# Patient Record
Sex: Female | Born: 1945 | Race: White | Hispanic: No | Marital: Married | Smoking: Never smoker
Health system: Southern US, Community
[De-identification: ages and names within clinical notes are randomized; demographics above are authoritative.]

## PROBLEM LIST (undated history)

## (undated) DIAGNOSIS — I10 Essential (primary) hypertension: Secondary | ICD-10-CM

## (undated) DIAGNOSIS — I480 Paroxysmal atrial fibrillation: Secondary | ICD-10-CM

## (undated) DIAGNOSIS — N183 Chronic kidney disease, stage 3 unspecified: Secondary | ICD-10-CM

## (undated) DIAGNOSIS — C50919 Malignant neoplasm of unspecified site of unspecified female breast: Secondary | ICD-10-CM

## (undated) DIAGNOSIS — F419 Anxiety disorder, unspecified: Secondary | ICD-10-CM

## (undated) DIAGNOSIS — E7800 Pure hypercholesterolemia, unspecified: Secondary | ICD-10-CM

## (undated) DIAGNOSIS — R4189 Other symptoms and signs involving cognitive functions and awareness: Secondary | ICD-10-CM

## (undated) DIAGNOSIS — F32A Depression, unspecified: Secondary | ICD-10-CM

## (undated) HISTORY — DX: Essential (primary) hypertension: I10

## (undated) HISTORY — DX: Malignant neoplasm of unspecified site of unspecified female breast: C50.919

## (undated) HISTORY — DX: Chronic kidney disease, stage 3 (moderate): N18.3

## (undated) HISTORY — DX: Anxiety disorder, unspecified: F41.9

## (undated) HISTORY — DX: Chronic kidney disease, stage 3 unspecified: N18.30

## (undated) HISTORY — DX: Pure hypercholesterolemia, unspecified: E78.00

## (undated) HISTORY — PX: ANKLE SURGERY: SHX546

## (undated) HISTORY — PX: COLONOSCOPY: SHX174

## (undated) HISTORY — DX: Other symptoms and signs involving cognitive functions and awareness: R41.89

## (undated) HISTORY — DX: Paroxysmal atrial fibrillation: I48.0

---

## 1978-06-28 HISTORY — PX: CHOLECYSTECTOMY: SHX55

## 1983-06-29 HISTORY — PX: ABDOMINAL HYSTERECTOMY: SHX81

## 1995-06-29 DIAGNOSIS — C50919 Malignant neoplasm of unspecified site of unspecified female breast: Secondary | ICD-10-CM

## 1995-06-29 HISTORY — DX: Malignant neoplasm of unspecified site of unspecified female breast: C50.919

## 1995-06-29 HISTORY — PX: BREAST SURGERY: SHX581

## 1998-11-12 ENCOUNTER — Other Ambulatory Visit: Admission: RE | Admit: 1998-11-12 | Discharge: 1998-11-12 | Payer: Self-pay | Admitting: Gynecology

## 2000-01-13 ENCOUNTER — Other Ambulatory Visit: Admission: RE | Admit: 2000-01-13 | Discharge: 2000-01-13 | Payer: Self-pay | Admitting: Gynecology

## 2001-01-18 ENCOUNTER — Other Ambulatory Visit: Admission: RE | Admit: 2001-01-18 | Discharge: 2001-01-18 | Payer: Self-pay | Admitting: Gynecology

## 2001-04-05 ENCOUNTER — Ambulatory Visit (HOSPITAL_COMMUNITY): Admission: RE | Admit: 2001-04-05 | Discharge: 2001-04-05 | Payer: Self-pay | Admitting: Gynecology

## 2001-04-05 ENCOUNTER — Encounter: Payer: Self-pay | Admitting: Gynecology

## 2001-04-13 ENCOUNTER — Ambulatory Visit (HOSPITAL_COMMUNITY): Admission: RE | Admit: 2001-04-13 | Discharge: 2001-04-13 | Payer: Self-pay | Admitting: Gynecology

## 2001-04-13 ENCOUNTER — Encounter: Payer: Self-pay | Admitting: Gynecology

## 2001-09-13 ENCOUNTER — Encounter: Payer: Self-pay | Admitting: Surgery

## 2001-09-13 ENCOUNTER — Ambulatory Visit (HOSPITAL_COMMUNITY): Admission: RE | Admit: 2001-09-13 | Discharge: 2001-09-13 | Payer: Self-pay | Admitting: Surgery

## 2002-01-30 ENCOUNTER — Other Ambulatory Visit: Admission: RE | Admit: 2002-01-30 | Discharge: 2002-01-30 | Payer: Self-pay | Admitting: Gynecology

## 2002-03-16 ENCOUNTER — Ambulatory Visit (HOSPITAL_COMMUNITY): Admission: RE | Admit: 2002-03-16 | Discharge: 2002-03-16 | Payer: Self-pay | Admitting: Surgery

## 2002-03-16 ENCOUNTER — Encounter: Payer: Self-pay | Admitting: Surgery

## 2003-02-15 ENCOUNTER — Other Ambulatory Visit: Admission: RE | Admit: 2003-02-15 | Discharge: 2003-02-15 | Payer: Self-pay | Admitting: Gynecology

## 2004-03-17 ENCOUNTER — Other Ambulatory Visit: Admission: RE | Admit: 2004-03-17 | Discharge: 2004-03-17 | Payer: Self-pay | Admitting: Gynecology

## 2005-03-18 ENCOUNTER — Other Ambulatory Visit: Admission: RE | Admit: 2005-03-18 | Discharge: 2005-03-18 | Payer: Self-pay | Admitting: Gynecology

## 2006-03-22 ENCOUNTER — Other Ambulatory Visit: Admission: RE | Admit: 2006-03-22 | Discharge: 2006-03-22 | Payer: Self-pay | Admitting: Gynecology

## 2007-04-25 ENCOUNTER — Other Ambulatory Visit: Admission: RE | Admit: 2007-04-25 | Discharge: 2007-04-25 | Payer: Self-pay | Admitting: Gynecology

## 2008-06-06 ENCOUNTER — Encounter: Admission: RE | Admit: 2008-06-06 | Discharge: 2008-06-06 | Payer: Self-pay | Admitting: Internal Medicine

## 2008-06-06 ENCOUNTER — Encounter: Payer: Self-pay | Admitting: Women's Health

## 2008-06-06 ENCOUNTER — Other Ambulatory Visit: Admission: RE | Admit: 2008-06-06 | Discharge: 2008-06-06 | Payer: Self-pay | Admitting: Gynecology

## 2008-06-06 ENCOUNTER — Ambulatory Visit: Payer: Self-pay | Admitting: Women's Health

## 2008-10-14 ENCOUNTER — Ambulatory Visit: Payer: Self-pay | Admitting: Women's Health

## 2009-01-16 ENCOUNTER — Encounter: Payer: Self-pay | Admitting: Cardiology

## 2009-03-21 ENCOUNTER — Encounter: Admission: RE | Admit: 2009-03-21 | Discharge: 2009-03-21 | Payer: Self-pay | Admitting: Internal Medicine

## 2009-06-11 ENCOUNTER — Other Ambulatory Visit: Admission: RE | Admit: 2009-06-11 | Discharge: 2009-06-11 | Payer: Self-pay | Admitting: Gynecology

## 2009-06-11 ENCOUNTER — Ambulatory Visit: Payer: Self-pay | Admitting: Women's Health

## 2010-03-05 ENCOUNTER — Ambulatory Visit: Payer: Self-pay | Admitting: Women's Health

## 2010-04-08 ENCOUNTER — Ambulatory Visit: Payer: Self-pay | Admitting: Women's Health

## 2010-06-12 ENCOUNTER — Ambulatory Visit: Payer: Self-pay | Admitting: Women's Health

## 2011-05-27 ENCOUNTER — Emergency Department (HOSPITAL_COMMUNITY): Payer: Medicare Other

## 2011-05-27 ENCOUNTER — Inpatient Hospital Stay (HOSPITAL_COMMUNITY): Payer: Medicare Other

## 2011-05-27 ENCOUNTER — Inpatient Hospital Stay (HOSPITAL_COMMUNITY)
Admission: EM | Admit: 2011-05-27 | Discharge: 2011-05-31 | DRG: 690 | Disposition: A | Discharge status: Home / Self Care | Payer: Medicare Other | Attending: Family Medicine | Admitting: Family Medicine

## 2011-05-27 ENCOUNTER — Encounter (HOSPITAL_COMMUNITY): Payer: Self-pay | Admitting: *Deleted

## 2011-05-27 DIAGNOSIS — F329 Major depressive disorder, single episode, unspecified: Secondary | ICD-10-CM | POA: Diagnosis present

## 2011-05-27 DIAGNOSIS — E860 Dehydration: Secondary | ICD-10-CM

## 2011-05-27 DIAGNOSIS — Z853 Personal history of malignant neoplasm of breast: Secondary | ICD-10-CM

## 2011-05-27 DIAGNOSIS — N12 Tubulo-interstitial nephritis, not specified as acute or chronic: Principal | ICD-10-CM | POA: Diagnosis present

## 2011-05-27 DIAGNOSIS — N179 Acute kidney failure, unspecified: Secondary | ICD-10-CM | POA: Diagnosis present

## 2011-05-27 DIAGNOSIS — R51 Headache: Secondary | ICD-10-CM | POA: Diagnosis present

## 2011-05-27 DIAGNOSIS — K5900 Constipation, unspecified: Secondary | ICD-10-CM | POA: Diagnosis present

## 2011-05-27 DIAGNOSIS — E119 Type 2 diabetes mellitus without complications: Secondary | ICD-10-CM | POA: Diagnosis present

## 2011-05-27 DIAGNOSIS — E876 Hypokalemia: Secondary | ICD-10-CM

## 2011-05-27 DIAGNOSIS — F3289 Other specified depressive episodes: Secondary | ICD-10-CM | POA: Diagnosis present

## 2011-05-27 DIAGNOSIS — R112 Nausea with vomiting, unspecified: Secondary | ICD-10-CM

## 2011-05-27 DIAGNOSIS — I10 Essential (primary) hypertension: Secondary | ICD-10-CM | POA: Diagnosis present

## 2011-05-27 DIAGNOSIS — E785 Hyperlipidemia, unspecified: Secondary | ICD-10-CM | POA: Diagnosis present

## 2011-05-27 DIAGNOSIS — A498 Other bacterial infections of unspecified site: Secondary | ICD-10-CM | POA: Diagnosis present

## 2011-05-27 DIAGNOSIS — R42 Dizziness and giddiness: Secondary | ICD-10-CM | POA: Diagnosis present

## 2011-05-27 DIAGNOSIS — N390 Urinary tract infection, site not specified: Secondary | ICD-10-CM

## 2011-05-27 DIAGNOSIS — Z23 Encounter for immunization: Secondary | ICD-10-CM

## 2011-05-27 DIAGNOSIS — F411 Generalized anxiety disorder: Secondary | ICD-10-CM | POA: Diagnosis present

## 2011-05-27 LAB — URINALYSIS, ROUTINE W REFLEX MICROSCOPIC
Nitrite: NEGATIVE
Specific Gravity, Urine: 1.008 (ref 1.005–1.030)
Urobilinogen, UA: 0.2 mg/dL (ref 0.0–1.0)
pH: 5.5 (ref 5.0–8.0)

## 2011-05-27 LAB — CBC
MCH: 30 pg (ref 26.0–34.0)
Platelets: 513 10*3/uL — ABNORMAL HIGH (ref 150–400)
RBC: 3 MIL/uL — ABNORMAL LOW (ref 3.87–5.11)
RDW: 13.7 % (ref 11.5–15.5)
WBC: 22.8 10*3/uL — ABNORMAL HIGH (ref 4.0–10.5)

## 2011-05-27 LAB — DIFFERENTIAL
Basophils Absolute: 0.2 10*3/uL — ABNORMAL HIGH (ref 0.0–0.1)
Eosinophils Relative: 1 % (ref 0–5)
Lymphs Abs: 1.1 10*3/uL (ref 0.7–4.0)
Monocytes Absolute: 1.4 10*3/uL — ABNORMAL HIGH (ref 0.1–1.0)
Monocytes Relative: 6 % (ref 3–12)
Neutrophils Relative %: 87 % — ABNORMAL HIGH (ref 43–77)

## 2011-05-27 LAB — COMPREHENSIVE METABOLIC PANEL
Albumin: 2.3 g/dL — ABNORMAL LOW (ref 3.5–5.2)
Alkaline Phosphatase: 90 U/L (ref 39–117)
BUN: 38 mg/dL — ABNORMAL HIGH (ref 6–23)
Calcium: 8.9 mg/dL (ref 8.4–10.5)
GFR calc Af Amer: 23 mL/min — ABNORMAL LOW (ref >90)
Glucose, Bld: 130 mg/dL — ABNORMAL HIGH (ref 70–99)
Potassium: 3.3 mEq/L — ABNORMAL LOW (ref 3.5–5.1)
Total Protein: 6.7 g/dL (ref 6.0–8.3)

## 2011-05-27 LAB — URINE MICROSCOPIC-ADD ON

## 2011-05-27 LAB — LIPASE, BLOOD: Lipase: 63 U/L — ABNORMAL HIGH (ref 11–59)

## 2011-05-27 LAB — CARDIAC PANEL(CRET KIN+CKTOT+MB+TROPI)
CK, MB: 3.2 ng/mL (ref 0.3–4.0)
Total CK: 91 U/L (ref 7–177)
Troponin I: <0.3 ng/mL (ref <0.30)

## 2011-05-27 MED ORDER — SODIUM CHLORIDE 0.9 % IV BOLUS (SEPSIS): 1000.0000 mL | Freq: Once | INTRAVENOUS | Status: DC

## 2011-05-27 MED ORDER — VITAMIN D3 25 MCG (1000 UNIT) PO TABS
1000.0000 [IU] | ORAL_TABLET | Freq: Every day | ORAL | Status: DC
  Administered 2011-05-28 – 2011-05-31 (×4): 1000 [IU] via ORAL
  Filled 2011-05-27 (×4): qty 1

## 2011-05-27 MED ORDER — CEFTRIAXONE SODIUM 1 G IJ SOLR
1.0000 g | INTRAMUSCULAR | Status: DC
  Administered 2011-05-28 – 2011-05-31 (×4): 1 g via INTRAVENOUS
  Filled 2011-05-27 (×4): qty 10

## 2011-05-27 MED ORDER — ONDANSETRON HCL 4 MG/2ML IJ SOLN
4.0000 mg | Freq: Once | INTRAMUSCULAR | Status: AC
  Administered 2011-05-27: 4 mg via INTRAVENOUS
  Filled 2011-05-27: qty 2

## 2011-05-27 MED ORDER — ZOLPIDEM TARTRATE 5 MG PO TABS
5.0000 mg | ORAL_TABLET | Freq: Every day | ORAL | Status: DC
  Administered 2011-05-27 – 2011-05-30 (×4): 5 mg via ORAL
  Filled 2011-05-27: qty 2
  Filled 2011-05-27 (×3): qty 1

## 2011-05-27 MED ORDER — ONDANSETRON HCL 4 MG/2ML IJ SOLN
4.0000 mg | Freq: Three times a day (TID) | INTRAMUSCULAR | Status: DC | PRN
  Administered 2011-05-27: 4 mg via INTRAVENOUS
  Filled 2011-05-27: qty 2

## 2011-05-27 MED ORDER — ACETAMINOPHEN 325 MG PO TABS
650.0000 mg | ORAL_TABLET | Freq: Four times a day (QID) | ORAL | Status: DC | PRN
  Administered 2011-05-27 – 2011-05-29 (×4): 650 mg via ORAL
  Filled 2011-05-27 (×2): qty 2
  Filled 2011-05-27 (×2): qty 1
  Filled 2011-05-27: qty 2

## 2011-05-27 MED ORDER — VITAMIN D3 25 MCG (1000 UT) PO CAPS: 1.0000 | ORAL_CAPSULE | Freq: Every day | ORAL | Status: DC

## 2011-05-27 MED ORDER — HEPARIN SODIUM (PORCINE) 5000 UNIT/ML IJ SOLN
5000.0000 [IU] | Freq: Three times a day (TID) | INTRAMUSCULAR | Status: DC
  Administered 2011-05-27 – 2011-05-31 (×11): 5000 [IU] via SUBCUTANEOUS
  Filled 2011-05-27 (×14): qty 1

## 2011-05-27 MED ORDER — PANTOPRAZOLE SODIUM 40 MG PO TBEC
40.0000 mg | DELAYED_RELEASE_TABLET | Freq: Every day | ORAL | Status: DC
  Administered 2011-05-28: 40 mg via ORAL
  Filled 2011-05-27: qty 1

## 2011-05-27 MED ORDER — ONDANSETRON HCL 4 MG/2ML IJ SOLN
4.0000 mg | Freq: Four times a day (QID) | INTRAMUSCULAR | Status: DC | PRN
  Administered 2011-05-27 – 2011-05-28 (×3): 4 mg via INTRAVENOUS
  Filled 2011-05-27 (×3): qty 2

## 2011-05-27 MED ORDER — SODIUM CHLORIDE 0.9 % IV SOLN
INTRAVENOUS | Status: DC
  Administered 2011-05-27 – 2011-05-28 (×3): via INTRAVENOUS

## 2011-05-27 MED ORDER — ZOLPIDEM TARTRATE 5 MG PO TABS: 10.0000 mg | ORAL_TABLET | Freq: Every day | ORAL | Status: DC

## 2011-05-27 MED ORDER — HYDROMORPHONE HCL PF 1 MG/ML IJ SOLN
1.0000 mg | INTRAMUSCULAR | Status: DC | PRN
  Administered 2011-05-27: 1 mg via INTRAVENOUS
  Filled 2011-05-27: qty 1

## 2011-05-27 MED ORDER — SODIUM CHLORIDE 0.9 % IV BOLUS (SEPSIS)
1000.0000 mL | Freq: Once | INTRAVENOUS | Status: AC
  Administered 2011-05-27: 1000 mL via INTRAVENOUS

## 2011-05-27 MED ORDER — ONDANSETRON HCL 4 MG PO TABS: 4.0000 mg | ORAL_TABLET | Freq: Four times a day (QID) | ORAL | Status: DC | PRN

## 2011-05-27 MED ORDER — ALPRAZOLAM 0.5 MG PO TABS
0.5000 mg | ORAL_TABLET | Freq: Every evening | ORAL | Status: DC | PRN
  Filled 2011-05-27: qty 1

## 2011-05-27 MED ORDER — MORPHINE SULFATE 2 MG/ML IJ SOLN
2.0000 mg | Freq: Once | INTRAMUSCULAR | Status: AC
  Administered 2011-05-27: 2 mg via INTRAVENOUS
  Filled 2011-05-27: qty 1

## 2011-05-27 MED ORDER — DEXTROSE 5 % IV SOLN
1.0000 g | Freq: Once | INTRAVENOUS | Status: AC
  Administered 2011-05-27: 1 g via INTRAVENOUS
  Filled 2011-05-27: qty 10

## 2011-05-27 MED ORDER — SODIUM CHLORIDE 0.9 % IV BOLUS (SEPSIS)
500.0000 mL | Freq: Once | INTRAVENOUS | Status: AC
  Administered 2011-05-27: 11:00:00 via INTRAVENOUS

## 2011-05-27 MED ORDER — BUPROPION HCL ER (XL) 150 MG PO TB24
150.0000 mg | ORAL_TABLET | Freq: Every day | ORAL | Status: DC
  Administered 2011-05-28 – 2011-05-31 (×4): 150 mg via ORAL
  Filled 2011-05-27 (×4): qty 1

## 2011-05-27 MED ORDER — INSULIN ASPART 100 UNIT/ML ~~LOC~~ SOLN
0.0000 [IU] | Freq: Three times a day (TID) | SUBCUTANEOUS | Status: DC
  Administered 2011-05-28 – 2011-05-31 (×4): 1 [IU] via SUBCUTANEOUS
  Filled 2011-05-27: qty 3

## 2011-05-27 MED ORDER — SODIUM CHLORIDE 0.9 % IV SOLN
INTRAVENOUS | Status: AC
  Administered 2011-05-27: 17:00:00 via INTRAVENOUS

## 2011-05-27 MED ORDER — LORATADINE 10 MG PO TABS
10.0000 mg | ORAL_TABLET | Freq: Every day | ORAL | Status: DC
  Administered 2011-05-28 – 2011-05-31 (×4): 10 mg via ORAL
  Filled 2011-05-27 (×4): qty 1

## 2011-05-27 NOTE — H&P (Signed)
Kathryn Wall is an 65 y.o. female.    Chief Complaint: Abdominal pain, N/V x 10 days  HPI: Kathryn Wall is a 65 yo F with PMH of HTN, DM, HLD, anxiety and breast cancer who presented to the ED for a 10 day history of nausea, vomiting and abdominal pain. Kathryn Wall states this all started on 05/18/11 after eating BBQ. (Kathryn Wall shared the meal with Kathryn and has had no GI symptoms.) Kathryn Wall states when it started, she was vomiting multiple times per day, but that has improved and now she only vomits when she eats or drinks something. She has not been able to tolerate anything PO. She denies dark, bilious or bloody emesis. She also reports a cough that may or may not be associated with the vomiting. It has also been going on for a few weeks and she does have post-tussive emesis from time to time. Kathryn Wall also complains of dizziness for the last few months that is worse with position. She has not had any true syncopal episodes, but does report pre-syncope especially when she "stands up too fast."  For Kathryn current symptoms, Kathryn Wall was seen by Kathryn PCP Dr. [name] at [facility] earlier this week. At that time, she had an elevated WBC, elevated Creat and UA that indicated UTI. She was given fluids in the office and started on Cipro for Kathryn UTI. She still had no improvement and came to the ED for further evaluation.  In the ED after arrival, she was found to be orthostatic. Kathryn HR was in the low 100's. WBC of 23, UA showed + bacteria and therefore she was given one dose of Rocephin. Due to dehydration and 10 days of pain, Family medicine was called for admission. On ROS, she endorsed right sided HA, congestion, sore throat, cough, N/V, abd pain, dysuria and chronic leg pain.   Past Medical History  Diagnosis Date  . Hypertension   . High cholesterol   . Diabetes mellitus     TYPE II  . Breast cancer 1997    AGE 15, BRCA 1 NEGATIVE 2. 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      Past Surgical History  Procedure Date  . Cholecystectomy 1980  . Breast surgery 1997    RIGHT BREAST LUMPECTOMY  . Abdominal hysterectomy 1985    TAH    Family History  Problem Relation Age of Onset  . Cancer Mother     COLON  . Hypertension Father   . Heart disease Father    Social History:  reports that she has never smoked. She does not have any smokeless tobacco history on file. She reports that she drinks alcohol. She reports that she does not use illicit drugs.  Allergies:  Allergies  Allergen Reactions  . Allergen (A-B Ear Drops)   . Cymbalta (Duloxetine Hcl)   . Other     SENSITIVE TO ANTIBIOTICS    Medications Prior to Admission  Medication Dose Route Frequency Provider Last Rate Last Dose  . 0.9 %  sodium chloride infusion   Intravenous STAT Glynn Octave, MD      . cefTRIAXone (ROCEPHIN) 1 g in dextrose 5 % 50 mL IVPB  1 g Intravenous Once Glynn Octave, MD   1 g at 05/27/11 1310  . HYDROmorphone (DILAUDID) injection 1 mg  1 mg Intravenous Q4H PRN Glynn Octave, MD   1 mg at 05/27/11 1630  . morphine 2 MG/ML injection 2 mg  2 mg Intravenous Once Na  Dierdre Searles, MD   2 mg at 05/27/11 1115  . ondansetron (ZOFRAN) injection 4 mg  4 mg Intravenous Once Na Li, MD   4 mg at 05/27/11 1115  . ondansetron (ZOFRAN) injection 4 mg  4 mg Intravenous Q8H PRN Glynn Octave, MD   4 mg at 05/27/11 1630  . sodium chloride 0.9 % bolus 1,000 mL  1,000 mL Intravenous Once Glynn Octave, MD   1,000 mL at 05/27/11 1309  . sodium chloride 0.9 % bolus 500 mL  500 mL Intravenous Once Na Li, MD       Medications Prior to Admission  Medication Sig Dispense Refill  . buPROPion (WELLBUTRIN XL) 150 MG 24 hr tablet Take 150 mg by mouth daily.        . metFORMIN (GLUCOPHAGE) 500 MG tablet Take 500 mg by mouth 2 (two) times daily with a meal.          Results for orders placed during the hospital encounter of 05/27/11 (from the past 48 hour(s))  URINALYSIS, ROUTINE W REFLEX MICROSCOPIC      Status: Abnormal   Collection Time   05/27/11 11:13 AM      Component Value Range Comment   Color, Urine YELLOW  YELLOW     APPearance HAZY (*) CLEAR     Specific Gravity, Urine 1.008  1.005 - 1.030     pH 5.5  5.0 - 8.0     Glucose, UA NEGATIVE  NEGATIVE (mg/dL)    Hgb urine dipstick MODERATE (*) NEGATIVE     Bilirubin Urine NEGATIVE  NEGATIVE     Ketones, ur NEGATIVE  NEGATIVE (mg/dL)    Protein, ur NEGATIVE  NEGATIVE (mg/dL)    Urobilinogen, UA 0.2  0.0 - 1.0 (mg/dL)    Nitrite NEGATIVE  NEGATIVE     Leukocytes, UA SMALL (*) NEGATIVE    URINE MICROSCOPIC-ADD ON     Status: Abnormal   Collection Time   05/27/11 11:13 AM      Component Value Range Comment   Squamous Epithelial / LPF RARE  RARE     WBC, UA 7-10  <3 (WBC/hpf)    RBC / HPF 3-6  <3 (RBC/hpf)    Bacteria, UA FEW (*) RARE     Casts WBC CAST (*) NEGATIVE    CBC     Status: Abnormal   Collection Time   05/27/11 11:46 AM      Component Value Range Comment   WBC 22.8 (*) 4.0 - 10.5 (K/uL)    RBC 3.00 (*) 3.87 - 5.11 (MIL/uL)    Hemoglobin 9.0 (*) 12.0 - 15.0 (g/dL)    HCT 16.1 (*) 09.6 - 46.0 (%)    MCV 86.0  78.0 - 100.0 (fL)    MCH 30.0  26.0 - 34.0 (pg)    MCHC 34.9  30.0 - 36.0 (g/dL)    RDW 04.5  40.9 - 81.1 (%)    Platelets 513 (*) 150 - 400 (K/uL)   DIFFERENTIAL     Status: Abnormal   Collection Time   05/27/11 11:46 AM      Component Value Range Comment   Neutrophils Relative 87 (*) 43 - 77 (%)    Lymphocytes Relative 5 (*) 12 - 46 (%)    Monocytes Relative 6  3 - 12 (%)    Eosinophils Relative 1  0 - 5 (%)    Basophils Relative 1  0 - 1 (%)    Neutro Abs 19.9 (*) 1.7 -  7.7 (K/uL)    Lymphs Abs 1.1  0.7 - 4.0 (K/uL)    Monocytes Absolute 1.4 (*) 0.1 - 1.0 (K/uL)    Eosinophils Absolute 0.2  0.0 - 0.7 (K/uL)    Basophils Absolute 0.2 (*) 0.0 - 0.1 (K/uL)    RBC Morphology TARGET CELLS      WBC Morphology TOXIC GRANULATION   MILD LEFT SHIFT (1-5% METAS, OCC MYELO, OCC BANDS)   Smear Review LARGE  PLATELETS PRESENT     COMPREHENSIVE METABOLIC PANEL     Status: Abnormal   Collection Time   05/27/11 11:46 AM      Component Value Range Comment   Sodium 131 (*) 135 - 145 (mEq/L)    Potassium 3.3 (*) 3.5 - 5.1 (mEq/L)    Chloride 95 (*) 96 - 112 (mEq/L)    CO2 20  19 - 32 (mEq/L)    Glucose, Bld 130 (*) 70 - 99 (mg/dL)    BUN 38 (*) 6 - 23 (mg/dL)    Creatinine, Ser 9.14 (*) 0.50 - 1.10 (mg/dL)    Calcium 8.9  8.4 - 10.5 (mg/dL)    Total Protein 6.7  6.0 - 8.3 (g/dL)    Albumin 2.3 (*) 3.5 - 5.2 (g/dL)    AST 26  0 - 37 (U/L)    ALT 32  0 - 35 (U/L)    Alkaline Phosphatase 90  39 - 117 (U/L)    Total Bilirubin 0.6  0.3 - 1.2 (mg/dL)    GFR calc non Af Amer 20 (*) >90 (mL/min)    GFR calc Af Amer 23 (*) >90 (mL/min)   LIPASE, BLOOD     Status: Abnormal   Collection Time   05/27/11 11:46 AM      Component Value Range Comment   Lipase 63 (*) 11 - 59 (U/L)   CARDIAC PANEL(CRET KIN+CKTOT+MB+TROPI)     Status: Normal   Collection Time   05/27/11 11:49 AM      Component Value Range Comment   Total CK 91  7 - 177 (U/L)    CK, MB 3.2  0.3 - 4.0 (ng/mL)    Troponin I <0.30  <0.30 (ng/mL)    Relative Index RELATIVE INDEX IS INVALID  0.0 - 2.5    LACTIC ACID, PLASMA     Status: Normal   Collection Time   05/27/11 11:51 AM      Component Value Range Comment   Lactic Acid, Venous 0.9  0.5 - 2.2 (mmol/L)    Ct Abdomen Pelvis Wo Contrast  05/27/2011  *RADIOLOGY REPORT*  Clinical Data: Abnormal labs, cramping, nausea, vomiting, shortness of breath, productive cough, dizziness, recent diagnosis of UTI; past history of hypertension, diabetes, hypercholesterolemia, breast cancer  CT ABDOMEN AND PELVIS WITHOUT CONTRAST  Technique:  Multidetector CT imaging of the abdomen and pelvis was performed following the standard protocol without intravenous contrast. Sagittal and coronal MPR images reconstructed from axial data set.  Comparison: 01/07/2006  Findings: Minimal dependent atelectasis at lung  bases. Low attenuation of circulating blood question anemia. Kidneys appear enlarged with thickened cortex versus prior study. Minimal perinephric edema particulate on the right. Within limits of a nonenhanced exam, no additional abnormalities of the liver, spleen, pancreas, kidneys, or adrenal glands. Two splenules adjacent to splenic hilum, stable. Normal caliber ureters without calcification.  Unremarkable bladder. Uterus surgically absent with normal sized ovaries. Scattered uncomplicated colonic diverticula. Appendix not visualized. Stomach and bowel loops otherwise grossly normal appearance. No mass, adenopathy, free fluid  or hernia. Degenerative disc disease changes L3-L4 without acute osseous findings.  IMPRESSION: Bilateral renal cortical thickening and minimal perihilar edema raising question of urinary tract infection/pyelonephritis; recommend correlation with urinalysis. Question anemia. No additional significant intra abdominal or intrapelvic abnormalities.  Original Report Authenticated By: Lollie Marrow, M.D.   Dg Chest 2 View  05/27/2011  *RADIOLOGY REPORT*  Clinical Data: Nausea, vomiting, abdominal pain.  CHEST - 2 VIEW  Comparison: 06/06/2008  Findings: Heart is normal size.  Lungs are clear.  No effusions or acute bony abnormality.  Slight peribronchial thickening.  IMPRESSION: Slight bronchitic changes.  Original Report Authenticated By: Cyndie Chime, M.D.    Review of Systems  Constitutional: Positive for fever, chills and weight loss.  HENT: Positive for congestion and sore throat.   Respiratory: Positive for cough. Negative for hemoptysis and sputum production.   Cardiovascular: Negative for chest pain.  Gastrointestinal: Positive for nausea, vomiting and abdominal pain.  Genitourinary: Positive for dysuria and frequency.  Musculoskeletal: Negative for myalgias.  Skin: Negative for rash.  Neurological: Positive for dizziness, weakness and headaches.  Psychiatric/Behavioral:  The Kathryn Wall is nervous/anxious.   All other systems reviewed and are negative.    Blood pressure 130/86, pulse 94, temperature 99.8 F (37.7 C), temperature source Oral, resp. rate 18, SpO2 97.00%. Physical Exam  Gen: NAD Psych: fully alert and oriented, engaged, appropriate HEENT   Eyes: normal   Nose: no rhinorrhea or congestion   Oropharynx: no lesions, dry MM   Neck: no LAD CV: RRR, no m/r/g Pulm: CTAB, no w/r/r, occasional coarse breath sound right lower lobe that resolves with cough Abd: NABS, soft, ND, mild tenderness without guarding or rebound periumbilical region Back: no spinal tenderness or CVA tenderness Ext: no swelling, tenderness, erythema Skin: 3-4 sec capillary refill  Assessment/Plan Kathryn Wall is a 65 yo F with PMH of HTN, DM, HLD, anxiety and breast cancer who presented to the ED for a 10 day history of nausea, vomiting and abdominal pain.   Will admit to floor bed for observation.   Nausea/vomiting/abdominal pain CT abdomen unremarkable but Kathryn Wall does have elevated WBC along with Kathryn symptoms. -May be due to questionable UTI. No UCx done. Will get now although has been on ciprofloxacin x 3 days and received dose of CTZ in ED. -Symptoms likely started from gastroenteritis. Lipase mildly elevated. Will repeat in the AM.  -Will check CT head to evaluate for bleed to nausea/vomiting/lightheadedness/headache  Headache -Will f/u CT head -Tylenol prn  Lightheadedness Positive orthostatics. Likely due to dehydration currently although has been complaining of this for the past 2 years. -Received 1.5 L bolus in the ED. Still appears dry. Will give 1 L NS now and continue fluids at maintenance -Will repeat orthostatics once improved  UTI Diagnosed at PCP, started on Cipro. -Rocephin x1 in ED. Will continue -Awaiting gram stain and urine culture  Cough CXR shows no pneumonia, no productive cough. But WBC concerning. -Will repeat CXR in the AM  HTN -Will hold  home ARB and ACEi for now due to positive orthostatics and elevated Cr  AKI -Likely due to dehydration. Will give fluids.  DM -Will hold metformin for now.  -SSI  Psych -We will continue home Xanax qhs, Wellbutrin, Ambien qhs   HLD -Will hold fenofibrate and Zocor for now  FEN/GI -Will give bolus and maintenance fluids -Will give clears. Zofran prn.   Dispo Pending clinical improvement.  Code FULL   Sherriann Szuch 05/27/2011, 4:34 PM  Etta Quill.  Madolyn Frieze, PGY2

## 2011-05-27 NOTE — ED Notes (Signed)
Patient c/o nausea vomiting onset last Weds.  Was seen by her pvt. MD on Wed had labs drawn and started on Cipro for UTI, patient continues to complaint of nausea and vomiting was seen in the office today for follow up and was told to come to the ed for further eval for abnormal labs Elevated Bun. Cr. And wbc.

## 2011-05-27 NOTE — ED Provider Notes (Signed)
I saw and evaluated the patient, reviewed the resident's note and I agree with the findings and plan.  10 days of nausea, vomiting, vague abdominal discomfort.  No diarrhea.  Abdomen soft. Mild L sided tenderness.  Recently treated for UTI  Glynn Octave, MD 05/27/11 1320

## 2011-05-27 NOTE — ED Notes (Signed)
Date: 05/27/2011  Rate: 94  Rhythm: normal sinus rhythm  QRS Axis: normal  Intervals: normal  ST/T Wave abnormalities: normal  Conduction Disutrbances:none  Narrative Interpretation:   Old EKG Reviewed: none available    Glynn Octave, MD 05/27/11 1351

## 2011-05-27 NOTE — ED Notes (Signed)
md at bedside.  No distress noted at present.

## 2011-05-27 NOTE — ED Notes (Signed)
Pt states has had n/v cough and abd soreness for several days, states has been treated for uti by her Md.

## 2011-05-27 NOTE — Progress Notes (Signed)
FPTS Attending Note  I interviewed and examined [name] and reviewed their tests and x-rays.  I discussed with Drs.  Oh Park and Erwin and reviewed their note for today.  I agree with their assessment and plan.     Most consistent with incompletely treated UTI with ow normal abdomen CT and chest xray findings.  Treat with broader spectrum antibiotics reculture urine and gram stain.   Will follow for resolution of other symptoms.   Elevated crt likely due to dehydration.  Hydrate and monitor  Kathryn Wall L

## 2011-05-27 NOTE — ED Provider Notes (Signed)
History     CSN: [id number] Arrival date & time: 05/27/2011 10:12 AM   None     Chief Complaint  Patient presents with  . Abnormal Lab    (Consider location/radiation/quality/duration/timing/severity/associated sxs/prior treatment) HPI  This is a 65 year old female with PMH of HTN, DM type II, breast cancer with lumpectomy and radiation, TAH, and Cholecystectomy who presents to the ED with vomiting. The history is provided by patient. Patient reports that she started to have intermittent nausea and vomiting 10 days ago after she ate some barbecue with family, stomach content without hemoemesis, accompanied with generalized abdominal aching and cramping. Denies diarrhea.  She is the only one who is sick and her other family members are ok. She also report some nonproductive cough and mild shortness of breath associated with her vomiting.  Pt states that she felt dizziness and fell 4-5 times last week without serious injury, denies seizure activities or LOC. She went to see her PCP 3 days ago for evaluation. She was given 2 liters of IV fluid and treated with Cipro after Positive UTI noted on her UA.  She states that her symptoms are not getting better, and She continues to have intermittent abdominal cramping, nausea and vomiting. Patient reports one episode of fever 101.0 last week. Denies sick contact or recent travel. Denies headache or sore throat. No chest pain, chest pressure or palpitation. No melena, diarrhea or incontinence. No muscle weakness.                    Denies depression. No appetite or weight changes.   Of note, patient states that she has had intermittent nausea and vomiting in the past whenever she is stressful. And she has some family and financial stress for one year and is really stressful lately.   Past Medical History  Diagnosis Date  . Hypertension   . High cholesterol   . Diabetes mellitus     TYPE II  . Breast cancer 1997    AGE 26, BRCA 1 NEGATIVE 2.  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     Past Surgical History  Procedure Date  . Cholecystectomy 1980  . Breast surgery 1997    RIGHT BREAST LUMPECTOMY  . Abdominal hysterectomy 1985    TAH    Family History  Problem Relation Age of Onset  . Cancer Mother     COLON  . Hypertension Father   . Heart disease Father     History  Substance Use Topics  . Smoking status: Never Smoker   . Smokeless tobacco: Not on file  . Alcohol Use: Yes    OB History    Grav Para Term Preterm Abortions TAB SAB Ect Mult Living                  Review of Systems  see HPI  Allergies  Allergen; Cymbalta; and Other  Home Medications   Current Outpatient Rx  Name Route Sig Dispense Refill  . ALPRAZOLAM 0.5 MG PO TABS Oral Take 0.5 mg by mouth at bedtime as needed.      Marland Kitchen CIPROFLOXACIN HCL 500 MG PO TABS Oral Take 500 mg by mouth 2 (two) times daily.      Marland Kitchen ESCITALOPRAM OXALATE 20 MG PO TABS Oral Take 20 mg by mouth daily.      Marland Kitchen PANTOPRAZOLE SODIUM 40 MG PO TBEC Oral Take 40 mg by mouth daily.      Marland Kitchen SIMVASTATIN 40  MG PO TABS Oral Take 40 mg by mouth at bedtime.      . ASPIRIN 81 MG PO TABS Oral Take 81 mg by mouth daily.      . BUPROPION HCL ER (XL) 150 MG PO TB24 Oral Take 150 mg by mouth daily.      . STOOL SOFTENER PO Oral Take by mouth as needed.      . TRICOR PO Oral Take 145 mg by mouth.     Marland Kitchen HYDROCHLOROTHIAZIDE 25 MG PO TABS Oral Take 25 mg by mouth daily.      Marland Kitchen METFORMIN HCL 500 MG PO TABS Oral Take 500 mg by mouth 2 (two) times daily with a meal.      . ZOLPIDEM TARTRATE ER 12.5 MG PO TBCR Oral Take 12.5 mg by mouth at bedtime as needed.        BP 122/74  Pulse 104  Temp(Src) 99.8 F (37.7 C) (Oral)  Resp 20  SpO2 94%  Physical Exam General: NAD. alert, well-developed, and cooperative to examination.  Head: normocephalic and atraumatic.  Eyes: vision grossly intact, pupils equal, pupils round, pupils reactive to light, no injection and anicteric.    Mouth: pharynx pink and dry, no erythema, and no exudates. Mucous membrane dry Neck: supple, full ROM, no thyromegaly, no JVD, and no carotid bruits.  Lungs: normal respiratory effort, no accessory muscle use, normal breath sounds, no crackles, and no wheezes. Heart: normal rate, regular rhythm, no murmur, no gallop, and no rub.  Abdomen: soft, normal bowel sounds, no distention. Epigastric area tenderness noted, no guarding, no rebound tenderness, no hepatomegaly, and no splenomegaly.  Msk: no joint swelling, no joint warmth, and no redness over joints.  Pulses: 2+ DP/PT pulses bilaterally Extremities: No cyanosis, clubbing, edema Neurologic: alert & oriented X3, cranial nerves II-XII intact, strength normal in all extremities, sensation intact to light touch, and gait normal.  Skin: turgor normal and no rashes.  Psych: Oriented X3, memory intact for recent and remote, normally interactive, good eye contact, not anxious appearing, and not depressed appearing.   ED Course  Procedures (including critical care time)  Date: 05/27/2011  Rate: 94  Rhythm: normal sinus rhythm  QRS Axis: normal  Intervals: normal  ST/T Wave abnormalities: normal  Conduction Disutrbances:none  Narrative Interpretation:   Old EKG Reviewed: none available     MDM  1. Vomiting and abdominal pain. Pt presents to the ED with 10 days of intermittent nausea, vomiting and abdominal pain. Denies diarrhea. Recent UTI diagnosed and treated with Cipro 3 days ago.  - will check her cardiac markers given her multiple risk factors for CAD - will check her lipase function - will treat her symptoms.        Dede Query, MD 05/27/11 1139  Dede Query, MD 05/27/11 1243

## 2011-05-28 ENCOUNTER — Inpatient Hospital Stay (HOSPITAL_COMMUNITY): Payer: Medicare Other

## 2011-05-28 ENCOUNTER — Other Ambulatory Visit: Payer: Self-pay

## 2011-05-28 LAB — BASIC METABOLIC PANEL
CO2: 22 mEq/L (ref 19–32)
Calcium: 8.1 mg/dL — ABNORMAL LOW (ref 8.4–10.5)
Creatinine, Ser: 2.07 mg/dL — ABNORMAL HIGH (ref 0.50–1.10)
GFR calc Af Amer: 27 mL/min — ABNORMAL LOW (ref >90)
GFR calc non Af Amer: 24 mL/min — ABNORMAL LOW (ref >90)
Sodium: 136 mEq/L (ref 135–145)

## 2011-05-28 LAB — CBC
Platelets: 478 10*3/uL — ABNORMAL HIGH (ref 150–400)
RBC: 2.5 MIL/uL — ABNORMAL LOW (ref 3.87–5.11)
RDW: 14.1 % (ref 11.5–15.5)
WBC: 15.6 10*3/uL — ABNORMAL HIGH (ref 4.0–10.5)

## 2011-05-28 LAB — SODIUM, URINE, RANDOM: Sodium, Ur: 96 mEq/L

## 2011-05-28 LAB — GLUCOSE, CAPILLARY
Glucose-Capillary: 125 mg/dL — ABNORMAL HIGH (ref 70–99)
Glucose-Capillary: 111 mg/dL — ABNORMAL HIGH (ref 70–99)
Glucose-Capillary: 103 mg/dL — ABNORMAL HIGH (ref 70–99)

## 2011-05-28 LAB — TSH: TSH: 3.178 u[IU]/mL (ref 0.350–4.500)

## 2011-05-28 LAB — LIPASE, BLOOD: Lipase: 56 U/L (ref 11–59)

## 2011-05-28 LAB — HEMOGLOBIN A1C
Hgb A1c MFr Bld: 6.5 % — ABNORMAL HIGH (ref <5.7)
Mean Plasma Glucose: 140 mg/dL — ABNORMAL HIGH (ref <117)

## 2011-05-28 LAB — CREATININE, URINE, RANDOM: Creatinine, Urine: 23.13 mg/dL

## 2011-05-28 MED ORDER — PROMETHAZINE HCL 25 MG/ML IJ SOLN
25.0000 mg | Freq: Four times a day (QID) | INTRAMUSCULAR | Status: DC | PRN
  Administered 2011-05-28 – 2011-05-29 (×6): 25 mg via INTRAVENOUS
  Filled 2011-05-28 (×6): qty 1

## 2011-05-28 MED ORDER — ALPRAZOLAM 0.5 MG PO TABS
0.5000 mg | ORAL_TABLET | Freq: Two times a day (BID) | ORAL | Status: DC | PRN
  Administered 2011-05-28 – 2011-05-31 (×2): 0.5 mg via ORAL
  Filled 2011-05-28: qty 1

## 2011-05-28 MED ORDER — GLYCERIN (LAXATIVE) 2.1 G RE SUPP
1.0000 | Freq: Every day | RECTAL | Status: DC | PRN
  Filled 2011-05-28: qty 1

## 2011-05-28 MED ORDER — ALPRAZOLAM 0.25 MG PO TABS: 0.2500 mg | ORAL_TABLET | Freq: Every day | ORAL | Status: DC | PRN

## 2011-05-28 MED ORDER — MORPHINE SULFATE 2 MG/ML IJ SOLN
2.0000 mg | Freq: Once | INTRAMUSCULAR | Status: AC
  Administered 2011-05-28: 2 mg via INTRAVENOUS
  Filled 2011-05-28: qty 1

## 2011-05-28 NOTE — Progress Notes (Signed)
FMTS Attending Daily Note: Jex Strausbaugh MD [phone] pager office [phone] I have discussed this patient with the resident and reviewed the assessment and plan as documented above. I agree wit the resident's findings and plan.  

## 2011-05-28 NOTE — H&P (Signed)
Family Medicine Teaching Service Attending Note  I interviewed and examined patient  Kathryn Wall and reviewed their tests and x-rays.  I discussed with Dr.  Madolyn Frieze and [facility] and reviewed their notes.  I agree with their assessment and plan.

## 2011-05-28 NOTE — Progress Notes (Signed)
PGY-1 Daily Progress Note Family Medicine Teaching Service Johnae Friley M. Uno Esau, MD Service Pager: [phone]  Subjective: Patient continues to have N/V and abdominal pain. States that it has not improved with Zofran. Little subjective improvement in clinical status, per patient.  Objective: Vital signs in last 24 hours: Temp:  [97.8 F (36.6 C)-99.8 F (37.7 C)] 98.2 F (36.8 C) (11/30 0439) Pulse Rate:  [79-104] 96  (11/30 0439) Resp:  [13-20] 17  (11/30 0439) BP: (122-136)/(70-93) 136/85 mmHg (11/30 0439) SpO2:  [92 %-97 %] 96 % (11/30 0439) Weight:  [146 lb 1.6 oz (66.271 kg)] 146 lb 1.6 oz (66.271 kg) (11/29 1751) Weight change:  Last BM Date: 05/27/11  Intake/Output from previous day: 11/29 0701 - 11/30 0700 In: 1817.5 [P.O.:480; I.V.:1337.5] Out: 100 [Urine:100] Intake/Output this shift:    General appearance: Lying in bed, talkative, appears uncomfortable Head: AT, [place] Resp: Good effort, CTAB Cardio: RRR, No MRG GI: Protuberant abdomen, mild tenderness (periumbilical to LUQ), soft Extremities: Moves all extremities Neurologic: Grossly intact  Lab Results:  Basename 05/28/11 0550 05/27/11 1146  WBC 15.6* 22.8*  HGB 7.5* 9.0*  HCT 21.8* 25.8*  PLT 478* 513*   BMET  Basename 05/28/11 0550 05/27/11 1146  NA 136 131*  K 3.1* 3.3*  CL 103 95*  CO2 22 20  GLUCOSE 120* 130*  BUN 30* 38*  CREATININE 2.07* 2.38*  CALCIUM 8.1* 8.9    Studies/Results: Ct Abdomen Pelvis Wo Contrast  05/27/2011  *RADIOLOGY REPORT*  Clinical Data: Abnormal labs, cramping, nausea, vomiting, shortness of breath, productive cough, dizziness, recent diagnosis of UTI; past history of hypertension, diabetes, hypercholesterolemia, breast cancer  CT ABDOMEN AND PELVIS WITHOUT CONTRAST  Technique:  Multidetector CT imaging of the abdomen and pelvis was performed following the standard protocol without intravenous contrast. Sagittal and coronal MPR images reconstructed from axial data set.   Comparison: 01/07/2006  Findings: Minimal dependent atelectasis at lung bases. Low attenuation of circulating blood question anemia. Kidneys appear enlarged with thickened cortex versus prior study. Minimal perinephric edema particulate on the right. Within limits of a nonenhanced exam, no additional abnormalities of the liver, spleen, pancreas, kidneys, or adrenal glands. Two splenules adjacent to splenic hilum, stable. Normal caliber ureters without calcification.  Unremarkable bladder. Uterus surgically absent with normal sized ovaries. Scattered uncomplicated colonic diverticula. Appendix not visualized. Stomach and bowel loops otherwise grossly normal appearance. No mass, adenopathy, free fluid or hernia. Degenerative disc disease changes L3-L4 without acute osseous findings.  IMPRESSION: Bilateral renal cortical thickening and minimal perihilar edema raising question of urinary tract infection/pyelonephritis; recommend correlation with urinalysis. Question anemia. No additional significant intra abdominal or intrapelvic abnormalities.  Original Report Authenticated By: Lollie Marrow, M.D.   Dg Chest 2 View  05/27/2011  *RADIOLOGY REPORT*  Clinical Data: Nausea, vomiting, abdominal pain.  CHEST - 2 VIEW  Comparison: 06/06/2008  Findings: Heart is normal size.  Lungs are clear.  No effusions or acute bony abnormality.  Slight peribronchial thickening.  IMPRESSION: Slight bronchitic changes.  Original Report Authenticated By: Cyndie Chime, M.D.   Ct Head Wo Contrast  05/27/2011  *RADIOLOGY REPORT*  Clinical Data: Headache, dizziness and vomiting; history of breast cancer.  CT HEAD WITHOUT CONTRAST  Technique:  Contiguous axial images were obtained from the base of the skull through the vertex without contrast.  Comparison: None.  Findings: There is no evidence of acute infarction, mass lesion, or intra- or extra-axial hemorrhage on CT.  Minimal periventricular white matter change likely reflects small  vessel ischemic microangiopathy.  Mild cerebellar atrophy is noted.  The brainstem and fourth ventricle are within normal limits.  The third and lateral ventricles, and basal ganglia are unremarkable in appearance.  The cerebral hemispheres are symmetric in appearance, with normal gray-white differentiation.  No mass effect or midline shift is seen.  There is no evidence of fracture; visualized osseous structures are unremarkable in appearance.  The visualized portions of the orbits are within normal limits.  The paranasal sinuses and mastoid air cells are well-aerated.  No significant soft tissue abnormalities are seen.  IMPRESSION:  1.  No acute intracranial pathology seen on CT. 2.  Minimal small vessel ischemic microangiopathy.  Original Report Authenticated By: Tonia Ghent, M.D.   Dg Chest Port 1 View  05/28/2011  *RADIOLOGY REPORT*  Clinical Data: Headache.  Cough.  Shortness of breath  PORTABLE CHEST - 1 VIEW  Comparison: 1 day prior  Findings: Midline trachea.  Normal heart size.  Borderline right paratracheal soft tissue fullness is similar back to 2009 and therefore likely secondary to great vessels. No pleural effusion or pneumothorax.  Low lung volumes with resultant pulmonary interstitial prominence.  Diffuse peribronchial thickening.  IMPRESSION:  1. No acute cardiopulmonary disease. 2. Mild interstitial thickening.  Question smoking or chronic bronchitis.  Original Report Authenticated By: Consuello Bossier, M.D.    Medications:  I have reviewed the patient's current medications. Scheduled:   . sodium chloride   Intravenous STAT  . buPROPion  150 mg Oral Daily  . cefTRIAXone (ROCEPHIN)  IV  1 g Intravenous Q24H  . cholecalciferol  1,000 Units Oral Daily  . heparin  5,000 Units Subcutaneous Q8H  . insulin aspart  0-9 Units Subcutaneous TID WC  . loratadine  10 mg Oral Daily  .  morphine injection  2 mg Intravenous Once  . pantoprazole  40 mg Oral Daily  . sodium chloride  1,000 mL  Intravenous Once  . sodium chloride  1,000 mL Intravenous Once  . zolpidem  5 mg Oral QHS  . DISCONTD: Vitamin D3  1 capsule Oral Daily  . DISCONTD: zolpidem  10 mg Oral QHS   Continuous:   . sodium chloride 125 mL/hr at 05/28/11 1140   ZOX:[id number], ALPRAZolam, ALPRAZolam, ondansetron (ZOFRAN) IV, ondansetron, promethazine, DISCONTD:  HYDROmorphone (DILAUDID) injection, DISCONTD: ondansetron (ZOFRAN) IV  Assessment/Plan: Patient is a 65 yo F with PMH of HTN, DM, HLD, anxiety and breast cancer who presented to the ED for a 10 day history of nausea, vomiting and abdominal pain.   1. Nausea/vomiting/abdominal pain:  Little improvement subjectively. Patient continues to have nausea and vomiting - Abdominal CT unremarkable, head CT unremarkable - May be due to questionable UTI. Ucx pending. Continue Ceftraixone (see #4 below) - Symptoms likely started from gastroenteritis.Lipase and WBC trending down. - Will check CT head to evaluate for bleed to nausea/vomiting/lightheadedness/headache  - Patient does endorse an anxiety component to her pain. Will give Xanax 0.5 BID as needed, as well as her nighttime dose. - Some stool noted on CT. Will discuss with patient if she would like an enema vs suppository to help with constipation - Zofran not helping nausea. Will give Phenergan IV. - Currently having pain, will give Morphine x1 now and continue to monitor.  2. Headache Right sided headache behind eye. Unchanged. - Head CT negative -Tylenol prn   3. Lightheadedness Positive orthostatics in ED, but resolved after fluids - Continue fluids - Will get PT consult prior to discharge if still symptomatic  4.UTI Diagnosed at PCP, started on Cipro and received Rocephin x1 in ED. Will continue  -Awaiting gram stain and urine culture   5. Cough CXR on admissionshows no pneumonia, no productive cough.  -WBC trending down on Rocephin -Repeat CXR unremarkable  6. HTN -Stable for now  -Will  hold home ARB and ACEi for now due to positive orthostatics and elevated Cr   7.AKI  -Likely due to dehydration. Creat improved to 2.06 today - Continue fluids -Repeat Bmet in the AM  8. DM  - HgB A1C 6.5 -Will hold metformin for now.  -SSI   9. Psych  -We will continue home Xanax qhs, Wellbutrin, Ambien qhs   10. HLD  -Will hold fenofibrate and Zocor for now given poor nutritional status, abdominal pain and elevated creatinine   11. FEN/GI  - NS @ 100cc/hr -Advance diet as tolerated. Zofran and Phenergan prn.   12. Dispo Pending clinical improvement.   Code FULL   LOS: 1 day   Laquinda Moller 05/28/2011, 9:12 AM

## 2011-05-29 DIAGNOSIS — N179 Acute kidney failure, unspecified: Secondary | ICD-10-CM | POA: Diagnosis present

## 2011-05-29 DIAGNOSIS — N12 Tubulo-interstitial nephritis, not specified as acute or chronic: Secondary | ICD-10-CM | POA: Diagnosis present

## 2011-05-29 DIAGNOSIS — K5900 Constipation, unspecified: Secondary | ICD-10-CM | POA: Diagnosis present

## 2011-05-29 LAB — BASIC METABOLIC PANEL
CO2: 21 mEq/L (ref 19–32)
Calcium: 8.4 mg/dL (ref 8.4–10.5)
Creatinine, Ser: 1.84 mg/dL — ABNORMAL HIGH (ref 0.50–1.10)
GFR calc non Af Amer: 27 mL/min — ABNORMAL LOW (ref >90)
Sodium: 138 mEq/L (ref 135–145)

## 2011-05-29 LAB — CBC
MCH: 29.2 pg (ref 26.0–34.0)
MCHC: 33.6 g/dL (ref 30.0–36.0)
MCV: 86.9 fL (ref 78.0–100.0)
Platelets: 504 10*3/uL — ABNORMAL HIGH (ref 150–400)

## 2011-05-29 LAB — GLUCOSE, CAPILLARY: Glucose-Capillary: 106 mg/dL — ABNORMAL HIGH (ref 70–99)

## 2011-05-29 MED ORDER — POLYETHYLENE GLYCOL 3350 17 G PO PACK
17.0000 g | PACK | Freq: Three times a day (TID) | ORAL | Status: DC
  Administered 2011-05-29: 17 g via ORAL
  Filled 2011-05-29 (×3): qty 1

## 2011-05-29 MED ORDER — PANTOPRAZOLE SODIUM 40 MG PO TBEC
40.0000 mg | DELAYED_RELEASE_TABLET | Freq: Two times a day (BID) | ORAL | Status: DC
  Administered 2011-05-29 – 2011-05-31 (×4): 40 mg via ORAL
  Filled 2011-05-29 (×5): qty 1

## 2011-05-29 MED ORDER — GLYCERIN (LAXATIVE) 2.1 G RE SUPP
1.0000 | Freq: Every day | RECTAL | Status: DC
  Administered 2011-05-29 – 2011-05-30 (×2): 1 via RECTAL
  Filled 2011-05-29 (×3): qty 1

## 2011-05-29 MED ORDER — TRAMADOL HCL 50 MG PO TABS
50.0000 mg | ORAL_TABLET | Freq: Four times a day (QID) | ORAL | Status: DC | PRN
  Administered 2011-05-29 – 2011-05-31 (×2): 50 mg via ORAL
  Filled 2011-05-29 (×2): qty 1

## 2011-05-29 MED ORDER — AMLODIPINE BESYLATE 10 MG PO TABS
10.0000 mg | ORAL_TABLET | Freq: Every day | ORAL | Status: DC
  Administered 2011-05-29: 10 mg via ORAL
  Filled 2011-05-29: qty 1

## 2011-05-29 MED ORDER — AMLODIPINE BESYLATE 5 MG PO TABS
5.0000 mg | ORAL_TABLET | Freq: Every day | ORAL | Status: DC
  Administered 2011-05-30 – 2011-05-31 (×2): 5 mg via ORAL
  Filled 2011-05-29 (×2): qty 1

## 2011-05-29 MED ORDER — POTASSIUM CHLORIDE IN NACL 20-0.9 MEQ/L-% IV SOLN
INTRAVENOUS | Status: DC
  Administered 2011-05-29 – 2011-05-31 (×4): via INTRAVENOUS
  Filled 2011-05-29 (×8): qty 1000

## 2011-05-29 NOTE — Progress Notes (Signed)
Patient ID: Kathryn Wall, female   DOB: [birth year], 65 y.o.   MRN: [id number]  Subjective: Patient states that nausea and abd pain has improved. Pt states that she feels that she may be to tolerate clears today. 1 small BM yesterday. Pt states that she has also had recurrent reflux sxs and epigastric pain despite negative upper and lower endoscopies. Pt states that she has been under a lot of stress and this seems to exacerbate epigastric pain and reflux.  Objective: Vital signs in last 24 hours: Temp:  [98.5 F (36.9 C)-99.1 F (37.3 C)] 99.1 F (37.3 C) (12/01 0500) Pulse Rate:  [67-118] 93  (12/01 0500) Resp:  [18-22] 22  (12/01 0500) BP: (134-164)/(78-102) 164/102 mmHg (12/01 0500) SpO2:  [92 %-99 %] 99 % (12/01 0500) Weight change:  Last BM Date: 05/29/11  Intake/Output from previous day: 11/30 0701 - 12/01 0700 In: 3939.8 [P.O.:840; I.V.:3043.8; IV Piggyback:56] Out: 2650 [Urine:2650] Intake/Output this shift:    General appearance: in bed, NAD Head: NCAT, EMOI Resp: Good effort, CTAB Cardio: RRR, No MRG GI: + abdominal distension, hypoactive bowel sounds, mild epigastric tenderness Extremities: 2+ peripheral pulses, no edema   Lab Results:  Basename 05/29/11 0745 05/28/11 0550  WBC 20.1* 15.6*  HGB 7.8* 7.5*  HCT 23.2* 21.8*  PLT 504* 478*   BMET  Basename 05/29/11 0745 05/28/11 0550  NA 138 136  K 3.3* 3.1*  CL 106 103  CO2 21 22  GLUCOSE 109* 120*  BUN 19 30*  CREATININE 1.84* 2.07*  CALCIUM 8.4 8.1*    Studies/Results: Ct Abdomen Pelvis Wo Contrast  05/27/2011  *RADIOLOGY REPORT*  Clinical Data: Abnormal labs, cramping, nausea, vomiting, shortness of breath, productive cough, dizziness, recent diagnosis of UTI; past history of hypertension, diabetes, hypercholesterolemia, breast cancer  CT ABDOMEN AND PELVIS WITHOUT CONTRAST  Technique:  Multidetector CT imaging of the abdomen and pelvis was performed following the standard protocol without  intravenous contrast. Sagittal and coronal MPR images reconstructed from axial data set.  Comparison: 01/07/2006  Findings: Minimal dependent atelectasis at lung bases. Low attenuation of circulating blood question anemia. Kidneys appear enlarged with thickened cortex versus prior study. Minimal perinephric edema particulate on the right. Within limits of a nonenhanced exam, no additional abnormalities of the liver, spleen, pancreas, kidneys, or adrenal glands. Two splenules adjacent to splenic hilum, stable. Normal caliber ureters without calcification.  Unremarkable bladder. Uterus surgically absent with normal sized ovaries. Scattered uncomplicated colonic diverticula. Appendix not visualized. Stomach and bowel loops otherwise grossly normal appearance. No mass, adenopathy, free fluid or hernia. Degenerative disc disease changes L3-L4 without acute osseous findings.  IMPRESSION: Bilateral renal cortical thickening and minimal perihilar edema raising question of urinary tract infection/pyelonephritis; recommend correlation with urinalysis. Question anemia. No additional significant intra abdominal or intrapelvic abnormalities.  Original Report Authenticated By: Lollie Marrow, M.D.   Dg Chest 2 View  05/27/2011  *RADIOLOGY REPORT*  Clinical Data: Nausea, vomiting, abdominal pain.  CHEST - 2 VIEW  Comparison: 06/06/2008  Findings: Heart is normal size.  Lungs are clear.  No effusions or acute bony abnormality.  Slight peribronchial thickening.  IMPRESSION: Slight bronchitic changes.  Original Report Authenticated By: Cyndie Chime, M.D.   Ct Head Wo Contrast  05/27/2011  *RADIOLOGY REPORT*  Clinical Data: Headache, dizziness and vomiting; history of breast cancer.  CT HEAD WITHOUT CONTRAST  Technique:  Contiguous axial images were obtained from the base of the skull through the vertex without contrast.  Comparison: None.  Findings: There is no evidence of acute infarction, mass lesion, or intra- or  extra-axial hemorrhage on CT.  Minimal periventricular white matter change likely reflects small vessel ischemic microangiopathy.  Mild cerebellar atrophy is noted.  The brainstem and fourth ventricle are within normal limits.  The third and lateral ventricles, and basal ganglia are unremarkable in appearance.  The cerebral hemispheres are symmetric in appearance, with normal gray-white differentiation.  No mass effect or midline shift is seen.  There is no evidence of fracture; visualized osseous structures are unremarkable in appearance.  The visualized portions of the orbits are within normal limits.  The paranasal sinuses and mastoid air cells are well-aerated.  No significant soft tissue abnormalities are seen.  IMPRESSION:  1.  No acute intracranial pathology seen on CT. 2.  Minimal small vessel ischemic microangiopathy.  Original Report Authenticated By: Tonia Ghent, M.D.   Dg Chest Port 1 View  05/28/2011  *RADIOLOGY REPORT*  Clinical Data: Headache.  Cough.  Shortness of breath  PORTABLE CHEST - 1 VIEW  Comparison: 1 day prior  Findings: Midline trachea.  Normal heart size.  Borderline right paratracheal soft tissue fullness is similar back to 2009 and therefore likely secondary to great vessels. No pleural effusion or pneumothorax.  Low lung volumes with resultant pulmonary interstitial prominence.  Diffuse peribronchial thickening.  IMPRESSION:  1. No acute cardiopulmonary disease. 2. Mild interstitial thickening.  Question smoking or chronic bronchitis.  Original Report Authenticated By: Consuello Bossier, M.D.    Medications:  I have reviewed the patient's current medications. Scheduled:    . buPROPion  150 mg Oral Daily  . cefTRIAXone (ROCEPHIN)  IV  1 g Intravenous Q24H  . cholecalciferol  1,000 Units Oral Daily  . heparin  5,000 Units Subcutaneous Q8H  . insulin aspart  0-9 Units Subcutaneous TID WC  . loratadine  10 mg Oral Daily  .  morphine injection  2 mg Intravenous Once  .  pantoprazole  40 mg Oral BID AC  . polyethylene glycol  17 g Oral TID  . sodium chloride  1,000 mL Intravenous Once  . zolpidem  5 mg Oral QHS  . DISCONTD: pantoprazole  40 mg Oral Daily   Continuous:    . 0.9 % NaCl with KCl 20 mEq / L    . DISCONTD: sodium chloride 125 mL/hr at 05/29/11 0645   ZOX:[id number], ALPRAZolam, ALPRAZolam, Glycerin (Adult), ondansetron (ZOFRAN) IV, ondansetron, promethazine, DISCONTD: ALPRAZolam  Assessment/Plan: Patient is a 65 yo F with PMH of HTN, DM, HLD, anxiety and breast cancer here with nausea, vomiting and abdominal pain.   1. Nausea/vomiting/abdominal pain:   Subjectively improving, though i think that this is likely multifactorial with contributions of gastritis, UTI, and constipation. Could not find results of urine cx. Will discuss this with nursing, and inpt team. Will continue with rocephin. Plan to place pt on bowel regimen with miralax tid scheduled. Protonix changed to gastritic dosing.  2. Headache:- Clinically resolved. Will continue to follow.  Head CT negative -Tylenol prn   3. Lightheadedness  -Clinically resolved. Likely secondary to intravascular depletion.  - Will get PT consult prior to discharge if still symptomatic  4.UTI Diagnosed at PCP, started on Cipro and received Rocephin x1 in ED. Will continue  -Awaiting gram stain and urine culture. Will discuss finding results with nursing, inpt team, and lab.  -WBC count is noted to be trending up. Will continue to follow. -If pt spikes temp, may consider renal ultrasound to evaluate for  harboring stone/renal abscess. Pt is overall clinically improving, so will follow.   5. Cough -Clinically resolved. I/S at bedside.  -Good air movt on lung exam.  6. HTN Elevated today as intravascular volume is improving.  -Will start on norvasc while in house as this should not affect renal function.   7.AKI  -Likely due to dehydration. Cr trending down with IVF.  - Continue  fluids -Added K to fluids in setting of persistent hypokalemia and poor po intake.  -Repeat Bmet in the AM  8. DM  - HgB A1C 6.5 -Will hold metformin for now.  -SSI   9. Psych  -continue home psych meds   10. HLD  -Will hold fenofibrate and Zocor for now given poor nutritional status, abdominal pain and elevated creatinine   11. FEN/GI  - NS+ K @ 125/hr.  -Still with poor po intake.  -Advance diet as tolerated. Zofran and Phenergan prn.   12. Dispo Pending clinical improvement.   Code FULL   LOS: 2 days   Joy Reiger 05/29/2011, 9:43 AM  ADDENDUM: Called and discussed micro results with [facility] lab. Urine cx grew out E coli that was resistant to ampicillin and bactrim. Micro Tech stated that ecoli was sensitive to rocephin. Micro Tech states that she would manually fax results to 5500 to be placed in chart.

## 2011-05-29 NOTE — Progress Notes (Signed)
FMTS Attending Daily Note: Denny Levy MD [phone] pager office [phone] I  have seen and examined this patient, reviewed their chart. I have discussed this patient with the resident. I agree with the resident's findings, assessment and care plan. Additionally, I disagree with miralax in setting of her nausea. Will do rectal suppository as I think residual stool not moving well through her colon and this may be contributing to her nausea. Add low dose tramadol for pain (diffuse abdominal and mild headache which is new). Continue to monitor closely.

## 2011-05-30 LAB — BASIC METABOLIC PANEL
BUN: 13 mg/dL (ref 6–23)
Calcium: 8.3 mg/dL — ABNORMAL LOW (ref 8.4–10.5)
Chloride: 104 mEq/L (ref 96–112)
Creatinine, Ser: 1.59 mg/dL — ABNORMAL HIGH (ref 0.50–1.10)
GFR calc Af Amer: 38 mL/min — ABNORMAL LOW (ref >90)

## 2011-05-30 LAB — GLUCOSE, CAPILLARY
Glucose-Capillary: 91 mg/dL (ref 70–99)
Glucose-Capillary: 110 mg/dL — ABNORMAL HIGH (ref 70–99)

## 2011-05-30 LAB — CBC
HCT: 23.1 % — ABNORMAL LOW (ref 36.0–46.0)
MCHC: 33.8 g/dL (ref 30.0–36.0)
MCV: 87.2 fL (ref 78.0–100.0)
RDW: 14.4 % (ref 11.5–15.5)

## 2011-05-30 LAB — DIFFERENTIAL
Basophils Absolute: 0.2 10*3/uL — ABNORMAL HIGH (ref 0.0–0.1)
Eosinophils Absolute: 0.4 10*3/uL (ref 0.0–0.7)
Lymphocytes Relative: 7 % — ABNORMAL LOW (ref 12–46)
Lymphs Abs: 1.2 10*3/uL (ref 0.7–4.0)
Neutro Abs: 14.7 10*3/uL — ABNORMAL HIGH (ref 1.7–7.7)

## 2011-05-30 LAB — OCCULT BLOOD X 1 CARD TO LAB, STOOL: Fecal Occult Bld: NEGATIVE

## 2011-05-30 MED ORDER — PNEUMOCOCCAL VAC POLYVALENT 25 MCG/0.5ML IJ INJ
0.5000 mL | INJECTION | INTRAMUSCULAR | Status: AC
  Administered 2011-05-31: 0.5 mL via INTRAMUSCULAR
  Filled 2011-05-30: qty 0.5

## 2011-05-30 MED ORDER — INFLUENZA VIRUS VACC SPLIT PF IM SUSP
0.5000 mL | INTRAMUSCULAR | Status: AC
  Administered 2011-05-31: 0.5 mL via INTRAMUSCULAR
  Filled 2011-05-30: qty 0.5

## 2011-05-30 NOTE — Progress Notes (Signed)
FMTS Attending Daily Note: Kathryn Levy MD [phone] pager office [phone] I  have seen and examined this patient, reviewed their chart. I have discussed this patient with the resident. I agree with the resident's findings, assessment and care plan. . Her kidney function and WBC count continue to improve. Her headache is resolved. The tramadol helped her abdominal pain and her headache and she requests some to take home which we will provide. She is eating clears with no nausea or emesis.Likely d/c home tomorrow. I have discussed this with the patient and her husband today and they are in agreement with the plan

## 2011-05-30 NOTE — Progress Notes (Signed)
Patient ID: Kathryn Wall, female   DOB: Oct 13, 1945, 65 y.o.   MRN: [id number]  Subjective: Patient states that nausea and abd pain has improved and she ate her clears breakfast this AM.  She feels she would be able to advance diet.   Wants to go home if possible.  Walked around room this morning, feels dizziness has much improved and she was about to move around without feeling bad.   Objective: Vital signs in last 24 hours: Temp:  [98 F (36.7 C)-98.7 F (37.1 C)] 98.5 F (36.9 C) (12/02 0504) Pulse Rate:  [69-104] 104  (12/02 0504) Resp:  [17-19] 18  (12/02 0504) BP: (132-149)/(79-90) 149/90 mmHg (12/02 0504) SpO2:  [91 %-97 %] 94 % (12/02 0504) Weight change:  Last BM Date: 05/29/11  Intake/Output from previous day: 12/01 0701 - 12/02 0700 In: 2386.3 [P.O.:480; I.V.:1906.3] Out: 250 [Urine:250] Intake/Output this shift:    General appearance:  NAD watched her walk without dizziness Head: NCAT, EOMI Resp: Good effort, CTAB Cardio: RRR, No MRG GI: + abdominal distension, hypoactive bowel sounds, mild epigastric tenderness Extremities: 2+ peripheral pulses, no edema   Lab Results:  [facility] 05/30/11 0725 05/29/11 0745  WBC 17.6* 20.1*  HGB 7.8* 7.8*  HCT 23.1* 23.2*  PLT 547* 504*   BMET  Basename 05/30/11 0725 05/29/11 0745  NA 136 138  K 3.4* 3.3*  CL 104 106  CO2 22 21  GLUCOSE 98 109*  BUN 13 19  CREATININE 1.59* 1.84*  CALCIUM 8.3* 8.4   U Cx:E coli that was resistant to ampicillin and bactrim.  Studies/Results: No results found.  Medications:  I have reviewed the patient's current medications. Scheduled:    . amLODipine  5 mg Oral Daily  . buPROPion  150 mg Oral Daily  . cefTRIAXone (ROCEPHIN)  IV  1 g Intravenous Q24H  . cholecalciferol  1,000 Units Oral Daily  . Glycerin (Adult)  1 suppository Rectal Daily  . heparin  5,000 Units Subcutaneous Q8H  . insulin aspart  0-9 Units Subcutaneous TID WC  . loratadine  10 mg Oral Daily  .  pantoprazole  40 mg Oral BID AC  . sodium chloride  1,000 mL Intravenous Once  . zolpidem  5 mg Oral QHS  . DISCONTD: amLODipine  10 mg Oral Daily  . DISCONTD: pantoprazole  40 mg Oral Daily  . DISCONTD: polyethylene glycol  17 g Oral TID   Continuous:    . 0.9 % NaCl with KCl 20 mEq / L 125 mL/hr at 05/30/11 0314  . DISCONTD: sodium chloride 125 mL/hr at 05/29/11 0645   WUJ:[id number], ALPRAZolam, ALPRAZolam, ondansetron (ZOFRAN) IV, ondansetron, promethazine, traMADol, DISCONTD: Glycerin (Adult)  Assessment/Plan: Patient is a 66 yo F with PMH of HTN, DM, HLD, anxiety and breast cancer here with nausea, vomiting and abdominal pain.   1. Nausea/vomiting/abdominal pain:   Improved, now taking PO.  Will switch all meds to PO today. Continue Protonix.  2. Headache:- Clinically resolved. Will continue to follow.  Head CT negative -Tylenol prn   3. Lightheadedness  -Clinically resolved. Likely secondary to intravascular depletion.  - Will not get PT consult since able to ambulate well  4.UTI Diagnosed at PCP, started on Cipro and received Rocephin x1 in ED. Will continue  -will place on PO abx fo rtotal 14 days since systemically ill, presumed pyelonephritis. -WBC count is trending down and afebrile.   5. Cough -Clinically resolved. I/S at bedside.  -Good air movt on lung exam.  6. HTN Elevated today as intravascular volume is improving.  -started on norvasc 12/1.   7.AKI  -Likely due to dehydration. Cr trending down with IVF.  - Continue fluids until consistently taking PO   8. DM  - HgB A1C 6.5 -Will hold metformin for now.  -SSI   9. Psych  -continue home psych meds   10. HLD  -Will hold fenofibrate and Zocor for now given poor nutritional status, abdominal pain and elevated creatinine   11. FEN/GI  - NS+ K @ 125/hr.  -Still with poor po intake.  -Advance diet as tolerated. Zofran and Phenergan prn.   12. Dispo Pending clinical improvement.   Code  FULL   LOS: 3 days   Biruk Troia 05/30/2011, 9:12 AM

## 2011-05-31 MED ORDER — GUAIFENESIN ER 600 MG PO TB12
600.0000 mg | ORAL_TABLET | Freq: Two times a day (BID) | ORAL | Status: DC | PRN
  Administered 2011-05-31: 600 mg via ORAL
  Filled 2011-05-31: qty 1

## 2011-05-31 MED ORDER — CEPHALEXIN 500 MG PO CAPS: 500.0000 mg | ORAL_CAPSULE | Freq: Three times a day (TID) | ORAL | Status: AC

## 2011-05-31 MED ORDER — TRAMADOL HCL 50 MG PO TABS: 50.0000 mg | ORAL_TABLET | Freq: Four times a day (QID) | ORAL | Status: AC | PRN

## 2011-05-31 NOTE — Progress Notes (Signed)
Daily Progress Note Kathryn Wall. Kathryn Wall, M.D., M.B.A  Family Medicine PGY-1 Pager [phone]  Subjective: Patient notes cough and post-tussive nausea, otherwise no nausea or vomiting, cough is worse when lying down and has been present for several months; She denies current abdominal pain, fever, or chills and believes that she is appropriate for d/c today   Objective: Vital signs in last 24 hours: Temp:  [98 F (36.7 C)-98.7 F (37.1 C)] 98.7 F (37.1 C) (12/03 0509) Pulse Rate:  [84-94] 84  (12/03 0509) Resp:  [17-19] 17  (12/03 0509) BP: (115-138)/(69-82) 115/69 mmHg (12/03 0509) SpO2:  [90 %-96 %] 90 % (12/03 0509) Weight change:  Last BM Date: 05/30/11  Intake/Output from previous day: 12/02 0701 - 12/03 0700 In: 2140 [P.O.:840; I.V.:1250; IV Piggyback:50] Out: -  Intake/Output this shift:   General appearance: NAD, lying supine in bed   Head: NCAT, EOMI  Resp: Good effort, CTAB  Cardio: RRR, No MRG  GI: + abdominal distension, hypoactive bowel sounds, mild epigastric tenderness  Extremities: 2+ peripheral pulses, no edema   Lab Results:  Basename 05/30/11 0725 05/29/11 0745  WBC 17.6* 20.1*  HGB 7.8* 7.8*  HCT 23.1* 23.2*  PLT 547* 504*   BMET  Basename 05/30/11 0725 05/29/11 0745  NA 136 138  K 3.4* 3.3*  CL 104 106  CO2 22 21  GLUCOSE 98 109*  BUN 13 19  CREATININE 1.59* 1.84*  CALCIUM 8.3* 8.4    Studies/Results: No results found.  Medications:  I have reviewed the patient's current medications. Scheduled:   . amLODipine  5 mg Oral Daily  . buPROPion  150 mg Oral Daily  . cefTRIAXone (ROCEPHIN)  IV  1 g Intravenous Q24H  . cholecalciferol  1,000 Units Oral Daily  . Glycerin (Adult)  1 suppository Rectal Daily  . heparin  5,000 Units Subcutaneous Q8H  . influenza  inactive virus vaccine  0.5 mL Intramuscular Tomorrow-1000  . insulin aspart  0-9 Units Subcutaneous TID WC  . loratadine  10 mg Oral Daily  . pantoprazole  40 mg Oral BID AC    . pneumococcal 23 valent vaccine  0.5 mL Intramuscular Tomorrow-1000  . sodium chloride  1,000 mL Intravenous Once  . zolpidem  5 mg Oral QHS   Continuous:   . 0.9 % NaCl with KCl 20 mEq / L 125 mL/hr at 05/31/11 1308   MVH:[id number], ALPRAZolam, ALPRAZolam, guaiFENesin, ondansetron (ZOFRAN) IV, ondansetron, promethazine, traMADol  Assessment/Plan: Patient is a 65 yo F with PMH of HTN, DM, HLD, anxiety and breast cancer here with nausea, vomiting and abdominal pain.  1. Nausea/vomiting/abdominal pain:  Improved, now taking PO.Meds PO. Continue Protonix.  2. Headache:- Clinically resolved. Will continue to follow.  Head CT negative  -Tylenol prn  3. Lightheadedness  -Clinically resolved. Likely secondary to intravascular depletion.  - Will not get PT consult since able to ambulate well  4.UTI Diagnosed at PCP, started on Cipro and received Rocephin x1 in ED. Will continue  -will place on PO abx fo rtotal 14 days since systemically ill, presumed pyelonephritis.  -WBC count is trending down and afebrile.  5. Cough  -Clinically resolved. I/S at bedside.  -Good air movt on lung exam.  6. HTN  Elevated today as intravascular volume is improving.  -started on norvasc 12/1.  7.AKI  -Likely due to dehydration. Cr trending down with IVF.  - Continue fluids until consistently taking PO  8. DM  - HgB A1C 6.5  -Will hold metformin  for now.  -SSI  9. Psych  -continue home psych meds  10. HLD  -Will hold fenofibrate and Zocor for now given poor nutritional status, abdominal pain and elevated creatinine  11. FEN/GI  - NS+ K @ 125/hr.  -Still with poor po intake.  -Advance diet as tolerated. Zofran and Phenergan prn.  12. Dispo D/C today  Code FULL     LOS: 4 days   [name] 05/31/2011, 8:33 AM

## 2011-05-31 NOTE — Discharge Summary (Signed)
Physician Discharge Summary  Patient ID: Kathryn Wall MRN: [id number] DOB/AGE: Sep 11, 1945 65 y.o. YQM:VHQIO Kathryn Arthurs, DNP @ [facility]   Admit date: 05/27/2011 Discharge date: 05/31/2011  Admission Diagnoses: Abdominal Pain   Discharge Diagnoses:  Principal Problem:  *Pyelonephritis Active Problems:  Constipation  AKI (acute kidney injury)   Discharged Condition: good  Hospital Course: Patient is a 65 yo F with PMH of HTN, DM, HLD, anxiety and breast cancer who presented to the ED for a 10 day history of nausea, vomiting and abdominal pain. In the ED after arrival, she was found to be orthostatic. Her HR was in the low 100's. WBC of 23, UA showed + bacteria and therefore she was given one dose of Rocephin. Due to dehydration and 10 days of pain, Family medicine was called for admission. At the time of admission the patient's CT scan of her abdomen showed only renal cortical thickening and no other acute processes.   1. Nausea and vomiting: upon admission the patient had significant abdominal pain as well as nausea and vomiting. She was held without food and given Zofran and Phenergan. There are multiple possible etiologies of this nausea and vomiting. These include viral gastroenteritis, anxiety, and complication from pyelonephritis. By hospital day 2 the patient's bowel pain and nausea had improved and she was transitioned to clears. On hospital day 4 all medications were switched to oral and her diet was advanced. On hospital day 5 she was tolerating regular food and only complained of mild abdominal pain for which she has had a significant outpatient workup and believes is related to stress.  2. Headache: The patient presented with a headache. Given the combination of the headache and the nausea there was concern for intracranial process. Therefore CT of the head was performed and showed no acute process. The headache resolved with Tylenol.  3. Pyelonephritis: The patient  arrived with a diagnosis of urinary tract infection which had failed outpatient therapy with ciprofloxacin. Therefore she was transitioned to ceftriaxone to cover for pyelonephritis. She was given IV ceftriaxone for 4 days and was discharged on Keflex. By the time of discharge her white blood cell count was trending down  4. Anxiety: the patient has depression and anxiety for which she is treated with Xanax and bupropion. These are maintained while she was in the hospital. Kathryn Wall states that her Xanax relieves her abdominal pain.  5. Acute kidney injury: upon admission the patient's creatinine was 2.38. This was determined to be likely prerenal on account of fluid losses. Therefore she was given IV fluid and her creatinine trended down. The day before discharge her creatinine was 1.59. This should be followed up in the outpatient setting.  Consults: none  Significant Diagnostic Studies:   CT ABDOMEN AND PELVIS WITHOUT CONTRAST 05/27/11 IMPRESSION:  Bilateral renal cortical thickening and minimal perihilar edema  raising question of urinary tract infection/pyelonephritis;  recommend correlation with urinalysis.  Question anemia.  No additional significant intra abdominal or intrapelvic  abnormalities.  CT HEAD WITHOUT CONTRAST IMPRESSION:  1. No acute intracranial pathology seen on CT.  2. Minimal small vessel ischemic microangiopathy.    Discharge Exam: Blood pressure 122/76, pulse 84, temperature 98.7 F (37.1 C), temperature source Oral, resp. rate 17, height 5\' 4"  (1.626 m), weight 146 lb 1.6 oz (66.271 kg), SpO2 90.00%. General appearance: NAD, lying supine in bed  Head: NCAT, EOMI  Resp: Good effort, CTAB  Cardio: RRR, No MRG  GI: + abdominal distension, hypoactive bowel sounds,  mild epigastric tenderness  Extremities: 2+ peripheral pulses, no edema   Disposition: Final discharge disposition not confirmed  Discharge Orders    Future Appointments: Provider:  Department: Dept Phone: Center:   06/14/2011 2:00 PM Harrington Challenger, NP [facility] [phone] [facility]     Current Discharge Medication List    CONTINUE these medications which have NOT CHANGED   Details  ALPRAZolam (XANAX) 0.5 MG tablet Take 0.5 mg by mouth at bedtime as needed.      Azilsartan-Chlorthalidone (EDARBYCLOR) 40-12.5 MG TABS Take 1 tablet by mouth every morning.      buPROPion (WELLBUTRIN XL) 150 MG 24 hr tablet Take 150 mg by mouth daily.      cetirizine (ZYRTEC) 10 MG tablet Take 10 mg by mouth daily.      Cholecalciferol (VITAMIN D3) 1000 UNITS CAPS Take 1 capsule by mouth daily.      fenofibrate 160 MG tablet Take 160 mg by mouth at bedtime.      lisinopril (PRINIVIL,ZESTRIL) 40 MG tablet Take 40 mg by mouth daily.      metFORMIN (GLUCOPHAGE) 500 MG tablet Take 500 mg by mouth 2 (two) times daily with a meal.      Multiple Vitamins-Minerals (MULTIVITAMINS THER. W/MINERALS) TABS Take 1 tablet by mouth daily.      niacin (NIASPAN) 500 MG CR tablet Take 500 mg by mouth at bedtime.      pantoprazole (PROTONIX) 40 MG tablet Take 40 mg by mouth daily.      simvastatin (ZOCOR) 40 MG tablet Take 40 mg by mouth at bedtime.      zolpidem (AMBIEN) 10 MG tablet Take 10 mg by mouth at bedtime.        STOP taking these medications     ciprofloxacin (CIPRO) 500 MG tablet          Signed: [name] 05/31/2011, 12:48 PM

## 2011-05-31 NOTE — Progress Notes (Signed)
Family Medicine Teaching Service Attending Note  I interviewed and examined patient  [place] and reviewed their tests and x-rays.  I discussed with Dr.  [name] and reviewed their note for today.  I agree with their assessment and plan.    Stable to discharge.  Continue full course of oral antibiotics and follow up with PCP for chronic medical problems

## 2011-05-31 NOTE — Progress Notes (Signed)
Nsg Discharge Note  Admit Date:  05/27/2011 Discharge date: 05/31/2011   [name] to be D/C'd Home per MD order.   All belongings sent with the patient and all questions fully answered.  Discharge Medication:  Exie, Chrismer  Home Medication Instructions [id number]   Printed on:05/31/11 1411  Medication Information                    buPROPion (WELLBUTRIN XL) 150 MG 24 hr tablet Take 150 mg by mouth daily.             metFORMIN (GLUCOPHAGE) 500 MG tablet Take 500 mg by mouth 2 (two) times daily with a meal.             pantoprazole (PROTONIX) 40 MG tablet Take 40 mg by mouth daily.             simvastatin (ZOCOR) 40 MG tablet Take 40 mg by mouth at bedtime.             ALPRAZolam (XANAX) 0.5 MG tablet Take 0.5 mg by mouth at bedtime as needed.             zolpidem (AMBIEN) 10 MG tablet Take 10 mg by mouth at bedtime.             niacin (NIASPAN) 500 MG CR tablet Take 500 mg by mouth at bedtime.             fenofibrate 160 MG tablet Take 160 mg by mouth at bedtime.             lisinopril (PRINIVIL,ZESTRIL) 40 MG tablet Take 40 mg by mouth daily.             Azilsartan-Chlorthalidone (EDARBYCLOR) 40-12.5 MG TABS Take 1 tablet by mouth every morning.             Multiple Vitamins-Minerals (MULTIVITAMINS THER. W/MINERALS) TABS Take 1 tablet by mouth daily.             Cholecalciferol (VITAMIN D3) 1000 UNITS CAPS Take 1 capsule by mouth daily.             cetirizine (ZYRTEC) 10 MG tablet Take 10 mg by mouth daily.             traMADol (ULTRAM) 50 MG tablet Take 1 tablet (50 mg total) by mouth every 6 (six) hours as needed. Maximum dose= 8 tablets per day           cephALEXin (KEFLEX) 500 MG capsule Take 1 capsule (500 mg total) by mouth 3 (three) times daily.             Discharge Assessment: Filed Vitals:   05/31/11 1014  BP: 122/76  Pulse:   Temp:   Resp:    Skin clean, dry and intact without evidence of skin break down, no evidence of skin  tears noted. IV catheter discontinued intact. Site without signs and symptoms of complications. Dressing and pressure applied.  D/c Instructions-Education: Discharge instructions given to patient/family with verbalized understanding. D/c education completed with patient/family including follow up instructions, medication list, d/c activities limitations if indicated, with other d/c instructions as indicated by MD - patient able to verbalize understanding, all questions fully answered. Patient instructed to return to ED, call 911, or call MD for any changes in condition.  Patient escorted via WC by tech, and D/C home via private auto with husband.  Gwendalyn Ege, RN 05/31/2011 2:11 PM

## 2011-06-02 NOTE — Discharge Summary (Signed)
Family Medicine Teaching Service Attending Note  I discussed patient Kathryn Wall  with Dr. [name] and agree with his discharge summary

## 2011-06-14 ENCOUNTER — Encounter: Payer: Self-pay | Admitting: Women's Health

## 2011-08-12 ENCOUNTER — Other Ambulatory Visit (HOSPITAL_COMMUNITY)
Admission: RE | Admit: 2011-08-12 | Discharge: 2011-08-12 | Disposition: A | Discharge status: Home / Self Care | Payer: Medicare Other | Source: Ambulatory Visit | Attending: Obstetrics and Gynecology | Admitting: Obstetrics and Gynecology

## 2011-08-12 ENCOUNTER — Ambulatory Visit (INDEPENDENT_AMBULATORY_CARE_PROVIDER_SITE_OTHER): Payer: Medicare Other | Admitting: Women's Health

## 2011-08-12 ENCOUNTER — Encounter: Payer: Self-pay | Admitting: Women's Health

## 2011-08-12 VITALS — BP 120/82 | Ht 65.0 in | Wt 144.0 lb

## 2011-08-12 DIAGNOSIS — Z124 Encounter for screening for malignant neoplasm of cervix: Secondary | ICD-10-CM

## 2011-08-12 DIAGNOSIS — Z01419 Encounter for gynecological examination (general) (routine) without abnormal findings: Secondary | ICD-10-CM | POA: Insufficient documentation

## 2011-08-12 DIAGNOSIS — N951 Menopausal and female climacteric states: Secondary | ICD-10-CM

## 2011-08-12 NOTE — Progress Notes (Signed)
Kathryn Wall Mar 11, 1946 [id number]    History:    The patient presents for pap. No complaints, history of breast cancer in 1997 at age 66, BRCA1 negative, BRCA 2  favor benign. History of diabetes, hypercholesterolemia and type 2 diabetes managed by primary care with labs and meds. TAH for menorrhagia. History of normal Paps. Normal colonoscopy in 2010. Had a normal bone density in 2011 at her primary care.  Past medical history, past surgical history, family history and social history were all reviewed and documented in the EPIC chart. Has had both zostovac and Pneumovax.   ROS:  A  ROS was performed and pertinent positives and negatives are included in the history.  Exam:  Filed Vitals:   08/12/11 1438  BP: 120/82    General appearance:  Normal Head/Neck:  Normal, without cervical or supraclavicular adenopathy. Thyroid:  Symmetrical, normal in size, without palpable masses or nodularity. Respiratory  Effort:  Normal  Auscultation:  Clear without wheezing or rhonchi Cardiovascular  Auscultation:  Regular rate, without rubs, murmurs or gallops  Edema/varicosities:  Not grossly evident Abdominal  Soft,nontender, without masses, guarding or rebound.  Liver/spleen:  No organomegaly noted  Hernia:  None appreciated  Skin  Inspection:  Grossly normal  Palpation:  Grossly normal Neurologic/psychiatric  Orientation:  Normal with appropriate conversation.  Mood/affect:  Normal  Genitourinary    Breasts: Examined lying and sitting.     Right: Surgically altered with lumpectomy     Left: Without masses, retractions, discharge or axillary adenopathy.   Inguinal/mons:  Normal without inguinal adenopathy  External genitalia:  Normal  BUS/Urethra/Skene's glands:  Normal  Bladder:  Normal  Vagina:  Normal  Cervix:  absent  Uterus:    Adnexa/parametria:     Rt: Without masses or tenderness.   Lt: Without masses or tenderness.  Anus and perineum: Normal  Digital rectal  exam: Normal sphincter tone without palpated masses or tenderness  Assessment/Plan:  66 y.o. MWF G3 P2 for pap and exam without complaint.  TAH in 27 for menorrhagia/no HRT Right breast cancer age 21 lumpectomy/radiation - normal mammograms after Vaginal atrophy  Plan: Pap, Hemoccult cards given. Continue care with primary care for labs and medications. SBE's, annual mammogram, has scheduled tomorrow. Reviewed importance of exercise in relationship to bone and heart health. Calcium rich diet, vitamin D 1000 daily encouraged. Vaginal lubricants with intercourse encouraged.    Harrington Challenger [facility], 3:18 PM 08/12/2011

## 2011-08-25 ENCOUNTER — Encounter: Payer: Self-pay | Admitting: Women's Health

## 2011-10-19 ENCOUNTER — Other Ambulatory Visit: Payer: Self-pay | Admitting: Family Medicine

## 2011-10-19 DIAGNOSIS — N289 Disorder of kidney and ureter, unspecified: Secondary | ICD-10-CM

## 2011-10-22 ENCOUNTER — Ambulatory Visit
Admission: RE | Admit: 2011-10-22 | Discharge: 2011-10-22 | Disposition: A | Discharge status: Home / Self Care | Payer: Medicare Other | Source: Ambulatory Visit | Attending: Family Medicine | Admitting: Family Medicine

## 2011-10-22 DIAGNOSIS — N289 Disorder of kidney and ureter, unspecified: Secondary | ICD-10-CM

## 2012-01-14 ENCOUNTER — Encounter: Payer: Self-pay | Admitting: Cardiology

## 2012-02-06 ENCOUNTER — Emergency Department (HOSPITAL_COMMUNITY)
Admission: EM | Admit: 2012-02-06 | Discharge: 2012-02-06 | Disposition: A | Discharge status: Home / Self Care | Payer: Medicare Other | Source: Home / Self Care

## 2012-02-06 ENCOUNTER — Emergency Department (INDEPENDENT_AMBULATORY_CARE_PROVIDER_SITE_OTHER): Payer: Medicare Other

## 2012-02-06 ENCOUNTER — Encounter (HOSPITAL_COMMUNITY): Payer: Self-pay | Admitting: Emergency Medicine

## 2012-02-06 DIAGNOSIS — M25511 Pain in right shoulder: Secondary | ICD-10-CM

## 2012-02-06 DIAGNOSIS — M25519 Pain in unspecified shoulder: Secondary | ICD-10-CM

## 2012-02-06 MED ORDER — TRAMADOL HCL 50 MG PO TABS: 50.0000 mg | ORAL_TABLET | Freq: Four times a day (QID) | ORAL | Status: AC | PRN

## 2012-02-06 NOTE — ED Notes (Signed)
Reports she was trying to hang a curtain, fell backwards landing on right elbow, shoulder and hit her head on the carpeted floor.  No loc.  Continued pain in right shoulder .

## 2012-02-06 NOTE — ED Notes (Signed)
Assisted getting undressed and into gown for physician examination

## 2012-02-06 NOTE — ED Provider Notes (Signed)
History     CSN: [id number]  Arrival date & time 02/06/12  1438   None     Chief Complaint  Patient presents with  . Shoulder Pain    (Consider location/radiation/quality/duration/timing/severity/associated sxs/prior treatment) The history is provided by the patient and the spouse.  SUBJECTIVE: Kathryn Wall is a 66 y.o. female who sustained a right shoulder injury one day ago. Mechanism of injury: fall from chair while trying to adjust curtains.  Immediate symptoms: pain. Symptoms have been gradually worse since that time. No prior history of related problems.  Past Medical History  Diagnosis Date  . Hypertension   . High cholesterol   . Diabetes mellitus     TYPE II  . Breast cancer 1997    AGE 27, BRCA 1 NEGATIVE 2. 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     Past Surgical History  Procedure Date  . Cholecystectomy 1980  . Breast surgery 1997    RIGHT BREAST LUMPECTOMY  . Abdominal hysterectomy 1985    TAH    Family History  Problem Relation Age of Onset  . Cancer Mother     COLON  . Hypertension Father   . Heart disease Father     History  Substance Use Topics  . Smoking status: Never Smoker   . Smokeless tobacco: Never Used  . Alcohol Use: No    OB History    Grav Para Term Preterm Abortions TAB SAB Ect Mult Living   3 2   1  1          Review of Systems  Constitutional: Negative.   Respiratory: Negative.   Cardiovascular: Negative.   Musculoskeletal: Positive for arthralgias. Negative for myalgias, back pain, joint swelling and gait problem.  Neurological: Positive for dizziness and headaches. Negative for syncope and light-headedness.    Allergies  Allergen; Cymbalta; and Other  Home Medications   Current Outpatient Rx  Name Route Sig Dispense Refill  . ALPRAZOLAM 0.5 MG PO TABS Oral Take 0.5 mg by mouth at bedtime as needed.      . AZILSARTAN-CHLORTHALIDONE 40-12.5 MG PO TABS Oral Take 1 tablet by mouth every morning.       Marland Kitchen BUPROPION HCL ER (XL) 150 MG PO TB24 Oral Take 150 mg by mouth daily.      Marland Kitchen CETIRIZINE HCL 10 MG PO TABS Oral Take 10 mg by mouth daily.      Marland Kitchen VITAMIN D3 1000 UNITS PO CAPS Oral Take 1 capsule by mouth daily.      . FENOFIBRATE 160 MG PO TABS Oral Take 160 mg by mouth at bedtime.      Marland Kitchen LISINOPRIL 40 MG PO TABS Oral Take 40 mg by mouth daily.      Marland Kitchen METFORMIN HCL 500 MG PO TABS Oral Take 500 mg by mouth 2 (two) times daily with a meal.      . THERA M PLUS PO TABS Oral Take 1 tablet by mouth daily.      Marland Kitchen NIACIN ER (ANTIHYPERLIPIDEMIC) 500 MG PO TBCR Oral Take 500 mg by mouth at bedtime.      Marland Kitchen PANTOPRAZOLE SODIUM 40 MG PO TBEC Oral Take 40 mg by mouth daily.      Marland Kitchen SIMVASTATIN 40 MG PO TABS Oral Take 40 mg by mouth at bedtime.      . TRAMADOL HCL 50 MG PO TABS Oral Take 1 tablet (50 mg total) by mouth every 6 (six) hours as needed for pain.  15 tablet 0  . ZOLPIDEM TARTRATE 10 MG PO TABS Oral Take 10 mg by mouth at bedtime.        BP 120/78  Pulse 89  Temp 98.2 F (36.8 C) (Oral)  Resp 18  SpO2 96%  Physical Exam  Nursing note and vitals reviewed. Constitutional: She is oriented to person, place, and time. Vital signs are normal. She appears well-developed and well-nourished. She is active and cooperative.  HENT:  Head: Normocephalic and atraumatic.  Right Ear: Hearing, tympanic membrane, external ear and ear canal normal.  Left Ear: Hearing, tympanic membrane, external ear and ear canal normal.  Nose: Nose normal.  Mouth/Throat: Uvula is midline, oropharynx is clear and moist and mucous membranes are normal.  Eyes: Conjunctivae and EOM are normal. Pupils are equal, round, and reactive to light. No scleral icterus.  Neck: Trachea normal, normal range of motion and full passive range of motion without pain. Neck supple. No spinous process tenderness and no muscular tenderness present. No rigidity. No edema, no erythema and normal range of motion present.  Cardiovascular: Normal  rate, regular rhythm, normal heart sounds and normal pulses.   Pulmonary/Chest: Effort normal and breath sounds normal. She exhibits no tenderness.  Musculoskeletal:       Right shoulder: Normal.       Left shoulder: Normal.       Right wrist: Normal.       Arms:      Right hand: Normal.       Tenderness superior to right collarbone.  Tenderness at below right rib cage, no bruising or redness.  Neurological: She is alert and oriented to person, place, and time. She has normal strength. No cranial nerve deficit or sensory deficit. Coordination and gait normal. GCS eye subscore is 4. GCS verbal subscore is 5. GCS motor subscore is 6.       Bilateral equal hand grips, sensation intact  Skin: Skin is warm and dry. No bruising and no ecchymosis noted. No erythema.  Psychiatric: She has a normal mood and affect. Her speech is normal and behavior is normal. Judgment and thought content normal. Cognition and memory are normal.    ED Course  Procedures (including critical care time)  Labs Reviewed - No data to display Dg Shoulder Right  02/06/2012  *RADIOLOGY REPORT*  Clinical Data: Fall, shoulder pain  RIGHT SHOULDER - 2+ VIEW  Comparison: Chest radiograph dated 05/28/2011  Findings: No fracture or dislocation is seen.  Right acromioclavicular joint measures 7 mm, at the upper limits of normal, and is likely unchanged from prior chest radiograph.  The visualized soft tissues are unremarkable.  Visualized right lung is clear.  IMPRESSION: No fracture or dislocation is seen.  Original Report Authenticated By: Charline Bills, M.D.     1. Right shoulder pain       MDM  Continue tylenol or ultram as needed for pain.   Use warm compresses, follow up with primary care provider for symptoms that are not resolved.  Dizziness and headache post fall, no concern for neuro or cardiac etiology.     Johnsie Kindred, NP 02/08/12 1057  Johnsie Kindred, NP 02/11/12 2233

## 2012-02-22 NOTE — ED Provider Notes (Signed)
Medical screening examination/treatment/procedure(s) were performed by non-physician practitioner and as supervising physician I was immediately available for consultation/collaboration.  Luiz Blare MD   Luiz Blare, MD 02/22/12 [phone]

## 2012-07-17 ENCOUNTER — Other Ambulatory Visit: Payer: Self-pay | Admitting: Family Medicine

## 2012-07-17 DIAGNOSIS — R270 Ataxia, unspecified: Secondary | ICD-10-CM

## 2012-07-18 ENCOUNTER — Ambulatory Visit
Admission: RE | Admit: 2012-07-18 | Discharge: 2012-07-18 | Disposition: A | Discharge status: Home / Self Care | Payer: Medicare Other | Source: Ambulatory Visit | Attending: Family Medicine | Admitting: Family Medicine

## 2012-07-18 DIAGNOSIS — R270 Ataxia, unspecified: Secondary | ICD-10-CM

## 2012-07-21 ENCOUNTER — Other Ambulatory Visit (HOSPITAL_COMMUNITY): Payer: Self-pay | Admitting: Family Medicine

## 2012-07-21 DIAGNOSIS — R55 Syncope and collapse: Secondary | ICD-10-CM

## 2012-07-25 ENCOUNTER — Ambulatory Visit (HOSPITAL_COMMUNITY)
Admission: RE | Admit: 2012-07-25 | Discharge: 2012-07-25 | Disposition: A | Discharge status: Home / Self Care | Payer: Medicare Other | Source: Ambulatory Visit | Attending: Cardiovascular Disease | Admitting: Cardiovascular Disease

## 2012-07-25 DIAGNOSIS — I10 Essential (primary) hypertension: Secondary | ICD-10-CM | POA: Insufficient documentation

## 2012-07-25 DIAGNOSIS — R55 Syncope and collapse: Secondary | ICD-10-CM | POA: Insufficient documentation

## 2012-07-25 NOTE — Progress Notes (Signed)
2D Echo Performed 07/25/2012    Clearence Ped, RCS

## 2012-08-14 ENCOUNTER — Encounter: Payer: Self-pay | Admitting: Women's Health

## 2012-08-14 ENCOUNTER — Ambulatory Visit (INDEPENDENT_AMBULATORY_CARE_PROVIDER_SITE_OTHER): Payer: Medicare Other | Admitting: Women's Health

## 2012-08-14 ENCOUNTER — Encounter: Payer: Medicare Other | Admitting: Gynecology

## 2012-08-14 VITALS — BP 138/84 | Ht 65.0 in | Wt 154.0 lb

## 2012-08-14 DIAGNOSIS — I10 Essential (primary) hypertension: Secondary | ICD-10-CM | POA: Insufficient documentation

## 2012-08-14 DIAGNOSIS — E119 Type 2 diabetes mellitus without complications: Secondary | ICD-10-CM

## 2012-08-14 DIAGNOSIS — E7800 Pure hypercholesterolemia, unspecified: Secondary | ICD-10-CM

## 2012-08-14 DIAGNOSIS — N952 Postmenopausal atrophic vaginitis: Secondary | ICD-10-CM

## 2012-08-14 NOTE — Patient Instructions (Addendum)

## 2012-08-14 NOTE — Progress Notes (Signed)
Kathryn Wall 05/09/1946 [id number]    History:    The patient presents for breast and pelvic exam. TAH 67 for DUB. Type 2 diabetes/hypertension/hypercholesterolemia-primary care labs and meds. Right breast cancer age 67, lumpectomy with radiation. BRCA1 negative, BRCA 2 favor benign. Negative colonoscopy 2011. Normal bone density.Had syncopal incident 2 weeks ago causing 2 black eyes. Severe UTI last year causing hospitalization, and a fractured ankle from a fall November 2013.  Past medical history, past surgical history, family history and social history were all reviewed and documented in the EPIC chart. Mother: Cancer at age 48.   Exam:  Filed Vitals:   08/14/12 1416  BP: 138/84    General appearance:  Normal Head/Neck:  Normal, without cervical or supraclavicular adenopathy. Thyroid:  Symmetrical, normal in size, without palpable masses or nodularity. Respiratory  Effort:  Normal  Auscultation:  Clear without wheezing or rhonchi Cardiovascular  Auscultation:  Regular rate, without rubs, murmurs or gallops  Edema/varicosities:  Not grossly evident Abdominal  Soft,nontender, without masses, guarding or rebound.  Liver/spleen:  No organomegaly noted  Hernia:  None appreciated  Skin  Inspection:  Grossly normal  Palpation:  Grossly normal Neurologic/psychiatric  Orientation:  Normal with appropriate conversation.  Mood/affect:  Normal  Genitourinary    Breasts: Examined lying and sitting.     Right: Without masses, retractions, discharge or axillary adenopathy.     Left: Without masses, retractions, discharge or axillary adenopathy.   Inguinal/mons:  Normal without inguinal adenopathy  External genitalia:  Normal  BUS/Urethra/Skene's glands:  Normal  Bladder:  Normal  Vagina:  Atrophic  Cervix:  absent  Uterus:  Absent  Adnexa/parametria:     Rt: Without masses or tenderness.   Lt: Without masses or tenderness.  Anus and perineum: Normal  Digital rectal  exam: Normal sphincter tone without palpated masses or tenderness  Assessment/Plan:  67 y.o. MWF G3P2 for breast and pelvic exam.  TAH/DUB Right breast cancer/lumpectomy/radiation age 67. Hypertension/hypercholesterolemia/type 2 diabetes-primary care labs and meds.  Plan: Home Hemoccult card given with instructions. Continue SBE's, annual mammogram, calcium rich diet, vitamin D 2000 daily encouraged. Repeat DEXA, history of normal scans.home safety and fall prevention discussed.      Harrington Challenger [facility], 3:47 PM 08/14/2012

## 2012-08-15 ENCOUNTER — Encounter: Payer: Self-pay | Admitting: Gynecology

## 2012-08-17 ENCOUNTER — Encounter: Payer: Medicare Other | Admitting: Women's Health

## 2012-08-24 ENCOUNTER — Encounter: Payer: Self-pay | Admitting: Women's Health

## 2012-08-25 ENCOUNTER — Encounter: Payer: Self-pay | Admitting: Women's Health

## 2012-11-08 ENCOUNTER — Telehealth: Payer: Self-pay | Admitting: Family Medicine

## 2012-11-08 NOTE — Telephone Encounter (Signed)
Pt called and left mess to increase medication as instructed by provider

## 2012-11-08 NOTE — Telephone Encounter (Signed)
BP is running 140/98 to 140/110 around that for past week.  Was taking whole pill BID of 10 mg Amlodipine.  You stopped that and were told take 1/2 pill (5mg ) in AM.  Concerned now because BP up again.  What do you recommend??

## 2012-11-08 NOTE — Telephone Encounter (Signed)
BP running 140 over 98 -110 for a week.  Was told to cut back on Amlodipine from 10 mg BID to 5 mg once daily.  Please advise.

## 2012-11-08 NOTE — Telephone Encounter (Signed)
Increase amlodipine to 10mg once a day ° ° °

## 2012-11-14 ENCOUNTER — Telehealth: Payer: Self-pay | Admitting: Family Medicine

## 2012-11-14 MED ORDER — ATORVASTATIN CALCIUM 40 MG PO TABS: 40.0000 mg | ORAL_TABLET | Freq: Every day | ORAL | Status: DC

## 2012-11-14 NOTE — Telephone Encounter (Signed)
Medication refilled per protocol. 

## 2012-12-13 ENCOUNTER — Telehealth: Payer: Self-pay | Admitting: Family Medicine

## 2012-12-13 NOTE — Telephone Encounter (Signed)
?   OK to Refill  

## 2012-12-13 NOTE — Telephone Encounter (Signed)
Ok to refill, but why does she take the pain medicine?  Is it for her stomach pain?

## 2012-12-15 MED ORDER — AMLODIPINE BESYLATE 5 MG PO TABS: 5.0000 mg | ORAL_TABLET | Freq: Every day | ORAL | Status: DC

## 2012-12-15 NOTE — Telephone Encounter (Signed)
Refilled Amlodipine and pt says rx for vicodin was sent to wrong MD her ortho refilled it for her.

## 2012-12-25 ENCOUNTER — Telehealth: Payer: Self-pay | Admitting: Family Medicine

## 2012-12-25 MED ORDER — PANTOPRAZOLE SODIUM 40 MG PO TBEC: 40.0000 mg | DELAYED_RELEASE_TABLET | Freq: Every day | ORAL | Status: DC

## 2012-12-25 NOTE — Telephone Encounter (Signed)
Rx Refilled  

## 2012-12-29 IMAGING — CR DG CHEST 2V
2 series · 2 of 2 positions shown · non-contrast
Comparison: 06/06/2008

CLINICAL DATA: Nausea, vomiting, abdominal pain.

CHEST - 2 VIEW

[w chest lat]
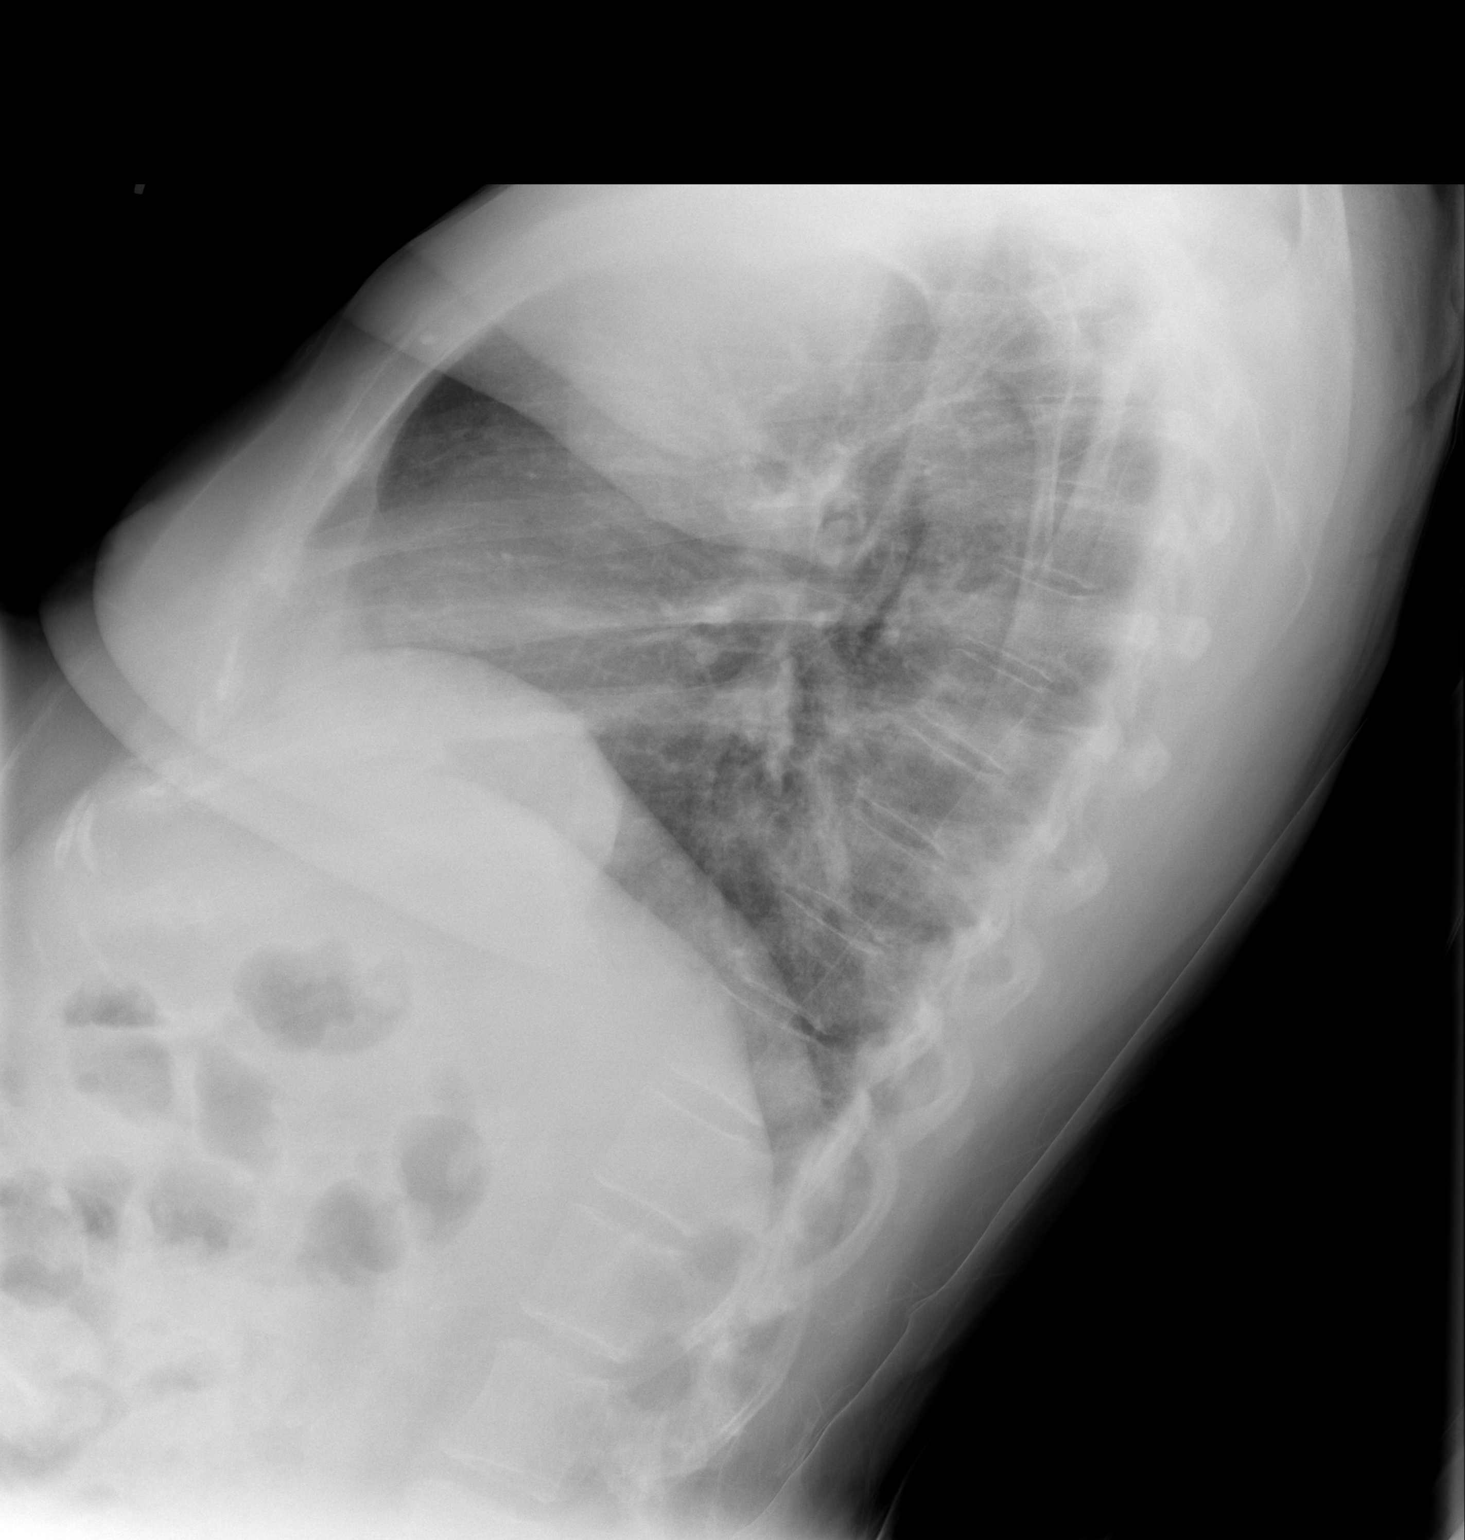

[w chest ap]
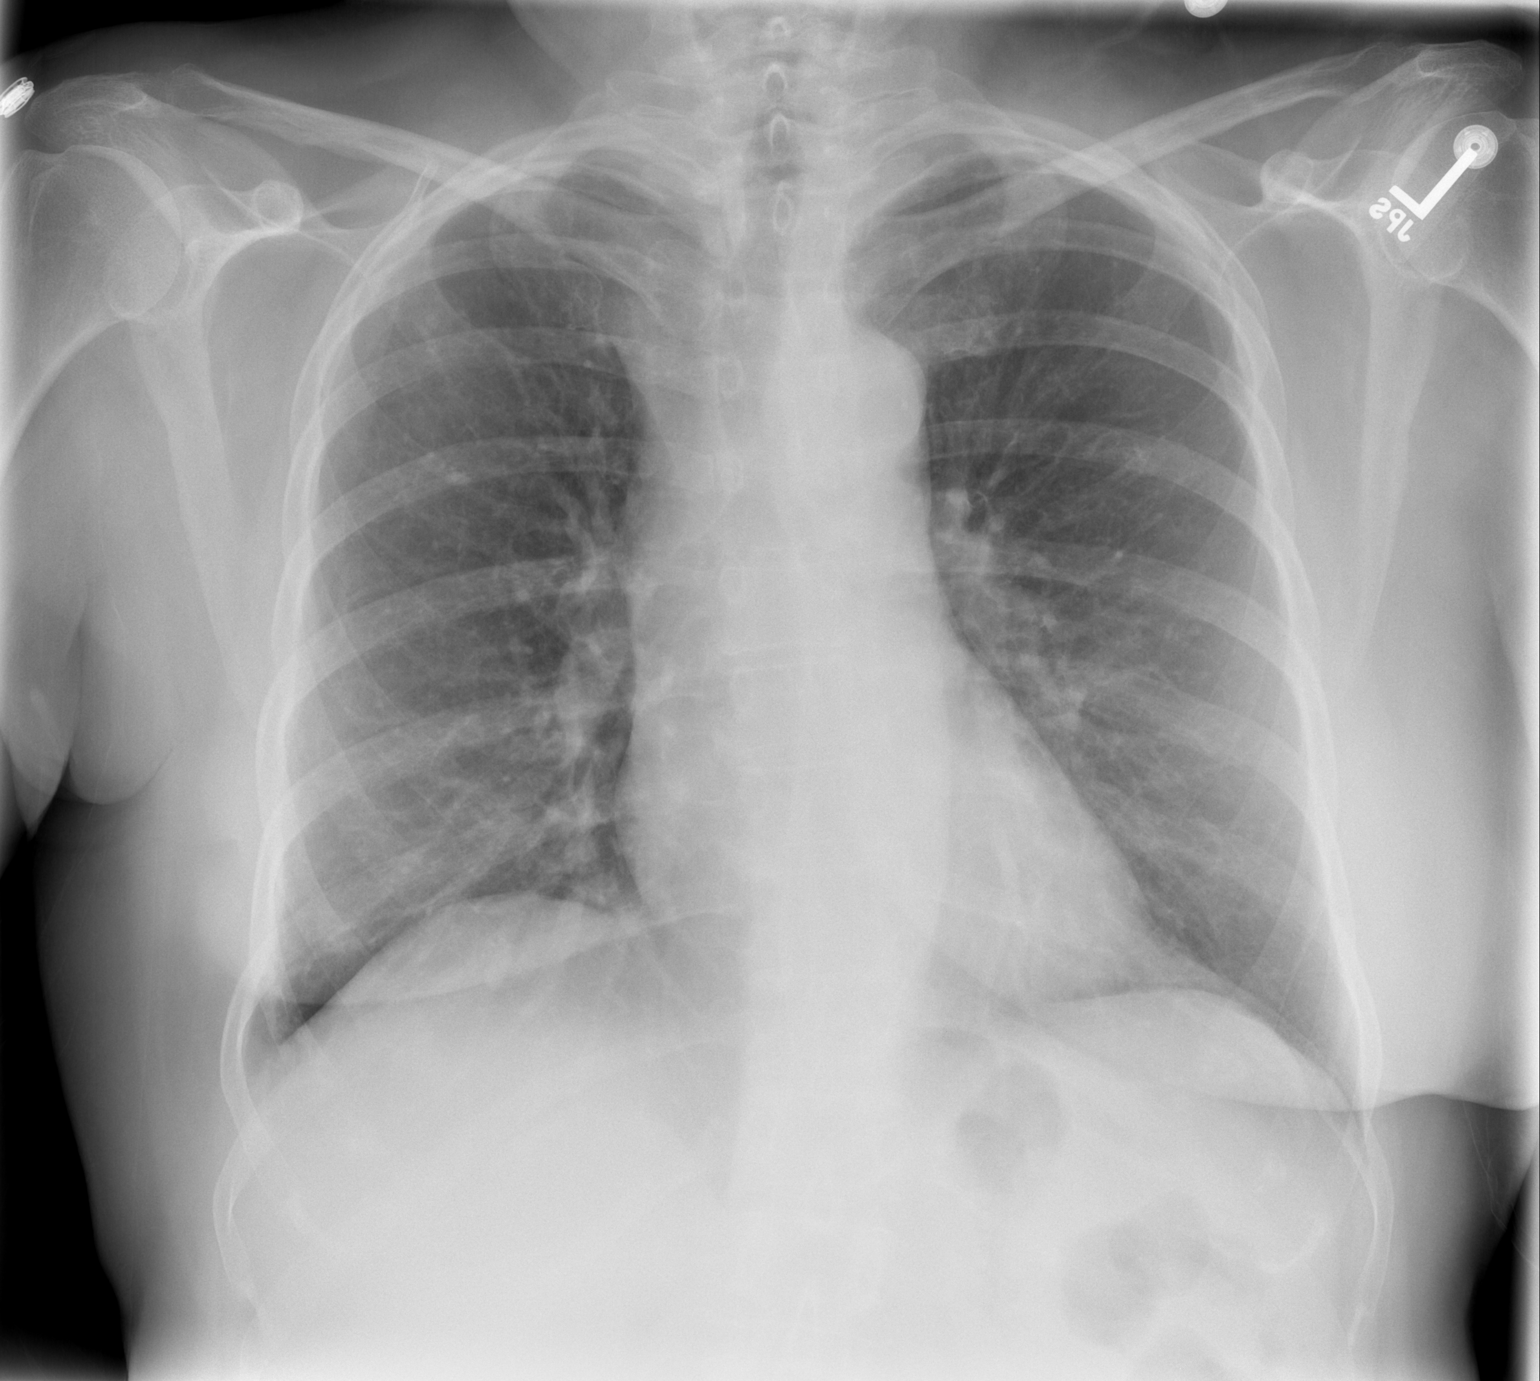

[2 of 2 positions shown; findings below may reference images not displayed]

FINDINGS: Heart is normal size.  Lungs are clear.  No effusions or
acute bony abnormality.  Slight peribronchial thickening.
IMPRESSION: Slight bronchitic changes.

## 2012-12-30 IMAGING — CR DG CHEST 1V PORT
1 series · 1 of 1 positions shown · non-contrast
Comparison: 1 day prior

CLINICAL DATA: Headache.  Cough.  Shortness of breath

PORTABLE CHEST - 1 VIEW

[view not recorded]
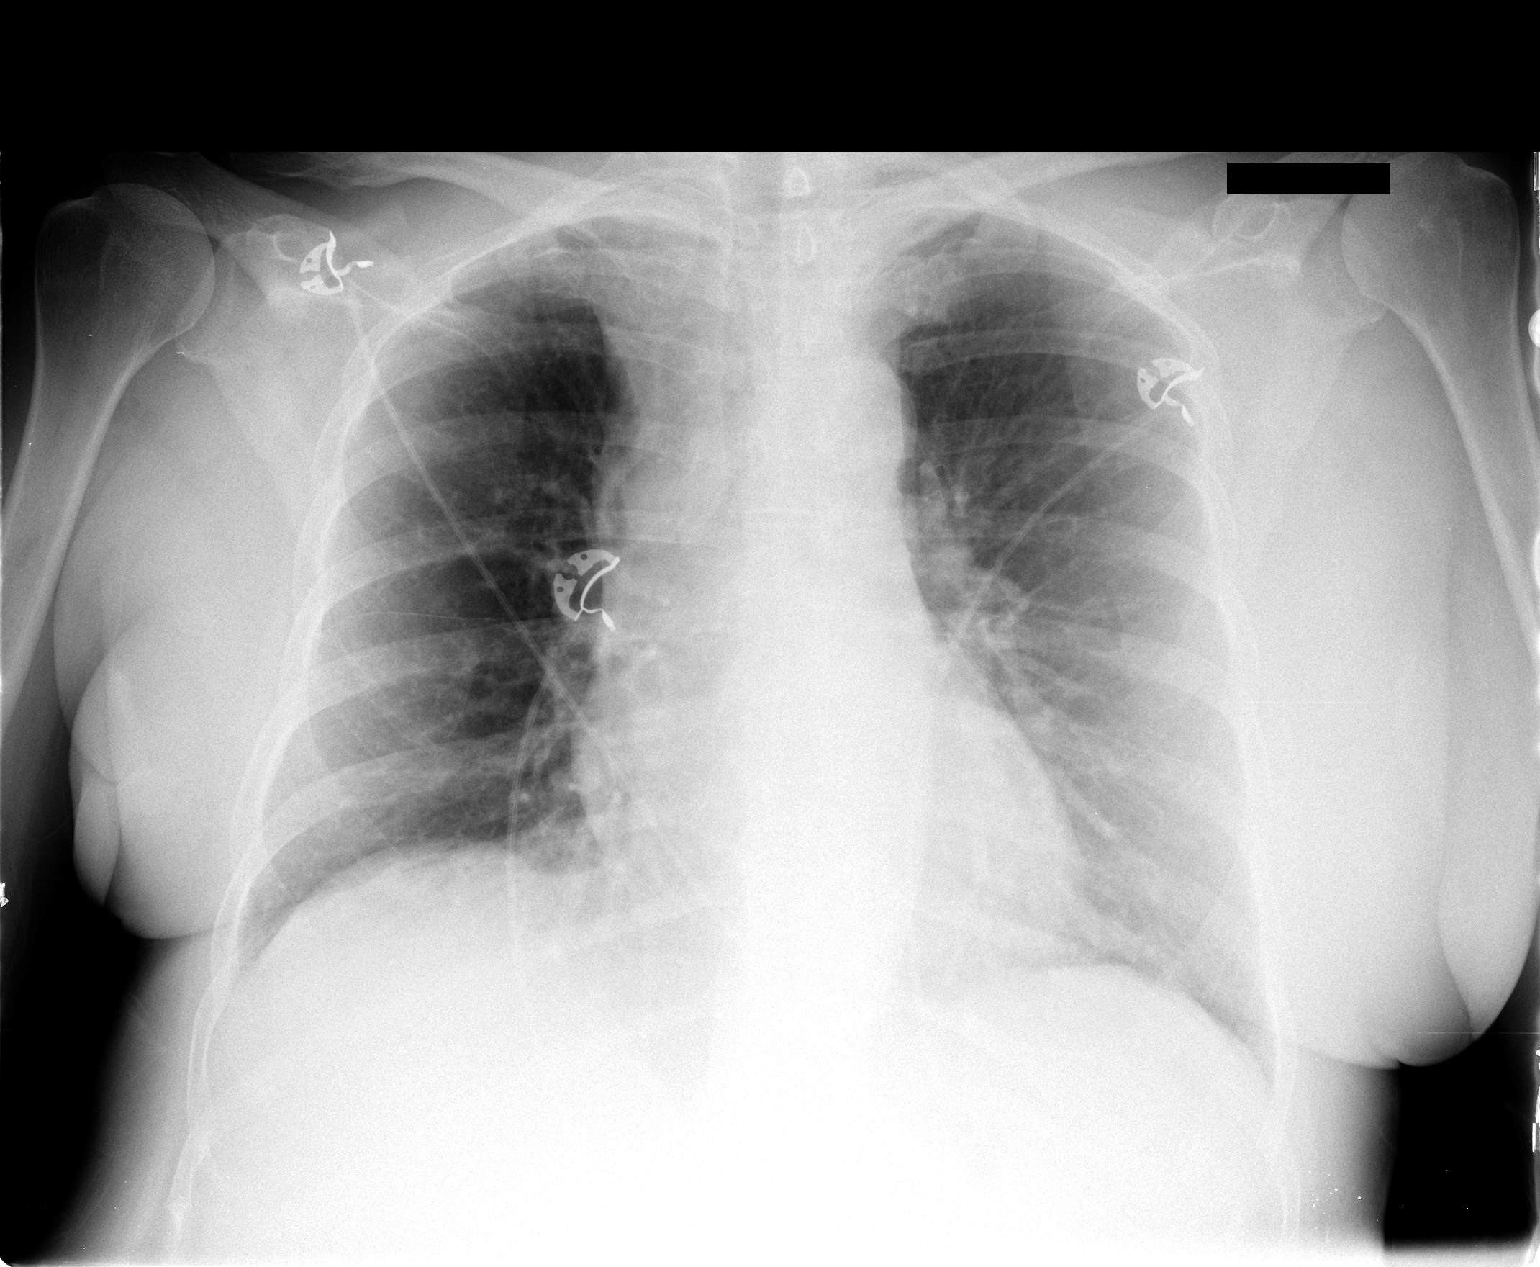

[1 of 1 positions shown; findings below may reference images not displayed]

FINDINGS: Midline trachea.  Normal heart size.  Borderline right
paratracheal soft tissue fullness is similar back to 8223 and
therefore likely secondary to great vessels. No pleural effusion or
pneumothorax.  Low lung volumes with resultant pulmonary
interstitial prominence.  Diffuse peribronchial thickening.
IMPRESSION: 1. No acute cardiopulmonary disease.
2. Mild interstitial thickening.  Question smoking or chronic
bronchitis.

## 2013-02-14 ENCOUNTER — Other Ambulatory Visit: Payer: Self-pay | Admitting: Family Medicine

## 2013-04-19 ENCOUNTER — Other Ambulatory Visit: Payer: Self-pay | Admitting: Family Medicine

## 2013-05-07 ENCOUNTER — Other Ambulatory Visit: Payer: 59

## 2013-05-07 DIAGNOSIS — R39198 Other difficulties with micturition: Secondary | ICD-10-CM

## 2013-05-07 LAB — URINALYSIS, ROUTINE W REFLEX MICROSCOPIC
Bilirubin Urine: NEGATIVE
Leukocytes, UA: NEGATIVE
Nitrite: NEGATIVE
Specific Gravity, Urine: 1.02 (ref 1.005–1.030)
Urobilinogen, UA: 0.2 mg/dL (ref 0.0–1.0)

## 2013-05-07 LAB — URINALYSIS, MICROSCOPIC ONLY
Crystals: NONE SEEN
RBC / HPF: NONE SEEN RBC/hpf (ref <3)

## 2013-05-08 ENCOUNTER — Telehealth: Payer: Self-pay | Admitting: Family Medicine

## 2013-05-08 MED ORDER — METFORMIN HCL 500 MG PO TABS: 500.0000 mg | ORAL_TABLET | Freq: Two times a day (BID) | ORAL | Status: DC

## 2013-05-08 NOTE — Telephone Encounter (Signed)
Pt did have a lot of sugar in her urine. Per pt she had not been taking her Metformin by mistake. Informed pt to restart her Metformin and follow up in 3 months and will send in rx for Metformin.

## 2013-05-08 NOTE — Telephone Encounter (Signed)
Diagnotic

## 2013-05-22 ENCOUNTER — Ambulatory Visit: Payer: 59 | Admitting: Family Medicine

## 2013-06-18 ENCOUNTER — Other Ambulatory Visit: Payer: Self-pay | Admitting: Family Medicine

## 2013-07-17 ENCOUNTER — Encounter: Payer: Self-pay | Admitting: Family Medicine

## 2013-08-06 ENCOUNTER — Ambulatory Visit: Payer: 59 | Admitting: Family Medicine

## 2013-08-08 ENCOUNTER — Ambulatory Visit (INDEPENDENT_AMBULATORY_CARE_PROVIDER_SITE_OTHER): Payer: 59 | Admitting: Family Medicine

## 2013-08-08 ENCOUNTER — Encounter: Payer: Self-pay | Admitting: Family Medicine

## 2013-08-08 VITALS — BP 140/90 | HR 86 | Temp 97.7°F | Resp 18 | Ht 64.0 in | Wt 151.0 lb

## 2013-08-08 DIAGNOSIS — Z23 Encounter for immunization: Secondary | ICD-10-CM | POA: Diagnosis not present

## 2013-08-08 DIAGNOSIS — N1831 Chronic kidney disease, stage 3a: Secondary | ICD-10-CM | POA: Insufficient documentation

## 2013-08-08 DIAGNOSIS — E785 Hyperlipidemia, unspecified: Secondary | ICD-10-CM

## 2013-08-08 DIAGNOSIS — E119 Type 2 diabetes mellitus without complications: Secondary | ICD-10-CM

## 2013-08-08 DIAGNOSIS — I10 Essential (primary) hypertension: Secondary | ICD-10-CM

## 2013-08-08 DIAGNOSIS — N183 Chronic kidney disease, stage 3 (moderate): Secondary | ICD-10-CM

## 2013-08-08 NOTE — Addendum Note (Signed)
Addended by: Shary Decamp B on: 08/08/2013 12:27 PM   Modules accepted: Orders

## 2013-08-08 NOTE — Progress Notes (Signed)
Subjective:    Patient ID: Kathryn Wall, female    DOB: 1945/10/02, 68 y.o.   MRN: [id number]  HPI Patient is here today for followup of her chronic medical conditions including hyperlipidemia, hypertension, and diabetes mellitus type 2. Currently she is not checking her blood sugars. She denies any polyuria, polydipsia, blurred vision.   She denies any hypoglycemic episodes. Today is mildly elevated at 140/90. She has a history of chronic kidney disease and therefore might impression for this patient be less than 130/80. She is currently not checking her blood pressure at home. She denies any chest pain shortness of breath or dyspnea on exertion. She denies any myalgias or right upper quadrant pain. She is due for Prevnar 13. She recently had her eye exam performed.  She is overdue for fasting lab work.  She reports a bowel movement one time per week. She is complaining of chronic constipation. She also has crampy midepigastric abdominal pain that is chronic. She is scheduled to have an EGD performed in March. Past Medical History  Diagnosis Date  . Hypertension   . High cholesterol   . Diabetes mellitus     TYPE II  . Breast cancer 1997    AGE 77, BRCA 1 NEGATIVE 2. 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 CKD (chronic kidney disease), stage III    Current Outpatient Prescriptions on File Prior to Visit  Medication Sig Dispense Refill  . ALPRAZolam (XANAX) 0.5 MG tablet Take 0.5 mg by mouth at bedtime as needed.        Marland Kitchen atorvastatin (LIPITOR) 40 MG tablet TAKE 1 TABLET BY MOUTH DAILY AT BEDTIME  90 tablet  1  . buPROPion (WELLBUTRIN XL) 150 MG 24 hr tablet Take 150 mg by mouth daily.        . cetirizine (ZYRTEC) 10 MG tablet Take 10 mg by mouth daily.        . metFORMIN (GLUCOPHAGE) 500 MG tablet Take 1 tablet (500 mg total) by mouth 2 (two) times daily with a meal.  60 tablet  3  . Multiple Vitamins-Minerals (MULTIVITAMINS THER. W/MINERALS) TABS Take 1 tablet by mouth  daily.        . pantoprazole (PROTONIX) 40 MG tablet Take 1 tablet (40 mg total) by mouth daily.  30 tablet  11  . zolpidem (AMBIEN) 10 MG tablet Take 10 mg by mouth at bedtime.         No current facility-administered medications on file prior to visit.   Allergies  Allergen Reactions  . Allergen [Antipyrine-Benzocaine]   . Cymbalta [Duloxetine Hcl]   . Other     SENSITIVE TO ANTIBIOTICS   History   Social History  . Marital Status: Married    Spouse Name: N/A    Number of Children: N/A  . Years of Education: N/A   Occupational History  . Not on file.   Social History Main Topics  . Smoking status: Never Smoker   . Smokeless tobacco: Never Used  . Alcohol Use: No  . Drug Use: No  . Sexual Activity: Yes    Birth Control/ Protection: Post-menopausal, Surgical     Comment: HYST   Other Topics Concern  . Not on file   Social History Narrative  . No narrative on file      Review of Systems  All other systems reviewed and are negative.       Objective:   Physical Exam  Constitutional: She is  oriented to person, place, and time. She appears well-developed and well-nourished. No distress.  HENT:  Mouth/Throat: Oropharynx is clear and moist. No oropharyngeal exudate.  Eyes: Conjunctivae are normal. Pupils are equal, round, and reactive to light. No scleral icterus.  Neck: Neck supple. No JVD present. No thyromegaly present.  Cardiovascular: Normal rate, regular rhythm and normal heart sounds.  Exam reveals no gallop and no friction rub.   No murmur heard. Pulmonary/Chest: Effort normal and breath sounds normal. No respiratory distress. She has no wheezes. She has no rales.  Abdominal: Soft. Bowel sounds are normal. She exhibits no distension. There is no tenderness. There is no rebound and no guarding.  Musculoskeletal: She exhibits no edema.  Lymphadenopathy:    She has no cervical adenopathy.  Neurological: She is alert and oriented to person, place, and time.  She has normal reflexes. No cranial nerve deficit. She exhibits normal muscle tone. Coordination normal.  Skin: Skin is warm. No rash noted. She is not diaphoretic.  Psychiatric: She has a normal mood and affect. Her behavior is normal. Judgment and thought content normal.          Assessment & Plan:  1. Type II or unspecified type diabetes mellitus without mention of complication, not stated as uncontrolled Check hemoglobin A1c as well as urine microalbumin. If the patient's urine microalbumin is elevated and her renal function can tolerate it I would like to start the patient on a low-dose ARB. I also recommended that the patient begin checking her fasting blood sugars and two-hour postprandial sugars more frequently. - COMPLETE METABOLIC PANEL WITH GFR; Future - Hemoglobin A1c; Future - Microalbumin, urine; Future  2. HTN (hypertension) As the patient begin checking her blood pressure at home and call me with values in 2 weeks my goal blood pressure for this patient would be 130/80 given her history of chronic kidney disease. Again I would add an ARB if needed. - COMPLETE METABOLIC PANEL WITH GFR; Future - Lipid panel; Future  3. HLD (hyperlipidemia) Check fasting lipid panel. LDL is less than 100 - COMPLETE METABOLIC PANEL WITH GFR; Future - Lipid panel; Future  I also gave patient samples of linzess 290 mg poqday to try for chronic constipation and abdominal pain.

## 2013-08-15 ENCOUNTER — Encounter: Payer: Medicare Other | Admitting: Women's Health

## 2013-08-21 ENCOUNTER — Encounter: Payer: Self-pay | Admitting: Women's Health

## 2013-08-29 ENCOUNTER — Other Ambulatory Visit: Payer: Self-pay | Admitting: Family Medicine

## 2013-08-29 ENCOUNTER — Telehealth: Payer: Self-pay | Admitting: Family Medicine

## 2013-08-29 ENCOUNTER — Other Ambulatory Visit: Payer: 59

## 2013-08-29 DIAGNOSIS — E785 Hyperlipidemia, unspecified: Secondary | ICD-10-CM

## 2013-08-29 DIAGNOSIS — I10 Essential (primary) hypertension: Secondary | ICD-10-CM

## 2013-08-29 DIAGNOSIS — E119 Type 2 diabetes mellitus without complications: Secondary | ICD-10-CM

## 2013-08-29 LAB — LIPID PANEL
CHOL/HDL RATIO: 3.8 ratio
Cholesterol: 145 mg/dL (ref 0–200)
HDL: 38 mg/dL — AB (ref >39)
LDL CALC: 50 mg/dL (ref 0–99)
Triglycerides: 283 mg/dL — ABNORMAL HIGH (ref <150)
VLDL: 57 mg/dL — AB (ref 0–40)

## 2013-08-29 LAB — COMPLETE METABOLIC PANEL WITH GFR
ALK PHOS: 96 U/L (ref 39–117)
ALT: 34 U/L (ref 0–35)
AST: 35 U/L (ref 0–37)
Albumin: 4.5 g/dL (ref 3.5–5.2)
BILIRUBIN TOTAL: 0.4 mg/dL (ref 0.2–1.2)
BUN: 16 mg/dL (ref 6–23)
CO2: 23 mEq/L (ref 19–32)
Calcium: 10 mg/dL (ref 8.4–10.5)
Chloride: 101 mEq/L (ref 96–112)
Creat: 1.13 mg/dL — ABNORMAL HIGH (ref 0.50–1.10)
GFR, Est African American: 57 mL/min — ABNORMAL LOW
GFR, Est Non African American: 50 mL/min — ABNORMAL LOW
Glucose, Bld: 217 mg/dL — ABNORMAL HIGH (ref 70–99)
POTASSIUM: 5.5 meq/L — AB (ref 3.5–5.3)
SODIUM: 138 meq/L (ref 135–145)
TOTAL PROTEIN: 6.9 g/dL (ref 6.0–8.3)

## 2013-08-29 LAB — HEMOGLOBIN A1C
HEMOGLOBIN A1C: 9.9 % — AB (ref <5.7)
Mean Plasma Glucose: 237 mg/dL — ABNORMAL HIGH (ref <117)

## 2013-08-29 LAB — MICROALBUMIN, URINE: Microalb, Ur: 14.84 mg/dL — ABNORMAL HIGH (ref 0.00–1.89)

## 2013-08-29 MED ORDER — LINACLOTIDE 290 MCG PO CAPS: 290.0000 ug | ORAL_CAPSULE | Freq: Every day | ORAL | Status: DC

## 2013-08-29 NOTE — Telephone Encounter (Signed)
Medication refilled per protocol. 

## 2013-09-03 ENCOUNTER — Encounter: Payer: Self-pay | Admitting: Women's Health

## 2013-09-03 ENCOUNTER — Other Ambulatory Visit: Payer: Self-pay | Admitting: Family Medicine

## 2013-09-03 DIAGNOSIS — E559 Vitamin D deficiency, unspecified: Secondary | ICD-10-CM

## 2013-09-03 DIAGNOSIS — Z1211 Encounter for screening for malignant neoplasm of colon: Secondary | ICD-10-CM

## 2013-09-04 ENCOUNTER — Encounter: Payer: Self-pay | Admitting: Family Medicine

## 2013-09-04 ENCOUNTER — Ambulatory Visit (INDEPENDENT_AMBULATORY_CARE_PROVIDER_SITE_OTHER): Payer: 59 | Admitting: Family Medicine

## 2013-09-04 VITALS — BP 130/72 | HR 74 | Temp 97.0°F | Resp 18 | Ht 64.0 in | Wt 148.0 lb

## 2013-09-04 DIAGNOSIS — IMO0001 Reserved for inherently not codable concepts without codable children: Secondary | ICD-10-CM

## 2013-09-04 DIAGNOSIS — E1165 Type 2 diabetes mellitus with hyperglycemia: Principal | ICD-10-CM

## 2013-09-04 LAB — HEMOGLOBIN A1C
HEMOGLOBIN A1C: 9.3 % — AB (ref <5.7)
Mean Plasma Glucose: 220 mg/dL — ABNORMAL HIGH (ref <117)

## 2013-09-04 NOTE — Progress Notes (Signed)
Subjective:    Patient ID: Kathryn Wall, female    DOB: [birth year], 68 y.o.   MRN: [id number]  HPI Patient is a very pleasant 68 year old white female with a history of diabetes mellitus type 2. Is previously been well controlled with metformin 500 mg by mouth twice a day. Recently I obtain hemoglobin A1c that had become elevated to 9.9 the patient denies any change in her diet. She's not currently taking prednisone her other medications elevated blood sugar. She states is using a low carbohydrate diet. She's not been exercising like she should but she is not sure how her blood sugar could have risen so substantially. Past Medical History  Diagnosis Date  . Hypertension   . High cholesterol   . Diabetes mellitus     TYPE II  . Breast cancer 1997    AGE 9, BRCA 1 NEGATIVE 2. 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 CKD (chronic kidney disease), stage III    Current Outpatient Prescriptions on File Prior to Visit  Medication Sig Dispense Refill  . ALPRAZolam (XANAX) 0.5 MG tablet Take 0.5 mg by mouth at bedtime as needed.        Marland Kitchen amLODipine (NORVASC) 5 MG tablet Take 5 mg by mouth daily.      Marland Kitchen atorvastatin (LIPITOR) 40 MG tablet TAKE 1 TABLET BY MOUTH DAILY AT BEDTIME  90 tablet  1  . buPROPion (WELLBUTRIN XL) 150 MG 24 hr tablet Take 150 mg by mouth daily.        . cetirizine (ZYRTEC) 10 MG tablet Take 10 mg by mouth daily.        . Linaclotide (LINZESS) 290 MCG CAPS capsule Take 1 capsule (290 mcg total) by mouth daily.  30 capsule  2  . metFORMIN (GLUCOPHAGE) 500 MG tablet Take 1 tablet (500 mg total) by mouth 2 (two) times daily with a meal.  60 tablet  3  . Multiple Vitamins-Minerals (MULTIVITAMINS THER. W/MINERALS) TABS Take 1 tablet by mouth daily.        . pantoprazole (PROTONIX) 40 MG tablet Take 1 tablet (40 mg total) by mouth daily.  30 tablet  11  . zolpidem (AMBIEN) 10 MG tablet Take 10 mg by mouth at bedtime.         No current facility-administered  medications on file prior to visit.   Allergies  Allergen Reactions  . Allergen [Antipyrine-Benzocaine]   . Cymbalta [Duloxetine Hcl]   . Other     SENSITIVE TO ANTIBIOTICS   History   Social History  . Marital Status: Married    Spouse Name: N/A    Number of Children: N/A  . Years of Education: N/A   Occupational History  . Not on file.   Social History Main Topics  . Smoking status: Never Smoker   . Smokeless tobacco: Never Used  . Alcohol Use: No  . Drug Use: No  . Sexual Activity: Yes    Birth Control/ Protection: Post-menopausal, Surgical     Comment: HYST   Other Topics Concern  . Not on file   Social History Narrative  . No narrative on file      Review of Systems  All other systems reviewed and are negative.       Objective:   Physical Exam  Vitals reviewed. Constitutional: She appears well-developed and well-nourished.  Cardiovascular: Normal rate, regular rhythm and normal heart sounds.   No murmur heard. Pulmonary/Chest: Effort normal and breath sounds  normal. No respiratory distress. She has no wheezes. She has no rales.  Abdominal: Soft. Bowel sounds are normal. She exhibits no distension. There is no tenderness. There is no rebound.          Assessment & Plan:  1. Type II or unspecified type diabetes mellitus without mention of complication, uncontrolled Discontinue metformin. Start Janumet Bexxar 50/100, 2 tablets by mouth daily. Check fasting blood sugars and two-hour postprandial sugars for the next month. Recheck in one month. If her two-hour postprandial sugars are not less than 200 or her fasting blood sugars are not less than 130 in one month I would add invokana. - Hemoglobin A1c

## 2013-09-11 ENCOUNTER — Other Ambulatory Visit: Payer: Self-pay | Admitting: Gastroenterology

## 2013-09-26 ENCOUNTER — Encounter: Payer: Self-pay | Admitting: Women's Health

## 2013-09-26 ENCOUNTER — Encounter: Payer: Self-pay | Admitting: Family Medicine

## 2013-09-26 ENCOUNTER — Ambulatory Visit (INDEPENDENT_AMBULATORY_CARE_PROVIDER_SITE_OTHER): Payer: 59 | Admitting: Women's Health

## 2013-09-26 VITALS — BP 134/80 | Ht 65.0 in | Wt 146.4 lb

## 2013-09-26 DIAGNOSIS — N952 Postmenopausal atrophic vaginitis: Secondary | ICD-10-CM

## 2013-09-26 NOTE — Progress Notes (Signed)
Kathryn Wall 05-19-1946 [id number]    History:    Presents for breast and pelvic exam. TAH for DUB. 2007 breast cancer with lumpectomy, radiation and normal mammograms after.   BRCA 1 negative BRCA 2  favor benign initially now negative. DEXA normal, primary care manages. Fractured ankle 04/2012 from fall. 08/2013 colonoscopy- diverticuli, endoscopy showed gastritis with no malignancy. Normal labs at primary care. Current on immunizations. Not sexually active  Past medical history, past surgical history, family history and social history were all reviewed and documented in the EPIC chart. 2 daughters both doing well. Mother colon cancer age 103,  ROS:  A  ROS was performed and pertinent positives and negatives are included.  Exam:  Filed Vitals:   09/26/13 1512  BP: 134/80    General appearance:  Normal Thyroid:  Symmetrical, normal in size, without palpable masses or nodularity. Respiratory  Auscultation:  Clear without wheezing or rhonchi Cardiovascular  Auscultation:  Regular rate, without rubs, murmurs or gallops  Edema/varicosities:  Not grossly evident Abdominal  Soft,nontender, without masses, guarding or rebound.  Liver/spleen:  No organomegaly noted  Hernia:  None appreciated  Skin  Inspection:  Grossly normal   Breasts: Examined lying and sitting.     Right: Status post lumpectomy smaller than left     Left: Without masses, retractions, discharge or axillary adenopathy. Gentitourinary   Inguinal/mons:  Normal without inguinal adenopathy  External genitalia:  Normal  BUS/Urethra/Skene's glands:  Normal  Vagina:  Atrophic  Cervix:  Absent Uterus:  Absent  Adnexa/parametria:     Rt: Without masses or tenderness.   Lt: Without masses or tenderness.  Anus and perineum: Normal  Digital rectal exam: Normal sphincter tone without palpated masses or tenderness  Assessment/Plan:  68 y.o. MWF G2P2 for breast and pelvic exam.  TAH for DUB Right breast cancer  2007 Hypertension/hypercholesterolemia/diabetes-primary care manages labs and meds  Plan: SBE's, continue annual mammogram, calcium rich diet, vitamin D 2000 daily encouraged. Scheduled for repeat DEXA will fax results here. Reviewed importance of exercise, weightbearing especially, home safety and fall prevention discussed. Pap normal 2013, new screening guidelines reviewed.   Huel Cote [facility], 3:40 PM 09/26/2013

## 2013-09-26 NOTE — Patient Instructions (Signed)
Health Recommendations for Postmenopausal Women Respected and ongoing research has looked at the most common causes of death, disability, and poor quality of life in postmenopausal women. The causes include heart disease, diseases of blood vessels, diabetes, depression, cancer, and bone loss (osteoporosis). Many things can be done to help lower the chances of developing these and other common problems: CARDIOVASCULAR DISEASE Heart Disease: A heart attack is a medical emergency. Know the signs and symptoms of a heart attack. Below are things women can do to reduce their risk for heart disease.   Do not smoke. If you smoke, quit.  Aim for a healthy weight. Being overweight causes many preventable deaths. Eat a healthy and balanced diet and drink an adequate amount of liquids.  Get moving. Make a commitment to be more physically active. Aim for 30 minutes of activity on most, if not all days of the week.  Eat for heart health. Choose a diet that is low in saturated fat and cholesterol and eliminate trans fat. Include whole grains, vegetables, and fruits. Read and understand the labels on food containers before buying.  Know your numbers. Ask your caregiver to check your blood pressure, cholesterol (total, HDL, LDL, triglycerides) and blood glucose. Work with your caregiver on improving your entire clinical picture.  High blood pressure. Limit or stop your table salt intake (try salt substitute and food seasonings). Avoid salty foods and drinks. Read labels on food containers before buying. Eating well and exercising can help control high blood pressure. STROKE  Stroke is a medical emergency. Stroke may be the result of a blood clot in a blood vessel in the brain or by a brain hemorrhage (bleeding). Know the signs and symptoms of a stroke. To lower the risk of developing a stroke:  Avoid fatty foods.  Quit smoking.  Control your diabetes, blood pressure, and irregular heart rate. THROMBOPHLEBITIS  (BLOOD CLOT) OF THE LEG  Becoming overweight and leading a stationary lifestyle may also contribute to developing blood clots. Controlling your diet and exercising will help lower the risk of developing blood clots. CANCER SCREENING  Breast Cancer: Take steps to reduce your risk of breast cancer.  You should practice "breast self-awareness." This means understanding the normal appearance and feel of your breasts and should include breast self-examination. Any changes detected, no matter how small, should be reported to your caregiver.  After age 40, you should have a clinical breast exam (CBE) every year.  Starting at age 40, you should consider having a mammogram (breast X-ray) every year.  If you have a family history of breast cancer, talk to your caregiver about genetic screening.  If you are at high risk for breast cancer, talk to your caregiver about having an MRI and a mammogram every year.  Intestinal or Stomach Cancer: Tests to consider are a rectal exam, fecal occult blood, sigmoidoscopy, and colonoscopy. Women who are high risk may need to be screened at an earlier age and more often.  Cervical Cancer:  Beginning at age 30, you should have a Pap test every 3 years as long as the past 3 Pap tests have been normal.  If you have had past treatment for cervical cancer or a condition that could lead to cancer, you need Pap tests and screening for cancer for at least 20 years after your treatment.  If you had a hysterectomy for a problem that was not cancer or a condition that could lead to cancer, then you no longer need Pap tests.    If you are between ages 65 and 70, and you have had normal Pap tests going back 10 years, you no longer need Pap tests.  If Pap tests have been discontinued, risk factors (such as a new sexual partner) need to be reassessed to determine if screening should be resumed.  Some medical problems can increase the chance of getting cervical cancer. In these  cases, your caregiver may recommend more frequent screening and Pap tests.  Uterine Cancer: If you have vaginal bleeding after reaching menopause, you should notify your caregiver.  Ovarian cancer: Other than yearly pelvic exams, there are no reliable tests available to screen for ovarian cancer at this time except for yearly pelvic exams.  Lung Cancer: Yearly chest X-rays can detect lung cancer and should be done on high risk women, such as cigarette smokers and women with chronic lung disease (emphysema).  Skin Cancer: A complete body skin exam should be done at your yearly examination. Avoid overexposure to the sun and ultraviolet light lamps. Use a strong sun block cream when in the sun. All of these things are important in lowering the risk of skin cancer. MENOPAUSE Menopause Symptoms: Hormone therapy products are effective for treating symptoms associated with menopause:  Moderate to severe hot flashes.  Night sweats.  Mood swings.  Headaches.  Tiredness.  Loss of sex drive.  Insomnia.  Other symptoms. Hormone replacement carries certain risks, especially in older women. Women who use or are thinking about using estrogen or estrogen with progestin treatments should discuss that with their caregiver. Your caregiver will help you understand the benefits and risks. The ideal dose of hormone replacement therapy is not known. The Food and Drug Administration (FDA) has concluded that hormone therapy should be used only at the lowest doses and for the shortest amount of time to reach treatment goals.  OSTEOPOROSIS Protecting Against Bone Loss and Preventing Fracture: If you use hormone therapy for prevention of bone loss (osteoporosis), the risks for bone loss must outweigh the risk of the therapy. Ask your caregiver about other medications known to be safe and effective for preventing bone loss and fractures. To guard against bone loss or fractures, the following is recommended:  If  you are less than age 50, take 1000 mg of calcium and at least 600 mg of Vitamin D per day.  If you are greater than age 50 but less than age 70, take 1200 mg of calcium and at least 600 mg of Vitamin D per day.  If you are greater than age 70, take 1200 mg of calcium and at least 800 mg of Vitamin D per day. Smoking and excessive alcohol intake increases the risk of osteoporosis. Eat foods rich in calcium and vitamin D and do weight bearing exercises several times a week as your caregiver suggests. DIABETES Diabetes Melitus: If you have Type I or Type 2 diabetes, you should keep your blood sugar under control with diet, exercise and recommended medication. Avoid too many sweets, starchy and fatty foods. Being overweight can make control more difficult. COGNITION AND MEMORY Cognition and Memory: Menopausal hormone therapy is not recommended for the prevention of cognitive disorders such as Alzheimer's disease or memory loss.  DEPRESSION  Depression may occur at any age, but is common in elderly women. The reasons may be because of physical, medical, social (loneliness), or financial problems and needs. If you are experiencing depression because of medical problems and control of symptoms, talk to your caregiver about this. Physical activity and   exercise may help with mood and sleep. Community and volunteer involvement may help your sense of value and worth. If you have depression and you feel that the problem is getting worse or becoming severe, talk to your caregiver about treatment options that are best for you. ACCIDENTS  Accidents are common and can be serious in the elderly woman. Prepare your house to prevent accidents. Eliminate throw rugs, place hand bars in the bath, shower and toilet areas. Avoid wearing high heeled shoes or walking on wet, snowy, and icy areas. Limit or stop driving if you have vision or hearing problems, or you feel you are unsteady with you movements and  reflexes. HEPATITIS C Hepatitis C is a type of viral infection affecting the liver. It is spread mainly through contact with blood from an infected person. It can be treated, but if left untreated, it can lead to severe liver damage over years. Many people who are infected do not know that the virus is in their blood. If you are a "baby-boomer", it is recommended that you have one screening test for Hepatitis C. IMMUNIZATIONS  Several immunizations are important to consider having during your senior years, including:   Tetanus, diptheria, and pertussis booster shot.  Influenza every year before the flu season begins.  Pneumonia vaccine.  Shingles vaccine.  Others as indicated based on your specific needs. Talk to your caregiver about these. Document Released: 08/06/2005 Document Revised: 05/31/2012 Document Reviewed: 04/01/2008 [employer] Patient Information 2014 [employer].  

## 2013-10-05 ENCOUNTER — Encounter: Payer: Self-pay | Admitting: Family Medicine

## 2013-10-08 ENCOUNTER — Ambulatory Visit (INDEPENDENT_AMBULATORY_CARE_PROVIDER_SITE_OTHER): Payer: 59 | Admitting: Family Medicine

## 2013-10-08 ENCOUNTER — Encounter: Payer: Self-pay | Admitting: Family Medicine

## 2013-10-08 VITALS — BP 146/100 | HR 76 | Temp 97.0°F | Resp 18 | Ht 65.0 in | Wt 147.0 lb

## 2013-10-08 DIAGNOSIS — IMO0001 Reserved for inherently not codable concepts without codable children: Secondary | ICD-10-CM

## 2013-10-08 DIAGNOSIS — I10 Essential (primary) hypertension: Secondary | ICD-10-CM

## 2013-10-08 DIAGNOSIS — E1165 Type 2 diabetes mellitus with hyperglycemia: Principal | ICD-10-CM

## 2013-10-08 MED ORDER — SITAGLIPTIN PHOS-METFORMIN HCL 50-1000 MG PO TABS: 1.0000 | ORAL_TABLET | Freq: Two times a day (BID) | ORAL | Status: DC

## 2013-10-08 MED ORDER — PIOGLITAZONE HCL 30 MG PO TABS: 30.0000 mg | ORAL_TABLET | Freq: Every day | ORAL | Status: DC

## 2013-10-08 MED ORDER — VALSARTAN 160 MG PO TABS: 160.0000 mg | ORAL_TABLET | Freq: Every day | ORAL | Status: DC

## 2013-10-08 NOTE — Progress Notes (Signed)
Subjective:    Patient ID: Kathryn Wall, female    DOB: 01-22-1946, 68 y.o.   MRN: [id number]  HPI 09/04/13 Patient is a very pleasant 68 year old white female with a history of diabetes mellitus type 2. Is previously been well controlled with metformin 500 mg by mouth twice a day. Recently I obtain hemoglobin A1c that had become elevated to 9.9 the patient denies any change in her diet. She's not currently taking prednisone her other medications elevated blood sugar. She states is using a low carbohydrate diet. She's not been exercising like she should but she is not sure how her blood sugar could have risen so substantially.  At that time, my plan was: 1. Type II or unspecified type diabetes mellitus without mention of complication, uncontrolled Discontinue metformin. Start Janumet Bexxar 50/100, 2 tablets by mouth daily. Check fasting blood sugars and two-hour postprandial sugars for the next month. Recheck in one month. If her two-hour postprandial sugars are not less than 200 or her fasting blood sugars are not less than 130 in one month I would add invokana. - Hemoglobin A1c  10/08/13 Patient's sugars have been ranging 150-200 both fasting and postprandially. Her blood pressure has been elevated at 140-150/90-100.  Past Medical History  Diagnosis Date  . Hypertension   . High cholesterol   . Diabetes mellitus     TYPE II  . Breast cancer 1997    AGE 68, BRCA 1 NEGATIVE 2. 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 CKD (chronic kidney disease), stage III    Current Outpatient Prescriptions on File Prior to Visit  Medication Sig Dispense Refill  . ALPRAZolam (XANAX) 0.5 MG tablet Take 0.5 mg by mouth at bedtime as needed.        Marland Kitchen amLODipine (NORVASC) 5 MG tablet Take 5 mg by mouth daily.      Marland Kitchen atorvastatin (LIPITOR) 40 MG tablet TAKE 1 TABLET BY MOUTH DAILY AT BEDTIME  90 tablet  1  . buPROPion (WELLBUTRIN XL) 150 MG 24 hr tablet Take 150 mg by mouth daily.          . cetirizine (ZYRTEC) 10 MG tablet Take 10 mg by mouth daily.        . Linaclotide (LINZESS) 290 MCG CAPS capsule Take 1 capsule (290 mcg total) by mouth daily.  30 capsule  2  . Multiple Vitamins-Minerals (MULTIVITAMINS THER. W/MINERALS) TABS Take 1 tablet by mouth daily.        . pantoprazole (PROTONIX) 40 MG tablet Take 1 tablet (40 mg total) by mouth daily.  30 tablet  11  . zolpidem (AMBIEN) 10 MG tablet Take 10 mg by mouth at bedtime.         No current facility-administered medications on file prior to visit.   Allergies  Allergen Reactions  . Allergen [Antipyrine-Benzocaine]   . Cymbalta [Duloxetine Hcl]   . Other     SENSITIVE TO ANTIBIOTICS   History   Social History  . Marital Status: Married    Spouse Name: N/A    Number of Children: N/A  . Years of Education: N/A   Occupational History  . Not on file.   Social History Main Topics  . Smoking status: Never Smoker   . Smokeless tobacco: Never Used  . Alcohol Use: No  . Drug Use: No  . Sexual Activity: Yes    Birth Control/ Protection: Post-menopausal, Surgical     Comment: HYST   Other Topics Concern  .  Not on file   Social History Narrative  . No narrative on file      Review of Systems  All other systems reviewed and are negative.      Objective:   Physical Exam  Vitals reviewed. Constitutional: She appears well-developed and well-nourished.  Cardiovascular: Normal rate, regular rhythm and normal heart sounds.   No murmur heard. Pulmonary/Chest: Effort normal and breath sounds normal. No respiratory distress. She has no wheezes. She has no rales.  Abdominal: Soft. Bowel sounds are normal. She exhibits no distension. There is no tenderness. There is no rebound.          Assessment & Plan:  1. Type II or unspecified type diabetes mellitus without mention of complication, uncontrolled Add actos to janumet 50/1000 and recheck HgA1c in 3 months. - sitaGLIPtin-metformin (JANUMET) 50-1000 MG  per tablet; Take 1 tablet by mouth 2 (two) times daily with a meal.  Dispense: 60 tablet; Refill: 5 - pioglitazone (ACTOS) 30 MG tablet; Take 1 tablet (30 mg total) by mouth daily.  Dispense: 30 tablet; Refill: 5  2. HTN (hypertension) Add diovan 160 mg poqday and recheck bp in 1 month. - valsartan (DIOVAN) 160 MG tablet; Take 1 tablet (160 mg total) by mouth daily.  Dispense: 30 tablet; Refill: 5

## 2013-10-15 ENCOUNTER — Encounter: Payer: Self-pay | Admitting: Family Medicine

## 2013-10-15 ENCOUNTER — Ambulatory Visit: Payer: 59 | Admitting: Family Medicine

## 2013-10-18 ENCOUNTER — Ambulatory Visit (INDEPENDENT_AMBULATORY_CARE_PROVIDER_SITE_OTHER): Payer: 59 | Admitting: Family Medicine

## 2013-10-18 DIAGNOSIS — Z23 Encounter for immunization: Secondary | ICD-10-CM

## 2013-10-26 ENCOUNTER — Ambulatory Visit: Payer: 59

## 2014-01-02 ENCOUNTER — Other Ambulatory Visit: Payer: Self-pay | Admitting: Family Medicine

## 2014-01-04 ENCOUNTER — Ambulatory Visit (INDEPENDENT_AMBULATORY_CARE_PROVIDER_SITE_OTHER): Payer: 59 | Admitting: Family Medicine

## 2014-01-04 ENCOUNTER — Encounter: Payer: Self-pay | Admitting: Family Medicine

## 2014-01-04 VITALS — BP 130/90 | HR 76 | Temp 97.0°F | Resp 18 | Ht 65.0 in | Wt 155.0 lb

## 2014-01-04 DIAGNOSIS — E119 Type 2 diabetes mellitus without complications: Secondary | ICD-10-CM

## 2014-01-04 NOTE — Progress Notes (Signed)
Subjective:    Patient ID: Kathryn Wall, female    DOB: [birth year], 68 y.o.   MRN: [id number]  HPI Patient is here today for recheck of her diabetes. At her last office visit I started the patient on Actos 30 mg by mouth daily in addition to janumet 50/1000 bid.  She states her sugars have been excellent ever since. Her two-hour postprandial sugars are less than 120. Her fasting blood sugars are less than 130. She denies any hypoglycemic episodes. Pressure is also improved on Diovan 160 mg by mouth daily. Past Medical History  Diagnosis Date  . Hypertension   . High cholesterol   . Diabetes mellitus     TYPE II  . Breast cancer 1997    AGE 19, BRCA 1 NEGATIVE 2. 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 CKD (chronic kidney disease), stage III    Current Outpatient Prescriptions on File Prior to Visit  Medication Sig Dispense Refill  . ALPRAZolam (XANAX) 0.5 MG tablet Take 0.5 mg by mouth at bedtime as needed.        Marland Kitchen amLODipine (NORVASC) 5 MG tablet Take 5 mg by mouth daily.      Marland Kitchen atorvastatin (LIPITOR) 40 MG tablet TAKE 1 TABLET BY MOUTH DAILY AT BEDTIME  90 tablet  1  . buPROPion (WELLBUTRIN XL) 150 MG 24 hr tablet Take 150 mg by mouth daily.        . cetirizine (ZYRTEC) 10 MG tablet Take 10 mg by mouth daily.        . Linaclotide (LINZESS) 290 MCG CAPS capsule Take 1 capsule (290 mcg total) by mouth daily.  30 capsule  2  . Multiple Vitamins-Minerals (MULTIVITAMINS THER. W/MINERALS) TABS Take 1 tablet by mouth daily.        . pantoprazole (PROTONIX) 40 MG tablet TAKE 1 TABLET BY MOUTH EVERY MORNING  30 tablet  11  . pioglitazone (ACTOS) 30 MG tablet Take 1 tablet (30 mg total) by mouth daily.  30 tablet  5  . sitaGLIPtin-metformin (JANUMET) 50-1000 MG per tablet Take 1 tablet by mouth 2 (two) times daily with a meal.  60 tablet  5  . valsartan (DIOVAN) 160 MG tablet Take 1 tablet (160 mg total) by mouth daily.  30 tablet  5  . zolpidem (AMBIEN) 10 MG tablet  Take 10 mg by mouth at bedtime.         No current facility-administered medications on file prior to visit.   Allergies  Allergen Reactions  . Allergen [Antipyrine-Benzocaine]   . Cymbalta [Duloxetine Hcl]   . Other     SENSITIVE TO ANTIBIOTICS   History   Social History  . Marital Status: Married    Spouse Name: N/A    Number of Children: N/A  . Years of Education: N/A   Occupational History  . Not on file.   Social History Main Topics  . Smoking status: Never Smoker   . Smokeless tobacco: Never Used  . Alcohol Use: No  . Drug Use: No  . Sexual Activity: Yes    Birth Control/ Protection: Post-menopausal, Surgical     Comment: HYST   Other Topics Concern  . Not on file   Social History Narrative  . No narrative on file     Review of Systems  All other systems reviewed and are negative.      Objective:   Physical Exam  Vitals reviewed. Constitutional: She is oriented to person, place,  and time. She appears well-developed and well-nourished.  Neck: Neck supple. No JVD present.  Cardiovascular: Normal rate, regular rhythm, normal heart sounds and intact distal pulses.  Exam reveals no gallop.   No murmur heard. Pulmonary/Chest: Effort normal and breath sounds normal. No respiratory distress. She has no wheezes. She has no rales.  Abdominal: Soft. Bowel sounds are normal. She exhibits no distension. There is no tenderness. There is no rebound and no guarding.  Musculoskeletal: She exhibits no edema.  Lymphadenopathy:    She has no cervical adenopathy.  Neurological: She is alert and oriented to person, place, and time. She has normal reflexes. She displays normal reflexes. No cranial nerve deficit. Coordination normal.  Skin: Skin is warm. No rash noted. No erythema. No pallor.          Assessment & Plan:  1. Type II or unspecified type diabetes mellitus without mention of complication, not stated as uncontrolled Recheck hemoglobin A1c.  Hemoglobin A1c  is less than 7.0. Blood pressure is now well controlled. I will also check a fasting lipid panel. Goal LDL is less than 100. Also check a urine microalbumin and increase Diovan if microalbumin is elevated. - Hemoglobin A1c - Lipid panel - Microalbumin, urine - COMPLETE METABOLIC PANEL WITH GFR

## 2014-01-05 LAB — COMPLETE METABOLIC PANEL WITH GFR
ALT: 22 U/L (ref 0–35)
AST: 24 U/L (ref 0–37)
Albumin: 4.4 g/dL (ref 3.5–5.2)
Alkaline Phosphatase: 65 U/L (ref 39–117)
BUN: 26 mg/dL — ABNORMAL HIGH (ref 6–23)
CO2: 22 meq/L (ref 19–32)
Calcium: 9.4 mg/dL (ref 8.4–10.5)
Chloride: 105 mEq/L (ref 96–112)
Creat: 1.32 mg/dL — ABNORMAL HIGH (ref 0.50–1.10)
GFR, EST AFRICAN AMERICAN: 47 mL/min — AB
GFR, Est Non African American: 41 mL/min — ABNORMAL LOW
Glucose, Bld: 100 mg/dL — ABNORMAL HIGH (ref 70–99)
Potassium: 5 mEq/L (ref 3.5–5.3)
Sodium: 137 mEq/L (ref 135–145)
TOTAL PROTEIN: 6.9 g/dL (ref 6.0–8.3)
Total Bilirubin: 0.4 mg/dL (ref 0.2–1.2)

## 2014-01-05 LAB — HEMOGLOBIN A1C
Hgb A1c MFr Bld: 6 % — ABNORMAL HIGH (ref <5.7)
MEAN PLASMA GLUCOSE: 126 mg/dL — AB (ref <117)

## 2014-01-05 LAB — LIPID PANEL
Cholesterol: 170 mg/dL (ref 0–200)
HDL: 57 mg/dL (ref >39)
LDL Cholesterol: 78 mg/dL (ref 0–99)
Total CHOL/HDL Ratio: 3 Ratio
Triglycerides: 176 mg/dL — ABNORMAL HIGH (ref <150)
VLDL: 35 mg/dL (ref 0–40)

## 2014-01-05 LAB — MICROALBUMIN, URINE: Microalb, Ur: 1.12 mg/dL (ref 0.00–1.89)

## 2014-01-07 ENCOUNTER — Encounter: Payer: Self-pay | Admitting: Family Medicine

## 2014-03-05 ENCOUNTER — Other Ambulatory Visit: Payer: Self-pay | Admitting: Family Medicine

## 2014-03-30 ENCOUNTER — Other Ambulatory Visit: Payer: Self-pay | Admitting: Family Medicine

## 2014-04-02 ENCOUNTER — Encounter: Payer: Self-pay | Admitting: Women's Health

## 2014-04-02 ENCOUNTER — Ambulatory Visit (INDEPENDENT_AMBULATORY_CARE_PROVIDER_SITE_OTHER): Payer: Medicare Other | Admitting: Women's Health

## 2014-04-02 ENCOUNTER — Telehealth: Payer: Self-pay | Admitting: *Deleted

## 2014-04-02 VITALS — BP 146/80 | Ht 64.0 in | Wt 150.0 lb

## 2014-04-02 DIAGNOSIS — N644 Mastodynia: Secondary | ICD-10-CM

## 2014-04-02 DIAGNOSIS — C50511 Malignant neoplasm of lower-outer quadrant of right female breast: Secondary | ICD-10-CM | POA: Insufficient documentation

## 2014-04-02 NOTE — Telephone Encounter (Signed)
Appointment on 04/04/14 @ 8:30 am, left detailed message on pt voicemail

## 2014-04-02 NOTE — Patient Instructions (Signed)

## 2014-04-02 NOTE — Telephone Encounter (Signed)
Message copied by Thamas Jaegers on Tue Apr 02, 2014  2:31 PM ------      Message from: [place], [place] J      Created: Tue Apr 02, 2014 12:55 PM       Please schedule rt breast diagnostic, breast pain, hx of rt breast cancer in 1997. Any time ok, ok tomarrow if after 4:30. ------

## 2014-04-02 NOTE — Progress Notes (Signed)
Patient ID: Kathryn Wall, female   DOB: [birth year], 68 y.o.   MRN: [id number] Presents with complaint of right breast tenderness, outer aspect extending to the axilla for one week. 1997 right breast cancer with lumpectomy and radiation, last mammogram 08/2013 normal. Denies change in breast exam and no nipple discharge. Questions if possible bruising  from bra nserts.  Exam: Appears well, but worried. Husband accompanied. Breast exam  sitting and lying position without palpable nodules, erythema, dimpling or nipple discharge. Right breast outer aspect well-healed incision with scar tissue palpable at area of  tenderness. Right breast much smaller than left breast states has been that way since surgery.  New onset right breast mastodynia History right breast cancer 1997/lumpectomy and radiation  Plan: Right breast diagnostic mammogram, we'll get scheduled. Reassured normal breast exam. Avoid bra inserts.

## 2014-04-08 ENCOUNTER — Encounter: Payer: Self-pay | Admitting: Women's Health

## 2014-04-29 ENCOUNTER — Encounter: Payer: Self-pay | Admitting: Women's Health

## 2014-05-06 ENCOUNTER — Other Ambulatory Visit: Payer: Self-pay | Admitting: Family Medicine

## 2014-05-15 ENCOUNTER — Telehealth: Payer: Self-pay | Admitting: Family Medicine

## 2014-05-15 MED ORDER — HYDROCODONE-ACETAMINOPHEN 5-325 MG PO TABS: 1.0000 | ORAL_TABLET | Freq: Four times a day (QID) | ORAL | Status: DC | PRN

## 2014-05-15 NOTE — Telephone Encounter (Signed)
Pt states that Dr. Mina Marble is out of town and he usually will give her something for the pain. She picked up a clothes basket a few days ago and now she is having back pain. She does have an appt the end of the month, if we could give her something just until then.  Per Dr. Buelah Manis ok - Rx printed, signed and pt aware to pick up.

## 2014-05-15 NOTE — Telephone Encounter (Signed)
Patient would like to know if dr pickard can give her a rx for pain medication for her back pain if possible  [phone]

## 2014-05-16 ENCOUNTER — Other Ambulatory Visit: Payer: Self-pay | Admitting: Family Medicine

## 2014-06-12 ENCOUNTER — Other Ambulatory Visit: Payer: Self-pay | Admitting: Women's Health

## 2014-06-18 ENCOUNTER — Other Ambulatory Visit: Payer: Self-pay | Admitting: Family Medicine

## 2014-06-18 ENCOUNTER — Encounter: Payer: Self-pay | Admitting: Women's Health

## 2014-06-18 NOTE — Telephone Encounter (Signed)
Medication refilled per protocol. 

## 2014-07-24 ENCOUNTER — Other Ambulatory Visit: Payer: Self-pay | Admitting: Family Medicine

## 2014-07-24 ENCOUNTER — Encounter: Payer: Self-pay | Admitting: Family Medicine

## 2014-07-24 NOTE — Telephone Encounter (Signed)
Medication refill for one time only.  Patient needs to be seen.  Letter sent for patient to call and schedule 

## 2014-08-08 ENCOUNTER — Ambulatory Visit (INDEPENDENT_AMBULATORY_CARE_PROVIDER_SITE_OTHER): Payer: 59 | Admitting: Family Medicine

## 2014-08-08 ENCOUNTER — Encounter: Payer: Self-pay | Admitting: Family Medicine

## 2014-08-08 VITALS — BP 138/78 | HR 74 | Temp 98.0°F | Resp 18 | Ht 65.0 in | Wt 178.0 lb

## 2014-08-08 DIAGNOSIS — E785 Hyperlipidemia, unspecified: Secondary | ICD-10-CM

## 2014-08-08 DIAGNOSIS — E119 Type 2 diabetes mellitus without complications: Secondary | ICD-10-CM

## 2014-08-08 DIAGNOSIS — I10 Essential (primary) hypertension: Secondary | ICD-10-CM

## 2014-08-08 NOTE — Progress Notes (Signed)
Subjective:    Patient ID: Kathryn Wall, female    DOB: May 25, 1946, 69 y.o.   MRN: [id number]  HPI Patient is a very sweet 69 year old white female who is here today for follow-up. Past medical history is significant for type 2 diabetes mellitus, stage III chronic kidney disease, hypertension, and hyperlipidemia. Her blood sugars are doing exceptionally well. Fasting blood sugars are ranging 100-120. She denies any hypoglycemia. Unfortunately since starting the Actos the patient has gained a substantial amount of weight. She is also complaining of problems with fluid retention. Diabetic foot exam is performed today and is normal. She denies any chest pain shortness of breath or dyspnea on exertion. She denies any myalgias or right upper quadrant pain. Her chronic abdominal pain has resolved. Overall patient is doing exceptionally well. Past Medical History  Diagnosis Date  . Hypertension   . High cholesterol   . Diabetes mellitus     TYPE II  . Breast cancer 1997    AGE 14, BRCA 1 NEGATIVE 2. 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 CKD (chronic kidney disease), stage III    Past Surgical History  Procedure Laterality Date  . Cholecystectomy  1980  . Breast surgery  1997    RIGHT BREAST LUMPECTOMY  . Abdominal hysterectomy  1985    TAH   Current Outpatient Prescriptions on File Prior to Visit  Medication Sig Dispense Refill  . ALPRAZolam (XANAX) 0.5 MG tablet Take 0.5 mg by mouth at bedtime as needed.      Marland Kitchen amLODipine (NORVASC) 5 MG tablet TAKE 1 TABLET BY MOUTH ONCE A DAY 30 tablet 1  . atorvastatin (LIPITOR) 40 MG tablet TAKE 1 TABLET BY MOUTH DAILY AT BEDTIME 90 tablet 3  . buPROPion (WELLBUTRIN XL) 150 MG 24 hr tablet Take 150 mg by mouth daily.      Marland Kitchen HYDROcodone-acetaminophen (NORCO) 5-325 MG per tablet Take 1 tablet by mouth every 6 (six) hours as needed for moderate pain. 40 tablet 0  . JANUMET 50-1000 MG per tablet TAKE 1 TABLET BY MOUTH TWICE A DAY WITH  A MEAL 60 tablet 3  . Linaclotide (LINZESS) 290 MCG CAPS capsule Take 1 capsule (290 mcg total) by mouth daily. 30 capsule 2  . Multiple Vitamins-Minerals (MULTIVITAMINS THER. W/MINERALS) TABS Take 1 tablet by mouth daily.      . pantoprazole (PROTONIX) 40 MG tablet TAKE 1 TABLET BY MOUTH EVERY MORNING 30 tablet 11  . pioglitazone (ACTOS) 30 MG tablet TAKE ONE TABLET BY MOUTH EVERY DAY 30 tablet 0  . valsartan (DIOVAN) 160 MG tablet TAKE 1 TABLET BY MOUTH ONCE A DAY 30 tablet 11  . zolpidem (AMBIEN) 10 MG tablet Take 10 mg by mouth at bedtime.       No current facility-administered medications on file prior to visit.   Allergies  Allergen Reactions  . Allergen [Antipyrine-Benzocaine]   . Cymbalta [Duloxetine Hcl]   . Other     SENSITIVE TO ANTIBIOTICS   History   Social History  . Marital Status: Married    Spouse Name: N/A  . Number of Children: N/A  . Years of Education: N/A   Occupational History  . Not on file.   Social History Main Topics  . Smoking status: Never Smoker   . Smokeless tobacco: Never Used  . Alcohol Use: No  . Drug Use: No  . Sexual Activity: Yes    Birth Control/ Protection: Post-menopausal, Surgical  Comment: HYST   Other Topics Concern  . Not on file   Social History Narrative      Review of Systems  All other systems reviewed and are negative.      Objective:   Physical Exam  Neck: No JVD present. No thyromegaly present.  Cardiovascular: Normal rate, regular rhythm, normal heart sounds and intact distal pulses.   No murmur heard. Pulmonary/Chest: Effort normal and breath sounds normal. No respiratory distress. She has no wheezes. She has no rales.  Abdominal: Soft. Bowel sounds are normal. She exhibits no distension. There is no tenderness. There is no rebound and no guarding.  Musculoskeletal: She exhibits no edema.  Vitals reviewed.         Assessment & Plan:  Diabetes mellitus type II, controlled - Plan: COMPLETE  METABOLIC PANEL WITH GFR, CBC with Differential/Platelet, Lipid panel, Hemoglobin A1c, Microalbumin, urine  Essential hypertension  HLD (hyperlipidemia)  Patient's blood pressure is excellent. I will check a fasting lipid panel. Goal LDL cholesterol is less than 100. I will also check a hemoglobin A1c with a goal hemoglobin A1c less than 6.5 as well as a urine microalbumin. If A1c is at goal, I will stop the patient's Actos.

## 2014-08-09 LAB — CBC WITH DIFFERENTIAL/PLATELET
BASOS ABS: 0.1 10*3/uL (ref 0.0–0.1)
BASOS PCT: 1 % (ref 0–1)
Eosinophils Absolute: 0.4 10*3/uL (ref 0.0–0.7)
Eosinophils Relative: 6 % — ABNORMAL HIGH (ref 0–5)
HCT: 36.2 % (ref 36.0–46.0)
HEMOGLOBIN: 11.7 g/dL — AB (ref 12.0–15.0)
Lymphocytes Relative: 29 % (ref 12–46)
Lymphs Abs: 1.7 10*3/uL (ref 0.7–4.0)
MCH: 30.2 pg (ref 26.0–34.0)
MCHC: 32.3 g/dL (ref 30.0–36.0)
MCV: 93.5 fL (ref 78.0–100.0)
MONOS PCT: 8 % (ref 3–12)
MPV: 10.9 fL (ref 8.6–12.4)
Monocytes Absolute: 0.5 10*3/uL (ref 0.1–1.0)
NEUTROS PCT: 56 % (ref 43–77)
Neutro Abs: 3.4 10*3/uL (ref 1.7–7.7)
Platelets: 335 10*3/uL (ref 150–400)
RBC: 3.87 MIL/uL (ref 3.87–5.11)
RDW: 14.2 % (ref 11.5–15.5)
WBC: 6 10*3/uL (ref 4.0–10.5)

## 2014-08-09 LAB — COMPLETE METABOLIC PANEL WITH GFR
ALBUMIN: 4.1 g/dL (ref 3.5–5.2)
ALK PHOS: 84 U/L (ref 39–117)
ALT: 17 U/L (ref 0–35)
AST: 20 U/L (ref 0–37)
BUN: 23 mg/dL (ref 6–23)
CALCIUM: 9.5 mg/dL (ref 8.4–10.5)
CHLORIDE: 105 meq/L (ref 96–112)
CO2: 23 mEq/L (ref 19–32)
Creat: 1.41 mg/dL — ABNORMAL HIGH (ref 0.50–1.10)
GFR, Est African American: 44 mL/min — ABNORMAL LOW
GFR, Est Non African American: 38 mL/min — ABNORMAL LOW
Glucose, Bld: 84 mg/dL (ref 70–99)
Potassium: 5.1 mEq/L (ref 3.5–5.3)
Sodium: 140 mEq/L (ref 135–145)
TOTAL PROTEIN: 6.8 g/dL (ref 6.0–8.3)
Total Bilirubin: 0.4 mg/dL (ref 0.2–1.2)

## 2014-08-09 LAB — HEMOGLOBIN A1C
Hgb A1c MFr Bld: 6.3 % — ABNORMAL HIGH (ref <5.7)
Mean Plasma Glucose: 134 mg/dL — ABNORMAL HIGH (ref <117)

## 2014-08-09 LAB — LIPID PANEL
CHOL/HDL RATIO: 3.5 ratio
CHOLESTEROL: 184 mg/dL (ref 0–200)
HDL: 52 mg/dL (ref >39)
LDL Cholesterol: 78 mg/dL (ref 0–99)
TRIGLYCERIDES: 272 mg/dL — AB (ref <150)
VLDL: 54 mg/dL — ABNORMAL HIGH (ref 0–40)

## 2014-08-16 ENCOUNTER — Encounter: Payer: Self-pay | Admitting: *Deleted

## 2014-08-20 ENCOUNTER — Other Ambulatory Visit: Payer: Self-pay | Admitting: Family Medicine

## 2014-08-21 ENCOUNTER — Telehealth: Payer: Self-pay | Admitting: *Deleted

## 2014-08-21 MED ORDER — HYDROCORTISONE ACETATE 25 MG RE SUPP: 25.0000 mg | Freq: Two times a day (BID) | RECTAL | Status: DC

## 2014-08-21 NOTE — Telephone Encounter (Signed)
Pt calling stating she is having some hemorroids and would like something called in for her, has tried using OTC preparation H and is not working for her. I informed pt Dr. Dennard Schaumann not in today and wanted to see if another provider can call it in. Please advise!  [place]

## 2014-08-21 NOTE — Telephone Encounter (Signed)
suppostiories sent into pharmacy

## 2014-08-21 NOTE — Telephone Encounter (Signed)
Okay to send in Anusol suppository BID for 1 week

## 2014-09-05 ENCOUNTER — Other Ambulatory Visit: Payer: 59

## 2014-09-06 LAB — MICROALBUMIN, URINE: Microalb, Ur: 0.6 mg/dL (ref <2.0)

## 2014-09-10 ENCOUNTER — Encounter: Payer: Self-pay | Admitting: Family Medicine

## 2014-09-27 ENCOUNTER — Other Ambulatory Visit: Payer: Self-pay | Admitting: Family Medicine

## 2014-10-16 ENCOUNTER — Ambulatory Visit (INDEPENDENT_AMBULATORY_CARE_PROVIDER_SITE_OTHER): Payer: Medicare Other | Admitting: Physician Assistant

## 2014-10-16 ENCOUNTER — Encounter: Payer: Self-pay | Admitting: Physician Assistant

## 2014-10-16 VITALS — BP 144/88 | HR 100 | Temp 97.9°F | Resp 20 | Wt 193.0 lb

## 2014-10-16 DIAGNOSIS — R531 Weakness: Secondary | ICD-10-CM

## 2014-10-16 DIAGNOSIS — R0602 Shortness of breath: Secondary | ICD-10-CM | POA: Diagnosis not present

## 2014-10-16 DIAGNOSIS — R635 Abnormal weight gain: Secondary | ICD-10-CM

## 2014-10-16 NOTE — Progress Notes (Signed)
Patient ID: Kathryn Wall MRN: [id number], DOB: [birth year], 69 y.o. Date of Encounter: _0 @  Chief Complaint:  Chief Complaint  Patient presents with  . sick x 3 days    nausea, diarrhea, weak, swelling in upper legs    HPI: 69 y.o. year old white female  presents with her husband.   I reviewed the above chief complaint with patient. She states that "No,  she hasn't been sick for just 3 days." She says that in regards to the nausea and diarrhea and weakness--all of these symptoms have been going on-- off and on--- for a very long time.  Says that since February she and her husband seem to keep passing symptoms back and forth, off and on-- in regards to nausea vomiting diarrhea. She also says that last year she had endoscopy colonoscopy and other GI evaluation, all of which was negative.  She also says that she has had significant weakness for months. Says that she just has not felt well for a long time.  Says that she feels like she is wheezing at night and feels short of breath. Says that she sleeps on one pillow at night. Has no orthopnea and no PND.  Also is concerned because her weight is up significantly today. Noted that her weight last visit was 170s and today is up to 193. She feels that this must be secondary to fluid retention or some problem because she knows her diet and exercise have not been that different over the last several months to explain this weight gain.  I reviewed Dr. Samella Parr LOV note, dated 08/08/2014, which includes":Unfortunately since starting the Actos the patient has gained a substantial amount of weight. She is also complaining of problems with fluid retention. "  Past Medical History  Diagnosis Date  . Hypertension   . High cholesterol   . Diabetes mellitus     TYPE II  . Breast cancer 1997    AGE 69, BRCA 1 NEGATIVE 2. 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 CKD (chronic kidney disease), stage III      Home  Meds: Outpatient Prescriptions Prior to Visit  Medication Sig Dispense Refill  . ALPRAZolam (XANAX) 0.5 MG tablet Take 0.5 mg by mouth at bedtime as needed.      Marland Kitchen amLODipine (NORVASC) 5 MG tablet TAKE 1 TABLET BY MOUTH ONCE A DAY 30 tablet 11  . atorvastatin (LIPITOR) 40 MG tablet TAKE 1 TABLET BY MOUTH DAILY AT BEDTIME 90 tablet 3  . HYDROcodone-acetaminophen (NORCO) 5-325 MG per tablet Take 1 tablet by mouth every 6 (six) hours as needed for moderate pain. 40 tablet 0  . hydrocortisone (ANUSOL-HC) 25 MG suppository Place 1 suppository (25 mg total) rectally 2 (two) times daily. 12 suppository 0  . Linaclotide (LINZESS) 290 MCG CAPS capsule Take 1 capsule (290 mcg total) by mouth daily. 30 capsule 2  . Multiple Vitamins-Minerals (MULTIVITAMINS THER. W/MINERALS) TABS Take 1 tablet by mouth daily.      . pantoprazole (PROTONIX) 40 MG tablet TAKE 1 TABLET BY MOUTH EVERY MORNING 30 tablet 11  . pioglitazone (ACTOS) 30 MG tablet TAKE 1 TABLET BY MOUTH ONCE A DAY 30 tablet 5  . valsartan (DIOVAN) 160 MG tablet TAKE 1 TABLET BY MOUTH ONCE A DAY 30 tablet 11  . zolpidem (AMBIEN) 10 MG tablet Take 10 mg by mouth at bedtime.      Marland Kitchen buPROPion (WELLBUTRIN XL) 150 MG 24 hr tablet Take 150  mg by mouth daily.      Marland Kitchen JANUMET 50-1000 MG per tablet TAKE 1 TABLET BY MOUTH TWICE A DAY WITH A MEAL (Patient not taking: Reported on 10/16/2014) 60 tablet 3   No facility-administered medications prior to visit.    Allergies:  Allergies  Allergen Reactions  . Allergen [Antipyrine-Benzocaine]   . Cymbalta [Duloxetine Hcl]   . Other     SENSITIVE TO ANTIBIOTICS    History   Social History  . Marital Status: Married    Spouse Name: N/A  . Number of Children: N/A  . Years of Education: N/A   Occupational History  . Not on file.   Social History Main Topics  . Smoking status: Never Smoker   . Smokeless tobacco: Never Used  . Alcohol Use: No  . Drug Use: No  . Sexual Activity: Yes    Birth Control/  Protection: Post-menopausal, Surgical     Comment: HYST   Other Topics Concern  . Not on file   Social History Narrative    Family History  Problem Relation Age of Onset  . Cancer Mother     COLON  . Hypertension Father   . Heart disease Father      Review of Systems:  See HPI for pertinent ROS. All other ROS negative.    Physical Exam: Blood pressure 144/88, pulse 100, temperature 97.9 F (36.6 C), temperature source Oral, resp. rate 20, weight 193 lb (87.544 kg), SpO2 97 %., Body mass index is 32.12 kg/(m^2). General: Obese WF. Appears in no acute distress. Neck: Supple. No thyromegaly. No lymphadenopathy. Lungs: Clear bilaterally to auscultation without wheezes, rales, or rhonchi. Breathing is unlabored. Heart: RRR with S1 S2. No murmurs, rubs, or gallops. Abdomen: Abdomen is large and protuberant. However, it is Soft, non-tender, non-distended with normoactive bowel sounds. No hepatomegaly. No rebound/guarding. No obvious abdominal masses. Musculoskeletal:  Strength and tone normal for age. Extremities/Skin: Warm and dry.  No LE edema. She has very small, bony ankles and feet and lower legs.  Neuro: Alert and oriented X 3. Moves all extremities spontaneously. Gait is normal. CNII-XII grossly in tact. Psych:  Responds to questions appropriately with a normal affect.     ASSESSMENT AND PLAN:  69 y.o. year old female with  1. Weakness generalized - CBC with Differential/Platelet - COMPLETE METABOLIC PANEL WITH GFR - TSH  2. Weight gain 08/08/14 office visit weight 178 Today 10/16/14 weight 193  - COMPLETE METABOLIC PANEL WITH GFR - TSH - Brain natriuretic peptide  3. SOB (shortness of breath) - CBC with Differential/Platelet - COMPLETE METABOLIC PANEL WITH GFR - Brain natriuretic peptide   I reviewed Dr. Samella Parr LOV note, dated 08/08/2014, which includes":Unfortunately since starting the Actos the patient has gained a substantial amount of weight. She is also  complaining of problems with fluid retention. " Actos is still on her medication list. I don't know if it is possible this is contributing to her symptoms.  I also reviewed that prior to regular office visit 08/08/2014 , her last prior OV with Dr. Dennard Schaumann was 10/08/2013. In Reviewing her chart, I do not see that she has had any office visits here regarding the symptoms which she is reporting to have been present for many months. Also, do not see any GI notes or any other referance  to GI problems. Need to follow-up with patient to verify whether she was seeing [facility] or some other group that is not in epic. We'll obtain above labs  and then determine further evaluation once I get these results.  Marin Olp [place], [place], [facility] 10/16/2014 6:49 PM

## 2014-10-17 LAB — BRAIN NATRIURETIC PEPTIDE: Brain Natriuretic Peptide: 8.2 pg/mL (ref 0.0–100.0)

## 2014-10-17 LAB — CBC WITH DIFFERENTIAL/PLATELET
BASOS ABS: 0.1 10*3/uL (ref 0.0–0.1)
Basophils Relative: 1 % (ref 0–1)
Eosinophils Absolute: 0.4 10*3/uL (ref 0.0–0.7)
Eosinophils Relative: 5 % (ref 0–5)
HEMATOCRIT: 37.4 % (ref 36.0–46.0)
HEMOGLOBIN: 12 g/dL (ref 12.0–15.0)
Lymphocytes Relative: 31 % (ref 12–46)
Lymphs Abs: 2.5 10*3/uL (ref 0.7–4.0)
MCH: 29.7 pg (ref 26.0–34.0)
MCHC: 32.1 g/dL (ref 30.0–36.0)
MCV: 92.6 fL (ref 78.0–100.0)
MONOS PCT: 7 % (ref 3–12)
MPV: 11.5 fL (ref 8.6–12.4)
Monocytes Absolute: 0.6 10*3/uL (ref 0.1–1.0)
NEUTROS ABS: 4.5 10*3/uL (ref 1.7–7.7)
NEUTROS PCT: 56 % (ref 43–77)
PLATELETS: 372 10*3/uL (ref 150–400)
RBC: 4.04 MIL/uL (ref 3.87–5.11)
RDW: 15 % (ref 11.5–15.5)
WBC: 8.1 10*3/uL (ref 4.0–10.5)

## 2014-10-17 LAB — COMPLETE METABOLIC PANEL WITH GFR
ALK PHOS: 91 U/L (ref 39–117)
ALT: 23 U/L (ref 0–35)
AST: 27 U/L (ref 0–37)
Albumin: 4.1 g/dL (ref 3.5–5.2)
BUN: 20 mg/dL (ref 6–23)
CALCIUM: 9.6 mg/dL (ref 8.4–10.5)
CHLORIDE: 102 meq/L (ref 96–112)
CO2: 26 mEq/L (ref 19–32)
Creat: 1.2 mg/dL — ABNORMAL HIGH (ref 0.50–1.10)
GFR, EST NON AFRICAN AMERICAN: 46 mL/min — AB
GFR, Est African American: 53 mL/min — ABNORMAL LOW
Glucose, Bld: 124 mg/dL — ABNORMAL HIGH (ref 70–99)
POTASSIUM: 5.1 meq/L (ref 3.5–5.3)
SODIUM: 137 meq/L (ref 135–145)
Total Bilirubin: 0.3 mg/dL (ref 0.2–1.2)
Total Protein: 6.8 g/dL (ref 6.0–8.3)

## 2014-10-17 LAB — TSH: TSH: 3.352 u[IU]/mL (ref 0.350–4.500)

## 2014-10-18 ENCOUNTER — Telehealth: Payer: Self-pay | Admitting: Family Medicine

## 2014-10-18 NOTE — Telephone Encounter (Signed)
Notes Recorded by Orlena Sheldon, PA-C on 10/17/2014 at 1:02 PM Tell pt labs are normal. No anemia. Thyroid normal. Because she c/o weight gain, swelling, and SOB, I did lab to check for CHF--even this was normal.  Tell her I did review her LOV note and labs with Dr. Dennard Schaumann.  08/08/2014--OV note and Lab Result Note state to stop Actos but this is still on me list so not sure if still taking this.  Recommend stopping Actos.   Patient aware of results

## 2014-10-31 ENCOUNTER — Telehealth: Payer: Self-pay | Admitting: Family Medicine

## 2014-10-31 ENCOUNTER — Encounter: Payer: Self-pay | Admitting: Family Medicine

## 2014-10-31 NOTE — Telephone Encounter (Signed)
This encounter was created in error - please disregard.

## 2014-10-31 NOTE — Telephone Encounter (Signed)
-----   Message from Orlena Sheldon, PA-C sent at 10/30/2014  7:23 AM EDT ----- First--- if she has not stopped Actos, then stop Actos and see if fluid retention improves. Second--IF she HAS ALREADY stopped Actos, then now would stop Norvasc as well. Schedule follow-up office visit.

## 2014-10-31 NOTE — Telephone Encounter (Signed)
She has stopped Actos, so now told to stop Amlodipine.  Told to make appt with provider.  She said will check schedule and call back to do that.

## 2014-10-31 NOTE — Telephone Encounter (Signed)
Opened in error

## 2015-01-13 ENCOUNTER — Ambulatory Visit (INDEPENDENT_AMBULATORY_CARE_PROVIDER_SITE_OTHER): Payer: Medicare Other | Admitting: Family Medicine

## 2015-01-13 ENCOUNTER — Telehealth: Payer: Self-pay | Admitting: Family Medicine

## 2015-01-13 ENCOUNTER — Encounter: Payer: Self-pay | Admitting: Family Medicine

## 2015-01-13 VITALS — BP 146/90 | HR 88 | Temp 97.4°F | Resp 22 | Ht 64.0 in | Wt 193.0 lb

## 2015-01-13 DIAGNOSIS — R350 Frequency of micturition: Secondary | ICD-10-CM

## 2015-01-13 DIAGNOSIS — R0609 Other forms of dyspnea: Secondary | ICD-10-CM | POA: Diagnosis not present

## 2015-01-13 DIAGNOSIS — R635 Abnormal weight gain: Secondary | ICD-10-CM

## 2015-01-13 DIAGNOSIS — R5383 Other fatigue: Secondary | ICD-10-CM

## 2015-01-13 LAB — URINALYSIS, ROUTINE W REFLEX MICROSCOPIC
Bilirubin Urine: NEGATIVE
Glucose, UA: NEGATIVE mg/dL
Ketones, ur: NEGATIVE mg/dL
Nitrite: NEGATIVE
PH: 5.5 (ref 5.0–8.0)
Specific Gravity, Urine: 1.02 (ref 1.005–1.030)
Urobilinogen, UA: 0.2 mg/dL (ref 0.0–1.0)

## 2015-01-13 LAB — COMPLETE METABOLIC PANEL WITH GFR
ALK PHOS: 86 U/L (ref 39–117)
ALT: 21 U/L (ref 0–35)
AST: 26 U/L (ref 0–37)
Albumin: 3.9 g/dL (ref 3.5–5.2)
BILIRUBIN TOTAL: 0.4 mg/dL (ref 0.2–1.2)
BUN: 24 mg/dL — ABNORMAL HIGH (ref 6–23)
CO2: 22 mEq/L (ref 19–32)
CREATININE: 1.36 mg/dL — AB (ref 0.50–1.10)
Calcium: 9.7 mg/dL (ref 8.4–10.5)
Chloride: 102 mEq/L (ref 96–112)
GFR, Est African American: 45 mL/min — ABNORMAL LOW
GFR, Est Non African American: 39 mL/min — ABNORMAL LOW
Glucose, Bld: 119 mg/dL — ABNORMAL HIGH (ref 70–99)
Potassium: 5 mEq/L (ref 3.5–5.3)
Sodium: 139 mEq/L (ref 135–145)
Total Protein: 6.9 g/dL (ref 6.0–8.3)

## 2015-01-13 LAB — URINALYSIS, MICROSCOPIC ONLY
CASTS: NONE SEEN
Crystals: NONE SEEN

## 2015-01-13 LAB — VITAMIN B12: Vitamin B-12: 837 pg/mL (ref 211–911)

## 2015-01-13 LAB — TSH: TSH: 2.975 u[IU]/mL (ref 0.350–4.500)

## 2015-01-13 MED ORDER — CLOTRIMAZOLE-BETAMETHASONE 1-0.05 % EX CREA: 1.0000 "application " | TOPICAL_CREAM | Freq: Two times a day (BID) | CUTANEOUS | Status: DC

## 2015-01-13 NOTE — Progress Notes (Signed)
Subjective:    Patient ID: Kathryn Wall, female    DOB: February 18, 1946, 69 y.o.   MRN: [id number]  HPI  Please see my office visit in February. At that time the patient had a hemoglobin A1c of 6.3. Due to her weight gain I recommended that she discontinue Actos.. Patient was confused and continue the Actos but discontinued Janumet instead. Around the same time she was started on Remeron for insomnia by her psychiatrist. She is now concerned because she has gained approximately 15 pounds since that office visit. This is causing dyspnea on exertion. She denies any angina. She denies any chest pain. She denies any orthopnea. On examination today lungs are clear to auscultation bilaterally. There is no JVD. She has no peripheral edema. Urinalysis shows no significant proteinuria consistent with a nephrotic syndrome. TSH was just checked in April and was normal. Past Medical History  Diagnosis Date  . Hypertension   . High cholesterol   . Diabetes mellitus     TYPE II  . Breast cancer 1997    AGE 13, BRCA 1 NEGATIVE 2. 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 CKD (chronic kidney disease), stage III    Past Surgical History  Procedure Laterality Date  . Cholecystectomy  1980  . Breast surgery  1997    RIGHT BREAST LUMPECTOMY  . Abdominal hysterectomy  1985    TAH   Current Outpatient Prescriptions on File Prior to Visit  Medication Sig Dispense Refill  . atorvastatin (LIPITOR) 40 MG tablet TAKE 1 TABLET BY MOUTH DAILY AT BEDTIME 90 tablet 3  . buPROPion (WELLBUTRIN XL) 150 MG 24 hr tablet Take 150 mg by mouth daily.      . Multiple Vitamins-Minerals (MULTIVITAMINS THER. W/MINERALS) TABS Take 1 tablet by mouth daily.      . pantoprazole (PROTONIX) 40 MG tablet TAKE 1 TABLET BY MOUTH EVERY MORNING 30 tablet 11  . pioglitazone (ACTOS) 30 MG tablet TAKE 1 TABLET BY MOUTH ONCE A DAY 30 tablet 5  . valsartan (DIOVAN) 160 MG tablet TAKE 1 TABLET BY MOUTH ONCE A DAY 30 tablet 11    . zolpidem (AMBIEN) 10 MG tablet Take 10 mg by mouth at bedtime.       No current facility-administered medications on file prior to visit.   Allergies  Allergen Reactions  . Allergen [Antipyrine-Benzocaine]   . Cymbalta [Duloxetine Hcl]   . Other     SENSITIVE TO ANTIBIOTICS   History   Social History  . Marital Status: Married    Spouse Name: N/A  . Number of Children: N/A  . Years of Education: N/A   Occupational History  . Not on file.   Social History Main Topics  . Smoking status: Never Smoker   . Smokeless tobacco: Never Used  . Alcohol Use: No  . Drug Use: No  . Sexual Activity: Yes    Birth Control/ Protection: Post-menopausal, Surgical     Comment: HYST   Other Topics Concern  . Not on file   Social History Narrative     Review of Systems  All other systems reviewed and are negative.      Objective:   Physical Exam  Constitutional: She appears well-developed and well-nourished.  Neck: Neck supple. No JVD present. No thyromegaly present.  Cardiovascular: Normal rate, regular rhythm and normal heart sounds.   Pulmonary/Chest: Effort normal and breath sounds normal. No respiratory distress. She has no wheezes. She has no rales.  Abdominal: Soft. Bowel sounds are normal.  Musculoskeletal: She exhibits no edema.  Vitals reviewed.         Assessment & Plan:  Frequent urination - Plan: Urinalysis, Routine w reflex microscopic (not at [facility])  Other fatigue - Plan: COMPLETE METABOLIC PANEL WITH GFR, TSH, Vitamin B12  Weight gain  Dyspnea on exertion  I suspect the patient's dyspnea is due to her significant weight gain and deconditioning. I recommended the patient discontinue Actos. I recommended she resume Januvia/metformin 50/1000 one by mouth twice a day. Also recommended she contact her psychiatrist about possibly stopping Remeron. Recheck the patient's weight in 6 weeks. I see no evidence of congestive heart failure today. She has no other  symptoms concerning for ischemic cardiac disease. His symptoms are not better and she loses weight, the next step would be to undertake cardiac workup as well as a chest x-ray

## 2015-01-13 NOTE — Telephone Encounter (Signed)
Medication called/sent to requested pharmacy  

## 2015-01-13 NOTE — Telephone Encounter (Signed)
Patient forgot to ask dr pickard if he could prescribe a cream for irritation under her belly and the insides of her legs  [place] [phone] if any questions

## 2015-01-13 NOTE — Telephone Encounter (Signed)
lotrisone bid for 2 weeks.

## 2015-01-27 ENCOUNTER — Other Ambulatory Visit: Payer: Self-pay | Admitting: Family Medicine

## 2015-02-10 ENCOUNTER — Encounter: Payer: Self-pay | Admitting: Family Medicine

## 2015-02-10 ENCOUNTER — Ambulatory Visit (INDEPENDENT_AMBULATORY_CARE_PROVIDER_SITE_OTHER): Payer: Medicare Other | Admitting: Family Medicine

## 2015-02-10 VITALS — BP 156/110 | HR 88 | Temp 98.3°F | Resp 18 | Wt 187.0 lb

## 2015-02-10 DIAGNOSIS — E119 Type 2 diabetes mellitus without complications: Secondary | ICD-10-CM | POA: Diagnosis not present

## 2015-02-10 NOTE — Progress Notes (Signed)
Subjective:    Patient ID: Kathryn Wall, female    DOB: 28-Feb-1946, 69 y.o.   MRN: [id number]  HPI  01/13/15 Please see my office visit in February. At that time the patient had a hemoglobin A1c of 6.3. Due to her weight gain I recommended that she discontinue Actos.. Patient was confused and continue the Actos but discontinued Janumet instead. Around the same time she was started on Remeron for insomnia by her psychiatrist. She is now concerned because she has gained approximately 15 pounds since that office visit. This is causing dyspnea on exertion. She denies any angina. She denies any chest pain. She denies any orthopnea. On examination today lungs are clear to auscultation bilaterally. There is no JVD. She has no peripheral edema. Urinalysis shows no significant proteinuria consistent with a nephrotic syndrome. TSH was just checked in April and was normal.  At that time, my plan was: I suspect the patient's dyspnea is due to her significant weight gain and deconditioning. I recommended the patient discontinue Actos. I recommended she resume Januvia/metformin 50/1000 one by mouth twice a day. Also recommended she contact her psychiatrist about possibly stopping Remeron. Recheck the patient's weight in 6 weeks. I see no evidence of congestive heart failure today. She has no other symptoms concerning for ischemic cardiac disease. His symptoms are not better and she loses weight, the next step would be to undertake cardiac workup as well as a chest x-ray   02/10/15 Here today for follow up.  She has lost 6 pounds since her last office visit. Her breathing is much improved. She denies any shortness of breath, chest pain, pleurisy, dyspnea on exertion, or orthopnea. Her blood pressure is elevated today but she is extremely nervous. At home she states her blood pressure is much better controlled. She is due for hemoglobin A1c Past Medical History  Diagnosis Date  . Hypertension   . High cholesterol    . Diabetes mellitus     TYPE II  . Breast cancer 1997    AGE 68, BRCA 1 NEGATIVE 2. 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 CKD (chronic kidney disease), stage III    Past Surgical History  Procedure Laterality Date  . Cholecystectomy  1980  . Breast surgery  1997    RIGHT BREAST LUMPECTOMY  . Abdominal hysterectomy  1985    TAH   Current Outpatient Prescriptions on File Prior to Visit  Medication Sig Dispense Refill  . atorvastatin (LIPITOR) 40 MG tablet TAKE 1 TABLET BY MOUTH DAILY AT BEDTIME 90 tablet 3  . buPROPion (WELLBUTRIN XL) 150 MG 24 hr tablet Take 150 mg by mouth daily.      . clotrimazole-betamethasone (LOTRISONE) cream Apply 1 application topically 2 (two) times daily. X 2 weeks 45 g 0  . mirtazapine (REMERON) 15 MG tablet Take 15 mg by mouth at bedtime.    . Multiple Vitamins-Minerals (MULTIVITAMINS THER. W/MINERALS) TABS Take 1 tablet by mouth daily.      . pantoprazole (PROTONIX) 40 MG tablet TAKE 1 TABLET BY MOUTH EVERY MORNING 30 tablet 11  . pioglitazone (ACTOS) 30 MG tablet TAKE 1 TABLET BY MOUTH ONCE A DAY 30 tablet 5  . valsartan (DIOVAN) 160 MG tablet TAKE 1 TABLET BY MOUTH ONCE A DAY 30 tablet 11  . zolpidem (AMBIEN) 10 MG tablet Take 10 mg by mouth at bedtime.       No current facility-administered medications on file prior to visit.  Allergies  Allergen Reactions  . Allergen [Antipyrine-Benzocaine]   . Cymbalta [Duloxetine Hcl]   . Other     SENSITIVE TO ANTIBIOTICS   Social History   Social History  . Marital Status: Married    Spouse Name: N/A  . Number of Children: N/A  . Years of Education: N/A   Occupational History  . Not on file.   Social History Main Topics  . Smoking status: Never Smoker   . Smokeless tobacco: Never Used  . Alcohol Use: No  . Drug Use: No  . Sexual Activity: Yes    Birth Control/ Protection: Post-menopausal, Surgical     Comment: HYST   Other Topics Concern  . Not on file   Social  History Narrative     Review of Systems  All other systems reviewed and are negative.      Objective:   Physical Exam  Constitutional: She appears well-developed and well-nourished.  Neck: Neck supple. No JVD present. No thyromegaly present.  Cardiovascular: Normal rate, regular rhythm and normal heart sounds.   Pulmonary/Chest: Effort normal and breath sounds normal. No respiratory distress. She has no wheezes. She has no rales.  Abdominal: Soft. Bowel sounds are normal.  Musculoskeletal: She exhibits no edema.  Vitals reviewed.         Assessment & Plan:  Diabetes mellitus type II, controlled - Plan: Hemoglobin A1c  I will check hemoglobin A1c today. Goal hemoglobin A1c is less than 6.5. Blood pressure at home is better controlled than here. Have the patient check her blood pressure everyday for the next week and notify me of the values. Increase medication if greater than 140/90. Patient's dyspnea was apparently due to deconditioning and weight gain. Her symptoms are improved since she has lost 6 pounds. Continue the medication changes that have led to her weight loss and recheck in December

## 2015-02-11 LAB — HEMOGLOBIN A1C
Hgb A1c MFr Bld: 6.5 % — ABNORMAL HIGH (ref <5.7)
Mean Plasma Glucose: 140 mg/dL — ABNORMAL HIGH (ref <117)

## 2015-03-20 ENCOUNTER — Other Ambulatory Visit: Payer: Self-pay | Admitting: Family Medicine

## 2015-03-24 LAB — HM DIABETES EYE EXAM

## 2015-04-21 ENCOUNTER — Ambulatory Visit (INDEPENDENT_AMBULATORY_CARE_PROVIDER_SITE_OTHER): Payer: Medicare Other | Admitting: *Deleted

## 2015-04-21 DIAGNOSIS — Z23 Encounter for immunization: Secondary | ICD-10-CM | POA: Diagnosis not present

## 2015-05-27 ENCOUNTER — Other Ambulatory Visit: Payer: Self-pay | Admitting: Family Medicine

## 2015-06-10 ENCOUNTER — Other Ambulatory Visit: Payer: Self-pay | Admitting: Family Medicine

## 2015-06-10 ENCOUNTER — Encounter: Payer: Self-pay | Admitting: Family Medicine

## 2015-06-10 NOTE — Telephone Encounter (Signed)
Medication refill for one time only.  Patient needs to be seen.  Letter sent for patient to call and schedule 

## 2015-07-16 ENCOUNTER — Other Ambulatory Visit: Payer: Self-pay | Admitting: Family Medicine

## 2015-07-16 NOTE — Telephone Encounter (Signed)
Refill appropriate and filled per protocol. 

## 2015-08-13 ENCOUNTER — Other Ambulatory Visit: Payer: Self-pay | Admitting: Family Medicine

## 2015-08-13 MED ORDER — SITAGLIPTIN PHOS-METFORMIN HCL 50-1000 MG PO TABS: ORAL_TABLET | ORAL | Status: DC

## 2015-08-13 MED ORDER — VALSARTAN 160 MG PO TABS: 160.0000 mg | ORAL_TABLET | Freq: Every day | ORAL | Status: DC

## 2015-08-20 ENCOUNTER — Other Ambulatory Visit: Payer: Self-pay | Admitting: Family Medicine

## 2015-08-20 MED ORDER — SITAGLIPTIN PHOS-METFORMIN HCL 50-1000 MG PO TABS: ORAL_TABLET | ORAL | Status: DC

## 2015-08-20 NOTE — Telephone Encounter (Signed)
Medication called/sent to requested pharmacy for 90 days 

## 2015-09-24 ENCOUNTER — Telehealth: Payer: Self-pay | Admitting: Family Medicine

## 2015-09-24 MED ORDER — HYDROCORTISONE ACETATE 25 MG RE SUPP: 25.0000 mg | Freq: Two times a day (BID) | RECTAL | Status: DC

## 2015-09-24 NOTE — Telephone Encounter (Signed)
Patient is calling to get refill on her Hemorid med if possible   [place] [facility]

## 2015-09-24 NOTE — Telephone Encounter (Signed)
Medication called/sent to requested pharmacy  

## 2015-09-25 ENCOUNTER — Telehealth: Payer: Self-pay | Admitting: *Deleted

## 2015-09-25 MED ORDER — HYDROCORTISONE 1 % RE CREA: TOPICAL_CREAM | RECTAL | Status: DC

## 2015-09-25 NOTE — Telephone Encounter (Signed)
Received fax from pharmacy.   Reports that insurance will not cover Anusol Suppositories.   Prescription sent to pharmacy for Hydrocortisone 1% cream.

## 2015-09-29 ENCOUNTER — Other Ambulatory Visit: Payer: Self-pay | Admitting: Family Medicine

## 2015-10-07 ENCOUNTER — Ambulatory Visit: Payer: Medicare Other | Admitting: Family Medicine

## 2015-10-17 ENCOUNTER — Encounter: Payer: Self-pay | Admitting: Women's Health

## 2015-11-10 ENCOUNTER — Encounter: Payer: Self-pay | Admitting: Family Medicine

## 2015-11-10 ENCOUNTER — Ambulatory Visit (INDEPENDENT_AMBULATORY_CARE_PROVIDER_SITE_OTHER): Payer: Medicare Other | Admitting: Family Medicine

## 2015-11-10 VITALS — BP 138/70 | HR 88 | Temp 98.9°F | Resp 14 | Ht 64.0 in | Wt 172.0 lb

## 2015-11-10 DIAGNOSIS — N390 Urinary tract infection, site not specified: Secondary | ICD-10-CM

## 2015-11-10 DIAGNOSIS — N760 Acute vaginitis: Secondary | ICD-10-CM

## 2015-11-10 LAB — URINALYSIS, ROUTINE W REFLEX MICROSCOPIC
Bilirubin Urine: NEGATIVE
Glucose, UA: NEGATIVE
Hgb urine dipstick: NEGATIVE
KETONES UR: NEGATIVE
NITRITE: NEGATIVE
PH: 5.5 (ref 5.0–8.0)
Protein, ur: NEGATIVE
SPECIFIC GRAVITY, URINE: 1.015 (ref 1.001–1.035)

## 2015-11-10 LAB — URINALYSIS, MICROSCOPIC ONLY
Casts: NONE SEEN [LPF]
Crystals: NONE SEEN [HPF]
Yeast: NONE SEEN [HPF]

## 2015-11-10 MED ORDER — NYSTATIN 100000 UNIT/GM EX OINT: 1.0000 "application " | TOPICAL_OINTMENT | Freq: Two times a day (BID) | CUTANEOUS | Status: DC

## 2015-11-10 MED ORDER — CEPHALEXIN 500 MG PO CAPS: 500.0000 mg | ORAL_CAPSULE | Freq: Four times a day (QID) | ORAL | Status: DC

## 2015-11-10 NOTE — Progress Notes (Signed)
Patient ID: Kathryn Wall, female   DOB: 1945-11-14, 70 y.o.   MRN: [id number]   Subjective:    Patient ID: Kathryn Wall, female    DOB: Dec 26, 1945, 70 y.o.   MRN: [id number]  Patient presents for Vaginitis Patient here with urinary frequency mild urgency for the past couple weeks. She also visited irritation around her clitoris region and the labium minora. She's not sure she has a urinary tract infection or issues from her hemorrhoids that is causing this burning sensation. No blood in urine, no vaginal discharge   Recently with her hemorrhoid she's been having some hard bowel movements and therefore she has been trying some over-the-counter hemorrhoid medication she now has some suppositories prescribed.    Review Of Systems:  GEN- denies fatigue, fever, weight loss,weakness, recent illness HEENT- denies eye drainage, change in vision, nasal discharge, CVS- denies chest pain, palpitations RESP- denies SOB, cough, wheeze ABD- denies N/V, change in stools, abd pain GU- + dysuria, denies hematuria, dribbling, incontinence MSK- denies joint pain, muscle aches, injury Neuro- denies headache, dizziness, syncope, seizure activity       Objective:    BP 138/70 mmHg  Pulse 88  Temp(Src) 98.9 F (37.2 C) (Oral)  Resp 14  Ht 5\' 4"  (1.626 m)  Wt 172 lb (78.019 kg)  BMI 29.51 kg/m2 GEN- NAD, alert and oriented x3 CVS- RRR, no murmur RESP-CTAB ABD-NABS,soft,NT,ND, no CVA tenderness GU-  vaginal mucosa atrophy- mild white discharge in creases of labia, maceration of skin, with 2 small abrasions 1 at 2 oclock postion to clitoral hood, 2nd lesion on perineum, hemorroidal tag noted ,          Assessment & Plan:      Problem List Items Addressed This Visit    None    Visit Diagnoses    UTI (lower urinary tract infection)    -  Primary    Start keflex, send for culture    Relevant Medications    nystatin ointment (MYCOSTATIN)    cephALEXin (KEFLEX) 500 MG capsule    Other Relevant Orders    Urinalysis, Routine w reflex microscopic (not at [facility]) (Completed)    Urine culture    Vaginitis and vulvovaginitis        Topical nystatin , most likley yeast causing the maceration discharge externally       Note: This dictation was prepared with Dragon dictation along with smaller phrase technology. Any transcriptional errors that result from this process are unintentional.

## 2015-11-10 NOTE — Patient Instructions (Signed)
Apply cream twice a day  Take antibiotics F/U as needed

## 2015-11-18 ENCOUNTER — Telehealth: Payer: Self-pay | Admitting: Family Medicine

## 2015-11-18 DIAGNOSIS — R309 Painful micturition, unspecified: Secondary | ICD-10-CM

## 2015-11-18 NOTE — Telephone Encounter (Signed)
Pt left a VM stating that she would like to know the results of her urine cu

## 2015-11-19 NOTE — Telephone Encounter (Signed)
Pt aware and states that she is still having some pain symptoms and will come by tomorrow to leave another sample. Orders placed.

## 2015-11-19 NOTE — Telephone Encounter (Signed)
Tell her culture was not sent, error on lab If her symptoms have resolved nothing further, if not drop off another sample and send for UA/Culture

## 2015-11-20 ENCOUNTER — Other Ambulatory Visit: Payer: Medicare Other

## 2015-11-20 DIAGNOSIS — R309 Painful micturition, unspecified: Secondary | ICD-10-CM

## 2015-11-20 LAB — URINALYSIS, ROUTINE W REFLEX MICROSCOPIC
Bilirubin Urine: NEGATIVE
Glucose, UA: NEGATIVE
Hgb urine dipstick: NEGATIVE
Ketones, ur: NEGATIVE
LEUKOCYTES UA: NEGATIVE
NITRITE: NEGATIVE
PH: 5.5 (ref 5.0–8.0)
Protein, ur: NEGATIVE
SPECIFIC GRAVITY, URINE: 1.015 (ref 1.001–1.035)

## 2015-11-23 LAB — URINE CULTURE: Colony Count: 50000

## 2015-11-25 ENCOUNTER — Other Ambulatory Visit: Payer: Self-pay | Admitting: *Deleted

## 2015-11-25 MED ORDER — ONDANSETRON HCL 4 MG PO TABS: 4.0000 mg | ORAL_TABLET | Freq: Three times a day (TID) | ORAL | Status: DC | PRN

## 2015-11-25 MED ORDER — CIPROFLOXACIN HCL 500 MG PO TABS: 500.0000 mg | ORAL_TABLET | Freq: Two times a day (BID) | ORAL | Status: DC

## 2015-12-01 ENCOUNTER — Telehealth: Payer: Self-pay | Admitting: Family Medicine

## 2015-12-01 NOTE — Telephone Encounter (Signed)
Pt is still having symptoms of a UTI and is wondering if she needs another round of Cipro. [place] [phone] Archie Patten)

## 2015-12-02 MED ORDER — CIPROFLOXACIN HCL 500 MG PO TABS: 500.0000 mg | ORAL_TABLET | Freq: Two times a day (BID) | ORAL | Status: DC

## 2015-12-02 NOTE — Telephone Encounter (Signed)
Send another 5 days of Cipro 500mg  BID  If she is still symptomatic she needs another OV

## 2015-12-02 NOTE — Telephone Encounter (Signed)
Left message advising patient a new cipro rx has been sent to pharmacy and if this does not help she needs an office visit.

## 2015-12-10 ENCOUNTER — Other Ambulatory Visit: Payer: Self-pay | Admitting: Family Medicine

## 2015-12-10 MED ORDER — SITAGLIPTIN PHOS-METFORMIN HCL 50-1000 MG PO TABS: ORAL_TABLET | ORAL | Status: DC

## 2016-01-06 ENCOUNTER — Encounter: Payer: Self-pay | Admitting: Women's Health

## 2016-01-06 ENCOUNTER — Ambulatory Visit (INDEPENDENT_AMBULATORY_CARE_PROVIDER_SITE_OTHER): Payer: Medicare Other | Admitting: Women's Health

## 2016-01-06 VITALS — BP 130/80 | Ht 64.0 in | Wt 172.0 lb

## 2016-01-06 DIAGNOSIS — N898 Other specified noninflammatory disorders of vagina: Secondary | ICD-10-CM

## 2016-01-06 DIAGNOSIS — R350 Frequency of micturition: Secondary | ICD-10-CM | POA: Diagnosis not present

## 2016-01-06 LAB — WET PREP FOR TRICH, YEAST, CLUE
Clue Cells Wet Prep HPF POC: NONE SEEN
Trich, Wet Prep: NONE SEEN
YEAST WET PREP: NONE SEEN

## 2016-01-06 MED ORDER — FLUCONAZOLE 150 MG PO TABS: 150.0000 mg | ORAL_TABLET | Freq: Once | ORAL | Status: DC

## 2016-01-06 NOTE — Progress Notes (Signed)
Patient ID: Kathryn Wall, female   DOB: [birth year], 70 y.o.   MRN: [id number] Presents with complaint of vaginal itching with burning sensation. Was treated for a UTI at primary care May 23 was on Cipro for 1 week , vaginal symptoms have persisted. Was checked at primary care and was found to have a normal exam. Denies abdominal pain, fever, but has had increased fatigue. History of a TAH on no HRT, history of breast cancer.  Exam: Appears well. External genitalia extremely erythematous, wet prep done with a Q-tip negative.  Vaginal irritation/ clinical yeast  Plan: Diflucan 150 times one dose. Apply A and D ointment externally  twice daily, loose clothing, instructed to call if symptoms persist.

## 2016-01-07 LAB — URINALYSIS W MICROSCOPIC + REFLEX CULTURE
BACTERIA UA: NONE SEEN [HPF]
Bilirubin Urine: NEGATIVE
CASTS: NONE SEEN [LPF]
CRYSTALS: NONE SEEN [HPF]
Glucose, UA: NEGATIVE
HGB URINE DIPSTICK: NEGATIVE
KETONES UR: NEGATIVE
Leukocytes, UA: NEGATIVE
Nitrite: NEGATIVE
PH: 6 (ref 5.0–8.0)
Protein, ur: NEGATIVE
RBC / HPF: NONE SEEN RBC/HPF (ref <=2)
SQUAMOUS EPITHELIAL / LPF: NONE SEEN [HPF] (ref <=5)
Specific Gravity, Urine: 1.011 (ref 1.001–1.035)
WBC, UA: NONE SEEN WBC/HPF (ref <=5)
Yeast: NONE SEEN [HPF]

## 2016-02-04 ENCOUNTER — Other Ambulatory Visit: Payer: Self-pay | Admitting: Family Medicine

## 2016-03-05 ENCOUNTER — Encounter: Payer: Medicare Other | Admitting: Family Medicine

## 2016-04-10 ENCOUNTER — Other Ambulatory Visit: Payer: Self-pay | Admitting: Family Medicine

## 2016-05-25 ENCOUNTER — Ambulatory Visit (INDEPENDENT_AMBULATORY_CARE_PROVIDER_SITE_OTHER): Payer: Medicare Other

## 2016-05-25 DIAGNOSIS — Z23 Encounter for immunization: Secondary | ICD-10-CM | POA: Diagnosis not present

## 2016-05-31 ENCOUNTER — Other Ambulatory Visit: Payer: Medicare Other

## 2016-05-31 ENCOUNTER — Other Ambulatory Visit: Payer: Self-pay | Admitting: Family Medicine

## 2016-05-31 DIAGNOSIS — N183 Chronic kidney disease, stage 3 unspecified: Secondary | ICD-10-CM

## 2016-05-31 DIAGNOSIS — E1122 Type 2 diabetes mellitus with diabetic chronic kidney disease: Secondary | ICD-10-CM

## 2016-05-31 DIAGNOSIS — Z0000 Encounter for general adult medical examination without abnormal findings: Secondary | ICD-10-CM

## 2016-05-31 DIAGNOSIS — I10 Essential (primary) hypertension: Secondary | ICD-10-CM

## 2016-05-31 DIAGNOSIS — Z79899 Other long term (current) drug therapy: Secondary | ICD-10-CM

## 2016-05-31 DIAGNOSIS — E785 Hyperlipidemia, unspecified: Secondary | ICD-10-CM

## 2016-05-31 LAB — COMPLETE METABOLIC PANEL WITH GFR
ALBUMIN: 4.3 g/dL (ref 3.6–5.1)
ALK PHOS: 77 U/L (ref 33–130)
ALT: 32 U/L — ABNORMAL HIGH (ref 6–29)
AST: 39 U/L — ABNORMAL HIGH (ref 10–35)
BUN: 17 mg/dL (ref 7–25)
CALCIUM: 9.4 mg/dL (ref 8.6–10.4)
CO2: 23 mmol/L (ref 20–31)
Chloride: 105 mmol/L (ref 98–110)
Creat: 1.19 mg/dL — ABNORMAL HIGH (ref 0.60–0.93)
GFR, EST NON AFRICAN AMERICAN: 46 mL/min — AB (ref >=60)
GFR, Est African American: 53 mL/min — ABNORMAL LOW (ref >=60)
Glucose, Bld: 150 mg/dL — ABNORMAL HIGH (ref 70–99)
POTASSIUM: 5 mmol/L (ref 3.5–5.3)
Sodium: 137 mmol/L (ref 135–146)
Total Bilirubin: 0.4 mg/dL (ref 0.2–1.2)
Total Protein: 6.8 g/dL (ref 6.1–8.1)

## 2016-05-31 LAB — CBC WITH DIFFERENTIAL/PLATELET
BASOS ABS: 76 {cells}/uL (ref 0–200)
Basophils Relative: 1 %
Eosinophils Absolute: 380 cells/uL (ref 15–500)
Eosinophils Relative: 5 %
HEMATOCRIT: 40.7 % (ref 35.0–45.0)
HEMOGLOBIN: 13.1 g/dL (ref 12.0–15.0)
LYMPHS ABS: 3496 {cells}/uL (ref 850–3900)
Lymphocytes Relative: 46 %
MCH: 29.8 pg (ref 27.0–33.0)
MCHC: 32.2 g/dL (ref 32.0–36.0)
MCV: 92.7 fL (ref 80.0–100.0)
MONO ABS: 532 {cells}/uL (ref 200–950)
MPV: 11.7 fL (ref 7.5–12.5)
Monocytes Relative: 7 %
NEUTROS ABS: 3116 {cells}/uL (ref 1500–7800)
NEUTROS PCT: 41 %
Platelets: 293 10*3/uL (ref 140–400)
RBC: 4.39 MIL/uL (ref 3.80–5.10)
RDW: 14.3 % (ref 11.0–15.0)
WBC: 7.6 10*3/uL (ref 3.8–10.8)

## 2016-05-31 LAB — LIPID PANEL
Cholesterol: 168 mg/dL (ref <200)
HDL: 38 mg/dL — AB (ref >50)
LDL Cholesterol: 71 mg/dL (ref <100)
TRIGLYCERIDES: 296 mg/dL — AB (ref <150)
Total CHOL/HDL Ratio: 4.4 Ratio (ref <5.0)
VLDL: 59 mg/dL — ABNORMAL HIGH (ref <30)

## 2016-05-31 LAB — TSH: TSH: 3.84 m[IU]/L

## 2016-06-04 ENCOUNTER — Ambulatory Visit (INDEPENDENT_AMBULATORY_CARE_PROVIDER_SITE_OTHER): Payer: Medicare Other | Admitting: Family Medicine

## 2016-06-04 ENCOUNTER — Encounter: Payer: Self-pay | Admitting: Family Medicine

## 2016-06-04 VITALS — BP 170/100 | HR 82 | Temp 98.0°F | Resp 18 | Ht 64.0 in | Wt 171.0 lb

## 2016-06-04 DIAGNOSIS — I10 Essential (primary) hypertension: Secondary | ICD-10-CM

## 2016-06-04 DIAGNOSIS — Z780 Asymptomatic menopausal state: Secondary | ICD-10-CM

## 2016-06-04 DIAGNOSIS — Z0000 Encounter for general adult medical examination without abnormal findings: Secondary | ICD-10-CM

## 2016-06-04 DIAGNOSIS — E1165 Type 2 diabetes mellitus with hyperglycemia: Secondary | ICD-10-CM

## 2016-06-04 DIAGNOSIS — E118 Type 2 diabetes mellitus with unspecified complications: Secondary | ICD-10-CM | POA: Diagnosis not present

## 2016-06-04 DIAGNOSIS — IMO0002 Reserved for concepts with insufficient information to code with codable children: Secondary | ICD-10-CM

## 2016-06-04 DIAGNOSIS — N183 Chronic kidney disease, stage 3 unspecified: Secondary | ICD-10-CM

## 2016-06-04 LAB — HEMOGLOBIN A1C, FINGERSTICK: HEMOGLOBIN A1C, FINGERSTICK: 7.1 % — AB (ref <5.7)

## 2016-06-04 MED ORDER — NYSTATIN 100000 UNIT/GM EX OINT: 1.0000 "application " | TOPICAL_OINTMENT | Freq: Two times a day (BID) | CUTANEOUS | 0 refills | Status: DC

## 2016-06-04 NOTE — Progress Notes (Signed)
Subjective:    Patient ID: Kathryn Wall, female    DOB: [birth year], 70 y.o.   MRN: [id number]  HPI Patient is here for complete physical exam. Mammogram was performed in April and is up-to-date. Patient had a colonoscopy in 2015 and this is up-to-date. Due to her age, she does not require a Pap smear. Immunizations are up-to-date except for the shingles vaccine and the tetanus shot. She declines both of these at the present time: Immunization History  Administered Date(s) Administered  . Influenza Split 05/31/2011  . Influenza, High Dose Seasonal PF 04/30/2013  . Influenza,inj,Quad PF,36+ Mos 04/21/2015, 05/25/2016  . Pneumococcal Conjugate-13 08/08/2013, 10/18/2013  . Pneumococcal Polysaccharide-23 05/31/2011   Most recent lab work as listed below. Fingerstick hemoglobin A1c today in office was 7.1  Office Visit on 06/04/2016  Component Date Value Ref Range Status  . Hgb A1C (fingerstick) 06/04/2016 7.1* <5.7 % Final   Comment:                                                                        According to the ADA Clinical Practice Recommendations for 2011, when HbA1c is used as a screening test:     >=6.5%   Diagnostic of Diabetes Mellitus            (if abnormal result is confirmed)   5.7-6.4%   Increased risk of developing Diabetes Mellitus   References:Diagnosis and Classification of Diabetes Mellitus,Diabetes NTIR,4431,54(MGQQP 1):S62-S69 and Standards of Medical Care in         Diabetes - 2011,Diabetes YPPJ,0932,67 (Suppl 1):S11-S61.     Appointment on 05/31/2016  Component Date Value Ref Range Status  . Sodium 05/31/2016 137  135 - 146 mmol/L Final  . Potassium 05/31/2016 5.0  3.5 - 5.3 mmol/L Final  . Chloride 05/31/2016 105  98 - 110 mmol/L Final  . CO2 05/31/2016 23  20 - 31 mmol/L Final  . Glucose, Bld 05/31/2016 150* 70 - 99 mg/dL Final  . BUN 05/31/2016 17  7 - 25 mg/dL Final  . Creat 05/31/2016 1.19* 0.60 - 0.93 mg/dL Final   Comment:   For patients  > or = 72 years of age: The upper reference limit for Creatinine is approximately 13% higher for people identified as African-American.     . Total Bilirubin 05/31/2016 0.4  0.2 - 1.2 mg/dL Final  . Alkaline Phosphatase 05/31/2016 77  33 - 130 U/L Final  . AST 05/31/2016 39* 10 - 35 U/L Final  . ALT 05/31/2016 32* 6 - 29 U/L Final  . Total Protein 05/31/2016 6.8  6.1 - 8.1 g/dL Final  . Albumin 05/31/2016 4.3  3.6 - 5.1 g/dL Final  . Calcium 05/31/2016 9.4  8.6 - 10.4 mg/dL Final  . GFR, Est African American 05/31/2016 53* >=60 mL/min Final  . GFR, Est Non African American 05/31/2016 46* >=60 mL/min Final  . TSH 05/31/2016 3.84  mIU/L Final   Comment:   Reference Range   > or = 20 Years  0.40-4.50   Pregnancy Range First trimester  0.26-2.66 Second trimester 0.55-2.73 Third trimester  0.43-2.91     . Cholesterol 05/31/2016 168  <200 mg/dL Final   Comment: ** Please note change in reference  range(s). **     . Triglycerides 05/31/2016 296* <150 mg/dL Final   Comment: ** Please note change in reference range(s). **     . HDL 05/31/2016 38* >50 mg/dL Final   Comment: ** Please note change in reference range(s). **     . Total CHOL/HDL Ratio 05/31/2016 4.4  <5.0 Ratio Final  . VLDL 05/31/2016 59* <30 mg/dL Final  . LDL Cholesterol 05/31/2016 71  <100 mg/dL Final   Comment: ** Please note change in reference range(s). **     . WBC 05/31/2016 7.6  3.8 - 10.8 K/uL Final  . RBC 05/31/2016 4.39  3.80 - 5.10 MIL/uL Final  . Hemoglobin 05/31/2016 13.1  12.0 - 15.0 g/dL Final  . HCT 05/31/2016 40.7  35.0 - 45.0 % Final  . MCV 05/31/2016 92.7  80.0 - 100.0 fL Final  . MCH 05/31/2016 29.8  27.0 - 33.0 pg Final  . MCHC 05/31/2016 32.2  32.0 - 36.0 g/dL Final  . RDW 05/31/2016 14.3  11.0 - 15.0 % Final  . Platelets 05/31/2016 293  140 - 400 K/uL Final  . MPV 05/31/2016 11.7  7.5 - 12.5 fL Final  . Neutro Abs 05/31/2016 3116  1,500 - 7,800 cells/uL Final  . Lymphs Abs 05/31/2016  3496  850 - 3,900 cells/uL Final  . Monocytes Absolute 05/31/2016 532  200 - 950 cells/uL Final  . Eosinophils Absolute 05/31/2016 380  15 - 500 cells/uL Final  . Basophils Absolute 05/31/2016 76  0 - 200 cells/uL Final  . Neutrophils Relative % 05/31/2016 41  % Final  . Lymphocytes Relative 05/31/2016 46  % Final  . Monocytes Relative 05/31/2016 7  % Final  . Eosinophils Relative 05/31/2016 5  % Final  . Basophils Relative 05/31/2016 1  % Final  . Smear Review 05/31/2016 Criteria for review not met   Final   She is due for diabetic eye exam which I recommended today. She is also due for a bone density. She admits that she is drinking several Pepsi's every day in addition to sweet tea. Past Medical History:  Diagnosis Date  . Breast cancer ([place]) 1997   AGE 7, BRCA 1 NEGATIVE 2. 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 CKD (chronic kidney disease), stage III   . Diabetes mellitus    TYPE II  . High cholesterol   . Hypertension    Past Surgical History:  Procedure Laterality Date  . ABDOMINAL HYSTERECTOMY  1985   TAH  . BREAST SURGERY  1997   RIGHT BREAST LUMPECTOMY  . CHOLECYSTECTOMY  1980   Current Outpatient Prescriptions on File Prior to Visit  Medication Sig Dispense Refill  . ALPRAZolam (XANAX) 0.5 MG tablet 1 mg at bedtime as needed for sleep.     Marland Kitchen amLODipine (NORVASC) 5 MG tablet TAKE 1 TABLET BY MOUTH ONCE A DAY 30 tablet 11  . atorvastatin (LIPITOR) 40 MG tablet TAKE 1 TABLET BY MOUTH DAILY AT BEDTIME 90 tablet 3  . buPROPion (WELLBUTRIN SR) 200 MG 12 hr tablet Take 1 tablet by mouth daily. Reported on 01/06/2016    . clotrimazole-betamethasone (LOTRISONE) cream Apply 1 application topically 2 (two) times daily. X 2 weeks 45 g 0  . hydrocortisone (PROCTOCORT) 1 % CREA Use 1 application BID PRN. 30 g 1  . Multiple Vitamins-Minerals (MULTIVITAMINS THER. W/MINERALS) TABS Take 1 tablet by mouth daily. Reported on 11/10/2015    . sitaGLIPtin-metformin  (JANUMET) 50-1000 MG tablet TAKE  1 TABLET BY MOUTH TWICE A DAY WITH A MEAL 180 tablet 3  . pantoprazole (PROTONIX) 40 MG tablet TAKE 1 TABLET BY MOUTH EVERY MORNING 30 tablet 11   No current facility-administered medications on file prior to visit.    Allergies  Allergen Reactions  . Allergen [Antipyrine-Benzocaine]   . Ciprofloxacin     Nausea   . Cymbalta [Duloxetine Hcl]   . Other     SENSITIVE TO ANTIBIOTICS   Social History   Social History  . Marital status: Married    Spouse name: N/A  . Number of children: N/A  . Years of education: N/A   Occupational History  . Not on file.   Social History Main Topics  . Smoking status: Never Smoker  . Smokeless tobacco: Never Used  . Alcohol use No  . Drug use: No  . Sexual activity: Yes    Birth control/ protection: Post-menopausal, Surgical     Comment: HYST   Other Topics Concern  . Not on file   Social History Narrative  . No narrative on file   Family History  Problem Relation Age of Onset  . Cancer Mother     COLON  . Hypertension Father   . Heart disease Father       Review of Systems  All other systems reviewed and are negative.      Objective:   Physical Exam  Constitutional: She is oriented to person, place, and time. She appears well-developed and well-nourished. No distress.  HENT:  Head: Normocephalic and atraumatic.  Right Ear: External ear normal.  Left Ear: External ear normal.  Nose: Nose normal.  Mouth/Throat: Oropharynx is clear and moist. No oropharyngeal exudate.  Eyes: Conjunctivae and EOM are normal. Pupils are equal, round, and reactive to light. Right eye exhibits no discharge. Left eye exhibits no discharge. No scleral icterus.  Neck: Normal range of motion. Neck supple. No JVD present. No tracheal deviation present. No thyromegaly present.  Cardiovascular: Normal rate, regular rhythm, normal heart sounds and intact distal pulses.  Exam reveals no gallop and no friction rub.   No  murmur heard. Pulmonary/Chest: Effort normal and breath sounds normal. No stridor. No respiratory distress. She has no wheezes. She has no rales. She exhibits no tenderness.  Abdominal: Soft. Bowel sounds are normal. She exhibits no distension and no mass. There is no tenderness. There is no rebound and no guarding.  Musculoskeletal: Normal range of motion. She exhibits no edema, tenderness or deformity.  Lymphadenopathy:    She has no cervical adenopathy.  Neurological: She is alert and oriented to person, place, and time. She has normal reflexes. She displays normal reflexes. No cranial nerve deficit. She exhibits normal muscle tone. Coordination normal.  Skin: Skin is warm. No rash noted. She is not diaphoretic. No erythema. No pallor.  Psychiatric: She has a normal mood and affect. Her behavior is normal. Judgment and thought content normal.  Vitals reviewed.         Assessment & Plan:  Uncontrolled type 2 diabetes mellitus with complication, without long-term current use of insulin (HCC) - Plan: Hemoglobin A1C, fingerstick  Benign essential HTN  CKD (chronic kidney disease), stage III  Routine general medical examination at a health care facility  Postmenopausal estrogen deficiency - Plan: DG Bone Density  Patient's blood pressures extremely high today. She states that she is very anxious and is never this high at home. She will check it everyday at home over the weekend and notify  me Monday of the values. If consistently greater than 140/90, we will need to increase medication to address recommended diabetic eye exam. We'll schedule the patient for a bone density. She is taking excessive amounts of Tylenol. This may account for the elevation in her liver function test. Discontinue all Tylenol. Recheck liver function test in 2 weeks. If consistently elevated, we may need to discontinue Lipitor or check a right upper quadrant ultrasound. To address her blood sugars, she needs to  discontinue all Pepsi and sweet tea. This will prevent her from having to take more medication. Recheck in 3 months.

## 2016-06-07 ENCOUNTER — Telehealth: Payer: Self-pay | Admitting: Family Medicine

## 2016-06-07 NOTE — Telephone Encounter (Signed)
She needs to talk to her psychiatrist.  I think is is not safe for her to be taking sonata, lunesta, and xanax altogether.  BP is too high, add cardura 4 mg poqday and recheck bp in 1 month.

## 2016-06-07 NOTE — Telephone Encounter (Signed)
Pt called with BP readings -  152/109,149/97,157/109,156/103,164/111,160/101- She is taking the valsartan and amlodipine.  Also for sleep she takes Lunesta 3mg , Sonata 10mg  and Xanax .5mg  to help her sleep and she states that she still does not get much sleep.  (Needs coupon for Janumet and 30 rx for a free months worth of medication)

## 2016-06-08 MED ORDER — SITAGLIPTIN PHOS-METFORMIN HCL 50-1000 MG PO TABS: ORAL_TABLET | ORAL | 0 refills | Status: DC

## 2016-06-09 MED ORDER — DOXAZOSIN MESYLATE 4 MG PO TABS: 4.0000 mg | ORAL_TABLET | Freq: Every day | ORAL | 1 refills | Status: DC

## 2016-06-09 NOTE — Telephone Encounter (Signed)
Patient aware of providers recommendations. And med sent to pharm 

## 2016-06-16 ENCOUNTER — Other Ambulatory Visit: Payer: Self-pay | Admitting: Family Medicine

## 2016-06-16 DIAGNOSIS — R945 Abnormal results of liver function studies: Principal | ICD-10-CM

## 2016-06-16 DIAGNOSIS — R7989 Other specified abnormal findings of blood chemistry: Secondary | ICD-10-CM

## 2016-06-18 ENCOUNTER — Other Ambulatory Visit: Payer: Medicare Other

## 2016-06-18 DIAGNOSIS — R7989 Other specified abnormal findings of blood chemistry: Secondary | ICD-10-CM

## 2016-06-18 DIAGNOSIS — R945 Abnormal results of liver function studies: Principal | ICD-10-CM

## 2016-06-18 LAB — HEPATIC FUNCTION PANEL
ALT: 27 U/L (ref 6–29)
AST: 28 U/L (ref 10–35)
Albumin: 4 g/dL (ref 3.6–5.1)
Alkaline Phosphatase: 69 U/L (ref 33–130)
BILIRUBIN DIRECT: 0.1 mg/dL (ref <=0.2)
BILIRUBIN TOTAL: 0.5 mg/dL (ref 0.2–1.2)
Indirect Bilirubin: 0.4 mg/dL (ref 0.2–1.2)
Total Protein: 6.8 g/dL (ref 6.1–8.1)

## 2016-09-08 ENCOUNTER — Other Ambulatory Visit: Payer: Self-pay | Admitting: Family Medicine

## 2016-09-30 ENCOUNTER — Other Ambulatory Visit: Payer: Self-pay | Admitting: Family Medicine

## 2016-10-01 NOTE — Telephone Encounter (Signed)
Medication refill for one time only.  Patient needs to be seen.  Letter sent for patient to call and schedule 

## 2016-10-04 LAB — HM DEXA SCAN

## 2016-10-04 LAB — HM MAMMOGRAPHY

## 2016-10-08 ENCOUNTER — Encounter: Payer: Self-pay | Admitting: Gynecology

## 2016-10-14 ENCOUNTER — Ambulatory Visit: Payer: Medicare Other | Admitting: Family Medicine

## 2016-10-15 ENCOUNTER — Encounter: Payer: Self-pay | Admitting: Family Medicine

## 2016-10-15 DIAGNOSIS — M85852 Other specified disorders of bone density and structure, left thigh: Principal | ICD-10-CM

## 2016-10-15 DIAGNOSIS — M85851 Other specified disorders of bone density and structure, right thigh: Secondary | ICD-10-CM | POA: Insufficient documentation

## 2016-10-22 ENCOUNTER — Encounter: Payer: Self-pay | Admitting: Family Medicine

## 2016-10-22 ENCOUNTER — Ambulatory Visit (INDEPENDENT_AMBULATORY_CARE_PROVIDER_SITE_OTHER): Payer: Medicare Other | Admitting: Family Medicine

## 2016-10-22 VITALS — BP 126/80 | HR 94 | Temp 97.8°F | Resp 18 | Ht 64.0 in | Wt 167.0 lb

## 2016-10-22 DIAGNOSIS — E119 Type 2 diabetes mellitus without complications: Secondary | ICD-10-CM | POA: Diagnosis not present

## 2016-10-22 DIAGNOSIS — N183 Chronic kidney disease, stage 3 unspecified: Secondary | ICD-10-CM

## 2016-10-22 DIAGNOSIS — I10 Essential (primary) hypertension: Secondary | ICD-10-CM | POA: Diagnosis not present

## 2016-10-22 NOTE — Progress Notes (Signed)
Subjective:    Patient ID: Kathryn Wall, female    DOB: 1945/10/20, 71 y.o.   MRN: [id number]  HPI Patient is here for follow up.  In December, patient was started on Cardura for elevated blood pressure.  She is tolerating the medication well with no side effects. Her blood pressure today is much better controlled at 126/80. She denies any chest pain shortness of breath or dyspnea on exertion. She also however is due for fasting lab work. She has a history of diabetes mellitus type 2. She is long overdue for hemoglobin A1c and a fasting lipid panel. She denies any polyuria, polydipsia, or blurry vision. She denies any myalgias or right upper quadrant pain Past Medical History:  Diagnosis Date  . Breast cancer ([place]) 1997   AGE 1, BRCA 1 NEGATIVE 2. 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 CKD (chronic kidney disease), stage III   . Diabetes mellitus    TYPE II  . High cholesterol   . Hypertension    Past Surgical History:  Procedure Laterality Date  . ABDOMINAL HYSTERECTOMY  1985   TAH  . BREAST SURGERY  1997   RIGHT BREAST LUMPECTOMY  . CHOLECYSTECTOMY  1980   Current Outpatient Prescriptions on File Prior to Visit  Medication Sig Dispense Refill  . ALPRAZolam (XANAX) 0.5 MG tablet 1 mg at bedtime as needed for sleep.     Marland Kitchen amLODipine (NORVASC) 5 MG tablet TAKE 1 TABLET BY MOUTH ONCE A DAY 30 tablet 0  . atorvastatin (LIPITOR) 40 MG tablet TAKE 1 TABLET BY MOUTH DAILY AT BEDTIME 90 tablet 3  . buPROPion (WELLBUTRIN SR) 200 MG 12 hr tablet Take 1 tablet by mouth daily. Reported on 01/06/2016    . clotrimazole-betamethasone (LOTRISONE) cream Apply 1 application topically 2 (two) times daily. X 2 weeks 45 g 0  . doxazosin (CARDURA) 4 MG tablet Take 1 tablet (4 mg total) by mouth daily. 90 tablet 1  . hydrocortisone (PROCTOCORT) 1 % CREA Use 1 application BID PRN. 30 g 1  . Multiple Vitamins-Minerals (MULTIVITAMINS THER. W/MINERALS) TABS Take 1 tablet by mouth  daily. Reported on 11/10/2015    . nystatin ointment (MYCOSTATIN) Apply 1 application topically 2 (two) times daily. 30 g 0  . pantoprazole (PROTONIX) 40 MG tablet TAKE 1 TABLET BY MOUTH EVERY MORNING 30 tablet 11  . sitaGLIPtin-metformin (JANUMET) 50-1000 MG tablet TAKE 1 TABLET BY MOUTH TWICE A DAY WITH A MEAL 60 tablet 0  . valsartan (DIOVAN) 160 MG tablet TAKE 1 TABLET BY MOUTH ONCE A DAY 90 tablet 3  . zaleplon (SONATA) 5 MG capsule Take 10 mg by mouth at bedtime as needed for sleep.     No current facility-administered medications on file prior to visit.    Allergies  Allergen Reactions  . Allergen [Antipyrine-Benzocaine]   . Ciprofloxacin     Nausea   . Cymbalta [Duloxetine Hcl]   . Other     SENSITIVE TO ANTIBIOTICS   Social History   Social History  . Marital status: Married    Spouse name: N/A  . Number of children: N/A  . Years of education: N/A   Occupational History  . Not on file.   Social History Main Topics  . Smoking status: Never Smoker  . Smokeless tobacco: Never Used  . Alcohol use No  . Drug use: No  . Sexual activity: Yes    Birth control/ protection: Post-menopausal, Surgical  Comment: HYST   Other Topics Concern  . Not on file   Social History Narrative  . No narrative on file   Family History  Problem Relation Age of Onset  . Cancer Mother     COLON  . Hypertension Father   . Heart disease Father       Review of Systems  All other systems reviewed and are negative.      Objective:   Physical Exam  Constitutional: She is oriented to person, place, and time. She appears well-developed and well-nourished. No distress.  HENT:  Head: Normocephalic and atraumatic.  Cardiovascular: Normal rate, regular rhythm, normal heart sounds and intact distal pulses.  Exam reveals no gallop and no friction rub.   No murmur heard. Pulmonary/Chest: Effort normal and breath sounds normal. No respiratory distress. She has no wheezes. She has no  rales. She exhibits no tenderness.  Abdominal: Soft. Bowel sounds are normal. She exhibits no distension and no mass. There is no tenderness. There is no rebound and no guarding.  Musculoskeletal: She exhibits no edema.  Neurological: She is alert and oriented to person, place, and time. No cranial nerve deficit. She exhibits normal muscle tone.  Skin: Skin is warm. No rash noted. She is not diaphoretic. No erythema. No pallor.  Vitals reviewed.         Assessment & Plan:  Controlled type 2 diabetes mellitus without complication, without long-term current use of insulin ([place]) - Plan: COMPLETE METABOLIC PANEL WITH GFR, CBC with Differential/Platelet, Hemoglobin A1c, Lipid panel, Microalbumin, urine  Benign essential HTN  CKD (chronic kidney disease), stage III Her blood pressure today is well controlled. I will make no changes in her medication at this time. I will check a hemoglobin A1c. Goal hemoglobin A1c is less than 6.5. I will also check a fasting lipid panel. Goal LDL cholesterol is less than 100. I will also check for diabetic nephropathy with a urine microalbumin

## 2016-10-23 LAB — COMPLETE METABOLIC PANEL WITH GFR
AG RATIO: 1.6 ratio (ref 1.0–2.5)
ALBUMIN: 4.1 g/dL (ref 3.6–5.1)
ALT: 31 U/L — ABNORMAL HIGH (ref 6–29)
AST: 28 U/L (ref 10–35)
Alkaline Phosphatase: 67 U/L (ref 33–130)
BUN/Creatinine Ratio: 14.5 Ratio (ref 6–22)
BUN: 17 mg/dL (ref 7–25)
CHLORIDE: 105 mmol/L (ref 98–110)
CO2: 20 mmol/L (ref 20–31)
Calcium: 9.1 mg/dL (ref 8.6–10.4)
Creat: 1.17 mg/dL — ABNORMAL HIGH (ref 0.60–0.93)
GFR, Est African American: 54 mL/min — ABNORMAL LOW (ref >=60)
GFR, Est Non African American: 47 mL/min — ABNORMAL LOW (ref >=60)
GLUCOSE: 149 mg/dL — AB (ref 70–99)
Globulin: 2.6 g/dL (ref 1.9–3.7)
POTASSIUM: 5.1 mmol/L (ref 3.5–5.3)
SODIUM: 137 mmol/L (ref 135–146)
Total Bilirubin: 0.5 mg/dL (ref 0.2–1.2)
Total Protein: 6.7 g/dL (ref 6.1–8.1)

## 2016-10-23 LAB — CBC WITH DIFFERENTIAL/PLATELET
BASOS ABS: 82 {cells}/uL (ref 0–200)
Basophils Relative: 1 %
EOS ABS: 328 {cells}/uL (ref 15–500)
Eosinophils Relative: 4 %
HCT: 38.6 % (ref 35.0–45.0)
Hemoglobin: 12.5 g/dL (ref 12.0–15.0)
LYMPHS ABS: 2624 {cells}/uL (ref 850–3900)
Lymphocytes Relative: 32 %
MCH: 29.8 pg (ref 27.0–33.0)
MCHC: 32.4 g/dL (ref 32.0–36.0)
MCV: 92.1 fL (ref 80.0–100.0)
MONO ABS: 492 {cells}/uL (ref 200–950)
MPV: 11.5 fL (ref 7.5–12.5)
Monocytes Relative: 6 %
NEUTROS PCT: 57 %
Neutro Abs: 4674 cells/uL (ref 1500–7800)
PLATELETS: 332 10*3/uL (ref 140–400)
RBC: 4.19 MIL/uL (ref 3.80–5.10)
RDW: 14.2 % (ref 11.0–15.0)
WBC: 8.2 10*3/uL (ref 3.8–10.8)

## 2016-10-23 LAB — LIPID PANEL
CHOL/HDL RATIO: 2.8 ratio (ref <5.0)
Cholesterol: 134 mg/dL (ref <200)
HDL: 48 mg/dL — ABNORMAL LOW (ref >50)
LDL CALC: 42 mg/dL (ref <100)
Triglycerides: 222 mg/dL — ABNORMAL HIGH (ref <150)
VLDL: 44 mg/dL — AB (ref <30)

## 2016-10-23 LAB — HEMOGLOBIN A1C
Hgb A1c MFr Bld: 6.4 % — ABNORMAL HIGH (ref <5.7)
Mean Plasma Glucose: 137 mg/dL

## 2016-10-25 ENCOUNTER — Encounter: Payer: Self-pay | Admitting: Family Medicine

## 2016-11-03 ENCOUNTER — Other Ambulatory Visit: Payer: Self-pay | Admitting: Family Medicine

## 2016-11-10 ENCOUNTER — Encounter: Payer: Self-pay | Admitting: Gynecology

## 2016-12-15 ENCOUNTER — Other Ambulatory Visit: Payer: Self-pay | Admitting: Family Medicine

## 2017-01-13 ENCOUNTER — Other Ambulatory Visit: Payer: Self-pay | Admitting: Family Medicine

## 2017-02-24 ENCOUNTER — Other Ambulatory Visit: Payer: Self-pay | Admitting: Family Medicine

## 2017-03-17 ENCOUNTER — Ambulatory Visit (INDEPENDENT_AMBULATORY_CARE_PROVIDER_SITE_OTHER): Payer: Medicare Other | Admitting: Family Medicine

## 2017-03-17 ENCOUNTER — Encounter: Payer: Self-pay | Admitting: Family Medicine

## 2017-03-17 VITALS — BP 146/98 | HR 85 | Temp 98.0°F | Resp 16 | Wt 168.0 lb

## 2017-03-17 DIAGNOSIS — Z23 Encounter for immunization: Secondary | ICD-10-CM

## 2017-03-17 DIAGNOSIS — I10 Essential (primary) hypertension: Secondary | ICD-10-CM | POA: Diagnosis not present

## 2017-03-17 DIAGNOSIS — N183 Chronic kidney disease, stage 3 unspecified: Secondary | ICD-10-CM

## 2017-03-17 DIAGNOSIS — E119 Type 2 diabetes mellitus without complications: Secondary | ICD-10-CM

## 2017-03-17 DIAGNOSIS — R55 Syncope and collapse: Secondary | ICD-10-CM

## 2017-03-17 NOTE — Progress Notes (Signed)
Subjective:    Patient ID: Kathryn Wall, female    DOB: 1946/01/22, 71 y.o.   MRN: [id number]  Medication Refill   Hypertension   Diabetes   09/2016 Patient is here for follow up.  In December, patient was started on Cardura for elevated blood pressure.  She is tolerating the medication well with no side effects. Her blood pressure today is much better controlled at 126/80. She denies any chest pain shortness of breath or dyspnea on exertion. She also however is due for fasting lab work. She has a history of diabetes mellitus type 2. She is long overdue for hemoglobin A1c and a fasting lipid panel. She denies any polyuria, polydipsia, or blurry vision. She denies any myalgias or right upper quadrant pain.  At that time, my plan was: Her blood pressure today is well controlled. I will make no changes in her medication at this time. I will check a hemoglobin A1c. Goal hemoglobin A1c is less than 6.5. I will also check a fasting lipid panel. Goal LDL cholesterol is less than 100. I will also check for diabetic nephropathy with a urine microalbumin  03/17/17 Yesterday, I was attending a funeral which the patient was also attending. There've been a very long service more than an hour and a half followed by a walk to the [employer].  It was extremely hot and humid in direct sunlight. The patient was required to stand for a considerable period of time at the service. At one point, she began to feel lightheaded. Her vision became blurry. She collapsed to the ground and had to hold onto a headstone to keep from falling. She did not completely pass out but became extremely lightheaded. I was standing next to the patient. I immediately evaluated her. Her pulse was rapid and weak. I checked her blood pressure and found to be 86/50. She denied any chest pain or shortness of breath at that moment. She denied any palpitations. She denies any irregular heartbeats. She appeared to have a vasovagal fainting  spell brought on by dehydration and heat. I told the patient not to take her blood pressure medication the following day and to come in for evaluation. Also recommended oral rehydration. She feels 100% better today. Her blood pressure here is elevated however she states that her blood pressure at home has been averaging 100-116/80. She independently discontinue Cardura after April when she experienced orthostatic dizziness. She states that her blood sugars when she checks them are around 130 although she admits she only checks her blood sugars once a month. She denies any polyuria, polydipsia, or blurry vision. Her blood sugar was normal yesterday during the near syncopal episode. Past Medical History:  Diagnosis Date  . Breast cancer ([place]) 1997   AGE 58, BRCA 1 NEGATIVE 2. 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 CKD (chronic kidney disease), stage III   . Diabetes mellitus    TYPE II  . High cholesterol   . Hypertension    Past Surgical History:  Procedure Laterality Date  . ABDOMINAL HYSTERECTOMY  1985   TAH  . BREAST SURGERY  1997   RIGHT BREAST LUMPECTOMY  . CHOLECYSTECTOMY  1980   Current Outpatient Prescriptions on File Prior to Visit  Medication Sig Dispense Refill  . ALPRAZolam (XANAX) 0.5 MG tablet 1 mg at bedtime as needed for sleep.     Marland Kitchen amLODipine (NORVASC) 5 MG tablet TAKE 1 TABLET BY MOUTH ONCE A DAY 90 tablet  3  . atorvastatin (LIPITOR) 40 MG tablet TAKE 1 TABLET BY MOUTH DAILY AT BEDTIME 90 tablet 3  . buPROPion (WELLBUTRIN SR) 200 MG 12 hr tablet Take 1 tablet by mouth daily. Reported on 01/06/2016    . clotrimazole-betamethasone (LOTRISONE) cream Apply 1 application topically 2 (two) times daily. X 2 weeks 45 g 0  . hydrocortisone (PROCTOCORT) 1 % CREA Use 1 application BID PRN. 30 g 1  . JANUMET 50-1000 MG tablet TAKE 1 TABLET BY MOUTH 2 TIMES DAILY WITH A MEAL 180 tablet 3  . Multiple Vitamins-Minerals (MULTIVITAMINS THER. W/MINERALS) TABS Take 1  tablet by mouth daily. Reported on 11/10/2015    . nystatin ointment (MYCOSTATIN) Apply 1 application topically 2 (two) times daily. 30 g 0  . pantoprazole (PROTONIX) 40 MG tablet TAKE 1 TABLET BY MOUTH EVERY MORNING 90 tablet 3  . sitaGLIPtin-metformin (JANUMET) 50-1000 MG tablet TAKE 1 TABLET BY MOUTH TWICE A DAY WITH A MEAL 60 tablet 0  . valsartan (DIOVAN) 160 MG tablet TAKE 1 TABLET BY MOUTH ONCE A DAY 90 tablet 3  . zaleplon (SONATA) 5 MG capsule Take 10 mg by mouth at bedtime as needed for sleep.     No current facility-administered medications on file prior to visit.    Allergies  Allergen Reactions  . Allergen [Antipyrine-Benzocaine]   . Ciprofloxacin     Nausea   . Cymbalta [Duloxetine Hcl]   . Doxazosin   . Other     SENSITIVE TO ANTIBIOTICS   Social History   Social History  . Marital status: Married    Spouse name: N/A  . Number of children: N/A  . Years of education: N/A   Occupational History  . Not on file.   Social History Main Topics  . Smoking status: Never Smoker  . Smokeless tobacco: Never Used  . Alcohol use No  . Drug use: No  . Sexual activity: Yes    Birth control/ protection: Post-menopausal, Surgical     Comment: HYST   Other Topics Concern  . Not on file   Social History Narrative  . No narrative on file   Family History  Problem Relation Age of Onset  . Cancer Mother        COLON  . Hypertension Father   . Heart disease Father       Review of Systems  All other systems reviewed and are negative.      Objective:   Physical Exam  Constitutional: She is oriented to person, place, and time. She appears well-developed and well-nourished. No distress.  HENT:  Head: Normocephalic and atraumatic.  Mouth/Throat: Oropharynx is clear and moist.  Neck: Neck supple. No JVD present.  Cardiovascular: Normal rate, regular rhythm, normal heart sounds and intact distal pulses.  Exam reveals no gallop and no friction rub.   No murmur  heard. Pulmonary/Chest: Effort normal and breath sounds normal. No respiratory distress. She has no wheezes. She has no rales. She exhibits no tenderness.  Abdominal: Soft. Bowel sounds are normal. She exhibits no distension and no mass. There is no tenderness. There is no rebound and no guarding.  Musculoskeletal: She exhibits no edema.  Neurological: She is alert and oriented to person, place, and time. No cranial nerve deficit. She exhibits normal muscle tone.  Skin: Skin is warm. No rash noted. She is not diaphoretic. No erythema. No pallor.  Vitals reviewed.         Assessment & Plan:  Controlled type 2 diabetes  mellitus without complication, without long-term current use of insulin ([place]) - Plan: CBC with Differential/Platelet, COMPLETE METABOLIC PANEL WITH GFR, Hemoglobin A1c  Encounter for immunization - Plan: Flu vaccine HIGH DOSE PF  Benign essential HTN  CKD (chronic kidney disease), stage III  Near syncope  I believe the near syncopal episode yesterday was secondary to hypotension brought on by prolonged standing, vasovagal reaction, overmedication, and heat. I recommended that she discontinue amlodipine indefinitely and check her blood pressures every day and report the values to me in one week. We will need to resume medication if her blood pressures consistently greater than 140/90 however I believe she may have an element of white coat syndrome and artificially have elevated readings in the office. I will also check a hemoglobin A1c. Goal hemoglobin A1c for this patients less than 7. I will also check a CBC to evaluate for anemia and I will monitor her chronic kidney disease with a CMP. Patient received her flu shot today. I do not believe that she had any type of cardiac arrhythmia but was tachycardic secondary to hypertension.

## 2017-03-18 LAB — COMPLETE METABOLIC PANEL WITH GFR
AG RATIO: 1.8 (calc) (ref 1.0–2.5)
ALT: 36 U/L — AB (ref 6–29)
AST: 39 U/L — AB (ref 10–35)
Albumin: 4.5 g/dL (ref 3.6–5.1)
Alkaline phosphatase (APISO): 74 U/L (ref 33–130)
BUN/Creatinine Ratio: 12 (calc) (ref 6–22)
BUN: 14 mg/dL (ref 7–25)
CALCIUM: 9.5 mg/dL (ref 8.6–10.4)
CO2: 22 mmol/L (ref 20–32)
CREATININE: 1.14 mg/dL — AB (ref 0.60–0.93)
Chloride: 102 mmol/L (ref 98–110)
GFR, EST AFRICAN AMERICAN: 55 mL/min/{1.73_m2} — AB (ref >=60)
GFR, EST NON AFRICAN AMERICAN: 48 mL/min/{1.73_m2} — AB (ref >=60)
GLOBULIN: 2.5 g/dL (ref 1.9–3.7)
Glucose, Bld: 114 mg/dL — ABNORMAL HIGH (ref 65–99)
POTASSIUM: 4.7 mmol/L (ref 3.5–5.3)
SODIUM: 134 mmol/L — AB (ref 135–146)
TOTAL PROTEIN: 7 g/dL (ref 6.1–8.1)
Total Bilirubin: 0.5 mg/dL (ref 0.2–1.2)

## 2017-03-18 LAB — CBC WITH DIFFERENTIAL/PLATELET
Basophils Absolute: 87 cells/uL (ref 0–200)
Basophils Relative: 1 %
EOS PCT: 5.4 %
Eosinophils Absolute: 470 cells/uL (ref 15–500)
HEMATOCRIT: 38.5 % (ref 35.0–45.0)
Hemoglobin: 12.7 g/dL (ref 11.7–15.5)
LYMPHS ABS: 2340 {cells}/uL (ref 850–3900)
MCH: 29.6 pg (ref 27.0–33.0)
MCHC: 33 g/dL (ref 32.0–36.0)
MCV: 89.7 fL (ref 80.0–100.0)
MPV: 11.8 fL (ref 7.5–12.5)
Monocytes Relative: 7 %
NEUTROS ABS: 5194 {cells}/uL (ref 1500–7800)
Neutrophils Relative %: 59.7 %
Platelets: 302 10*3/uL (ref 140–400)
RBC: 4.29 10*6/uL (ref 3.80–5.10)
RDW: 12.9 % (ref 11.0–15.0)
Total Lymphocyte: 26.9 %
WBC: 8.7 10*3/uL (ref 3.8–10.8)
WBCMIX: 609 {cells}/uL (ref 200–950)

## 2017-03-18 LAB — HEMOGLOBIN A1C
Hgb A1c MFr Bld: 6.3 % of total Hgb — ABNORMAL HIGH (ref <5.7)
Mean Plasma Glucose: 134 (calc)
eAG (mmol/L): 7.4 (calc)

## 2017-03-30 ENCOUNTER — Telehealth: Payer: Self-pay

## 2017-03-30 NOTE — Telephone Encounter (Signed)
Patient called with her blood pressure readings from 9/21-9/28  9/21 morning 145/93     Evening 156/96  9/22 morning 159/94     Evening 152/94  9/23 morning 149/108   Evening 149/106  9/24 morning 166/112  Evening 151/92  9/25 morning 159/90   Evening 160/106  9/26  morning159/113    Evening 158/108  9/27 morning 149/101  Evening 139/98  9/28 morning 149/113  Evening 149/96  Patient states she  has not been taking amlodipine

## 2017-03-31 NOTE — Telephone Encounter (Signed)
lvmtrc  

## 2017-03-31 NOTE — Telephone Encounter (Signed)
BP is too high, I would resume amlodipine 10 mg a day and recheck in 1 month.

## 2017-04-01 MED ORDER — AMLODIPINE BESYLATE 10 MG PO TABS: 10.0000 mg | ORAL_TABLET | Freq: Every day | ORAL | 0 refills | Status: DC

## 2017-04-01 NOTE — Telephone Encounter (Signed)
Lvmtrc/ also left VM to restart b/p medicine

## 2017-04-04 ENCOUNTER — Encounter: Payer: Self-pay | Admitting: *Deleted

## 2017-04-15 ENCOUNTER — Other Ambulatory Visit: Payer: Self-pay | Admitting: Family Medicine

## 2017-04-29 ENCOUNTER — Other Ambulatory Visit: Payer: Self-pay | Admitting: Family Medicine

## 2017-04-29 MED ORDER — LOSARTAN POTASSIUM 50 MG PO TABS: 50.0000 mg | ORAL_TABLET | Freq: Every day | ORAL | 3 refills | Status: DC

## 2017-11-24 ENCOUNTER — Encounter: Payer: Self-pay | Admitting: Family Medicine

## 2017-11-24 ENCOUNTER — Other Ambulatory Visit: Payer: Self-pay | Admitting: Family Medicine

## 2017-11-24 ENCOUNTER — Ambulatory Visit: Payer: Medicare Other | Admitting: Family Medicine

## 2017-11-24 VITALS — BP 150/100 | HR 94 | Temp 97.7°F | Resp 18 | Wt 170.0 lb

## 2017-11-24 DIAGNOSIS — E118 Type 2 diabetes mellitus with unspecified complications: Secondary | ICD-10-CM | POA: Diagnosis not present

## 2017-11-24 DIAGNOSIS — I10 Essential (primary) hypertension: Secondary | ICD-10-CM

## 2017-11-24 DIAGNOSIS — N183 Chronic kidney disease, stage 3 unspecified: Secondary | ICD-10-CM

## 2017-11-24 DIAGNOSIS — G4700 Insomnia, unspecified: Secondary | ICD-10-CM

## 2017-11-24 MED ORDER — QUETIAPINE FUMARATE 50 MG PO TABS: 50.0000 mg | ORAL_TABLET | Freq: Every day | ORAL | 1 refills | Status: DC

## 2017-11-24 NOTE — Progress Notes (Signed)
Subjective:    Patient ID: Kathryn Wall, female    DOB: [birth year], 72 y.o.   MRN: [id number]  Hypertension   Diabetes   Medication Refill   09/2016 Patient is here for follow up.  In December, patient was started on Cardura for elevated blood pressure.  She is tolerating the medication well with no side effects. Her blood pressure today is much better controlled at 126/80. She denies any chest pain shortness of breath or dyspnea on exertion. She also however is due for fasting lab work. She has a history of diabetes mellitus type 2. She is long overdue for hemoglobin A1c and a fasting lipid panel. She denies any polyuria, polydipsia, or blurry vision. She denies any myalgias or right upper quadrant pain.  At that time, my plan was: Her blood pressure today is well controlled. I will make no changes in her medication at this time. I will check a hemoglobin A1c. Goal hemoglobin A1c is less than 6.5. I will also check a fasting lipid panel. Goal LDL cholesterol is less than 100. I will also check for diabetic nephropathy with a urine microalbumin  03/17/17 Yesterday, I was attending a funeral which the patient was also attending. There've been a very long service more than an hour and a half followed by a walk to the [employer].  It was extremely hot and humid in direct sunlight. The patient was required to stand for a considerable period of time at the service. At one point, she began to feel lightheaded. Her vision became blurry. She collapsed to the ground and had to hold onto a headstone to keep from falling. She did not completely pass out but became extremely lightheaded. I was standing next to the patient. I immediately evaluated her. Her pulse was rapid and weak. I checked her blood pressure and found to be 86/50. She denied any chest pain or shortness of breath at that moment. She denied any palpitations. She denies any irregular heartbeats. She appeared to have a vasovagal fainting  spell brought on by dehydration and heat. I told the patient not to take her blood pressure medication the following day and to come in for evaluation. Also recommended oral rehydration. She feels 100% better today. Her blood pressure here is elevated however she states that her blood pressure at home has been averaging 100-116/80. She independently discontinue Cardura after April when she experienced orthostatic dizziness. She states that her blood sugars when she checks them are around 130 although she admits she only checks her blood sugars once a month. She denies any polyuria, polydipsia, or blurry vision. Her blood sugar was normal yesterday during the near syncopal episode.  At that time, my plan was: I believe the near syncopal episode yesterday was secondary to hypotension brought on by prolonged standing, vasovagal reaction, overmedication, and heat. I recommended that she discontinue amlodipine indefinitely and check her blood pressures every day and report the values to me in one week. We will need to resume medication if her blood pressures consistently greater than 140/90 however I believe she may have an element of white coat syndrome and artificially have elevated readings in the office. I will also check a hemoglobin A1c. Goal hemoglobin A1c for this patients less than 7. I will also check a CBC to evaluate for anemia and I will monitor her chronic kidney disease with a CMP. Patient received her flu shot today. I do not believe that she had any type of cardiac arrhythmia  but was tachycardic secondary to hypertension.  11/24/17 Patient is here today at my request.  Last week, I was seeing her husband for a separate issue.  Patient asked me during his appointment what she could take to help herself sleep.  At that time, she revealed to me that she was taking Lunesta, Sonata, Xanax, and drinking red wine at night to help her sleep and was still going days without sleeping.  These drugs are being  prescribed by her psychiatrist however I do not believe the psychiatrist was aware that she was taking all these medications.  She also stated that she had tried and failed Ambien.  At that point I told her that she needed to stop taking his medications inappropriately due to the risk of overdose and unintentional death.  Also stated that I wanted to see her in an appointment so that we could spend an adequate amount of time to discuss her issue and treat it appropriately.  This upset the patient.  She tells me today that she thought I was being rude.  I apologized to her and stated that it was never my intention to hurt her feelings.  I was not trying to be rude but I was worried that she was going to overdose on medication and that I was unable to devote the full and necessary time to her problem at her husband's appointment that it deserves.  I therefore requested a dedicated visit with her today so that we could discuss her options.  As I have mentioned above, she has been tried on Ambien, Xanax, Lunesta, Sonata, and has taken multiple medications simultaneously unbeknownst to her psychiatrist without the ability to sleep.  Since seeing me last week, she has stopped all of these medications and has gone several days without sleeping.  She is very agitated and upset.  Her blood pressure is also elevated.  Furthermore she is due for lab work to assess the management of her diabetes.  She reports uncontrolled anxiety, racing thoughts that keep her awake.  She constantly thinks during the night about her fears and concerns and anxieties and this keeps her from sleeping.  She is worried about her husband who is suffered multiple strokes. Past Medical History:  Diagnosis Date  . Breast cancer ([place]) 1997   AGE 59, BRCA 1 NEGATIVE 2. 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 CKD (chronic kidney disease), stage III ([place])   . Diabetes mellitus    TYPE II  . High cholesterol   . Hypertension     Past Surgical History:  Procedure Laterality Date  . ABDOMINAL HYSTERECTOMY  1985   TAH  . BREAST SURGERY  1997   RIGHT BREAST LUMPECTOMY  . CHOLECYSTECTOMY  1980   Current Outpatient Medications on File Prior to Visit  Medication Sig Dispense Refill  . ALPRAZolam (XANAX) 0.5 MG tablet 1 mg at bedtime as needed for sleep.     Marland Kitchen amLODipine (NORVASC) 10 MG tablet Take 1 tablet (10 mg total) by mouth daily. 30 tablet 0  . atorvastatin (LIPITOR) 40 MG tablet TAKE 1 TABLET BY MOUTH DAILY AT BEDTIME 90 tablet 3  . buPROPion (WELLBUTRIN SR) 200 MG 12 hr tablet Take 1 tablet by mouth daily. Reported on 01/06/2016    . clotrimazole-betamethasone (LOTRISONE) cream Apply 1 application topically 2 (two) times daily. X 2 weeks 45 g 0  . hydrocortisone (PROCTOCORT) 1 % CREA Use 1 application BID PRN. 30 g 1  .  JANUMET 50-1000 MG tablet TAKE 1 TABLET BY MOUTH 2 TIMES DAILY WITH A MEAL 180 tablet 3  . losartan (COZAAR) 50 MG tablet Take 1 tablet (50 mg total) by mouth daily. 90 tablet 3  . Multiple Vitamins-Minerals (MULTIVITAMINS THER. W/MINERALS) TABS Take 1 tablet by mouth daily. Reported on 11/10/2015    . nystatin ointment (MYCOSTATIN) APPLY TO AFFECTED AREAS TWICE DAILY 30 g 0  . pantoprazole (PROTONIX) 40 MG tablet TAKE 1 TABLET BY MOUTH EVERY MORNING 90 tablet 3  . sitaGLIPtin-metformin (JANUMET) 50-1000 MG tablet TAKE 1 TABLET BY MOUTH TWICE A DAY WITH A MEAL 60 tablet 0   No current facility-administered medications on file prior to visit.    Allergies  Allergen Reactions  . Allergen [Antipyrine-Benzocaine]   . Ciprofloxacin     Nausea   . Cymbalta [Duloxetine Hcl]   . Doxazosin   . Other     SENSITIVE TO ANTIBIOTICS   Social History   Socioeconomic History  . Marital status: Married    Spouse name: Not on file  . Number of children: Not on file  . Years of education: Not on file  . Highest education level: Not on file  Occupational History  . Not on file  Social Needs  .  Financial resource strain: Not on file  . Food insecurity:    Worry: Not on file    Inability: Not on file  . Transportation needs:    Medical: Not on file    Non-medical: Not on file  Tobacco Use  . Smoking status: Never Smoker  . Smokeless tobacco: Never Used  Substance and Sexual Activity  . Alcohol use: No  . Drug use: No  . Sexual activity: Yes    Birth control/protection: Post-menopausal, Surgical    Comment: HYST  Lifestyle  . Physical activity:    Days per week: Not on file    Minutes per session: Not on file  . Stress: Not on file  Relationships  . Social connections:    Talks on phone: Not on file    Gets together: Not on file    Attends religious service: Not on file    Active member of club or organization: Not on file    Attends meetings of clubs or organizations: Not on file    Relationship status: Not on file  . Intimate partner violence:    Fear of current or ex partner: Not on file    Emotionally abused: Not on file    Physically abused: Not on file    Forced sexual activity: Not on file  Other Topics Concern  . Not on file  Social History Narrative  . Not on file   Family History  Problem Relation Age of Onset  . Cancer Mother        COLON  . Hypertension Father   . Heart disease Father       Review of Systems  All other systems reviewed and are negative.      Objective:   Physical Exam  Constitutional: She is oriented to person, place, and time. She appears well-developed and well-nourished. No distress.  Neck: Neck supple.  Cardiovascular: Normal rate, regular rhythm, normal heart sounds and intact distal pulses. Exam reveals no gallop and no friction rub.  No murmur heard. Pulmonary/Chest: Effort normal and breath sounds normal. No respiratory distress. She has no wheezes. She has no rales.  Musculoskeletal: She exhibits no edema.  Neurological: She is alert and oriented to person,  place, and time. No cranial nerve deficit. She  exhibits normal muscle tone.  Skin: She is not diaphoretic.  Psychiatric: Her speech is normal and behavior is normal. Judgment and thought content normal. Her mood appears anxious. Cognition and memory are normal.  Vitals reviewed.         Assessment & Plan:  Insomnia, unspecified type - Plan: QUEtiapine (SEROQUEL) 50 MG tablet  Controlled type 2 diabetes mellitus with complication, without long-term current use of insulin (HCC) - Plan: Hemoglobin A1c, COMPLETE METABOLIC PANEL WITH GFR, Microalbumin, urine, CBC with Differential/Platelet  Benign essential HTN  CKD (chronic kidney disease), stage III ([place]) Again I apologized to the patient if I hurt her feelings or upset her.  It was never my intention to be rude.  However I was acting in her best interest.  I wanted to prevent an unintentional overdose due to polypharmacy and taking medications inappropriately.  If I sounded to harsh it was only because I was trying to be clear and unequivocal that how she was taking her medication was inappropriate.  It was never intended to be rude.  Furthermore I tried to explain that I was unable to devote the adequate amount of time to her problem that it required during her husband's office visit.  I recommend Seroquel 50 mg p.o. nightly for insomnia.  I question possible manic symptoms including insomnia, racing thoughts, impulsive behavior, abuse of prescription medication as well as chronic insomnia that has failed numerous medications.  I believe Seroquel may address both of these issues.  Reassess the patient in 1 week.  If her blood pressure is still elevated after hopefully sleeping better, I would increase her losartan to 100 mg a day.  I will also check hemoglobin A1c, CBC, urine microalbumin, and a CMP today to monitor the management of her chronic medical conditions.

## 2017-11-25 LAB — COMPLETE METABOLIC PANEL WITH GFR
AG RATIO: 1.6 (calc) (ref 1.0–2.5)
ALT: 28 U/L (ref 6–29)
AST: 36 U/L — AB (ref 10–35)
Albumin: 4.5 g/dL (ref 3.6–5.1)
Alkaline phosphatase (APISO): 73 U/L (ref 33–130)
BUN/Creatinine Ratio: 16 (calc) (ref 6–22)
BUN: 17 mg/dL (ref 7–25)
CHLORIDE: 101 mmol/L (ref 98–110)
CO2: 19 mmol/L — ABNORMAL LOW (ref 20–32)
Calcium: 9.9 mg/dL (ref 8.6–10.4)
Creat: 1.05 mg/dL — ABNORMAL HIGH (ref 0.60–0.93)
GFR, EST AFRICAN AMERICAN: 61 mL/min/{1.73_m2} (ref >=60)
GFR, Est Non African American: 53 mL/min/{1.73_m2} — ABNORMAL LOW (ref >=60)
GLOBULIN: 2.8 g/dL (ref 1.9–3.7)
Glucose, Bld: 86 mg/dL (ref 65–99)
POTASSIUM: 5.2 mmol/L (ref 3.5–5.3)
SODIUM: 133 mmol/L — AB (ref 135–146)
Total Bilirubin: 0.6 mg/dL (ref 0.2–1.2)
Total Protein: 7.3 g/dL (ref 6.1–8.1)

## 2017-11-25 LAB — CBC WITH DIFFERENTIAL/PLATELET
BASOS ABS: 90 {cells}/uL (ref 0–200)
Basophils Relative: 0.8 %
Eosinophils Absolute: 403 cells/uL (ref 15–500)
Eosinophils Relative: 3.6 %
HEMATOCRIT: 38 % (ref 35.0–45.0)
Hemoglobin: 13 g/dL (ref 11.7–15.5)
Lymphs Abs: 3438 cells/uL (ref 850–3900)
MCH: 30.3 pg (ref 27.0–33.0)
MCHC: 34.2 g/dL (ref 32.0–36.0)
MCV: 88.6 fL (ref 80.0–100.0)
MPV: 12.4 fL (ref 7.5–12.5)
Monocytes Relative: 7.2 %
Neutro Abs: 6462 cells/uL (ref 1500–7800)
Neutrophils Relative %: 57.7 %
PLATELETS: 334 10*3/uL (ref 140–400)
RBC: 4.29 10*6/uL (ref 3.80–5.10)
RDW: 12.6 % (ref 11.0–15.0)
TOTAL LYMPHOCYTE: 30.7 %
WBC: 11.2 10*3/uL — AB (ref 3.8–10.8)
WBCMIX: 806 {cells}/uL (ref 200–950)

## 2017-11-25 LAB — MICROALBUMIN, URINE: Microalb, Ur: 0.5 mg/dL

## 2017-11-25 LAB — HEMOGLOBIN A1C
EAG (MMOL/L): 8.2 (calc)
HEMOGLOBIN A1C: 6.8 %{Hb} — AB (ref <5.7)
MEAN PLASMA GLUCOSE: 148 (calc)

## 2017-12-02 ENCOUNTER — Other Ambulatory Visit: Payer: Self-pay | Admitting: Family Medicine

## 2017-12-02 ENCOUNTER — Telehealth: Payer: Self-pay | Admitting: Family Medicine

## 2017-12-02 MED ORDER — TRAZODONE HCL 50 MG PO TABS: 50.0000 mg | ORAL_TABLET | Freq: Every evening | ORAL | 3 refills | Status: DC | PRN

## 2017-12-02 NOTE — Telephone Encounter (Signed)
Patient's husband aware of providers recommendations.

## 2017-12-02 NOTE — Telephone Encounter (Signed)
Pt called and states that the sleeping med (Seroquel)  is not helping her at all in fact she has taken 3 at night with not much improvement in her sleep. She would like to know if you would recommend something else.

## 2017-12-02 NOTE — Telephone Encounter (Signed)
Tell her I am sorry it did not work.  I would stop seroquel immediately.  Replace with Trazodone 50 mg poqhs and she can increase to 100 mg poqhs if not working after 2-3 days.  But stop seroquel.

## 2017-12-05 ENCOUNTER — Telehealth: Payer: Self-pay | Admitting: Family Medicine

## 2017-12-05 NOTE — Telephone Encounter (Signed)
Patient called Kathryn Wall stating that the Trazodone does not work for her she has tried it in the past. She is wanting to know if you could prescribe her 200mg  of seroquel as she did get some sleep with 3 of the 50mg  and thinks that 200mg  would work well for her. Ok to fill that?

## 2017-12-06 NOTE — Telephone Encounter (Signed)
I will be willing to increase to 100 mg qhs given the fact she has tried and failed so many other meds (xanax, lunesta, ambien, sonata, trazodone, benadryl,etc).  I would NOT go above 100 mg poqhs as it can cause weight gain and other side effects.  If 100 mg is not enough, I would recommend she discuss this with her psychiatrist and I would not combine ANY other sleep medicine with this.

## 2017-12-06 NOTE — Telephone Encounter (Signed)
Called to speak to pt and she is laying down napping - husband aware of provider recommendations and will relay the message to her. He will have her just take 2 50mg  tabs and will call for refill when this runs out.

## 2017-12-13 ENCOUNTER — Other Ambulatory Visit: Payer: Self-pay | Admitting: *Deleted

## 2017-12-13 NOTE — Telephone Encounter (Signed)
Received call from patient.   Reports that she has been having increased itchiness to vaginal and rectal areas. Requested MD to refill medications that have been sent in the past.   Patient has not been seen by MD since 2017. Will need OV  Call placed to patient and patient made aware per VM.

## 2017-12-16 ENCOUNTER — Other Ambulatory Visit: Payer: Self-pay | Admitting: Family Medicine

## 2018-01-18 ENCOUNTER — Other Ambulatory Visit: Payer: Self-pay | Admitting: Family Medicine

## 2018-01-21 ENCOUNTER — Other Ambulatory Visit: Payer: Self-pay | Admitting: Family Medicine

## 2018-01-21 DIAGNOSIS — G4700 Insomnia, unspecified: Secondary | ICD-10-CM

## 2018-01-25 LAB — HM MAMMOGRAPHY

## 2018-01-30 ENCOUNTER — Encounter: Payer: Self-pay | Admitting: *Deleted

## 2018-02-09 ENCOUNTER — Institutional Professional Consult (permissible substitution): Payer: Medicare Other | Admitting: Neurology

## 2018-03-13 ENCOUNTER — Other Ambulatory Visit: Payer: Self-pay | Admitting: Family Medicine

## 2018-03-13 NOTE — Telephone Encounter (Signed)
Ok to refill??  Last office visit 11/24/2017.  Last refill 01/06/2016?

## 2018-03-28 ENCOUNTER — Other Ambulatory Visit: Payer: Self-pay | Admitting: Family Medicine

## 2018-04-10 ENCOUNTER — Telehealth: Payer: Self-pay | Admitting: *Deleted

## 2018-04-10 NOTE — Telephone Encounter (Signed)
Patient called asking for order for 6 bra's and prosthesis faxed to [facility] [phone], this was done, patient aware

## 2018-04-22 ENCOUNTER — Other Ambulatory Visit: Payer: Self-pay | Admitting: Family Medicine

## 2018-05-22 ENCOUNTER — Other Ambulatory Visit: Payer: Self-pay | Admitting: Family Medicine

## 2018-05-22 DIAGNOSIS — G4700 Insomnia, unspecified: Secondary | ICD-10-CM

## 2018-07-04 ENCOUNTER — Other Ambulatory Visit: Payer: Self-pay | Admitting: Family Medicine

## 2018-07-04 MED ORDER — ALPRAZOLAM 0.5 MG PO TABS: ORAL_TABLET | ORAL | 0 refills | Status: DC

## 2018-07-04 NOTE — Telephone Encounter (Signed)
Due for ov.  

## 2018-07-04 NOTE — Telephone Encounter (Signed)
Pt is requesting refill on Xanax   LOV: 11/24/17  LRF:  03/13/18

## 2018-07-17 ENCOUNTER — Encounter: Payer: Self-pay | Admitting: Family Medicine

## 2018-07-17 NOTE — Telephone Encounter (Signed)
Letter mailed to pt to schedule appt.

## 2018-08-03 ENCOUNTER — Encounter: Payer: Medicare Other | Admitting: Family Medicine

## 2018-08-11 ENCOUNTER — Ambulatory Visit (INDEPENDENT_AMBULATORY_CARE_PROVIDER_SITE_OTHER): Payer: Medicare Other | Admitting: Family Medicine

## 2018-08-11 ENCOUNTER — Encounter: Payer: Self-pay | Admitting: Family Medicine

## 2018-08-11 ENCOUNTER — Other Ambulatory Visit: Payer: Medicare Other

## 2018-08-11 VITALS — BP 128/70 | HR 110 | Temp 97.7°F | Resp 18 | Wt 178.0 lb

## 2018-08-11 DIAGNOSIS — E118 Type 2 diabetes mellitus with unspecified complications: Secondary | ICD-10-CM | POA: Diagnosis not present

## 2018-08-11 DIAGNOSIS — M85852 Other specified disorders of bone density and structure, left thigh: Secondary | ICD-10-CM

## 2018-08-11 DIAGNOSIS — I10 Essential (primary) hypertension: Secondary | ICD-10-CM

## 2018-08-11 DIAGNOSIS — N183 Chronic kidney disease, stage 3 unspecified: Secondary | ICD-10-CM

## 2018-08-11 DIAGNOSIS — G4700 Insomnia, unspecified: Secondary | ICD-10-CM

## 2018-08-11 DIAGNOSIS — R079 Chest pain, unspecified: Secondary | ICD-10-CM | POA: Diagnosis not present

## 2018-08-11 DIAGNOSIS — F419 Anxiety disorder, unspecified: Secondary | ICD-10-CM

## 2018-08-11 DIAGNOSIS — E119 Type 2 diabetes mellitus without complications: Secondary | ICD-10-CM

## 2018-08-11 DIAGNOSIS — Z1211 Encounter for screening for malignant neoplasm of colon: Secondary | ICD-10-CM

## 2018-08-11 DIAGNOSIS — Z79899 Other long term (current) drug therapy: Secondary | ICD-10-CM

## 2018-08-11 DIAGNOSIS — Z0000 Encounter for general adult medical examination without abnormal findings: Secondary | ICD-10-CM

## 2018-08-11 DIAGNOSIS — M85851 Other specified disorders of bone density and structure, right thigh: Secondary | ICD-10-CM

## 2018-08-11 MED ORDER — PAROXETINE HCL 20 MG PO TABS: 20.0000 mg | ORAL_TABLET | Freq: Every day | ORAL | 2 refills | Status: DC

## 2018-08-11 NOTE — Progress Notes (Signed)
Subjective:    Patient ID: Kathryn Wall, female    DOB: 07-Oct-1945, 73 y.o.   MRN: [id number]  Medication Refill   Hypertension   Diabetes   09/2016 Patient is here for follow up.  In December, patient was started on Cardura for elevated blood pressure.  She is tolerating the medication well with no side effects. Her blood pressure today is much better controlled at 126/80. She denies any chest pain shortness of breath or dyspnea on exertion. She also however is due for fasting lab work. She has a history of diabetes mellitus type 2. She is long overdue for hemoglobin A1c and a fasting lipid panel. She denies any polyuria, polydipsia, or blurry vision. She denies any myalgias or right upper quadrant pain.  At that time, my plan was: Her blood pressure today is well controlled. I will make no changes in her medication at this time. I will check a hemoglobin A1c. Goal hemoglobin A1c is less than 6.5. I will also check a fasting lipid panel. Goal LDL cholesterol is less than 100. I will also check for diabetic nephropathy with a urine microalbumin  03/17/17 Yesterday, I was attending a funeral which the patient was also attending. There've been a very long service more than an hour and a half followed by a walk to the [employer].  It was extremely hot and humid in direct sunlight. The patient was required to stand for a considerable period of time at the service. At one point, she began to feel lightheaded. Her vision became blurry. She collapsed to the ground and had to hold onto a headstone to keep from falling. She did not completely pass out but became extremely lightheaded. I was standing next to the patient. I immediately evaluated her. Her pulse was rapid and weak. I checked her blood pressure and found to be 86/50. She denied any chest pain or shortness of breath at that moment. She denied any palpitations. She denies any irregular heartbeats. She appeared to have a vasovagal fainting  spell brought on by dehydration and heat. I told the patient not to take her blood pressure medication the following day and to come in for evaluation. Also recommended oral rehydration. She feels 100% better today. Her blood pressure here is elevated however she states that her blood pressure at home has been averaging 100-116/80. She independently discontinue Cardura after April when she experienced orthostatic dizziness. She states that her blood sugars when she checks them are around 130 although she admits she only checks her blood sugars once a month. She denies any polyuria, polydipsia, or blurry vision. Her blood sugar was normal yesterday during the near syncopal episode.  At that time, my plan was: I believe the near syncopal episode yesterday was secondary to hypotension brought on by prolonged standing, vasovagal reaction, overmedication, and heat. I recommended that she discontinue amlodipine indefinitely and check her blood pressures every day and report the values to me in one week. We will need to resume medication if her blood pressures consistently greater than 140/90 however I believe she may have an element of white coat syndrome and artificially have elevated readings in the office. I will also check a hemoglobin A1c. Goal hemoglobin A1c for this patients less than 7. I will also check a CBC to evaluate for anemia and I will monitor her chronic kidney disease with a CMP. Patient received her flu shot today. I do not believe that she had any type of cardiac arrhythmia  but was tachycardic secondary to hypertension.  11/24/17 Patient is here today at my request.  Last week, I was seeing her husband for a separate issue.  Patient asked me during his appointment what she could take to help herself sleep.  At that time, she revealed to me that she was taking Lunesta, Sonata, Xanax, and drinking red wine at night to help her sleep and was still going days without sleeping.  These drugs are being  prescribed by her psychiatrist however I do not believe the psychiatrist was aware that she was taking all these medications.  She also stated that she had tried and failed Ambien.  At that point I told her that she needed to stop taking his medications inappropriately due to the risk of overdose and unintentional death.  Also stated that I wanted to see her in an appointment so that we could spend an adequate amount of time to discuss her issue and treat it appropriately.  This upset the patient.  She tells me today that she thought I was being rude.  I apologized to her and stated that it was never my intention to hurt her feelings.  I was not trying to be rude but I was worried that she was going to overdose on medication and that I was unable to devote the full and necessary time to her problem at her husband's appointment that it deserves.  I therefore requested a dedicated visit with her today so that we could discuss her options.  As I have mentioned above, she has been tried on Ambien, Xanax, Lunesta, Sonata, and has taken multiple medications simultaneously unbeknownst to her psychiatrist without the ability to sleep.  Since seeing me last week, she has stopped all of these medications and has gone several days without sleeping.  She is very agitated and upset.  Her blood pressure is also elevated.  Furthermore she is due for lab work to assess the management of her diabetes.  She reports uncontrolled anxiety, racing thoughts that keep her awake.  She constantly thinks during the night about her fears and concerns and anxieties and this keeps her from sleeping.  She is worried about her husband who is suffered multiple strokes.  At that time, my plan was: Again I apologized to the patient if I hurt her feelings or upset her.  It was never my intention to be rude.  However I was acting in her best interest.  I wanted to prevent an unintentional overdose due to polypharmacy and taking medications  inappropriately.  If I sounded to harsh it was only because I was trying to be clear and unequivocal that how she was taking her medication was inappropriate.  It was never intended to be rude.  Furthermore I tried to explain that I was unable to devote the adequate amount of time to her problem that it required during her husband's office visit.  I recommend Seroquel 50 mg p.o. nightly for insomnia.  I question possible manic symptoms including insomnia, racing thoughts, impulsive behavior, abuse of prescription medication as well as chronic insomnia that has failed numerous medications.  I believe Seroquel may address both of these issues.  Reassess the patient in 1 week.  If her blood pressure is still elevated after hopefully sleeping better, I would increase her losartan to 100 mg a day.  I will also check hemoglobin A1c, CBC, urine microalbumin, and a CMP today to monitor the management of her chronic medical conditions.  08/11/18 Patient  has not been seen since.  Recently she asked if I would refill her Xanax that she was previously receiving from her psychiatrist.  She is currently taking Xanax 4 times a day.  Therefore I asked the patient to come in for a physical as she is overdue.  She is also overdue to monitor management of her diabetes and chronic medical conditions. Last mammogram was July 2019 was normal.  Last bone density test was in 2018 and was significant for a T score of -1.6 in the hip.  Last colonoscopy was in 2015 and was significant for diverticulosis.  Shot records are listed below: Immunization History  Administered Date(s) Administered  . Influenza Split 05/31/2011  . Influenza, High Dose Seasonal PF 04/30/2013, 03/17/2017  . Influenza,inj,Quad PF,6+ Mos 04/21/2015, 05/25/2016  . Pneumococcal Conjugate-13 08/08/2013, 10/18/2013  . Pneumococcal Polysaccharide-23 05/31/2011   Patient received her flu shot at an outside pharmacy.  She presents today and is extremely anxious.   She still not sleeping.  She states that she is actually taking Xanax 2 tablets at night.  She is also taking Seroquel.  She also takes Benadryl.  She states that nothing has been able to make her sleep in the past.  Her previous psychiatrist had tried her on numerous sleeping medications that had not worked.  She is tachycardic today on her exam.  She seems extremely anxious.  She states that she lays in bed at night ruminating unable to turn off her mind and that this keeps her awake.  She also reports years of chronic abdominal pain despite having normal EGD and normal colonoscopies.  She attributes this to anxiety as the pain strikes in her epigastric area whenever she feels anxious.  She also reports atypical chest pain unrelated to exertion.  EKG today shows normal sinus rhythm with normal intervals and a normal axis with no evidence of ischemia or infarction.  The chest pain is in the center to the right.  There is no relationship to exertion.  It occurs at random times.  It improves with Tums.  It also improves with a deep breath.  She denies any cough or hemoptysis.  She is also requesting pain medication for chronic back pain.  She gets these from her back Psychologist, sport and exercise.  She is asking if I can do this so that she does not have to go up there to save money.  I explained to the patient that I would not prescribe long-term narcotics to someone who is taking Xanax.  I believe anxiety is playing a large role in the patient's numerous somatic complaints Past Medical History:  Diagnosis Date  . Breast cancer ([place]) 1997   AGE 70, BRCA 1 NEGATIVE 2. 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 CKD (chronic kidney disease), stage III ([place])   . Diabetes mellitus    TYPE II  . High cholesterol   . Hypertension    Past Surgical History:  Procedure Laterality Date  . ABDOMINAL HYSTERECTOMY  1985   TAH  . BREAST SURGERY  1997   RIGHT BREAST LUMPECTOMY  . CHOLECYSTECTOMY  1980   Current  Outpatient Medications on File Prior to Visit  Medication Sig Dispense Refill  . ALPRAZolam (XANAX) 0.5 MG tablet TAKE 1 TABLET BY MOUTH 4 TIMES DAILY AS NEEDED 120 tablet 0  . amLODipine (NORVASC) 10 MG tablet Take 1 tablet (10 mg total) by mouth daily. 30 tablet 0  . amLODipine (NORVASC) 5 MG tablet TAKE 1  TABLET BY MOUTH ONCE A DAY 90 tablet 3  . atorvastatin (LIPITOR) 40 MG tablet TAKE 1 TABLET BY MOUTH EVERY NIGHT AT BEDTIME 90 tablet 3  . buPROPion (WELLBUTRIN SR) 200 MG 12 hr tablet Take 1 tablet by mouth daily. Reported on 01/06/2016    . clotrimazole-betamethasone (LOTRISONE) cream Apply 1 application topically 2 (two) times daily. X 2 weeks 45 g 0  . hydrocortisone (PROCTOCORT) 1 % CREA Use 1 application BID PRN. 30 g 1  . JANUMET 50-1000 MG tablet TAKE 1 TABLET BY MOUTH TWICE A DAY WITH A MEAL. 180 tablet 3  . losartan (COZAAR) 50 MG tablet TAKE 1 TABLET BY MOUTH DAILY. 90 tablet 3  . Multiple Vitamins-Minerals (MULTIVITAMINS THER. W/MINERALS) TABS Take 1 tablet by mouth daily. Reported on 11/10/2015    . nystatin ointment (MYCOSTATIN) APPLY TO AFFECTED AREAS TWICE DAILY 30 g 0  . pantoprazole (PROTONIX) 40 MG tablet TAKE ONE TABLET BY MOUTH EVERY MORNING 90 tablet 3  . QUEtiapine (SEROQUEL) 50 MG tablet TAKE 2 TABLETS BY MOUTH AT BEDTIME 60 tablet 1  . sitaGLIPtin-metformin (JANUMET) 50-1000 MG tablet TAKE 1 TABLET BY MOUTH TWICE A DAY WITH A MEAL 60 tablet 0  . traZODone (DESYREL) 50 MG tablet Take 1 tablet (50 mg total) by mouth at bedtime as needed for sleep. 30 tablet 3   No current facility-administered medications on file prior to visit.    Allergies  Allergen Reactions  . Allergen [Antipyrine-Benzocaine]   . Ciprofloxacin     Nausea   . Cymbalta [Duloxetine Hcl]   . Doxazosin   . Other     SENSITIVE TO ANTIBIOTICS   Social History   Socioeconomic History  . Marital status: Married    Spouse name: Not on file  . Number of children: Not on file  . Years of  education: Not on file  . Highest education level: Not on file  Occupational History  . Not on file  Social Needs  . Financial resource strain: Not on file  . Food insecurity:    Worry: Not on file    Inability: Not on file  . Transportation needs:    Medical: Not on file    Non-medical: Not on file  Tobacco Use  . Smoking status: Never Smoker  . Smokeless tobacco: Never Used  Substance and Sexual Activity  . Alcohol use: No  . Drug use: No  . Sexual activity: Yes    Birth control/protection: Post-menopausal, Surgical    Comment: HYST  Lifestyle  . Physical activity:    Days per week: Not on file    Minutes per session: Not on file  . Stress: Not on file  Relationships  . Social connections:    Talks on phone: Not on file    Gets together: Not on file    Attends religious service: Not on file    Active member of club or organization: Not on file    Attends meetings of clubs or organizations: Not on file    Relationship status: Not on file  . Intimate partner violence:    Fear of current or ex partner: Not on file    Emotionally abused: Not on file    Physically abused: Not on file    Forced sexual activity: Not on file  Other Topics Concern  . Not on file  Social History Narrative  . Not on file   Family History  Problem Relation Age of Onset  . Cancer Mother  COLON  . Hypertension Father   . Heart disease Father       Review of Systems  All other systems reviewed and are negative.      Objective:   Physical Exam  Constitutional: She is oriented to person, place, and time. She appears well-developed and well-nourished. No distress.  Neck: Neck supple.  Cardiovascular: Normal rate, regular rhythm, normal heart sounds and intact distal pulses. Exam reveals no gallop and no friction rub.  No murmur heard. Pulmonary/Chest: Effort normal and breath sounds normal. No respiratory distress. She has no wheezes. She has no rales.  Musculoskeletal:         General: No edema.  Neurological: She is alert and oriented to person, place, and time. No cranial nerve deficit. She exhibits normal muscle tone.  Skin: She is not diaphoretic.  Psychiatric: Her speech is normal and behavior is normal. Judgment and thought content normal. Her mood appears anxious. Cognition and memory are normal.  Vitals reviewed.         Assessment & Plan:  Colon cancer screening - Plan: Ambulatory referral to Gastroenterology  Chest pain, unspecified type - Plan: EKG 12-Lead  Controlled type 2 diabetes mellitus with complication, without long-term current use of insulin (HCC)  Benign essential HTN  Insomnia, unspecified type  CKD (chronic kidney disease), stage III (HCC)  Osteopenia of femoral neck, bilateral  Anxiety  General medical exam  I believe the patient's insomnia, atypical chest pain, abnormal abdominal pain that is ongoing for years is most likely were due to underlying anxiety that is poorly controlled.  I have recommended trying Paxil 20 mg a day and reassessing in 6 weeks to see if it is beneficial.  I also warned the patient against polypharmacy and mixing numerous medications together at night due to the risk of falls, sedation, and unintended injury.  I would not prescribe long-term narcotics to someone taking Xanax.  Therefore I declined to do this.  I would like to gradually try to wean the patient away from the Seroquel and the Xanax at night however I feel that we need to control her anxiety better first that is why want to start the Paxil.  I also recommended meditation to help with anxiety and insomnia.  I will check fasting lab work including a CBC, CMP, fasting lipid panel, hemoglobin A1c to monitor the management of her diabetes and hyperlipidemia.  Blood pressure today is reasonable.  Diabetic foot exam is normal except for some diminished sensation to 10 g monofilament in her left foot.  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  I recommended an annual diabetic eye exam.  Patient will schedule her own mammogram this summer in addition to her own bone density.

## 2018-08-12 LAB — COMPREHENSIVE METABOLIC PANEL
AG RATIO: 1.6 (calc) (ref 1.0–2.5)
ALBUMIN MSPROF: 4.2 g/dL (ref 3.6–5.1)
ALT: 24 U/L (ref 6–29)
AST: 24 U/L (ref 10–35)
Alkaline phosphatase (APISO): 68 U/L (ref 37–153)
BUN / CREAT RATIO: 11 (calc) (ref 6–22)
BUN: 12 mg/dL (ref 7–25)
CHLORIDE: 105 mmol/L (ref 98–110)
CO2: 23 mmol/L (ref 20–32)
CREATININE: 1.09 mg/dL — AB (ref 0.60–0.93)
Calcium: 9.4 mg/dL (ref 8.6–10.4)
GLOBULIN: 2.6 g/dL (ref 1.9–3.7)
GLUCOSE: 169 mg/dL — AB (ref 65–99)
Potassium: 5.3 mmol/L (ref 3.5–5.3)
SODIUM: 140 mmol/L (ref 135–146)
TOTAL PROTEIN: 6.8 g/dL (ref 6.1–8.1)
Total Bilirubin: 0.4 mg/dL (ref 0.2–1.2)

## 2018-08-12 LAB — LIPID PANEL
Cholesterol: 141 mg/dL (ref <200)
HDL: 40 mg/dL — ABNORMAL LOW (ref >=50)
LDL Cholesterol (Calc): 67 mg/dL (calc)
Non-HDL Cholesterol (Calc): 101 mg/dL (calc) (ref <130)
Total CHOL/HDL Ratio: 3.5 (calc) (ref <5.0)
Triglycerides: 257 mg/dL — ABNORMAL HIGH (ref <150)

## 2018-08-12 LAB — HEMOGLOBIN A1C
Hgb A1c MFr Bld: 8 % of total Hgb — ABNORMAL HIGH (ref <5.7)
MEAN PLASMA GLUCOSE: 183 (calc)
eAG (mmol/L): 10.1 (calc)

## 2018-08-12 LAB — CBC WITH DIFFERENTIAL/PLATELET
Absolute Monocytes: 466 cells/uL (ref 200–950)
Basophils Absolute: 79 cells/uL (ref 0–200)
Basophils Relative: 1 %
EOS PCT: 4.7 %
Eosinophils Absolute: 371 cells/uL (ref 15–500)
HCT: 37.2 % (ref 35.0–45.0)
Hemoglobin: 12.3 g/dL (ref 11.7–15.5)
LYMPHS ABS: 3381 {cells}/uL (ref 850–3900)
MCH: 29.8 pg (ref 27.0–33.0)
MCHC: 33.1 g/dL (ref 32.0–36.0)
MCV: 90.1 fL (ref 80.0–100.0)
MPV: 12.6 fL — AB (ref 7.5–12.5)
Monocytes Relative: 5.9 %
Neutro Abs: 3602 cells/uL (ref 1500–7800)
Neutrophils Relative %: 45.6 %
PLATELETS: 291 10*3/uL (ref 140–400)
RBC: 4.13 10*6/uL (ref 3.80–5.10)
RDW: 13.2 % (ref 11.0–15.0)
Total Lymphocyte: 42.8 %
WBC: 7.9 10*3/uL (ref 3.8–10.8)

## 2018-08-15 ENCOUNTER — Other Ambulatory Visit: Payer: Self-pay | Admitting: Family Medicine

## 2018-08-15 DIAGNOSIS — N183 Chronic kidney disease, stage 3 unspecified: Secondary | ICD-10-CM

## 2018-08-15 DIAGNOSIS — E118 Type 2 diabetes mellitus with unspecified complications: Secondary | ICD-10-CM

## 2018-08-15 DIAGNOSIS — I10 Essential (primary) hypertension: Secondary | ICD-10-CM

## 2018-08-15 MED ORDER — EMPAGLIFLOZIN 25 MG PO TABS: 25.0000 mg | ORAL_TABLET | Freq: Every day | ORAL | 0 refills | Status: DC

## 2018-08-21 ENCOUNTER — Other Ambulatory Visit: Payer: Self-pay | Admitting: Family Medicine

## 2018-08-21 DIAGNOSIS — G4700 Insomnia, unspecified: Secondary | ICD-10-CM

## 2018-08-28 ENCOUNTER — Other Ambulatory Visit: Payer: Self-pay | Admitting: Family Medicine

## 2018-08-28 NOTE — Telephone Encounter (Signed)
Pt is requesting refill on Xanax   LOV:08/11/18  LRF:   07/04/18

## 2018-09-25 ENCOUNTER — Ambulatory Visit: Payer: Medicare Other | Admitting: Family Medicine

## 2018-09-30 ENCOUNTER — Other Ambulatory Visit: Payer: Self-pay | Admitting: Family Medicine

## 2018-10-02 NOTE — Telephone Encounter (Signed)
Pt is requesting refill on Xanax   LOV: 08/11/18  LRF:  08/28/18

## 2018-10-17 ENCOUNTER — Other Ambulatory Visit: Payer: Self-pay | Admitting: Family Medicine

## 2018-10-17 DIAGNOSIS — G4700 Insomnia, unspecified: Secondary | ICD-10-CM

## 2018-11-02 ENCOUNTER — Other Ambulatory Visit: Payer: Self-pay | Admitting: Family Medicine

## 2018-11-03 ENCOUNTER — Other Ambulatory Visit: Payer: Self-pay | Admitting: Family Medicine

## 2018-11-03 NOTE — Telephone Encounter (Signed)
Ok to refill??  Last office visit 08/11/2018.  Last refill 10/02/2018.

## 2018-11-06 ENCOUNTER — Telehealth: Payer: Self-pay | Admitting: Family Medicine

## 2018-11-06 NOTE — Telephone Encounter (Signed)
Patient's husband called in stating that patient has tingling and pain in the right hand. Onset of symptoms several weeks ago. No other symptoms and no injury. Scheduled patient for an office visit for tomorrow.

## 2018-11-07 ENCOUNTER — Other Ambulatory Visit: Payer: Self-pay

## 2018-11-07 ENCOUNTER — Encounter: Payer: Self-pay | Admitting: Family Medicine

## 2018-11-07 ENCOUNTER — Ambulatory Visit: Payer: Medicare Other | Admitting: Family Medicine

## 2018-11-07 VITALS — BP 136/80 | HR 100 | Temp 97.9°F | Resp 18 | Wt 175.0 lb

## 2018-11-07 DIAGNOSIS — G5601 Carpal tunnel syndrome, right upper limb: Secondary | ICD-10-CM | POA: Diagnosis not present

## 2018-11-07 MED ORDER — HYDROCODONE-ACETAMINOPHEN 5-325 MG PO TABS: 1.0000 | ORAL_TABLET | Freq: Four times a day (QID) | ORAL | 0 refills | Status: DC | PRN

## 2018-11-07 NOTE — Progress Notes (Signed)
Subjective:    Patient ID: Kathryn Wall, female    DOB: 07/20/1945, 73 y.o.   MRN: [id number]  HPI  Patient reports increasing pain in her right hand from her wrist all the way down to her fingertips.  She reports a constant aching pain in the volar surface of her right wrist.  The pain is the worst that radiates down into her right thumb.  She has wasting of the thenar eminence.  She also has burning stinging pain radiating into her first second third and fourth fingers.  She denies pain in her fifth digit.  She reports decreasing grip strength.  The pain is constant but worse at night.  It keeps her from sleeping.  She has been taking Tylenol with no relief.  She also reports numbness in her hand distal to the wrist.  She has a positive Tinel sign today.  She has a positive Phalen sign.  There is no erythema or swelling in the hand.  There is no tenderness to palpation over the metacarpals, carpals, or phalanges.     Past Medical History:  Diagnosis Date  . Breast cancer ([place]) 1997   AGE 1, BRCA 1 NEGATIVE 2. 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 CKD (chronic kidney disease), stage III ([place])   . Diabetes mellitus    TYPE II  . High cholesterol   . Hypertension    Past Surgical History:  Procedure Laterality Date  . ABDOMINAL HYSTERECTOMY  1985   TAH  . BREAST SURGERY  1997   RIGHT BREAST LUMPECTOMY  . CHOLECYSTECTOMY  1980   Current Outpatient Medications on File Prior to Visit  Medication Sig Dispense Refill  . ALPRAZolam (XANAX) 0.5 MG tablet TAKE 1 TABLET BY MOUTH 4 TIMES DAILY AS NEEDED 120 tablet 0  . amLODipine (NORVASC) 10 MG tablet Take 1 tablet (10 mg total) by mouth daily. 30 tablet 0  . atorvastatin (LIPITOR) 40 MG tablet TAKE 1 TABLET BY MOUTH EVERY NIGHT AT BEDTIME 90 tablet 3  . empagliflozin (JARDIANCE) 25 MG TABS tablet Take 25 mg by mouth daily. 90 tablet 0  . hydrocortisone (PROCTOCORT) 1 % CREA Use 1 application BID PRN. 30 g 1  .  JANUMET 50-1000 MG tablet TAKE 1 TABLET BY MOUTH TWICE A DAY WITH A MEAL. 180 tablet 3  . losartan (COZAAR) 50 MG tablet TAKE 1 TABLET BY MOUTH DAILY. 90 tablet 3  . Multiple Vitamins-Minerals (MULTIVITAMINS THER. W/MINERALS) TABS Take 1 tablet by mouth daily. Reported on 11/10/2015    . nystatin ointment (MYCOSTATIN) APPLY TO AFFECTED AREAS TWICE DAILY 30 g 0  . pantoprazole (PROTONIX) 40 MG tablet TAKE ONE TABLET BY MOUTH EVERY MORNING 90 tablet 3  . PARoxetine (PAXIL) 20 MG tablet TAKE 1 TABLET BY MOUTH ONCE DAILY 30 tablet 2  . QUEtiapine (SEROQUEL) 50 MG tablet TAKE 2 TABLETS BY MOUTH AT BEDTIME 60 tablet 1  . sitaGLIPtin-metformin (JANUMET) 50-1000 MG tablet TAKE 1 TABLET BY MOUTH TWICE A DAY WITH A MEAL 60 tablet 0   No current facility-administered medications on file prior to visit.    Allergies  Allergen Reactions  . Allergen [Antipyrine-Benzocaine]   . Ciprofloxacin     Nausea   . Cymbalta [Duloxetine Hcl]   . Doxazosin   . Other     SENSITIVE TO ANTIBIOTICS   Social History   Socioeconomic History  . Marital status: Married    Spouse name: Not on file  .  Number of children: Not on file  . Years of education: Not on file  . Highest education level: Not on file  Occupational History  . Not on file  Social Needs  . Financial resource strain: Not on file  . Food insecurity:    Worry: Not on file    Inability: Not on file  . Transportation needs:    Medical: Not on file    Non-medical: Not on file  Tobacco Use  . Smoking status: Never Smoker  . Smokeless tobacco: Never Used  Substance and Sexual Activity  . Alcohol use: No  . Drug use: No  . Sexual activity: Yes    Birth control/protection: Post-menopausal, Surgical    Comment: HYST  Lifestyle  . Physical activity:    Days per week: Not on file    Minutes per session: Not on file  . Stress: Not on file  Relationships  . Social connections:    Talks on phone: Not on file    Gets together: Not on file     Attends religious service: Not on file    Active member of club or organization: Not on file    Attends meetings of clubs or organizations: Not on file    Relationship status: Not on file  . Intimate partner violence:    Fear of current or ex partner: Not on file    Emotionally abused: Not on file    Physically abused: Not on file    Forced sexual activity: Not on file  Other Topics Concern  . Not on file  Social History Narrative  . Not on file     Review of Systems  All other systems reviewed and are negative.      Objective:   Physical Exam Vitals signs reviewed.  Constitutional:      Appearance: Normal appearance.  Cardiovascular:     Rate and Rhythm: Normal rate and regular rhythm.     Heart sounds: Normal heart sounds. No murmur.  Pulmonary:     Effort: Pulmonary effort is normal. No respiratory distress.     Breath sounds: Normal breath sounds. No wheezing or rales.  Musculoskeletal:     Right wrist: She exhibits tenderness. She exhibits normal range of motion, no bony tenderness, no swelling, no effusion, no crepitus and no deformity.  Neurological:     Mental Status: She is alert.           Assessment & Plan:  Carpal tunnel syndrome of right wrist  Patient's history and exam are consistent with carpal tunnel syndrome in the right wrist.  After discussing treatment options, the patient elects to receive a cortisone injection.  Using sterile technique, I injected 1/2 cc of 40 mg/mL Kenalog and 1/2 cc of 0.1% lidocaine into the carpal tunnel by injecting at the distal flexor crease just adjacent to the palmaris longus tendon.  I aspirated prior to injection to ensure that I was not in a blood vessel.  Patient tolerated the injection well with no complications.  Shortly thereafter she reported numbness in her right hand consistent with a lidocaine being instilled adjacent to the median nerve.  Recommended she allow the injection to take effect over the next 24 to 48  hours to see if her pain will improve.  I did give her a one-time prescription for Norco in case the pain worsens.

## 2018-11-17 ENCOUNTER — Other Ambulatory Visit: Payer: Self-pay | Admitting: Family Medicine

## 2018-11-17 DIAGNOSIS — G4700 Insomnia, unspecified: Secondary | ICD-10-CM

## 2018-11-28 ENCOUNTER — Other Ambulatory Visit: Payer: Self-pay | Admitting: Family Medicine

## 2018-11-29 NOTE — Telephone Encounter (Signed)
Pt is requesting refill on Xanax   LOV: 11/07/18  LRF:   11/03/18

## 2018-12-01 ENCOUNTER — Other Ambulatory Visit: Payer: Self-pay | Admitting: Family Medicine

## 2018-12-13 ENCOUNTER — Other Ambulatory Visit: Payer: Self-pay | Admitting: Family Medicine

## 2019-01-01 ENCOUNTER — Other Ambulatory Visit: Payer: Self-pay | Admitting: Family Medicine

## 2019-01-01 NOTE — Telephone Encounter (Signed)
Ok to refill??  Last office visit 11/07/2018.  Last refill 11/29/2018.

## 2019-01-13 ENCOUNTER — Other Ambulatory Visit: Payer: Self-pay | Admitting: Family Medicine

## 2019-01-29 ENCOUNTER — Other Ambulatory Visit: Payer: Self-pay | Admitting: Family Medicine

## 2019-02-03 ENCOUNTER — Other Ambulatory Visit: Payer: Self-pay | Admitting: Family Medicine

## 2019-02-05 NOTE — Telephone Encounter (Signed)
Pt is requesting refill on Xanax   LOV: 11/07/18  LRF:   01/01/19

## 2019-02-19 ENCOUNTER — Other Ambulatory Visit: Payer: Self-pay | Admitting: Family Medicine

## 2019-02-19 DIAGNOSIS — G4700 Insomnia, unspecified: Secondary | ICD-10-CM

## 2019-02-24 ENCOUNTER — Encounter: Payer: Self-pay | Admitting: Emergency Medicine

## 2019-02-24 ENCOUNTER — Emergency Department
Admission: EM | Admit: 2019-02-24 | Discharge: 2019-02-25 | Disposition: A | Discharge status: Home / Self Care | Payer: Medicare Other | Attending: Emergency Medicine | Admitting: Emergency Medicine

## 2019-02-24 ENCOUNTER — Emergency Department: Payer: Medicare Other

## 2019-02-24 ENCOUNTER — Other Ambulatory Visit: Payer: Self-pay

## 2019-02-24 DIAGNOSIS — N183 Chronic kidney disease, stage 3 (moderate): Secondary | ICD-10-CM | POA: Insufficient documentation

## 2019-02-24 DIAGNOSIS — Y999 Unspecified external cause status: Secondary | ICD-10-CM | POA: Diagnosis not present

## 2019-02-24 DIAGNOSIS — Z7984 Long term (current) use of oral hypoglycemic drugs: Secondary | ICD-10-CM | POA: Insufficient documentation

## 2019-02-24 DIAGNOSIS — Y9302 Activity, running: Secondary | ICD-10-CM | POA: Insufficient documentation

## 2019-02-24 DIAGNOSIS — E1122 Type 2 diabetes mellitus with diabetic chronic kidney disease: Secondary | ICD-10-CM | POA: Diagnosis not present

## 2019-02-24 DIAGNOSIS — Y929 Unspecified place or not applicable: Secondary | ICD-10-CM | POA: Diagnosis not present

## 2019-02-24 DIAGNOSIS — Z79899 Other long term (current) drug therapy: Secondary | ICD-10-CM | POA: Insufficient documentation

## 2019-02-24 DIAGNOSIS — S8992XA Unspecified injury of left lower leg, initial encounter: Secondary | ICD-10-CM | POA: Diagnosis present

## 2019-02-24 DIAGNOSIS — W010XXA Fall on same level from slipping, tripping and stumbling without subsequent striking against object, initial encounter: Secondary | ICD-10-CM | POA: Insufficient documentation

## 2019-02-24 DIAGNOSIS — W19XXXA Unspecified fall, initial encounter: Secondary | ICD-10-CM

## 2019-02-24 DIAGNOSIS — I129 Hypertensive chronic kidney disease with stage 1 through stage 4 chronic kidney disease, or unspecified chronic kidney disease: Secondary | ICD-10-CM | POA: Insufficient documentation

## 2019-02-24 DIAGNOSIS — S82842A Displaced bimalleolar fracture of left lower leg, initial encounter for closed fracture: Secondary | ICD-10-CM | POA: Diagnosis not present

## 2019-02-24 NOTE — ED Triage Notes (Signed)
Pt says she and her husband had just returned home and were going to get out of the car; says her husband had to go void and said he'd come back for her; she said she's had some diarrhea and couldn't wait for him any longer; says she got out of the car and squatted down to poop in the yard when she fell; obvious deformity to left ankle; pt awake and alert; talking in complete coherent sentences;

## 2019-02-24 NOTE — ED Provider Notes (Signed)
[facility] Emergency Department Provider Note   ____________________________________________   First MD Initiated Contact with Patient 02/24/19 2320     (approximate)  I have reviewed the triage vital signs and the nursing notes.   HISTORY  Chief Complaint Fall and Ankle Pain    HPI Kathryn Wall is a 73 y.o. female who presents to the ED from home status post mechanical fall.  Patient had just gotten out of the car, had to rush to the restroom to have diarrhea, could not make it and squatted down to pick up in the yard when she fell, rolling her left ankle.  Denies striking head or LOC.  Denies anticoagulant use.  Presents to the ED for pain, swelling and obvious deformity to her left ankle.  Voices no other complaints or injuries.       Past Medical History:  Diagnosis Date  . Breast cancer ([place]) 1997   AGE 1, BRCA 1 NEGATIVE 2. 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 CKD (chronic kidney disease), stage III ([place])   . Diabetes mellitus    TYPE II  . High cholesterol   . Hypertension     Patient Active Problem List   Diagnosis Date Noted  . Osteopenia of femoral neck, bilateral 10/15/2016  . Breast cancer of lower-outer quadrant of right female breast ([place]) 04/02/2014  . CKD (chronic kidney disease), stage III ([place])   . Essential hypertension, benign 08/14/2012  . Hypercholesteremia 08/14/2012  . Diabetes ([place]) 08/14/2012  . Pyelonephritis 05/29/2011  . Constipation 05/29/2011  . AKI (acute kidney injury) ([place]) 05/29/2011    Past Surgical History:  Procedure Laterality Date  . ABDOMINAL HYSTERECTOMY  1985   TAH  . BREAST SURGERY  1997   RIGHT BREAST LUMPECTOMY  . CHOLECYSTECTOMY  1980    Prior to Admission medications   Medication Sig Start Date End Date Taking? Authorizing Provider  ALPRAZolam Duanne Moron) 0.5 MG tablet TAKE 1 TABLET BY MOUTH 4 TIMES DAILY AS NEEDED 02/05/19   [name], MD  amLODipine  (NORVASC) 10 MG tablet Take 1 tablet (10 mg total) by mouth daily. 04/01/17   [name], MD  amLODipine (NORVASC) 5 MG tablet TAKE 1 TABLET BY MOUTH ONCE DAILY 12/13/18   [name], MD  atorvastatin (LIPITOR) 40 MG tablet TAKE 1 TABLET BY MOUTH AT BEDTIME 01/15/19   [name], MD  empagliflozin (JARDIANCE) 25 MG TABS tablet Take 25 mg by mouth daily. 08/15/18   [name], MD  HYDROcodone-acetaminophen (NORCO) 5-325 MG tablet Take 1 tablet by mouth every 6 (six) hours as needed for moderate pain. 02/25/19   Paulette Blanch, MD  hydrocortisone (PROCTOCORT) 1 % CREA Use 1 application BID PRN. 09/25/15   [name], MD  JANUMET 50-1000 MG tablet TAKE 1 TABLET BY MOUTH TWICE A DAY WTIH A MEAL. 01/30/19   [name], MD  losartan (COZAAR) 50 MG tablet TAKE 1 TABLET BY MOUTH DAILY 02/05/19   [name], MD  Multiple Vitamins-Minerals (MULTIVITAMINS THER. W/MINERALS) TABS Take 1 tablet by mouth daily. Reported on 11/10/2015    [provider]  nystatin ointment (MYCOSTATIN) APPLY TO AFFECTED AREAS TWICE DAILY 01/01/19   [name], MD  pantoprazole (PROTONIX) 40 MG tablet TAKE ONE TABLET BY MOUTH EVERY MORNING 03/28/18   [name], MD  PARoxetine (PAXIL) 20 MG tablet TAKE 1 TABLET BY MOUTH ONCE DAILY 01/30/19  [name], MD  QUEtiapine (SEROQUEL) 50 MG tablet TAKE 2 TABLETS BY MOUTH EVERY NIGHT AT BEDTIME 02/19/19   [name], MD  sitaGLIPtin-metformin (JANUMET) 50-1000 MG tablet TAKE 1 TABLET BY MOUTH TWICE A DAY WITH A MEAL 06/08/16   [name], MD    Allergies Allergen [antipyrine-benzocaine], Ciprofloxacin, Cymbalta [duloxetine hcl], Doxazosin, Other, and Percocet [oxycodone-acetaminophen]  Family History  Problem Relation Age of Onset  . Cancer Mother        COLON  . Hypertension Father   . Heart disease Father     Social History Social History   Tobacco Use  . Smoking status: Never Smoker  . Smokeless  tobacco: Never Used  Substance Use Topics  . Alcohol use: No  . Drug use: No    Review of Systems  Constitutional: No fever/chills Eyes: No visual changes. ENT: No sore throat. Cardiovascular: Denies chest pain. Respiratory: Denies shortness of breath. Gastrointestinal: No abdominal pain.  No nausea, no vomiting.  No diarrhea.  No constipation. Genitourinary: Negative for dysuria. Musculoskeletal: Positive for left ankle pain and swelling.  Negative for back pain. Skin: Negative for rash. Neurological: Negative for headaches, focal weakness or numbness.   ____________________________________________   PHYSICAL EXAM:  VITAL SIGNS: ED Triage Vitals  Enc Vitals Group     BP 02/24/19 2241 (!) 177/99     Pulse Rate 02/24/19 2241 (!) 109     Resp 02/24/19 2241 18     Temp 02/24/19 2241 97.8 F (36.6 C)     Temp Source 02/24/19 2241 Oral     SpO2 02/24/19 2241 100 %     Weight 02/24/19 2242 170 lb (77.1 kg)     Height 02/24/19 2242 5' 5" (1.651 m)     Head Circumference --      Peak Flow --      Pain Score 02/24/19 2242 9     Pain Loc --      Pain Edu? --      Excl. in [place]? --     Constitutional: Alert and oriented. Well appearing and in mild acute distress. Eyes: Conjunctivae are normal. PERRL. EOMI. Head: Atraumatic. Nose: Atraumatic. Mouth/Throat: Mucous membranes are moist.  No dental malocclusion. Neck: No stridor.  No cervical spine tenderness to palpation. Cardiovascular: Normal rate, regular rhythm. Grossly normal heart sounds.  Good peripheral circulation. Respiratory: Normal respiratory effort.  No retractions. Lungs CTAB. Gastrointestinal: Soft and nontender. No distention. No abdominal bruits. No CVA tenderness. Musculoskeletal: Left ankle with obvious deformity.  2+ distal pulses.  Brisk, less than 5-second capillary refill.. Neurologic:  Normal speech and language. No gross focal neurologic deficits are appreciated.  Skin:  Skin is warm, dry and intact.  No rash noted. Psychiatric: Mood and affect are normal. Speech and behavior are normal.  ____________________________________________   LABS (all labs ordered are listed, but only abnormal results are displayed)  Labs Reviewed - No data to display ____________________________________________  EKG  None ____________________________________________  RADIOLOGY  ED MD interpretation: Bimalleolar fracture  Official radiology report(s): Dg Ankle Complete Left  Result Date: 02/24/2019 CLINICAL DATA:  73 year old female with fall and trauma to the left ankle. EXAM: LEFT ANKLE COMPLETE - 3+ VIEW COMPARISON:  Left foot radiograph dated 03/10/2015 FINDINGS: Mildly displaced and angulated fracture of the distal fibula with lateral displacement and angulation of the distal fracture fragment. There is a displaced fracture of the medial malleolus with approximately 5 mm lateral displacement of the distal fracture fragment. The  bones are osteopenic. There is slight lateral subluxation of the ankle mortise. There is diffuse soft tissue edema. IMPRESSION: Displaced fractures of the distal fibula and medial malleolus and mild lateral subluxation of the ankle mortise as described. Electronically Signed   By: Anner Crete M.D.   On: 02/24/2019 23:40    ____________________________________________   PROCEDURES  Procedure(s) performed (including Critical Care):  Procedures   ____________________________________________   INITIAL IMPRESSION / ASSESSMENT AND PLAN / ED COURSE  As part of my medical decision making, I reviewed the following data within the [place] History obtained from family, Nursing notes reviewed and incorporated, Radiograph reviewed and Notes from prior ED visits     SHAWNETTA LEIN was evaluated in Emergency Department on 02/25/2019 for the symptoms described in the history of present illness. She was evaluated in the context of the global COVID-19  pandemic, which necessitated consideration that the patient might be at risk for infection with the SARS-CoV-2 virus that causes COVID-19. Institutional protocols and algorithms that pertain to the evaluation of patients at risk for COVID-19 are in a state of rapid change based on information released by regulatory bodies including the CDC and federal and state organizations. These policies and algorithms were followed during the patient's care in the ED.   73 year old female who presents to the ED status post mechanical fall with left ankle injury.  X-rays demonstrate bimalleolar fracture.  There is no dislocation.  Will place in splint, provide walker, analgesia and patient will follow closely with orthopedics.  Strict return precautions given.  Patient and spouse verbalize understanding agree with plan of care.      ____________________________________________   FINAL CLINICAL IMPRESSION(S) / ED DIAGNOSES  Final diagnoses:  Fall, initial encounter  Bimalleolar fracture of left ankle, closed, initial encounter     ED Discharge Orders         Ordered    HYDROcodone-acetaminophen (NORCO) 5-325 MG tablet  Every 6 hours PRN     02/25/19 0012           Note:  This document was prepared using Dragon voice recognition software and may include unintentional dictation errors.   Paulette Blanch, MD 02/25/19 0300

## 2019-02-24 NOTE — ED Notes (Signed)
Patient with obvious fracture to left ankle after a fall

## 2019-02-25 MED ORDER — ONDANSETRON 4 MG PO TBDP
4.0000 mg | ORAL_TABLET | Freq: Once | ORAL | Status: AC
  Administered 2019-02-25: 4 mg via ORAL
  Filled 2019-02-25: qty 1

## 2019-02-25 MED ORDER — MORPHINE SULFATE (PF) 4 MG/ML IV SOLN
4.0000 mg | Freq: Once | INTRAVENOUS | Status: AC
  Administered 2019-02-25: 4 mg via INTRAMUSCULAR
  Filled 2019-02-25: qty 1

## 2019-02-25 MED ORDER — HYDROCODONE-ACETAMINOPHEN 5-325 MG PO TABS: 1.0000 | ORAL_TABLET | Freq: Four times a day (QID) | ORAL | 0 refills | Status: DC | PRN

## 2019-02-25 MED ORDER — HYDROCODONE-ACETAMINOPHEN 5-325 MG PO TABS
2.0000 | ORAL_TABLET | Freq: Once | ORAL | Status: AC
  Administered 2019-02-25: 2 via ORAL
  Filled 2019-02-25: qty 2

## 2019-02-25 NOTE — Discharge Instructions (Addendum)
1.  Take pain medicine as needed (Norco #30). 2.  Keep splint clean and dry.  Do not bear weight on your left ankle.  Elevate affected area and apply ice over splint several times daily to reduce swelling.  3.  Use walker to help you balance as you walk. 4.  Return to the ER for worsening symptoms, persistent vomiting, difficulty breathing or other concerns.

## 2019-03-10 ENCOUNTER — Other Ambulatory Visit: Payer: Self-pay | Admitting: Family Medicine

## 2019-03-12 NOTE — Telephone Encounter (Signed)
Ok to refill??  Last office visit 11/07/2018.  Last refill 02/05/2019.

## 2019-04-30 ENCOUNTER — Other Ambulatory Visit: Payer: Self-pay | Admitting: Family Medicine

## 2019-05-03 ENCOUNTER — Ambulatory Visit (INDEPENDENT_AMBULATORY_CARE_PROVIDER_SITE_OTHER): Payer: Medicare Other

## 2019-05-03 ENCOUNTER — Other Ambulatory Visit: Payer: Self-pay

## 2019-05-03 DIAGNOSIS — Z23 Encounter for immunization: Secondary | ICD-10-CM | POA: Diagnosis not present

## 2019-05-04 ENCOUNTER — Other Ambulatory Visit: Payer: Self-pay | Admitting: Family Medicine

## 2019-05-14 ENCOUNTER — Other Ambulatory Visit: Payer: Self-pay | Admitting: Family Medicine

## 2019-05-14 NOTE — Telephone Encounter (Signed)
Ok to refill Xanax??  Last office visit 11/07/2018.  Last refill 03/13/2019.

## 2019-06-06 LAB — HM MAMMOGRAPHY

## 2019-06-09 ENCOUNTER — Other Ambulatory Visit: Payer: Self-pay | Admitting: Family Medicine

## 2019-06-11 NOTE — Telephone Encounter (Signed)
Pt is requesting refill on Xanax   LOV: 11/07/18  LRF:   05/14/19

## 2019-06-12 ENCOUNTER — Encounter: Payer: Self-pay | Admitting: *Deleted

## 2019-07-26 ENCOUNTER — Other Ambulatory Visit: Payer: Self-pay | Admitting: Family Medicine

## 2019-08-01 ENCOUNTER — Other Ambulatory Visit: Payer: Self-pay | Admitting: Family Medicine

## 2019-08-14 ENCOUNTER — Other Ambulatory Visit: Payer: Self-pay | Admitting: Family Medicine

## 2019-08-23 ENCOUNTER — Other Ambulatory Visit: Payer: Self-pay | Admitting: Family Medicine

## 2019-09-03 ENCOUNTER — Other Ambulatory Visit: Payer: Self-pay | Admitting: Family Medicine

## 2019-09-03 DIAGNOSIS — G4700 Insomnia, unspecified: Secondary | ICD-10-CM

## 2019-10-02 ENCOUNTER — Other Ambulatory Visit: Payer: Self-pay | Admitting: Family Medicine

## 2019-10-02 NOTE — Telephone Encounter (Signed)
Ok to refill??  Last office visit 11/07/2018.  Last refill 06/11/2019, #2 refills.   Of note, letter sent to schedule OV.

## 2019-11-03 ENCOUNTER — Other Ambulatory Visit: Payer: Self-pay | Admitting: Family Medicine

## 2019-11-05 NOTE — Telephone Encounter (Signed)
Ok to refill??  Last office visit 11/07/2018.  Last refill 10/02/2019.  Of note, letter sent to patient to schedule OV.

## 2019-11-13 ENCOUNTER — Other Ambulatory Visit: Payer: Self-pay | Admitting: Family Medicine

## 2019-11-22 ENCOUNTER — Ambulatory Visit (INDEPENDENT_AMBULATORY_CARE_PROVIDER_SITE_OTHER): Payer: Medicare Other | Admitting: Family Medicine

## 2019-11-22 ENCOUNTER — Other Ambulatory Visit: Payer: Self-pay

## 2019-11-22 ENCOUNTER — Encounter: Payer: Self-pay | Admitting: Family Medicine

## 2019-11-22 VITALS — BP 160/70 | HR 117 | Temp 97.7°F | Wt 170.0 lb

## 2019-11-22 DIAGNOSIS — G4700 Insomnia, unspecified: Secondary | ICD-10-CM

## 2019-11-22 DIAGNOSIS — Z0000 Encounter for general adult medical examination without abnormal findings: Secondary | ICD-10-CM

## 2019-11-22 DIAGNOSIS — E785 Hyperlipidemia, unspecified: Secondary | ICD-10-CM

## 2019-11-22 DIAGNOSIS — Z1211 Encounter for screening for malignant neoplasm of colon: Secondary | ICD-10-CM | POA: Diagnosis not present

## 2019-11-22 DIAGNOSIS — E1169 Type 2 diabetes mellitus with other specified complication: Secondary | ICD-10-CM | POA: Diagnosis not present

## 2019-11-22 DIAGNOSIS — Z0001 Encounter for general adult medical examination with abnormal findings: Secondary | ICD-10-CM | POA: Diagnosis not present

## 2019-11-22 DIAGNOSIS — F419 Anxiety disorder, unspecified: Secondary | ICD-10-CM

## 2019-11-22 DIAGNOSIS — I10 Essential (primary) hypertension: Secondary | ICD-10-CM | POA: Diagnosis not present

## 2019-11-22 DIAGNOSIS — N183 Chronic kidney disease, stage 3 unspecified: Secondary | ICD-10-CM

## 2019-11-22 MED ORDER — LOSARTAN POTASSIUM 50 MG PO TABS: 50.0000 mg | ORAL_TABLET | Freq: Every day | ORAL | 3 refills | Status: DC

## 2019-11-22 NOTE — Progress Notes (Signed)
Subjective:    Patient ID: Kathryn Wall, female    DOB: 08/25/1945, 74 y.o.   MRN: [id number]  Medication Refill  Hypertension  Diabetes  Patient is here today because we would not refill her medications until she has an office visit.  She is taking very high-dose Xanax which she has been taking for quite some time.  In addition to this she would like a sleeping pill because she is not sleeping.  I explained to the patient I do not feel comfortable adding any additional medication that could cause respiratory depression particular given her age and mild memory impairment.  I did offer referral to psychiatry however she states that she does not want to do that.  Blood pressure today is very high at 160/70.  However reviewing the patient's medication list, she has not been taking her losartan.  She has been taking amlodipine.  Recommended the patient resume her losartan immediately and then recheck the blood pressure in 2 weeks.  She is due for a colonoscopy.  Has been 6 years since she last had a colonoscopy.  She has a family history of colon cancer and is recommended she does a colonoscopy every 5 years.  Her mammogram was performed in December and is up-to-date.  She has a bone density test scheduled for next week.  Her most recent immunizations are listed below.  She is due only for the shingles vaccine: Immunization History  Administered Date(s) Administered  . Fluad Quad(high Dose 65+) 05/03/2019  . Influenza Split 05/31/2011  . Influenza, High Dose Seasonal PF 04/30/2013, 03/17/2017  . Influenza,inj,Quad PF,6+ Mos 04/21/2015, 05/25/2016  . PFIZER SARS-COV-2 Vaccination 07/18/2019, 08/08/2019  . Pneumococcal Conjugate-13 08/08/2013, 10/18/2013  . Pneumococcal Polysaccharide-23 05/31/2011    Past Medical History:  Diagnosis Date  . Breast cancer ([place]) 1997   AGE 43, BRCA 1 NEGATIVE 2. 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 CKD (chronic kidney disease), stage III    . Diabetes mellitus    TYPE II  . High cholesterol   . Hypertension    Past Surgical History:  Procedure Laterality Date  . ABDOMINAL HYSTERECTOMY  1985   TAH  . BREAST SURGERY  1997   RIGHT BREAST LUMPECTOMY  . CHOLECYSTECTOMY  1980   Current Outpatient Medications on File Prior to Visit  Medication Sig Dispense Refill  . ALPRAZolam (XANAX) 0.5 MG tablet TAKE 1 TABLET BY MOUTH 4 TIMES DAILY AS.NEEDED. NEED OFFICE VISIT FOR REFILLS. 120 tablet 0  . amLODipine (NORVASC) 5 MG tablet TAKE 1 TABLET BY MOUTH ONCE DAILY 90 tablet 0  . atorvastatin (LIPITOR) 40 MG tablet TAKE 1 TABLET BY MOUTH AT BEDTIME 90 tablet 3  . HYDROcodone-acetaminophen (NORCO) 5-325 MG tablet Take 1 tablet by mouth every 6 (six) hours as needed for moderate pain. 30 tablet 0  . hydrocortisone (PROCTOCORT) 1 % CREA Use 1 application BID PRN. 30 g 1  . JANUMET 50-1000 MG tablet TAKE 1 TABLET BY MOUTH TWICE A DAY WITH A MEAL 180 tablet 0  . JARDIANCE 25 MG TABS tablet TAKE 1 TABLET BY MOUTH ONCE A DAY 90 tablet 1  . Multiple Vitamins-Minerals (MULTIVITAMINS THER. W/MINERALS) TABS Take 1 tablet by mouth daily. Reported on 11/10/2015    . nystatin ointment (MYCOSTATIN) APPLY TO AFFECTED AREAS TWICE DAILY 30 g 0  . pantoprazole (PROTONIX) 40 MG tablet TAKE 1 TABLET BY MOUTH EVERY MORNING 90 tablet 3  . PARoxetine (PAXIL) 20 MG tablet TAKE  1 TABLET BY MOUTH ONCE DAILY 90 tablet 3  . QUEtiapine (SEROQUEL) 50 MG tablet TAKE 2 TABLETS BY MOUTH EVERY NIGHT AT BEDTIME 60 tablet 5   No current facility-administered medications on file prior to visit.   Allergies  Allergen Reactions  . Allergen [Antipyrine-Benzocaine]   . Ciprofloxacin     Nausea   . Cymbalta [Duloxetine Hcl]   . Doxazosin   . Other     SENSITIVE TO ANTIBIOTICS  . Percocet [Oxycodone-Acetaminophen] Nausea And Vomiting   Social History   Socioeconomic History  . Marital status: Married    Spouse name: Not on file  . Number of children: Not on file   . Years of education: Not on file  . Highest education level: Not on file  Occupational History  . Not on file  Tobacco Use  . Smoking status: Never Smoker  . Smokeless tobacco: Never Used  Substance and Sexual Activity  . Alcohol use: No  . Drug use: No  . Sexual activity: Yes    Birth control/protection: Post-menopausal, Surgical    Comment: HYST  Other Topics Concern  . Not on file  Social History Narrative  . Not on file   Social Determinants of Health   Financial Resource Strain:   . Difficulty of Paying Living Expenses:   Food Insecurity:   . Worried About Charity fundraiser in the Last Year:   . Arboriculturist in the Last Year:   Transportation Needs:   . Film/video editor (Medical):   Marland Kitchen Lack of Transportation (Non-Medical):   Physical Activity:   . Days of Exercise per Week:   . Minutes of Exercise per Session:   Stress:   . Feeling of Stress :   Social Connections:   . Frequency of Communication with Friends and Family:   . Frequency of Social Gatherings with Friends and Family:   . Attends Religious Services:   . Active Member of Clubs or Organizations:   . Attends Archivist Meetings:   Marland Kitchen Marital Status:   Intimate Partner Violence:   . Fear of Current or Ex-Partner:   . Emotionally Abused:   Marland Kitchen Physically Abused:   . Sexually Abused:    Family History  Problem Relation Age of Onset  . Cancer Mother        COLON  . Hypertension Father   . Heart disease Father       Review of Systems  All other systems reviewed and are negative.      Objective:   Physical Exam  Constitutional: She is oriented to person, place, and time. She appears well-developed and well-nourished. No distress.  HENT:  Head: Normocephalic and atraumatic.  Right Ear: External ear normal.  Left Ear: External ear normal.  Nose: Nose normal.  Mouth/Throat: Oropharynx is clear and moist. No oropharyngeal exudate.  Eyes: Pupils are equal, round, and reactive  to light. Conjunctivae are normal. Right eye exhibits no discharge. Left eye exhibits no discharge. No scleral icterus.  Neck: No tracheal deviation present. No thyromegaly present.  Cardiovascular: Normal rate, regular rhythm, normal heart sounds and intact distal pulses. Exam reveals no gallop and no friction rub.  No murmur heard. Pulmonary/Chest: Effort normal and breath sounds normal. No stridor. No respiratory distress. She has no wheezes. She has no rales.  Abdominal: Soft. Bowel sounds are normal. She exhibits no distension and no mass. There is no abdominal tenderness. There is no rebound and no guarding.  Musculoskeletal:  General: No edema.     Cervical back: Normal range of motion and neck supple.  Lymphadenopathy:    She has no cervical adenopathy.  Neurological: She is alert and oriented to person, place, and time. She displays normal reflexes. No cranial nerve deficit. She exhibits normal muscle tone. Coordination normal.  Skin: Skin is warm. No rash noted. She is not diaphoretic. No erythema. No pallor.  Psychiatric: Her speech is normal and behavior is normal. Judgment and thought content normal. Her mood appears anxious. Cognition and memory are normal.  Vitals reviewed.         Assessment & Plan:  Type 2 diabetes mellitus with hyperlipidemia ([place]) - Plan: Hemoglobin A1c, CBC with Differential/Platelet, COMPLETE METABOLIC PANEL WITH GFR, Microalbumin, urine, LDL Cholesterol, Direct  Colon cancer screening - Plan: Ambulatory referral to Gastroenterology  Benign essential HTN  General medical exam  Insomnia, unspecified type  Stage 3 chronic kidney disease, unspecified whether stage 3a or 3b CKD  Anxiety  Patient's blood pressure today is elevated.  Please resume losartan and recheck blood pressure in 2 weeks.  Check hemoglobin A1c.  Goal hemoglobin A1c for her age would be less than 7.  Check a direct LDL cholesterol because of concern if I do not get lab  work today even though she is fasting I will not have any lab work as she often fails to come back.  Check urine microalbumin.  Regarding her insomnia, I do not feel comfortable increasing the on Xanax that she is already taking.  She is on a cumulative dose of 4 mg a day which I feel is excessive however I have continued at the present time due to the chronicity of which she is taken.  This was previously prescribed by her psychiatrist.  I offered the patient a referral back to her psychiatrist however she declined this.  I explained the patient that additional sedatives could cause memory loss, falls, respiratory depression.  I do not feel that this is good medical care and therefore refused.  Immunizations are up-to-date except for the shingles vaccine.  Diabetic foot exam was performed today and is normal.  I will schedule patient for colonoscopy.  Mammogram is up-to-date.  Her bone density test is scheduled for next week.

## 2019-11-24 LAB — CBC WITH DIFFERENTIAL/PLATELET
Absolute Monocytes: 333 cells/uL (ref 200–950)
Basophils Absolute: 70 cells/uL (ref 0–200)
Basophils Relative: 1.1 %
Eosinophils Absolute: 288 cells/uL (ref 15–500)
Eosinophils Relative: 4.5 %
HCT: 40.6 % (ref 35.0–45.0)
Hemoglobin: 12.8 g/dL (ref 11.7–15.5)
Lymphs Abs: 2355 cells/uL (ref 850–3900)
MCH: 28.6 pg (ref 27.0–33.0)
MCHC: 31.5 g/dL — ABNORMAL LOW (ref 32.0–36.0)
MCV: 90.8 fL (ref 80.0–100.0)
MPV: 13 fL — ABNORMAL HIGH (ref 7.5–12.5)
Monocytes Relative: 5.2 %
Neutro Abs: 3354 cells/uL (ref 1500–7800)
Neutrophils Relative %: 52.4 %
Platelets: 319 10*3/uL (ref 140–400)
RBC: 4.47 10*6/uL (ref 3.80–5.10)
RDW: 14.3 % (ref 11.0–15.0)
Total Lymphocyte: 36.8 %
WBC: 6.4 10*3/uL (ref 3.8–10.8)

## 2019-11-24 LAB — COMPLETE METABOLIC PANEL WITH GFR
AG Ratio: 1.6 (calc) (ref 1.0–2.5)
ALT: 19 U/L (ref 6–29)
AST: 26 U/L (ref 10–35)
Albumin: 4.4 g/dL (ref 3.6–5.1)
Alkaline phosphatase (APISO): 72 U/L (ref 37–153)
BUN/Creatinine Ratio: 14 (calc) (ref 6–22)
BUN: 15 mg/dL (ref 7–25)
CO2: 22 mmol/L (ref 20–32)
Calcium: 9.7 mg/dL (ref 8.6–10.4)
Chloride: 99 mmol/L (ref 98–110)
Creat: 1.06 mg/dL — ABNORMAL HIGH (ref 0.60–0.93)
GFR, Est African American: 59 mL/min/{1.73_m2} — ABNORMAL LOW (ref >=60)
GFR, Est Non African American: 51 mL/min/{1.73_m2} — ABNORMAL LOW (ref >=60)
Globulin: 2.7 g/dL (calc) (ref 1.9–3.7)
Glucose, Bld: 311 mg/dL — ABNORMAL HIGH (ref 65–99)
Potassium: 4.3 mmol/L (ref 3.5–5.3)
Sodium: 136 mmol/L (ref 135–146)
Total Bilirubin: 0.5 mg/dL (ref 0.2–1.2)
Total Protein: 7.1 g/dL (ref 6.1–8.1)

## 2019-11-24 LAB — MICROALBUMIN, URINE: Microalb, Ur: 2.7 mg/dL

## 2019-11-24 LAB — LDL CHOLESTEROL, DIRECT: Direct LDL: 62 mg/dL (ref <100)

## 2019-11-24 LAB — HEMOGLOBIN A1C
Hgb A1c MFr Bld: 7.8 % of total Hgb — ABNORMAL HIGH (ref <5.7)
Mean Plasma Glucose: 177 (calc)
eAG (mmol/L): 9.8 (calc)

## 2019-12-06 ENCOUNTER — Other Ambulatory Visit: Payer: Self-pay | Admitting: Family Medicine

## 2019-12-06 NOTE — Telephone Encounter (Signed)
Ok to refill??  Last office visit 11/22/2019.  Last refill 11/05/2019.

## 2019-12-13 ENCOUNTER — Other Ambulatory Visit: Payer: Self-pay | Admitting: Family Medicine

## 2020-01-14 ENCOUNTER — Other Ambulatory Visit: Payer: Self-pay | Admitting: Family Medicine

## 2020-01-14 NOTE — Telephone Encounter (Signed)
Ok to refill??  Last office visit 11/22/2019.  Last refill 12/06/2019.

## 2020-01-28 ENCOUNTER — Other Ambulatory Visit: Payer: Self-pay | Admitting: Family Medicine

## 2020-01-30 ENCOUNTER — Other Ambulatory Visit: Payer: Self-pay | Admitting: Family Medicine

## 2020-02-15 ENCOUNTER — Other Ambulatory Visit: Payer: Self-pay | Admitting: Family Medicine

## 2020-02-15 NOTE — Telephone Encounter (Signed)
Ok to refill??  Last office visit 11/22/2019.  Last refill 01/14/2020.

## 2020-02-21 ENCOUNTER — Other Ambulatory Visit: Payer: Self-pay

## 2020-02-21 ENCOUNTER — Encounter (HOSPITAL_COMMUNITY): Payer: Self-pay | Admitting: *Deleted

## 2020-02-21 ENCOUNTER — Emergency Department (HOSPITAL_COMMUNITY): Payer: Medicare Other

## 2020-02-21 DIAGNOSIS — W108XXA Fall (on) (from) other stairs and steps, initial encounter: Secondary | ICD-10-CM | POA: Diagnosis not present

## 2020-02-21 DIAGNOSIS — Z7984 Long term (current) use of oral hypoglycemic drugs: Secondary | ICD-10-CM | POA: Diagnosis not present

## 2020-02-21 DIAGNOSIS — Z853 Personal history of malignant neoplasm of breast: Secondary | ICD-10-CM | POA: Diagnosis not present

## 2020-02-21 DIAGNOSIS — Y999 Unspecified external cause status: Secondary | ICD-10-CM | POA: Insufficient documentation

## 2020-02-21 DIAGNOSIS — R0789 Other chest pain: Secondary | ICD-10-CM | POA: Insufficient documentation

## 2020-02-21 DIAGNOSIS — S0003XA Contusion of scalp, initial encounter: Secondary | ICD-10-CM | POA: Diagnosis not present

## 2020-02-21 DIAGNOSIS — E1122 Type 2 diabetes mellitus with diabetic chronic kidney disease: Secondary | ICD-10-CM | POA: Diagnosis not present

## 2020-02-21 DIAGNOSIS — Y929 Unspecified place or not applicable: Secondary | ICD-10-CM | POA: Diagnosis not present

## 2020-02-21 DIAGNOSIS — Y9389 Activity, other specified: Secondary | ICD-10-CM | POA: Insufficient documentation

## 2020-02-21 DIAGNOSIS — N183 Chronic kidney disease, stage 3 unspecified: Secondary | ICD-10-CM | POA: Diagnosis not present

## 2020-02-21 DIAGNOSIS — I129 Hypertensive chronic kidney disease with stage 1 through stage 4 chronic kidney disease, or unspecified chronic kidney disease: Secondary | ICD-10-CM | POA: Diagnosis not present

## 2020-02-21 DIAGNOSIS — Z79899 Other long term (current) drug therapy: Secondary | ICD-10-CM | POA: Diagnosis not present

## 2020-02-21 DIAGNOSIS — S0990XA Unspecified injury of head, initial encounter: Secondary | ICD-10-CM | POA: Diagnosis present

## 2020-02-21 NOTE — ED Triage Notes (Signed)
Pt states she was going to sit down and missed the wall she was going to sit on. She fell forward onto cement, flipped around and then hit her head. Pt reports central chest pain, radiates to both arms. No blood thinners, no LOC. Has had frequent falls.

## 2020-02-22 ENCOUNTER — Emergency Department (HOSPITAL_COMMUNITY): Payer: Medicare Other

## 2020-02-22 ENCOUNTER — Emergency Department (HOSPITAL_COMMUNITY)
Admission: EM | Admit: 2020-02-22 | Discharge: 2020-02-22 | Disposition: A | Discharge status: Home / Self Care | Payer: Medicare Other | Attending: Emergency Medicine | Admitting: Emergency Medicine

## 2020-02-22 DIAGNOSIS — S0990XA Unspecified injury of head, initial encounter: Secondary | ICD-10-CM

## 2020-02-22 DIAGNOSIS — W19XXXA Unspecified fall, initial encounter: Secondary | ICD-10-CM

## 2020-02-22 DIAGNOSIS — R030 Elevated blood-pressure reading, without diagnosis of hypertension: Secondary | ICD-10-CM

## 2020-02-22 DIAGNOSIS — R0789 Other chest pain: Secondary | ICD-10-CM

## 2020-02-22 LAB — CBC WITH DIFFERENTIAL/PLATELET
Abs Immature Granulocytes: 0.07 10*3/uL (ref 0.00–0.07)
Basophils Absolute: 0.1 10*3/uL (ref 0.0–0.1)
Basophils Relative: 1 %
Eosinophils Absolute: 0.3 10*3/uL (ref 0.0–0.5)
Eosinophils Relative: 2 %
HCT: 44.2 % (ref 36.0–46.0)
Hemoglobin: 13.8 g/dL (ref 12.0–15.0)
Immature Granulocytes: 1 %
Lymphocytes Relative: 29 %
Lymphs Abs: 3.8 10*3/uL (ref 0.7–4.0)
MCH: 29.4 pg (ref 26.0–34.0)
MCHC: 31.2 g/dL (ref 30.0–36.0)
MCV: 94.2 fL (ref 80.0–100.0)
Monocytes Absolute: 0.7 10*3/uL (ref 0.1–1.0)
Monocytes Relative: 5 %
Neutro Abs: 8.4 10*3/uL — ABNORMAL HIGH (ref 1.7–7.7)
Neutrophils Relative %: 62 %
Platelets: 305 10*3/uL (ref 150–400)
RBC: 4.69 MIL/uL (ref 3.87–5.11)
RDW: 15.2 % (ref 11.5–15.5)
WBC: 13.4 10*3/uL — ABNORMAL HIGH (ref 4.0–10.5)
nRBC: 0 % (ref 0.0–0.2)

## 2020-02-22 LAB — BASIC METABOLIC PANEL
Anion gap: 15 (ref 5–15)
BUN: 19 mg/dL (ref 8–23)
CO2: 20 mmol/L — ABNORMAL LOW (ref 22–32)
Calcium: 9.5 mg/dL (ref 8.9–10.3)
Chloride: 103 mmol/L (ref 98–111)
Creatinine, Ser: 1.24 mg/dL — ABNORMAL HIGH (ref 0.44–1.00)
GFR calc Af Amer: 49 mL/min — ABNORMAL LOW (ref >60)
GFR calc non Af Amer: 42 mL/min — ABNORMAL LOW (ref >60)
Glucose, Bld: 227 mg/dL — ABNORMAL HIGH (ref 70–99)
Potassium: 3.9 mmol/L (ref 3.5–5.1)
Sodium: 138 mmol/L (ref 135–145)

## 2020-02-22 LAB — TROPONIN I (HIGH SENSITIVITY): Troponin I (High Sensitivity): 6 ng/L (ref <18)

## 2020-02-22 MED ORDER — FENTANYL CITRATE (PF) 100 MCG/2ML IJ SOLN
50.0000 ug | Freq: Once | INTRAMUSCULAR | Status: AC
  Administered 2020-02-22: 50 ug via INTRAVENOUS
  Filled 2020-02-22: qty 2

## 2020-02-22 MED ORDER — HYDROCODONE-ACETAMINOPHEN 5-325 MG PO TABS: 1.0000 | ORAL_TABLET | Freq: Four times a day (QID) | ORAL | 0 refills | Status: DC | PRN

## 2020-02-22 MED ORDER — HYDROCODONE-ACETAMINOPHEN 5-325 MG PO TABS
1.0000 | ORAL_TABLET | Freq: Once | ORAL | Status: AC
  Administered 2020-02-22: 1 via ORAL
  Filled 2020-02-22: qty 1

## 2020-02-22 NOTE — ED Provider Notes (Signed)
[facility] DEPT Provider Note   CSN: [id number] Arrival date & time: 02/21/20  2059     History Chief Complaint  Patient presents with  . Panacea is a 74 y.o. female history of diabetes, CKD, hypertension, high cholesterol, osteopenia, cholecystectomy, hysterectomy.  Patient presents today for chest pain after a fall that occurred around 4 PM.  She was walking on the stairs using her cane when she lost her balance falling forward, she reports that she fell and hit her chest on the ground and somehow also hit the back of her head on the ground at the same time, she is unclear on how she was able to have both of these areas.  She reports that immediately after her fall and she began experiencing chest pain, central pain moderate intensity constant worsened with arm movement, pain will radiate to her arms when she moves them around, no alleviating factors.  She denies loss of consciousness, blood thinner use, headache, vision changes, nausea/vomiting, neck pain, back pain, abdominal pain, extremity swelling/color change or any additional concerns.  HPI     Past Medical History:  Diagnosis Date  . Breast cancer ([place]) 1997   AGE 53, BRCA 1 NEGATIVE 2. 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 CKD (chronic kidney disease), stage III   . Diabetes mellitus    TYPE II  . High cholesterol   . Hypertension     Patient Active Problem List   Diagnosis Date Noted  . Osteopenia of femoral neck, bilateral 10/15/2016  . Breast cancer of lower-outer quadrant of right female breast ([place]) 04/02/2014  . CKD (chronic kidney disease), stage III   . Essential hypertension, benign 08/14/2012  . Hypercholesteremia 08/14/2012  . Diabetes ([place]) 08/14/2012  . Pyelonephritis 05/29/2011  . Constipation 05/29/2011  . AKI (acute kidney injury) ([place]) 05/29/2011    Past Surgical History:  Procedure Laterality Date  . ABDOMINAL HYSTERECTOMY   1985   TAH  . BREAST SURGERY  1997   RIGHT BREAST LUMPECTOMY  . CHOLECYSTECTOMY  1980     OB History    Gravida  3   Para  2   Term  2   Preterm      AB  1   Living  2     SAB  1   TAB      Ectopic      Multiple      Live Births              Family History  Problem Relation Age of Onset  . Cancer Mother        COLON  . Hypertension Father   . Heart disease Father     Social History   Tobacco Use  . Smoking status: Never Smoker  . Smokeless tobacco: Never Used  Substance Use Topics  . Alcohol use: No  . Drug use: No    Home Medications Prior to Admission medications   Medication Sig Start Date End Date Taking? Authorizing Provider  ALPRAZolam (XANAX) 0.5 MG tablet TAKE 1 TABLET BY MOUTH 4 TIMES DAILY AS.NEEDED. 02/15/20   [name], MD  amLODipine (NORVASC) 5 MG tablet TAKE 1 TABLET BY MOUTH ONCE DAILY 01/30/20   [name], MD  atorvastatin (LIPITOR) 40 MG tablet TAKE 1 TABLET BY MOUTH AT BEDTIME 01/30/20   [name], MD  HYDROcodone-acetaminophen (NORCO/VICODIN) 5-325 MG tablet Take 1 tablet by mouth  every 6 (six) hours as needed. 02/22/20   Deliah Boston, PA-C  hydrocortisone (PROCTOCORT) 1 % CREA Use 1 application BID PRN. 09/25/15   [name], MD  JANUMET 50-1000 MG tablet TAKE 1 TABLET BY MOUTH TWICE A DAY WITH A MEAL 12/13/19   [name], MD  JARDIANCE 25 MG TABS tablet TAKE 1 TABLET BY MOUTH ONCE A DAY 08/23/19   [name], MD  losartan (COZAAR) 50 MG tablet Take 1 tablet (50 mg total) by mouth daily. 11/22/19   [name], MD  Multiple Vitamins-Minerals (MULTIVITAMINS THER. W/MINERALS) TABS Take 1 tablet by mouth daily. Reported on 11/10/2015    [provider]  nystatin ointment (MYCOSTATIN) APPLY TO AFFECTED AREAS TWICE DAILY 07/26/19   [name], MD  pantoprazole (PROTONIX) 40 MG tablet TAKE 1 TABLET BY MOUTH EVERY MORNING 05/04/19   [name], MD  PARoxetine (PAXIL)  20 MG tablet TAKE 1 TABLET BY MOUTH ONCE DAILY 08/01/19   [name], MD  QUEtiapine (SEROQUEL) 50 MG tablet TAKE 2 TABLETS BY MOUTH EVERY NIGHT AT BEDTIME 09/03/19   [name], MD    Allergies    Allergen [antipyrine-benzocaine], Ciprofloxacin, Cymbalta [duloxetine hcl], Doxazosin, Other, and Percocet [oxycodone-acetaminophen]  Review of Systems   Review of Systems Ten systems are reviewed and are negative for acute change except as noted in the HPI  Physical Exam Updated Vital Signs BP (!) 153/112   Pulse (!) 103   Temp 97.9 F (36.6 C) (Oral)   Resp 15   SpO2 99%   Physical Exam Constitutional:      General: She is not in acute distress.    Appearance: Normal appearance. She is well-developed. She is not ill-appearing or diaphoretic.  HENT:     Head: Normocephalic. Contusion present.     Jaw: There is normal jaw occlusion. No trismus.      Comments: Contusion occipital scalp    Right Ear: External ear normal. No hemotympanum.     Left Ear: External ear normal. No hemotympanum.     Nose: Nose normal.     Mouth/Throat:     Comments: No evidence of dental injury Eyes:     General: Vision grossly intact. Gaze aligned appropriately.     Extraocular Movements: Extraocular movements intact.     Conjunctiva/sclera: Conjunctivae normal.     Pupils: Pupils are equal, round, and reactive to light.  Neck:     Trachea: Trachea and phonation normal. No tracheal tenderness or tracheal deviation.  Cardiovascular:     Rate and Rhythm: Regular rhythm. Tachycardia present.  Pulmonary:     Effort: Pulmonary effort is normal. No accessory muscle usage or respiratory distress.     Breath sounds: Normal breath sounds and air entry.  Chest:     Chest wall: Tenderness present. No deformity.  Abdominal:     General: There is no distension.     Palpations: Abdomen is soft.     Tenderness: There is no abdominal tenderness. There is no guarding or rebound.  Musculoskeletal:         General: Normal range of motion.     Cervical back: Normal range of motion and neck supple.     Comments: No midline C/T/L spinal tenderness to palpation, no paraspinal muscle tenderness, no deformity, crepitus, or step-off noted. No sign of injury to the neck or back.  Skin:    General: Skin is warm and dry.  Neurological:  Mental Status: She is alert.     GCS: GCS eye subscore is 4. GCS verbal subscore is 5. GCS motor subscore is 6.     Comments: Speech is clear and goal oriented, follows commands Major Cranial nerves without deficit, no facial droop Moves extremities without ataxia, coordination intact  Psychiatric:        Behavior: Behavior normal.     ED Results / Procedures / Treatments   Labs (all labs ordered are listed, but only abnormal results are displayed) Labs Reviewed  BASIC METABOLIC PANEL - Abnormal; Notable for the following components:      Result Value   CO2 20 (*)    Glucose, Bld 227 (*)    Creatinine, Ser 1.24 (*)    GFR calc non Af Amer 42 (*)    GFR calc Af Amer 49 (*)    All other components within normal limits  CBC WITH DIFFERENTIAL/PLATELET - Abnormal; Notable for the following components:   WBC 13.4 (*)    Neutro Abs 8.4 (*)    All other components within normal limits  TROPONIN I (HIGH SENSITIVITY)    EKG EKG Interpretation  Date/Time:  Friday February 22 2020 02:34:07 EDT Ventricular Rate:  101 PR Interval:    QRS Duration: 93 QT Interval:  341 QTC Calculation: 445 R Axis:   53 Text Interpretation: Sinus tachycardia Low voltage, precordial leads Interpretation limited secondary to artifact Confirmed by Ripley Fraise [phone]) on 02/22/2020 2:50:42 AM   Radiology DG Chest 2 View  Result Date: 02/21/2020 CLINICAL DATA:  Recent fall with anterior chest pain, initial encounter EXAM: CHEST - 2 VIEW COMPARISON:  None. FINDINGS: Cardiac shadow is within normal limits. The lungs are clear bilaterally. No definitive rib fracture is seen. Mild  pectus excavatum deformity is noted. IMPRESSION: No acute abnormality noted. Electronically Signed   By: Inez Catalina M.D.   On: 02/21/2020 22:20   CT Head Wo Contrast  Result Date: 02/22/2020 CLINICAL DATA:  74 year old female with head trauma. EXAM: CT HEAD WITHOUT CONTRAST CT CERVICAL SPINE WITHOUT CONTRAST TECHNIQUE: Multidetector CT imaging of the head and cervical spine was performed following the standard protocol without intravenous contrast. Multiplanar CT image reconstructions of the cervical spine were also generated. COMPARISON:  None. FINDINGS: CT HEAD FINDINGS Brain: There is mild age-related atrophy and chronic microvascular ischemic changes. There is no acute intracranial hemorrhage. No mass effect or midline shift. No extra-axial fluid collection. Vascular: No hyperdense vessel or unexpected calcification. Skull: Normal. Negative for fracture or focal lesion. Sinuses/Orbits: No acute finding. Other: None CT CERVICAL SPINE FINDINGS Alignment: No acute subluxation. There is reversal of normal cervical lordosis which may be positional or due to muscle spasm or secondary to degenerative changes. Grade 1 C3-C4 anterolisthesis. Skull base and vertebrae: No acute fracture. Soft tissues and spinal canal: No prevertebral fluid or swelling. No visible canal hematoma. Disc levels: Multilevel degenerative changes with endplate irregularity and disc space narrowing and osteophyte. Upper chest: Negative. Other: Atherosclerotic calcification of the aortic arch. IMPRESSION: 1. No acute intracranial pathology. Mild age-related atrophy and chronic microvascular ischemic changes. 2. No acute/traumatic cervical spine pathology. Multilevel degenerative changes. 3. Aortic Atherosclerosis (ICD10-I70.0). Electronically Signed   By: Anner Crete M.D.   On: 02/22/2020 02:29   CT Cervical Spine Wo Contrast  Result Date: 02/22/2020 CLINICAL DATA:  74 year old female with head trauma. EXAM: CT HEAD WITHOUT CONTRAST  CT CERVICAL SPINE WITHOUT CONTRAST TECHNIQUE: Multidetector CT imaging of the head and cervical spine was  performed following the standard protocol without intravenous contrast. Multiplanar CT image reconstructions of the cervical spine were also generated. COMPARISON:  None. FINDINGS: CT HEAD FINDINGS Brain: There is mild age-related atrophy and chronic microvascular ischemic changes. There is no acute intracranial hemorrhage. No mass effect or midline shift. No extra-axial fluid collection. Vascular: No hyperdense vessel or unexpected calcification. Skull: Normal. Negative for fracture or focal lesion. Sinuses/Orbits: No acute finding. Other: None CT CERVICAL SPINE FINDINGS Alignment: No acute subluxation. There is reversal of normal cervical lordosis which may be positional or due to muscle spasm or secondary to degenerative changes. Grade 1 C3-C4 anterolisthesis. Skull base and vertebrae: No acute fracture. Soft tissues and spinal canal: No prevertebral fluid or swelling. No visible canal hematoma. Disc levels: Multilevel degenerative changes with endplate irregularity and disc space narrowing and osteophyte. Upper chest: Negative. Other: Atherosclerotic calcification of the aortic arch. IMPRESSION: 1. No acute intracranial pathology. Mild age-related atrophy and chronic microvascular ischemic changes. 2. No acute/traumatic cervical spine pathology. Multilevel degenerative changes. 3. Aortic Atherosclerosis (ICD10-I70.0). Electronically Signed   By: Anner Crete M.D.   On: 02/22/2020 02:29    Procedures Procedures (including critical care time)  Medications Ordered in ED Medications  HYDROcodone-acetaminophen (NORCO/VICODIN) 5-325 MG per tablet 1 tablet (has no administration in time range)  fentaNYL (SUBLIMAZE) injection 50 mcg (50 mcg Intravenous Given 02/22/20 0240)    ED Course  I have reviewed the triage vital signs and the nursing notes.  Pertinent labs & imaging results that were  available during my care of the patient were reviewed by me and considered in my medical decision making (see chart for details).    MDM Rules/Calculators/A&P                         Additional history obtained from: 1. Nursing notes from this visit. 2. Family, husband at bedside. --------------------- I ordered, reviewed and interpreted labs which include: CBC shows leukocytosis of 13.4, suspect secondary to pain after fall today, no anemia. BMP shows mild elevation of creatinine at 1.24, no emergent electrolyte derangement or gap. High-sensitivity troponin within normal limits, 6.  Onset of symptoms over 8 hours prior to level being drawn, no indication for delta troponin.  EKG: Sinus tachycardia Low voltage, precordial leads Interpretation limited secondary to artifact Confirmed by Ripley Fraise [phone]) on 02/22/2020 2:50:42 AM  CXR:  IMPRESSION:  No acute abnormality noted.   CT Head/Cspine:  IMPRESSION:  1. No acute intracranial pathology. Mild age-related atrophy and  chronic microvascular ischemic changes.  2. No acute/traumatic cervical spine pathology. Multilevel  degenerative changes.  3. Aortic Atherosclerosis (ICD10-I70.0).  ----------------- Patient reassessed she is resting comfortably in bed no acute distress reports pain improved following fentanyl. Tachycardia also improved suspect earlier tachycardia and hypotension secondary to pain. Patient given incentive spirometer and encouraged to use frequently to avoid development of pneumonia. Suspect patient's chest pain today secondary to musculoskeletal pain after her fall, low suspicion for ACS, PE, dissection, pneumothorax or other emergent pathologies at this time. Patient aware of limitations of x-rays today and that unseen fracture may be present and follow-up with her PCP is recommended and she states understanding. Incidentally patient noted to be hypertensive in the emergency department, patient asymptomatic,  informed to follow-up with her PCP for blood pressure recheck in the next week and discuss medication management at that time. Patient informed of signs/symptoms of hypertensive urgency/emergency and to return to the ER if they occur.  PMD P reviewed patient has prescriptions for Xanax, no opioid prescriptions. Will prescribe patient short course (5 pills) Norco for pain. We discussed narcotic precautions and patient stated understanding, patient also aware to not take Norco with Xanax to avoid sedating effects. Patient reports that she has taken Norco in the past and tolerated well.  At this time there does not appear to be any evidence of an acute emergency medical condition and the patient appears stable for discharge with appropriate outpatient follow up. Diagnosis was discussed with patient who verbalizes understanding of care plan and is agreeable to discharge. I have discussed return precautions with patient who verbalizes understanding. Patient encouraged to follow-up with their PCP. All questions answered.  Patient seen and evaluated by Dr. Christy Gentles during this visit, agrees with discharge and outpatient follow-up.  Note: Portions of this report may have been transcribed using voice recognition software. Every effort was made to ensure accuracy; however, inadvertent computerized transcription errors may still be present. Final Clinical Impression(s) / ED Diagnoses Final diagnoses:  Fall, initial encounter  Chest wall pain  Elevated blood pressure reading  Injury of head, initial encounter    Rx / DC Orders ED Discharge Orders         Ordered    HYDROcodone-acetaminophen (NORCO/VICODIN) 5-325 MG tablet  Every 6 hours PRN        02/22/20 0501           Deliah Boston, PA-C 02/22/20 0505    Ripley Fraise, MD 02/22/20 [phone]

## 2020-02-22 NOTE — Discharge Instructions (Addendum)
At this time there does not appear to be the presence of an emergent medical condition, however there is always the potential for conditions to change. Please read and follow the below instructions.  Please return to the Emergency Department immediately for any new or worsening symptoms. Please be sure to follow up with your Primary Care Provider within one week regarding your visit today; please call their office to schedule an appointment even if you are feeling better for a follow-up visit. You may take the medication Norco (Hydrocodone/Acetaminophen) as prescribed to help with severe pain.  This medication will make you drowsy so do not drive, drink alcohol, take other sedating medications or perform any dangerous activities such as driving after taking Norco. Norco contains Tylenol (acetaminophen) so do not take any other Tylenol-containing products with Norco. Do not take Norco with Xanax as these will cause worsening side effects. Your blood pressure was elevated in the emergency department please have it rechecked by your primary care doctor next week and discuss medication management at that time. Be sure to take all of your home medications as prescribed by your primary care provider. Please use the incentive spirometer given to you today to help take deep breaths to avoid development of a pneumonia. Incidentally your imaging showed age-related atrophy and chronic microvascular ischemic changes of the brain, grade 1 C3-C4 anterior listhesis of the spine, multilevel degenerative changes of the spine, atherosclerotic calcifications of the aortic arch. Please discuss incidental findings with your primary care provider at your follow-up visit.   Get help right away if: You feel sick to your stomach (nauseous) or you throw up (vomit). You feel sweaty or light-headed. You have a cough with mucus from your lungs (sputum) or you cough up blood. You are short of breath. You have any new/concerning or  worsening of symptoms These symptoms may be an emergency. Do not wait to see if the symptoms will go away. Get medical help right away. Call your local emergency services (911 in the [place] Do not drive yourself to the hospital.  Please read the additional information packets attached to your discharge summary.  Do not take your medicine if  develop an itchy rash, swelling in your mouth or lips, or difficulty breathing; call 911 and seek immediate emergency medical attention if this occurs.  You may review your lab tests and imaging results in their entirety on your MyChart account.  Please discuss all results of fully with your primary care provider and other specialist at your follow-up visit.  Note: Portions of this text may have been transcribed using voice recognition software. Every effort was made to ensure accuracy; however, inadvertent computerized transcription errors may still be present.

## 2020-02-22 NOTE — ED Provider Notes (Signed)
Patient seen/examined in the Emergency Department in conjunction with Advanced Practice Provider [facility] Patient reports HA and chest wall pain after fall Exam : awake/alert, no distress, maex4, no obvious deformities Plan: imaging/labs planned.  Anticipate discharge if all testing negative     Ripley Fraise, MD 02/22/20 [phone]

## 2020-03-17 ENCOUNTER — Other Ambulatory Visit: Payer: Self-pay | Admitting: Family Medicine

## 2020-03-27 ENCOUNTER — Other Ambulatory Visit: Payer: Self-pay | Admitting: Family Medicine

## 2020-03-27 NOTE — Telephone Encounter (Signed)
Ok to refill??  Last office visit 11/22/2019.  Last refill 02/15/2020.

## 2020-04-07 ENCOUNTER — Other Ambulatory Visit: Payer: Self-pay | Admitting: Family Medicine

## 2020-04-22 ENCOUNTER — Other Ambulatory Visit: Payer: Self-pay | Admitting: Family Medicine

## 2020-04-22 DIAGNOSIS — G4700 Insomnia, unspecified: Secondary | ICD-10-CM

## 2020-04-25 ENCOUNTER — Other Ambulatory Visit: Payer: Self-pay | Admitting: Family Medicine

## 2020-04-28 NOTE — Telephone Encounter (Signed)
Ok to refill??  Last office visit 11/22/2019.  Last refill 03/27/2020.

## 2020-05-30 ENCOUNTER — Other Ambulatory Visit: Payer: Self-pay | Admitting: Family Medicine

## 2020-05-30 NOTE — Telephone Encounter (Signed)
I will refill one more time, but she needs ov for her meds.

## 2020-05-30 NOTE — Telephone Encounter (Signed)
Ok to refill??  Last office visit 11/22/2019.  Last refill 04/28/2020.  Ok to add refills?

## 2020-05-30 NOTE — Telephone Encounter (Signed)
Letter sent.

## 2020-06-25 ENCOUNTER — Other Ambulatory Visit: Payer: Self-pay | Admitting: Family Medicine

## 2020-07-04 ENCOUNTER — Other Ambulatory Visit: Payer: Self-pay | Admitting: Family Medicine

## 2020-07-07 ENCOUNTER — Telehealth: Payer: Self-pay | Admitting: *Deleted

## 2020-07-07 NOTE — Telephone Encounter (Signed)
Medication is covered benefit.

## 2020-07-07 NOTE — Telephone Encounter (Signed)
Received request from pharmacy for [place] [facility] [address].  PA submitted.   Dx: E11.9- DM  [facility] is reviewing your PA request. Typically an electronic response will be received within 72 hours. To check for an update later, open this request from your dashboard.  You may close this dialog and return to your dashboard to perform other tasks.

## 2020-08-11 ENCOUNTER — Ambulatory Visit: Payer: Medicare Other | Admitting: Family Medicine

## 2020-08-11 ENCOUNTER — Other Ambulatory Visit: Payer: Self-pay

## 2020-08-11 VITALS — BP 140/84 | HR 96 | Temp 97.6°F | Ht 65.0 in | Wt 175.0 lb

## 2020-08-11 DIAGNOSIS — N183 Chronic kidney disease, stage 3 unspecified: Secondary | ICD-10-CM

## 2020-08-11 DIAGNOSIS — E785 Hyperlipidemia, unspecified: Secondary | ICD-10-CM

## 2020-08-11 DIAGNOSIS — E1169 Type 2 diabetes mellitus with other specified complication: Secondary | ICD-10-CM

## 2020-08-11 DIAGNOSIS — I10 Essential (primary) hypertension: Secondary | ICD-10-CM | POA: Diagnosis not present

## 2020-08-11 DIAGNOSIS — Z0001 Encounter for general adult medical examination with abnormal findings: Secondary | ICD-10-CM | POA: Diagnosis not present

## 2020-08-11 DIAGNOSIS — Z0000 Encounter for general adult medical examination without abnormal findings: Secondary | ICD-10-CM

## 2020-08-11 DIAGNOSIS — Z1211 Encounter for screening for malignant neoplasm of colon: Secondary | ICD-10-CM | POA: Diagnosis not present

## 2020-08-11 DIAGNOSIS — F419 Anxiety disorder, unspecified: Secondary | ICD-10-CM

## 2020-08-11 DIAGNOSIS — M85852 Other specified disorders of bone density and structure, left thigh: Secondary | ICD-10-CM

## 2020-08-11 DIAGNOSIS — M85851 Other specified disorders of bone density and structure, right thigh: Secondary | ICD-10-CM

## 2020-08-12 ENCOUNTER — Other Ambulatory Visit: Payer: Self-pay | Admitting: Family Medicine

## 2020-08-12 LAB — CBC WITH DIFFERENTIAL/PLATELET
Absolute Monocytes: 511 cells/uL (ref 200–950)
Basophils Absolute: 80 cells/uL (ref 0–200)
Basophils Relative: 1.1 %
Eosinophils Absolute: 329 cells/uL (ref 15–500)
Eosinophils Relative: 4.5 %
HCT: 40.2 % (ref 35.0–45.0)
Hemoglobin: 13.2 g/dL (ref 11.7–15.5)
Lymphs Abs: 2533 cells/uL (ref 850–3900)
MCH: 29.8 pg (ref 27.0–33.0)
MCHC: 32.8 g/dL (ref 32.0–36.0)
MCV: 90.7 fL (ref 80.0–100.0)
MPV: 12.5 fL (ref 7.5–12.5)
Monocytes Relative: 7 %
Neutro Abs: 3847 cells/uL (ref 1500–7800)
Neutrophils Relative %: 52.7 %
Platelets: 261 10*3/uL (ref 140–400)
RBC: 4.43 10*6/uL (ref 3.80–5.10)
RDW: 13.4 % (ref 11.0–15.0)
Total Lymphocyte: 34.7 %
WBC: 7.3 10*3/uL (ref 3.8–10.8)

## 2020-08-12 LAB — COMPLETE METABOLIC PANEL WITH GFR
AG Ratio: 1.6 (calc) (ref 1.0–2.5)
ALT: 28 U/L (ref 6–29)
AST: 32 U/L (ref 10–35)
Albumin: 4.3 g/dL (ref 3.6–5.1)
Alkaline phosphatase (APISO): 71 U/L (ref 37–153)
BUN/Creatinine Ratio: 14 (calc) (ref 6–22)
BUN: 16 mg/dL (ref 7–25)
CO2: 23 mmol/L (ref 20–32)
Calcium: 9.6 mg/dL (ref 8.6–10.4)
Chloride: 106 mmol/L (ref 98–110)
Creat: 1.11 mg/dL — ABNORMAL HIGH (ref 0.60–0.93)
GFR, Est African American: 56 mL/min/{1.73_m2} — ABNORMAL LOW (ref >=60)
GFR, Est Non African American: 48 mL/min/{1.73_m2} — ABNORMAL LOW (ref >=60)
Globulin: 2.7 g/dL (calc) (ref 1.9–3.7)
Glucose, Bld: 160 mg/dL — ABNORMAL HIGH (ref 65–99)
Potassium: 4.7 mmol/L (ref 3.5–5.3)
Sodium: 139 mmol/L (ref 135–146)
Total Bilirubin: 0.5 mg/dL (ref 0.2–1.2)
Total Protein: 7 g/dL (ref 6.1–8.1)

## 2020-08-12 LAB — LIPID PANEL
Cholesterol: 148 mg/dL (ref <200)
HDL: 37 mg/dL — ABNORMAL LOW (ref >=50)
LDL Cholesterol (Calc): 70 mg/dL (calc)
Non-HDL Cholesterol (Calc): 111 mg/dL (calc) (ref <130)
Total CHOL/HDL Ratio: 4 (calc) (ref <5.0)
Triglycerides: 340 mg/dL — ABNORMAL HIGH (ref <150)

## 2020-08-12 LAB — HEMOGLOBIN A1C
Hgb A1c MFr Bld: 7.9 % of total Hgb — ABNORMAL HIGH (ref <5.7)
Mean Plasma Glucose: 180 mg/dL
eAG (mmol/L): 10 mmol/L

## 2020-08-12 NOTE — Progress Notes (Signed)
Subjective:    Patient ID: Kathryn Wall, female    DOB: 13-Oct-1945, 75 y.o.   MRN: [id number]  Patient is due for a colonoscopy.  Her mammogram is up-to-date.  She is due for a bone density test however she is waiting to schedule this on her own.  She does not require Pap smear.  Her immunizations are listed below.  I have recommended the shingles vaccine.  Patient denies any problems with falls although she did fall in the last few years and fractured her left leg.  She denies any depression.  She denies any memory loss however I do have some concerns about mild short-term memory loss in this patient.  She is long overdue to check her diabetes test.  The last time I saw the patient, was in May of last year.  She denies any polyuria, polydipsia, blurry vision.  She denies any chest pain shortness of breath or dyspnea on exertion.  Blood pressure today is acceptable at 140/84. Immunization History  Administered Date(s) Administered  . Fluad Quad(high Dose 65+) 05/03/2019  . Influenza Split 05/31/2011  . Influenza, High Dose Seasonal PF 04/30/2013, 03/17/2017  . Influenza,inj,Quad PF,6+ Mos 04/21/2015, 05/25/2016  . PFIZER(Purple Top)SARS-COV-2 Vaccination 07/18/2019, 08/08/2019  . Pneumococcal Conjugate-13 08/08/2013, 10/18/2013  . Pneumococcal Polysaccharide-23 05/31/2011    Past Medical History:  Diagnosis Date  . Breast cancer ([place]) 1997   AGE 75, BRCA 1 NEGATIVE 2. 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 CKD (chronic kidney disease), stage III   . Diabetes mellitus    TYPE II  . High cholesterol   . Hypertension    Past Surgical History:  Procedure Laterality Date  . ABDOMINAL HYSTERECTOMY  1985   TAH  . BREAST SURGERY  1997   RIGHT BREAST LUMPECTOMY  . CHOLECYSTECTOMY  1980   Current Outpatient Medications on File Prior to Visit  Medication Sig Dispense Refill  . ALPRAZolam (XANAX) 0.5 MG tablet TAKE 1 TABLET BY MOUTH 4 TIMES DAILY AS.NEEDED. NEED OFFICE  VISIT FOR REFILLS. 120 tablet 0  . amLODipine (NORVASC) 5 MG tablet TAKE 1 TABLET BY MOUTH ONCE DAILY 90 tablet 0  . atorvastatin (LIPITOR) 40 MG tablet TAKE 1 TABLET BY MOUTH AT BEDTIME 90 tablet 3  . HYDROcodone-acetaminophen (NORCO/VICODIN) 5-325 MG tablet Take 1 tablet by mouth every 6 (six) hours as needed. 5 tablet 0  . hydrocortisone (PROCTOCORT) 1 % CREA Use 1 application BID PRN. 30 g 1  . JANUMET 50-1000 MG tablet TAKE 1 TABLET BY MOUTH TWICE A DAY WITH A MEAL 180 tablet 0  . JARDIANCE 25 MG TABS tablet TAKE 1 TABLET BY MOUTH ONCE A DAY 90 tablet 1  . losartan (COZAAR) 50 MG tablet Take 1 tablet (50 mg total) by mouth daily. 90 tablet 3  . Multiple Vitamins-Minerals (MULTIVITAMINS THER. W/MINERALS) TABS Take 1 tablet by mouth daily. Reported on 11/10/2015    . nystatin ointment (MYCOSTATIN) APPLY TO AFFECTED AREAS TWICE DAILY 30 g 0  . pantoprazole (PROTONIX) 40 MG tablet TAKE 1 TABLET BY MOUTH EVERY MORNING 90 tablet 3  . PARoxetine (PAXIL) 20 MG tablet TAKE 1 TABLET BY MOUTH ONCE DAILY 90 tablet 3  . QUEtiapine (SEROQUEL) 50 MG tablet TAKE 2 TABLETS BY MOUTH EVERY NIGHT AT BEDTIME 60 tablet 5   No current facility-administered medications on file prior to visit.   Allergies  Allergen Reactions  . Allergen [Antipyrine-Benzocaine]   . Ciprofloxacin     Nausea   .  Cymbalta [Duloxetine Hcl]   . Doxazosin   . Other     SENSITIVE TO ANTIBIOTICS  . Percocet [Oxycodone-Acetaminophen] Nausea And Vomiting   Social History   Socioeconomic History  . Marital status: Married    Spouse name: Not on file  . Number of children: Not on file  . Years of education: Not on file  . Highest education level: Not on file  Occupational History  . Not on file  Tobacco Use  . Smoking status: Never Smoker  . Smokeless tobacco: Never Used  Substance and Sexual Activity  . Alcohol use: No  . Drug use: No  . Sexual activity: Yes    Birth control/protection: Post-menopausal, Surgical     Comment: HYST  Other Topics Concern  . Not on file  Social History Narrative  . Not on file   Social Determinants of Health   Financial Resource Strain: Not on file  Food Insecurity: Not on file  Transportation Needs: Not on file  Physical Activity: Not on file  Stress: Not on file  Social Connections: Not on file  Intimate Partner Violence: Not on file   Family History  Problem Relation Age of Onset  . Cancer Mother        COLON  . Hypertension Father   . Heart disease Father       Review of Systems  All other systems reviewed and are negative.      Objective:   Physical Exam Vitals reviewed.  Constitutional:      General: She is not in acute distress.    Appearance: She is well-developed. She is not diaphoretic.  HENT:     Head: Normocephalic and atraumatic.     Right Ear: External ear normal.     Left Ear: External ear normal.     Nose: Nose normal.     Mouth/Throat:     Pharynx: No oropharyngeal exudate.  Eyes:     General: No scleral icterus.       Right eye: No discharge.        Left eye: No discharge.     Conjunctiva/sclera: Conjunctivae normal.     Pupils: Pupils are equal, round, and reactive to light.  Neck:     Thyroid: No thyromegaly.     Trachea: No tracheal deviation.  Cardiovascular:     Rate and Rhythm: Normal rate and regular rhythm.     Heart sounds: Normal heart sounds. No murmur heard. No friction rub. No gallop.   Pulmonary:     Effort: Pulmonary effort is normal. No respiratory distress.     Breath sounds: Normal breath sounds. No stridor. No wheezing or rales.  Abdominal:     General: Bowel sounds are normal. There is no distension.     Palpations: Abdomen is soft. There is no mass.     Tenderness: There is no abdominal tenderness. There is no guarding or rebound.  Musculoskeletal:     Cervical back: Normal range of motion and neck supple.  Lymphadenopathy:     Cervical: No cervical adenopathy.  Skin:    General: Skin is  warm.     Coloration: Skin is not pale.     Findings: No erythema or rash.  Neurological:     Mental Status: She is alert and oriented to person, place, and time.     Cranial Nerves: No cranial nerve deficit.     Motor: No abnormal muscle tone.     Coordination: Coordination normal.  Deep Tendon Reflexes: Reflexes normal.  Psychiatric:        Mood and Affect: Mood is anxious.        Speech: Speech normal.        Behavior: Behavior normal.        Thought Content: Thought content normal.        Judgment: Judgment normal.           Assessment & Plan:  Type 2 diabetes mellitus with hyperlipidemia (HCC) - Plan: Lipid panel, Hemoglobin A1c, COMPLETE METABOLIC PANEL WITH GFR, CBC with Differential/Platelet  Colon cancer screening  Benign essential HTN  Stage 3 chronic kidney disease, unspecified whether stage 3a or 3b CKD (HCC)  General medical exam  Anxiety  Osteopenia of femoral neck, bilateral  Check CBC, CMP, lipid panel, A1c.  Schedule the patient for colonoscopy.  She would like to see Dr. Michail Sermon.  Mammogram is up-to-date.  She will schedule her bone density given her history of osteopenia.  Monitor her chronic kidney disease with a CMP.  Immunizations are up-to-date except for the shingles vaccine which I recommended.  She denies any depression or falls or memory loss however she does have a history of falls with fractures to the left leg and I do have some concern about mild short-term memory loss.  Therefore I have encouraged the patient to use the Xanax that she takes as infrequently as possible due to the fall risk and risk for memory loss.  Patient states that she is only taking it perhaps once a day or maybe twice a day at the most.  I encouraged her to continue to try to wean away from that medication

## 2020-08-14 ENCOUNTER — Other Ambulatory Visit: Payer: Self-pay | Admitting: Family Medicine

## 2020-08-20 ENCOUNTER — Telehealth: Payer: Self-pay | Admitting: *Deleted

## 2020-08-20 MED ORDER — PIOGLITAZONE HCL 30 MG PO TABS: 30.0000 mg | ORAL_TABLET | Freq: Every day | ORAL | 1 refills | Status: DC

## 2020-08-20 NOTE — Telephone Encounter (Signed)
Received call from patient for lab results: Hemoglobin A1c shows poor control. Options include Actos versus insulin. Recommend trying Actos 30 mg a day and rechecking labs in 3 months  Patient agreeable to Actos. Prescription sent to pharmacy.

## 2020-09-05 ENCOUNTER — Ambulatory Visit
Admission: RE | Admit: 2020-09-05 | Discharge: 2020-09-05 | Disposition: A | Discharge status: Home / Self Care | Payer: Medicare Other | Source: Ambulatory Visit | Attending: Family Medicine | Admitting: Family Medicine

## 2020-09-05 ENCOUNTER — Other Ambulatory Visit: Payer: Self-pay

## 2020-09-05 ENCOUNTER — Ambulatory Visit (INDEPENDENT_AMBULATORY_CARE_PROVIDER_SITE_OTHER): Payer: Medicare Other | Admitting: Family Medicine

## 2020-09-05 DIAGNOSIS — F419 Anxiety disorder, unspecified: Secondary | ICD-10-CM | POA: Diagnosis not present

## 2020-09-05 DIAGNOSIS — E1169 Type 2 diabetes mellitus with other specified complication: Secondary | ICD-10-CM | POA: Diagnosis not present

## 2020-09-05 DIAGNOSIS — R0989 Other specified symptoms and signs involving the circulatory and respiratory systems: Secondary | ICD-10-CM

## 2020-09-05 DIAGNOSIS — W19XXXA Unspecified fall, initial encounter: Secondary | ICD-10-CM

## 2020-09-05 DIAGNOSIS — R0609 Other forms of dyspnea: Secondary | ICD-10-CM

## 2020-09-05 DIAGNOSIS — I10 Essential (primary) hypertension: Secondary | ICD-10-CM | POA: Diagnosis not present

## 2020-09-05 DIAGNOSIS — E785 Hyperlipidemia, unspecified: Secondary | ICD-10-CM

## 2020-09-05 DIAGNOSIS — R0600 Dyspnea, unspecified: Secondary | ICD-10-CM

## 2020-09-05 DIAGNOSIS — G4700 Insomnia, unspecified: Secondary | ICD-10-CM

## 2020-09-05 MED ORDER — PAROXETINE HCL 20 MG PO TABS
20.0000 mg | ORAL_TABLET | Freq: Every day | ORAL | 3 refills | Status: DC
Start: 2020-09-05 — End: 2021-01-05

## 2020-09-05 MED ORDER — DIAZEPAM 5 MG PO TABS: 5.0000 mg | ORAL_TABLET | Freq: Two times a day (BID) | ORAL | 1 refills | Status: DC | PRN

## 2020-09-05 NOTE — Progress Notes (Signed)
Subjective:    Patient ID: Coletta Memos, female    DOB: 1945/07/18, 75 y.o.   MRN: [id number]  Unfortunately, since the last time I saw the patient, her husband passed away.  Apparently had a heart attack in the hospital and died.  Patient is here today with her daughters who have assumed her care.  She will now be living alone and the daughters are concerned due to polypharmacy.  Therefore they want to go through every medicine on her medication list to know what she is taking the medicines for and to help avoid accidents and confusion.  Patient has been using Xanax 1 to 2 tablets at night to help her sleep.  She also occasionally uses them for anxiety.  However the Xanax is not working.  That is how she got on Seroquel.  She takes 50 mg at night to help her sleep.  Previously she had tried Ambien but had sleepwalking and behavior that she could not remember performing the next day.  Therefore the patient and her family want to avoid medicines such as Ambien and Lunesta and Belsomra for that particular reason.  We have tried trazodone without relief.  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  However the combination of Xanax and Seroquel is no longer working at night.  We went through every medicine on the medication list and is now apparent the patient has not been taking Paxil.  However the remainder of her medications are correct. Immunization History  Administered Date(s) Administered  . Fluad Quad(high Dose 65+) 05/03/2019  . Influenza Split 05/31/2011  . Influenza, High Dose Seasonal PF 04/30/2013, 03/17/2017  . Influenza,inj,Quad PF,6+ Mos 04/21/2015, 05/25/2016  . PFIZER(Purple Top)SARS-COV-2 Vaccination 07/18/2019, 08/08/2019  . Pneumococcal Conjugate-13 08/08/2013, 10/18/2013  . Pneumococcal Polysaccharide-23 05/31/2011    Past Medical History:  Diagnosis Date  . Breast cancer ([place]) 1997   AGE 65, BRCA 1 NEGATIVE 2.  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 CKD (chronic kidney disease), stage III   . Diabetes mellitus    TYPE II  . High cholesterol   . Hypertension    Past Surgical History:  Procedure Laterality Date  . ABDOMINAL HYSTERECTOMY  1985   TAH  . BREAST SURGERY  1997   RIGHT BREAST LUMPECTOMY  . CHOLECYSTECTOMY  1980   Current Outpatient Medications on File Prior to Visit  Medication Sig Dispense Refill  . ALPRAZolam (XANAX) 0.5 MG tablet TAKE 1 TABLET BY MOUTH 4 TIMES DAILY AS.NEEDED. NEED OFFICE VISIT FOR REFILLS. 120 tablet 0  . amLODipine (NORVASC) 5 MG tablet TAKE 1 TABLET BY MOUTH ONCE DAILY 90 tablet 0  . atorvastatin (LIPITOR) 40 MG tablet TAKE 1 TABLET BY MOUTH AT BEDTIME 90 tablet 3  . HYDROcodone-acetaminophen (NORCO/VICODIN) 5-325 MG tablet Take 1 tablet by mouth every 6 (six) hours as needed. 5 tablet 0  . hydrocortisone (PROCTOCORT) 1 % CREA Use 1 application BID PRN. 30 g 1  . JANUMET 50-1000 MG tablet TAKE 1 TABLET BY MOUTH TWICE A DAY WITH A MEAL 180 tablet 0  . JARDIANCE 25 MG TABS tablet TAKE 1 TABLET BY MOUTH ONCE A DAY 90 tablet 1  . losartan (COZAAR) 50 MG tablet Take 1 tablet (50 mg total) by mouth daily. 90 tablet 3  . Multiple Vitamins-Minerals (MULTIVITAMINS THER. W/MINERALS) TABS Take 1 tablet by mouth daily. Reported on 11/10/2015    . nystatin ointment (  MYCOSTATIN) APPLY TO AFFECTED AREAS TWICE DAILY 30 g 0  . pantoprazole (PROTONIX) 40 MG tablet TAKE 1 TABLET BY MOUTH EVERY MORNING 90 tablet 3  . pioglitazone (ACTOS) 30 MG tablet Take 1 tablet (30 mg total) by mouth daily. 90 tablet 1  . QUEtiapine (SEROQUEL) 50 MG tablet TAKE 2 TABLETS BY MOUTH EVERY NIGHT AT BEDTIME 60 tablet 5   No current facility-administered medications on file prior to visit.   Allergies  Allergen Reactions  . Allergen [Antipyrine-Benzocaine]   . Ciprofloxacin     Nausea   . Cymbalta [Duloxetine Hcl]   . Doxazosin   . Other     SENSITIVE TO ANTIBIOTICS  .  Percocet [Oxycodone-Acetaminophen] Nausea And Vomiting   Social History   Socioeconomic History  . Marital status: Married    Spouse name: Not on file  . Number of children: Not on file  . Years of education: Not on file  . Highest education level: Not on file  Occupational History  . Not on file  Tobacco Use  . Smoking status: Never Smoker  . Smokeless tobacco: Never Used  Substance and Sexual Activity  . Alcohol use: No  . Drug use: No  . Sexual activity: Yes    Birth control/protection: Post-menopausal, Surgical    Comment: HYST  Other Topics Concern  . Not on file  Social History Narrative  . Not on file   Social Determinants of Health   Financial Resource Strain: Not on file  Food Insecurity: Not on file  Transportation Needs: Not on file  Physical Activity: Not on file  Stress: Not on file  Social Connections: Not on file  Intimate Partner Violence: Not on file   Family History  Problem Relation Age of Onset  . Cancer Mother        COLON  . Hypertension Father   . Heart disease Father       Review of Systems  All other systems reviewed and are negative.      Objective:   Physical Exam Vitals reviewed.  Constitutional:      General: She is not in acute distress.    Appearance: She is well-developed. She is not diaphoretic.  HENT:     Head: Normocephalic and atraumatic.     Right Ear: External ear normal.     Left Ear: External ear normal.     Nose: Nose normal.     Mouth/Throat:     Pharynx: No oropharyngeal exudate.  Eyes:     General: No scleral icterus.       Right eye: No discharge.        Left eye: No discharge.     Conjunctiva/sclera: Conjunctivae normal.     Pupils: Pupils are equal, round, and reactive to light.  Neck:     Thyroid: No thyromegaly.     Trachea: No tracheal deviation.  Cardiovascular:     Rate and Rhythm: Normal rate and regular rhythm.     Heart sounds: Normal heart sounds. No murmur heard. No friction rub. No  gallop.   Pulmonary:     Effort: Pulmonary effort is normal. No respiratory distress.     Breath sounds: Normal breath sounds. No stridor. No wheezing or rales.  Abdominal:     General: Bowel sounds are normal. There is no distension.     Palpations: Abdomen is soft. There is no mass.     Tenderness: There is no abdominal tenderness. There is no guarding or rebound.  Musculoskeletal:     Cervical back: Normal range of motion and neck supple.  Lymphadenopathy:     Cervical: No cervical adenopathy.  Skin:    General: Skin is warm.     Coloration: Skin is not pale.     Findings: No erythema or rash.  Neurological:     Mental Status: She is alert and oriented to person, place, and time.     Cranial Nerves: No cranial nerve deficit.     Motor: No abnormal muscle tone.     Coordination: Coordination normal.     Deep Tendon Reflexes: Reflexes normal.  Psychiatric:        Mood and Affect: Mood is anxious.        Speech: Speech normal.        Behavior: Behavior normal.        Thought Content: Thought content normal.        Judgment: Judgment normal.           Assessment & Plan:  Bibasilar crackles - Plan: DG Chest 2 View  Type 2 diabetes mellitus with hyperlipidemia (HCC)  Benign essential HTN  Anxiety  Insomnia, unspecified type  Dyspnea on exertion  Daughter states that the patient cannot walk more than 10 feet without becoming winded.  On her exam today there are bibasilar crackles that I have not appreciated before.  I am concerned about Tocco Subu syndrome given the recent death of her husband.  Therefore I will obtain a chest x-ray and if there is pulmonary edema I will discontinue Actos and start the patient on Lasix and proceed with an echocardiogram of the heart.  Blood pressure is elevated today however the patient is extremely anxious due to the recent death of her husband.  She also has not been sleeping.  She has been on Xanax for more than a decade.  I believe  that she is so habituated to the medication that it no longer is helpful.  I do not feel comfortable increasing the Seroquel any further than it already is in for the reasons outlined above we do not want to try Ambien or trazodone or hydroxyzine.  Therefore we will try switching from Xanax to Valium 5 mg p.o. nightly.  She can take up to 10 mg p.o. nightly under the supervision of her daughters.  If the Valium works well hopefully we can get away from the Seroquel.  Also want her to start taking the Paxil as I believe that this will help control some of the anxiety.  Discontinue Xanax.  Arrange home health physical therapy and home health nursing to help come out and check on medication adherence.

## 2020-09-08 ENCOUNTER — Other Ambulatory Visit: Payer: Self-pay | Admitting: Family Medicine

## 2020-09-08 DIAGNOSIS — R0602 Shortness of breath: Secondary | ICD-10-CM

## 2020-09-09 ENCOUNTER — Telehealth: Payer: Self-pay | Admitting: *Deleted

## 2020-09-09 NOTE — Telephone Encounter (Signed)
Received call from patient daughter, Tye [place]. [phone]~ telephone.   Inquired as to status of referral to cardiology.

## 2020-09-10 ENCOUNTER — Telehealth: Payer: Self-pay

## 2020-09-11 NOTE — Telephone Encounter (Signed)
sure

## 2020-09-16 ENCOUNTER — Other Ambulatory Visit: Payer: Self-pay | Admitting: Family Medicine

## 2020-09-16 DIAGNOSIS — G4700 Insomnia, unspecified: Secondary | ICD-10-CM

## 2020-09-19 ENCOUNTER — Other Ambulatory Visit: Payer: Self-pay

## 2020-09-19 DIAGNOSIS — R413 Other amnesia: Secondary | ICD-10-CM

## 2020-09-29 ENCOUNTER — Telehealth: Payer: Self-pay

## 2020-09-30 NOTE — Telephone Encounter (Signed)
I ordered home health.  I ordered it 09/05/20 at her last OV.  Can we check on status of referral.

## 2020-09-30 NOTE — Addendum Note (Signed)
Addended by: Jenna Luo T on: 09/30/2020 06:48 AM   Modules accepted: Orders

## 2020-10-01 ENCOUNTER — Telehealth: Payer: Self-pay | Admitting: Family Medicine

## 2020-10-01 NOTE — Telephone Encounter (Signed)
Received call from [facility] to follow up on referral to [facility] for physical therapy; she asked for Anitra.   Please advise at [phone]

## 2020-10-03 ENCOUNTER — Other Ambulatory Visit: Payer: Self-pay

## 2020-10-03 ENCOUNTER — Ambulatory Visit (HOSPITAL_COMMUNITY): Payer: Medicare Other | Attending: Cardiology

## 2020-10-03 DIAGNOSIS — R0602 Shortness of breath: Secondary | ICD-10-CM | POA: Insufficient documentation

## 2020-10-03 LAB — ECHOCARDIOGRAM COMPLETE
AR max vel: 2.34 cm2
AV Area VTI: 2.16 cm2
AV Area mean vel: 2.11 cm2
AV Mean grad: 8.5 mmHg
AV Peak grad: 14.7 mmHg
Ao pk vel: 1.92 m/s
Area-P 1/2: 3.12 cm2
S' Lateral: 1.9 cm

## 2020-10-06 ENCOUNTER — Telehealth: Payer: Self-pay | Admitting: *Deleted

## 2020-10-06 NOTE — Telephone Encounter (Signed)
Received call from [place], [facility] SN with [facility].   Reports that start of care was completed over weekend. Requested following orders: 1. HH SN 1x weekly x3 weeks for medication education- patient will be getting medications prepackaged.  2. HH OT to eval and treat as indicated for bath safety.   VO given.

## 2020-10-06 NOTE — Telephone Encounter (Signed)
Orders have been received

## 2020-10-07 ENCOUNTER — Other Ambulatory Visit: Payer: Self-pay | Admitting: Family Medicine

## 2020-10-07 ENCOUNTER — Telehealth: Payer: Self-pay | Admitting: Family Medicine

## 2020-10-07 MED ORDER — BELSOMRA 10 MG PO TABS: 10.0000 mg | ORAL_TABLET | Freq: Every evening | ORAL | 0 refills | Status: DC | PRN

## 2020-10-07 NOTE — Telephone Encounter (Signed)
[name] with [facility] called to request information about this mutual patient. Needs xray results, an updated med list, A1C results in past year, and wants to know if patient is supposed to checking her blood glucose level. Please advise [name] at [phone]

## 2020-10-07 NOTE — Telephone Encounter (Signed)
Call placed to [name], [name] with [facility] [phone]~ telephone.  Kathryn Wall

## 2020-10-08 NOTE — Telephone Encounter (Signed)
Received call from Shauna Hugh [facility] SN with [facility] [phone]~ telephone.  Requested VO for HH SN 1x weekly x4 weeks for medication management and education on PRN medications.   Also requested VO for [facility] OT.   VOs given.

## 2020-10-09 ENCOUNTER — Telehealth: Payer: Self-pay | Admitting: *Deleted

## 2020-10-09 ENCOUNTER — Other Ambulatory Visit: Payer: Self-pay | Admitting: Family Medicine

## 2020-10-09 MED ORDER — BELSOMRA 10 MG PO TABS: 10.0000 mg | ORAL_TABLET | Freq: Every evening | ORAL | 0 refills | Status: DC | PRN

## 2020-10-09 NOTE — Telephone Encounter (Signed)
Incident occurred on 10/07/2020 where patient daughter requested sleep aid for herself, and due to miscommunication, PCP reviewed this patient chart and prescribed Belsomra.   Medication was obtained from pharmacy by patient, but she had not taken medication due to concerns.   Received calls from patient daughter, and POA Tye [place]. Inquired as to why this prescription was sent. Advised that there was a miscommunication, and an error occurred. Advised that all relevant parties have been notified of error.   Reports that patient is experiencing difficulty with going to sleep and staying asleep.  Inquired as to if patient can try the medication. Also inquired as to if patient should continue Seroquel if she takes Belsomra.   PCP reviewed information and advised medication is appropriate for patient. Advised to stop Seroquel.   Call placed to patient daughter Tye [place] and she was made aware. Verbalized understanding.

## 2020-10-13 ENCOUNTER — Ambulatory Visit: Payer: Medicare Other

## 2020-10-21 NOTE — Telephone Encounter (Signed)
Patient had echocardiogram done on 10/03/20

## 2020-10-22 ENCOUNTER — Other Ambulatory Visit: Payer: Self-pay | Admitting: Family Medicine

## 2020-10-22 NOTE — Telephone Encounter (Signed)
Ok to refill Diazepam??  Last office visit/ refill 09/05/2020.

## 2020-10-23 ENCOUNTER — Other Ambulatory Visit: Payer: Self-pay | Admitting: *Deleted

## 2020-10-23 LAB — HM DEXA SCAN

## 2020-10-23 MED ORDER — MELOXICAM 15 MG PO TABS: 15.0000 mg | ORAL_TABLET | Freq: Every day | ORAL | 0 refills | Status: DC

## 2020-10-24 ENCOUNTER — Telehealth: Payer: Self-pay

## 2020-10-24 ENCOUNTER — Other Ambulatory Visit: Payer: Self-pay

## 2020-10-29 LAB — HM DIABETES EYE EXAM

## 2020-10-31 ENCOUNTER — Telehealth: Payer: Self-pay | Admitting: Family Medicine

## 2020-10-31 NOTE — Telephone Encounter (Signed)
No results have been noted at this time.

## 2020-10-31 NOTE — Telephone Encounter (Signed)
Pt called wanting to know about the results of her bone density test.   Please call [phone]

## 2020-11-05 ENCOUNTER — Encounter: Payer: Self-pay | Admitting: *Deleted

## 2020-11-05 NOTE — Telephone Encounter (Signed)
Received bone density results as follows: Osteopenia with significant decrease in R hip total  Call placed to patient and patient daughter made aware. Advised to continue taking Calcium 1200mg  and Vitamin D 1000IU daily. Continue weight bearing exercises, exercises that make you work against gravity while staying upright such as walking, running or swimming to help prevent bone density loss.

## 2020-11-10 ENCOUNTER — Other Ambulatory Visit: Payer: Self-pay | Admitting: Family Medicine

## 2020-11-12 ENCOUNTER — Telehealth: Payer: Self-pay | Admitting: *Deleted

## 2020-11-12 NOTE — Telephone Encounter (Signed)
Patient came to office to leave urine specimen for possible UTI. Patient states that PCP advised she could drop off sample at any time.   Advised front office that patient will need OV for documentation and billing purposes. Patient was notified of need for appointment and was very difficult with front staff.   Received following message from front office staff after patient left office: FYI - rather than schedule an appt, she said she's going to look for another provider and just go to the nearest urgent care center.

## 2020-11-13 ENCOUNTER — Other Ambulatory Visit: Payer: Self-pay | Admitting: Family Medicine

## 2020-11-13 DIAGNOSIS — R29898 Other symptoms and signs involving the musculoskeletal system: Secondary | ICD-10-CM

## 2020-11-13 DIAGNOSIS — F4329 Adjustment disorder with other symptoms: Secondary | ICD-10-CM

## 2020-11-13 DIAGNOSIS — F4381 Prolonged grief disorder: Secondary | ICD-10-CM

## 2020-12-05 NOTE — Telephone Encounter (Signed)
Noted  

## 2020-12-09 ENCOUNTER — Other Ambulatory Visit: Payer: Self-pay | Admitting: Family Medicine

## 2020-12-09 ENCOUNTER — Other Ambulatory Visit: Payer: Self-pay

## 2020-12-09 ENCOUNTER — Ambulatory Visit: Payer: Medicare Other | Attending: Family Medicine

## 2020-12-09 DIAGNOSIS — R296 Repeated falls: Secondary | ICD-10-CM | POA: Diagnosis present

## 2020-12-09 DIAGNOSIS — R42 Dizziness and giddiness: Secondary | ICD-10-CM | POA: Insufficient documentation

## 2020-12-09 DIAGNOSIS — R269 Unspecified abnormalities of gait and mobility: Secondary | ICD-10-CM | POA: Insufficient documentation

## 2020-12-09 DIAGNOSIS — R2681 Unsteadiness on feet: Secondary | ICD-10-CM | POA: Insufficient documentation

## 2020-12-09 DIAGNOSIS — R2689 Other abnormalities of gait and mobility: Secondary | ICD-10-CM

## 2020-12-09 DIAGNOSIS — M6281 Muscle weakness (generalized): Secondary | ICD-10-CM

## 2020-12-09 DIAGNOSIS — R262 Difficulty in walking, not elsewhere classified: Secondary | ICD-10-CM | POA: Insufficient documentation

## 2020-12-09 DIAGNOSIS — R278 Other lack of coordination: Secondary | ICD-10-CM | POA: Diagnosis present

## 2020-12-09 NOTE — Therapy (Deleted)
[place] MAIN [facility] SERVICES [address] [place], [place], [address] Phone: [phone]   Fax:  [phone]  Physical Therapy Evaluation  Patient Details  Name: Kathryn Wall MRN: [id number] Date of Birth: 10/26/1945 No data recorded  Encounter Date: 12/09/2020    Past Medical History:  Diagnosis Date   Breast cancer ([place]) 1997   AGE 75, BRCA 1 NEGATIVE 2. 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    CKD (chronic kidney disease), stage III    Diabetes mellitus    TYPE II   High cholesterol    Hypertension     Past Surgical History:  Procedure Laterality Date   [place]   RIGHT BREAST LUMPECTOMY   CHOLECYSTECTOMY  1980    There were no vitals filed for this visit.    SUBJECTIVE Chief complaint: refer to hx/subjective: Pt has recurrent low back pain and has had "shots in her back" pt reports she has arthritis  Onset: at least one year ago  Recent changes in overall health/medication: No Directional pattern for falls: "I fall but I always end up backwards" Prior history of physical therapy for balance: yes, Home Health  Follow-up appointment with MD: none  Red flags (bowel/bladder changes, saddle paresthesia, personal history of cancer, chills/fever, night sweats, unrelenting pain) Negative Pt reports sometimes she doesn't make it to the bathroom in time (she reports this is not new) Pt reports prior hx of breast cancer   OBJECTIVE  MUSCULOSKELETAL: Tremor: Absent Bulk: Normal Tone: Normal, no clonus  Posture Elevated and rounded shoulders, forward head posture.  Gait No gross abnormalities in gait noted  Strength R/L Grossly 4-/5 B   Sensation Grossly intact to light touch bilateral UEs/LEs as determined by testing dermatomes C2-T2/L2-S2 respectively Proprioception and hot/cold testing deferred on this date   NEUROLOGICAL:  Mental  Status Patient is oriented to person, place and time.  Recent memory is intact.  Remote memory is intact.  Attention span and concentration are intact.  Expressive speech is intact.  Patient's fund of knowledge is within normal limits for educational level.   Coordination/Cerebellar Finger to Nose: WNL Heel to Shin: WNL Rapid alternating movements: WNL Finger Opposition: WNL Pronator Drift: Negative   FUNCTIONAL OUTCOME MEASURES   Results Comments  BERG 26/56 Fall risk, in need of intervention  5TSTS 22.86 seconds Indicates increased fall risk  10 Meter Gait Speed 0.638 m/s  Below normative values for full community ambulation  ABC Scale 23.75% Score indicates impairment  DHI 36/100 Difficultly with bending over, walking in the dark, housework, yard work, turning over in bed, head movements, ambitious activities, restricting social activities, getting in and out of bed, looking up   FOTO: 42 (goal: 50)    ASSESSMENT Clinical Impression: Pt is a pleasant 75 year-old female referred for B LE weakness. Upon examination pt  PT examination reveals deficits in strength, ROM, balance and gait. PT outcome measures also indicated some handicap due to dizziness, and decreased balance confidence as well as impaired perceived functional mobility and QOL. Pt presents with deficits in strength, gait and balance. Pt will benefit from skilled PT services to address deficits in balance and decrease risk for future falls.    PLAN Next Visit: HEP: Access Code: [email or web address] URL: [email or web address] Date: 12/09/2020 Prepared by: Ricard Dillon  Exercises Sit to Stand with Counter Support - 1 x daily - 4 x weekly - 3  sets - 10 reps    Pt will be independent with HEP in order to improve strength and balance in order to decrease fall risk and improve function at home and work.   Pt will improve BERG by at least 3 points in order to demonstrate clinically significant improvement in  balance.    Pt will improve DGI by at least 3 points in order to demonstrate clinically significant improvement in balance and decreased risk for falls.  Pt will improve ABC by at least 13% in order to demonstrate clinically significant improvement in balance confidence.   Pt will decrease 5TSTS by at least 3 seconds in order to demonstrate clinically significant improvement in LE strength.  Pt will decrease TUG to below 14 seconds/decrease in order to demonstrate decreased fall risk.  Pt will decrease DHI score by at least 18 points in order to demonstrate clinically significant reduction in disability   Pt will increase 6MWT by at least 58m(165f in order to demonstrate clinically significant improvement in cardiopulmonary endurance and community ambulation                  Objective measurements completed on examination: See above findings.                             Patient will benefit from skilled therapeutic intervention in order to improve the following deficits and impairments:     Visit Diagnosis: No diagnosis found.     Problem List Patient Active Problem List   Diagnosis Date Noted   Osteopenia of femoral neck, bilateral 10/15/2016   Breast cancer of lower-outer quadrant of right female breast ([facility]10/11/2013   CKD (chronic kidney disease), stage III ([facility]   Essential hypertension, benign 08/14/2012   Hypercholesteremia 08/14/2012   Diabetes ([facility]02/17/2014   Pyelonephritis 05/29/2011   Constipation 05/29/2011   AKI (acute kidney injury) ([facility]12/06/2010    HaZollie Pee/14/2022, [address] [id number]: [phone] Fax:  [phone]: CaJAKEISHA STRICKERRN: [id number] of Birth: [birth year]

## 2020-12-10 ENCOUNTER — Other Ambulatory Visit: Payer: Self-pay | Admitting: Family Medicine

## 2020-12-10 NOTE — Telephone Encounter (Signed)
[facility] requesting a refill of this med diazepam (VALIUM) 5 MG tablet.  Cb#: [phone]

## 2020-12-10 NOTE — Telephone Encounter (Signed)
Ok to refill??  Last office visit 09/05/2020.  Last refill 10/23/2020.

## 2020-12-10 NOTE — Therapy (Signed)
[place] MAIN [facility] SERVICES [address] [place], [place], [address] Phone: [phone]   Fax:  [phone]  Physical Therapy Evaluation  Patient Details  Name: Kathryn Wall MRN: [id number] Date of Birth: [birth year] Referring Provider (PT): [name], MD   Encounter Date: 12/09/2020   PT End of Session - 12/10/20 0857     Visit Number 1    Number of Visits 25    Date for PT Re-Evaluation 03/03/21    Authorization Type eval performed on 12/09/2020    PT Start Time 1114    PT Stop Time 1220    PT Time Calculation (min) 66 min    Equipment Utilized During Treatment Gait belt    Activity Tolerance Patient tolerated treatment well    Behavior During Therapy WFL for tasks assessed/performed             Past Medical History:  Diagnosis Date   Breast cancer ([place]) 1997   AGE 10, BRCA 1 NEGATIVE 2. 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    CKD (chronic kidney disease), stage III ([place])    Diabetes mellitus    TYPE II   High cholesterol    Hypertension     Past Surgical History:  Procedure Laterality Date   [place]    There were no vitals filed for this visit.    Subjective Assessment - 12/09/20 1115     Subjective Pt presents to PT examination with her daughter. Pt in transport chair. Pt reports her main concern is weakness, especially in her LEs and how it has impacted her ability to walk and safely perform activities. Pt reports problems began approximately one year ago after she fell and had to have surgery placing screws and plate in L ankle. She reports after surgery she "wasn't able to walk well." She reports weakness, balance, and difficulty walking worsened during this time since her husband was sick and she reports they were sedentary. She reports her husband passed away on 09-11-2022 and her  ability to walk has continued to decrease. Pt has fallen 3 times in past few months. She reports she tends to fall backwards and hit her head with some of her falls. Pt reports she did not follow-up after falls with her doctor. She reports she has also been feeling dizzy "for at least a month." She describes dizziness as spinning that lasts for seconds. She says onset of spinning occurs with positional changes such as sitting up from laying down in bed or in her recliner. She thinks dizziness may have started after one of her falls. When pt asked about mechanism for falls she states, "I just lose my balance." Pt reports she uses a RW when ambulating in her home.    Patient is accompained by: Family member    Pertinent History Per chart pt is a pleasant 75 y.o. female with a PMH that includes diabetes, CKD, hypertension, high cholesterol, osteopenia, cholecystectomy, hysterectomy, breast cancer. Per pt she reports history of recurrent low back pain. Pt presents to PT exam due to reports of weakness, and decreasing ability to ambulate with reports of 3 falls in the past 6 months. Pt also reports she experiences dizziness where the room appears to spin.    Limitations Walking;Standing;House hold activities;Lifting    How long can you sit comfortably? unaffected  How long can you stand comfortably? 5 minutes    How long can you walk comfortably? Pt reports about 5 minutes with RW after which she needs to sit down.    Diagnostic tests DEXA on 10/23/2020    Patient Stated Goals Pt would like to walk without a cane and to be able to get up out of the chair    Currently in Pain? No/denies                [facility] PT Assessment - 12/09/20 1125       Assessment   Referring Provider (PT) [name], MD    Onset Date/Surgical Date --   1 year ago   Hand Dominance Right    Next MD Visit not scheduled    Prior Therapy home health PT   to treat LE weakness     Precautions   Precautions Fall       Restrictions   Weight Bearing Restrictions No      Balance Screen   Has the patient fallen in the past 6 months Yes    How many times? 3    Has the patient had a decrease in activity level because of a fear of falling?  No    Is the patient reluctant to leave their home because of a fear of falling?  No      Home Environment   Living Environment Private residence    Living Arrangements Alone    Available Help at Discharge Family    Type of [place] to enter    Entrance Stairs-Number of Steps 4    Entrance Stairs-Rails Right;Left;Can reach both    [place] One level    [place] - 4 wheels;Cane - single point;Shower seat;Toilet riser;Bedside commode   has rail on side of bed to pull herself up     Prior Function   Level of Independence Independent with basic ADLs   but did not go to grocery store to shop     Cognition   Overall Cognitive Status Within Functional Limits for tasks assessed      Standardized Balance Assessment   Standardized Balance Assessment Berg Balance Test      Berg Balance Test   Sit to Stand Able to stand  independently using hands    Standing Unsupported Able to stand safely 2 minutes    Sitting with Back Unsupported but Feet Supported on Floor or Stool Able to sit safely and securely 2 minutes    Stand to Sit Sits independently, has uncontrolled descent    Transfers Able to transfer safely, definite need of hands    Standing Unsupported with Eyes Closed Needs help to keep from falling    Standing Unsupported with Feet Together Able to place feet together independently and stand for 1 minute with supervision    From Standing, Reach Forward with Outstretched Arm Can reach forward >5 cm safely (2")    From Standing Position, Pick up Object from Floor Able to pick up shoe, needs supervision    From Standing Position, Turn to Look Behind Over each Shoulder Needs supervision when turning    Turn 360 Degrees Able to turn  360 degrees safely but slowly    Standing Unsupported, Alternately Place Feet on Step/Stool Needs assistance to keep from falling or unable to try    Standing Unsupported, One Foot in [employer] balance while stepping or standing  Standing on One Leg Unable to try or needs assist to prevent fall    Total Score 26            Exam  SUBJECTIVE Chief complaint: refer to hx/subjective: see history/subjective  Onset: at least one year ago  Recent changes in overall health/medication: No Directional pattern for falls: "I fall but I always end up backwards" Prior history of physical therapy for balance: yes, Home Health  Follow-up appointment with MD: none  Red flags (bowel/bladder changes, saddle paresthesia, personal history of cancer, chills/fever, night sweats, unrelenting pain) Negative, except for prior hx of breast cancer Pt reports sometimes she doesn't make it to the bathroom in time (she reports this is not new). PT instructed pt to follow-up with her doctor regarding incontinence and pt and daughter verbalized understanding.    OBJECTIVE  MUSCULOSKELETAL: Tremor: Absent Tone: Normal, no clonus  Posture Elevated and rounded shoulders, forward head posture.  Gait Decreased gait speed (see below), decreased step-length, heel strike B. Poor eccentric control of ant. Tib B. Increased UE weightbearing through RW.  Strength R/L Grossly 4-/5 B   Sensation Grossly intact to light touch bilateral UEs/LEs as determined by testing dermatomes C2-T2/L2-S2 respectively Proprioception and hot/cold testing deferred on this date   NEUROLOGICAL:   Coordination/Cerebellar Finger to Nose: WNL Heel to Shin: WNL Rapid alternating movements: WNL  FUNCTIONAL OUTCOME MEASURES   Results Comments  BERG 26/56 Fall risk, in need of intervention  5TSTS 22.86 seconds Indicates increased fall risk  10 Meter Gait Speed 0.638 m/s  Below normative values for full community ambulation   ABC Scale 23.75% Score indicates impairment  DHI 36/100 Difficultly with bending over, walking in the dark, housework, yard work, turning over in bed, head movements, ambitious activities, restricting social activities, getting in and out of bed, looking up   FOTO: 42 (goal: 50)  Other education/insruction: PT instructed pt and pt's daughter to follow-up with her doctor due to falls where pt hit her head. Also instructed pt to follow-up any time in the future shoulder this occur. Pt and daughter verbalize understanding.   ASSESSMENT Clinical Impression: Pt is a pleasant 75 year-old female referred for B LE weakness. Examination reveals deficits in strength, ROM, balance, gait, and presence of dizziness as evidenced by MMT, 5xSTS, Berg, 10MWT and pt hx and observation on this date. Pt outcome measures, DHI, ABC scale, and FOTO, also indicate perceived handicap due to dizziness, and decreased balance confidence as well as impaired perceived functional mobility and QOL. Further vestibular assessment to be completed future session due to reports of perceived spinning-dizziness. Pt will benefit from skilled PT services to address deficits strength, gait, ROM, dizziness and balance to decrease fall risk and increase QOL.   PLAN Next Visit: HEP: Access Code: [email or web address] URL: [email or web address] Date: 12/09/2020 Prepared by: Ricard Dillon  Exercises Sit to Stand with Counter Support - 1 x daily - 4 x weekly - 3 sets - 10 reps    Objective measurements completed on examination: See above findings.       PT Education - 12/10/20 0856     Education Details Pt educated on exam findings, indications for PT/POC, HEP    Person(s) Educated Patient;Child(ren)    Methods Explanation;Demonstration;Tactile cues;Verbal cues;Handout    Comprehension Verbalized understanding;Returned demonstration              PT Short Term Goals - 12/09/20 1138       PT SHORT TERM GOAL #1   Title  Pt will be independent with HEP in order to improve strength and balance in order to decrease fall risk and improve function at home and work.    Baseline 6/14: issued inital HEP of STS with counter support, UE assist    Time 6    Period Weeks    Status New    Target Date 01/20/21               PT Long Term Goals - 12/09/20 1138       PT LONG TERM GOAL #1   Title Pt will decrease DHI score by at least 18 points in order to demonstrate clinically significant reduction in disability    Baseline 6/14: 36/100    Time 12    Period Weeks    Status New    Target Date 03/03/21      PT LONG TERM GOAL #2   Title Pt will improve ABC by at least 13% in order to demonstrate clinically significant improvement in balance confidence.    Baseline 6/14: 23.75%    Time 12    Period Weeks    Status New    Target Date 03/03/21      PT LONG TERM GOAL #3   Title Pt will decrease 5TSTS by at least 3 seconds in order to demonstrate clinically significant improvement in LE strength.    Baseline 6/14: 22.86 sec with use of BUEs    Time 12    Period Weeks    Status New    Target Date 03/03/21      PT LONG TERM GOAL #4   Title Patient will increase FOTO score to equal to or greater than  50   to demonstrate statistically significant improvement in mobility and quality of life.    Baseline 6/14: 42    Time 12    Period Weeks    Status New    Target Date 03/03/21      PT LONG TERM GOAL #5   Title Pt will improve BERG by at least 3 points in order to demonstrate clinically significant improvement in balance.    Baseline 6/14: 26/5    Time 12    Period Weeks    Status New    Target Date 03/03/21      Additional Long Term Goals   Additional Long Term Goals Yes      PT LONG TERM GOAL #6   Title Patient will increase 10 meter walk test to >1.75ms as to improve gait speed for better community ambulation and to reduce fall risk.    Baseline 6/14: 0.638 m/s with RW    Time 12    Period Weeks     Status New    Target Date 03/03/21                    Plan - 12/09/20 1133     Clinical Impression Statement Pt is a pleasant 74year-old female referred for B LE weakness. Examination reveals deficits in strength, ROM, balance, gait, and presence of dizziness as evidenced by MMT, 5xSTS, Berg, 10MWT and pt hx and observation on this date. Pt outcome measures, DHI, ABC scale, and FOTO, also indicate perceived handicap due to dizziness, and decreased balance confidence as well as impaired perceived functional mobility and QOL. Further vestibular assessment to be completed future session due to reports of perceived spinning-dizziness. Pt will benefit from skilled PT services to address deficits strength, gait, ROM, dizziness and balance to  decrease fall risk and increase QOL.    Personal Factors and Comorbidities Age;Comorbidity 1;Comorbidity 2;Comorbidity 3+;Fitness;Sex;Social Background;Time since onset of injury/illness/exacerbation    Comorbidities DMII with hyperlipidemia, HTN, stage 3 chronic kidney disease, anxiety, osteopenia of femoral neck B, reports of recurrent low back pain, hx of breast cancer and hysterectomy    Examination-Activity Limitations Bathing;Carry;Lift;Stand;Bed Mobility;Continence;Locomotion Level;Bend;Dressing;Reach Overhead;Squat;Transfers;Hygiene/Grooming;Stairs;Caring for Others    Examination-Participation Restrictions Cleaning;Community Activity;Laundry;Shop;Yard Work;Medication Management;Meal Prep    Stability/Clinical Decision Making Evolving/Moderate complexity    Clinical Decision Making Moderate    Rehab Potential Good    PT Frequency 2x / week    PT Duration 12 weeks    PT Treatment/Interventions ADLs/Self Care Home Management;Canalith Repostioning;Cryotherapy;Biofeedback;Electrical Stimulation;Moist Heat;Traction;Ultrasound;DME Instruction;Gait training;Stair training;Functional mobility training;Therapeutic activities;Therapeutic exercise;Balance  training;Neuromuscular re-education;Patient/family education;Orthotic Fit/Training;Wheelchair mobility training;Manual techniques;Passive range of motion;Energy conservation;Splinting;Taping;Vestibular;Visual/perceptual remediation/compensation;Joint Manipulations;Dry needling;Spinal Manipulations    PT Next Visit Plan further vestibular assessment, continue to add to HEP    PT Home Exercise Plan HEP: Access Code: [email or web address]    Consulted and Agree with Plan of Care Patient;Family member/caregiver    Family Member Consulted daughter             Patient will benefit from skilled therapeutic intervention in order to improve the following deficits and impairments:  Abnormal gait, Decreased activity tolerance, Decreased endurance, Decreased range of motion, Decreased strength, Dizziness, Hypomobility, Increased fascial restricitons, Improper body mechanics, Decreased balance, Decreased mobility, Difficulty walking, Impaired flexibility, Postural dysfunction, Obesity, Pain, Increased muscle spasms  Visit Diagnosis: Muscle weakness (generalized)  Other abnormalities of gait and mobility  Unsteadiness on feet     Problem List Patient Active Problem List   Diagnosis Date Noted   Osteopenia of femoral neck, bilateral 10/15/2016   Breast cancer of lower-outer quadrant of right female breast ([place]) 04/02/2014   CKD (chronic kidney disease), stage III ([place])    Essential hypertension, benign 08/14/2012   Hypercholesteremia 08/14/2012   Diabetes ([place]) 08/14/2012   Pyelonephritis 05/29/2011   Constipation 05/29/2011   AKI (acute kidney injury) ([place]) 05/29/2011   Ricard Dillon PT, DPT 12/10/2020, 10:35 AM  [place] MAIN [facility] SERVICES [address] [place], [place], [address] Phone: [phone]   Fax:  [phone]  Name: Kathryn Wall MRN: [id number] Date of Birth: 07-26-1945

## 2020-12-10 NOTE — Telephone Encounter (Signed)
Ok to refill??  Last office visit 09/05/2020.  Last refill 10/23/2020.  Ok to add refills to prescription?

## 2020-12-11 ENCOUNTER — Ambulatory Visit: Payer: Medicare Other

## 2020-12-11 ENCOUNTER — Other Ambulatory Visit: Payer: Self-pay

## 2020-12-11 DIAGNOSIS — R278 Other lack of coordination: Secondary | ICD-10-CM

## 2020-12-11 DIAGNOSIS — R296 Repeated falls: Secondary | ICD-10-CM

## 2020-12-11 DIAGNOSIS — R269 Unspecified abnormalities of gait and mobility: Secondary | ICD-10-CM

## 2020-12-11 DIAGNOSIS — M6281 Muscle weakness (generalized): Secondary | ICD-10-CM

## 2020-12-11 DIAGNOSIS — R262 Difficulty in walking, not elsewhere classified: Secondary | ICD-10-CM

## 2020-12-11 MED ORDER — DIAZEPAM 5 MG PO TABS: ORAL_TABLET | ORAL | 0 refills | Status: DC

## 2020-12-11 NOTE — Therapy (Signed)
[place] MAIN [facility] SERVICES [address] [place], [place], [address] Phone: [phone]   Fax:  [phone]  Physical Therapy Treatment  Patient Details  Name: Kathryn Wall MRN: [id number] Date of Birth: July 16, 1945 Referring Provider (PT): [name], MD   Encounter Date: 12/11/2020   PT End of Session - 12/11/20 1642     Visit Number 2    Number of Visits 25    Date for PT Re-Evaluation 03/03/21    Authorization Type eval performed on 12/09/2020    PT Start Time 1530    PT Stop Time 1615    PT Time Calculation (min) 45 min    Equipment Utilized During Treatment Gait belt    Activity Tolerance Patient tolerated treatment well    Behavior During Therapy WFL for tasks assessed/performed             Past Medical History:  Diagnosis Date   Breast cancer ([place]) 1997   AGE 71, BRCA 1 NEGATIVE 2. 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    CKD (chronic kidney disease), stage III ([place])    Diabetes mellitus    TYPE II   High cholesterol    Hypertension     Past Surgical History:  Procedure Laterality Date   [place]   RIGHT BREAST LUMPECTOMY   CHOLECYSTECTOMY  1980    Vitals: 140/77 mmHg Left UE sitting at rest; HR- 83 bpm.  BP= 128/84 mmHg Standing- Asymptomatic  Instructed patient in importance of adherence to a home exercise program for maximium benefit and rehab potential.   Patient performed the following seated therex:   Hip march Hip abd with red TB Knee ext Resistive ham curls Toe raises Heel raises Patient performed x 10 reps bilaterally and denied any left knee pain with therex today. Education provided throughout session via VC/TC and demonstration to facilitate movement at target joints and correct muscle activation for all testing and exercises performed.     Access Code: [email or web address] URL: [email or web address] Date:  12/11/2020 Prepared by: Sande Brothers, PT  Exercises Seated Hip Flexion March with Ankle Weights - 1 x daily - 5 x weekly - 3 sets - 10 reps - 2 hold Seated Isometric Hip Abduction with Belt - 1 x daily - 5 x weekly - 3 sets - 10 reps - 2 hold Seated Hamstring Curl with Anchored Resistance - 1 x daily - 5 x weekly - 3 sets - 10 reps - 2 hold Seated Long Arc Quad - 1 x daily - 5 x weekly - 3 sets - 10 reps - 2 hold Seated Toe Raise - 1 x daily - 5 x weekly - 3 sets - 10 reps - 2 hold Seated Heel Raise - 1 x daily - 5 x weekly - 3 sets - 10 reps - 2 hold   Subjective Assessment - 12/11/20 1639     Subjective Patient reports doing okay today- brought in a cane- denied any pain but did report a fall yesterday- tripped over her walker per her report and bruised her left knee. She states she did not call her MD or ED and states doing okay today.    Patient is accompained by: Family member    Pertinent History Per chart pt is a pleasant 75 y.o. female with a PMH that includes diabetes, CKD, hypertension, high cholesterol, osteopenia, cholecystectomy, hysterectomy, breast cancer. Per pt she reports  history of recurrent low back pain. Pt presents to PT exam due to reports of weakness, and decreasing ability to ambulate with reports of 3 falls in the past 6 months. Pt also reports she experiences dizziness where the room appears to spin.    Limitations Walking;Standing;House hold activities;Lifting    How long can you sit comfortably? unaffected    How long can you stand comfortably? 5 minutes    How long can you walk comfortably? Pt reports about 5 minutes with RW after which she needs to sit down.    Diagnostic tests DEXA on 10/23/2020    Patient Stated Goals Pt would like to walk without a cane and to be able to get up out of the chair    Currently in Pain? No/denies             Clinical Impression: Patient performed well with all seated therex. She was initially apprehensive about a home  program stating she was not really interested in learning exercises for home. Spent some time reinforcing importance of home program for max benefit and patient retracted and stated she would be okay with instruction and try to be compliant. She performed very well overall toady and able to accomplish 10 reps each LE without report of pain or difficulty. Added seated therex to her home program and issued handout without incidence. Pt will continue to benefit from skilled PT services to address deficits strength, gait, ROM, dizziness and balance to decrease fall risk and increase QOL.                          PT Education - 12/11/20 1641     Education Details Home program for LE-Access Code: [email or web address]    Person(s) Educated Patient    Methods Explanation;Demonstration;Tactile cues;Verbal cues    Comprehension Verbalized understanding;Returned demonstration;Verbal cues required;Tactile cues required              PT Short Term Goals - 12/09/20 1138       PT SHORT TERM GOAL #1   Title Pt will be independent with HEP in order to improve strength and balance in order to decrease fall risk and improve function at home and work.    Baseline 6/14: issued inital HEP of STS with counter support, UE assist    Time 6    Period Weeks    Status New    Target Date 01/20/21               PT Long Term Goals - 12/09/20 1138       PT LONG TERM GOAL #1   Title Pt will decrease DHI score by at least 18 points in order to demonstrate clinically significant reduction in disability    Baseline 6/14: 36/100    Time 12    Period Weeks    Status New    Target Date 03/03/21      PT LONG TERM GOAL #2   Title Pt will improve ABC by at least 13% in order to demonstrate clinically significant improvement in balance confidence.    Baseline 6/14: 23.75%    Time 12    Period Weeks    Status New    Target Date 03/03/21      PT LONG TERM GOAL #3   Title Pt will decrease 5TSTS by  at least 3 seconds in order to demonstrate clinically significant improvement in LE strength.    Baseline 6/14: 22.86 sec  with use of BUEs    Time 12    Period Weeks    Status New    Target Date 03/03/21      PT LONG TERM GOAL #4   Title Patient will increase FOTO score to equal to or greater than  50   to demonstrate statistically significant improvement in mobility and quality of life.    Baseline 6/14: 42    Time 12    Period Weeks    Status New    Target Date 03/03/21      PT LONG TERM GOAL #5   Title Pt will improve BERG by at least 3 points in order to demonstrate clinically significant improvement in balance.    Baseline 6/14: 26/5    Time 12    Period Weeks    Status New    Target Date 03/03/21      Additional Long Term Goals   Additional Long Term Goals Yes      PT LONG TERM GOAL #6   Title Patient will increase 10 meter walk test to >1.14ms as to improve gait speed for better community ambulation and to reduce fall risk.    Baseline 6/14: 0.638 m/s with RW    Time 12    Period Weeks    Status New    Target Date 03/03/21                   Plan - 12/11/20 1651     Clinical Impression Statement Patient performed well with all seated therex. She was initially apprehensive about a home program stating she was not really interested in learning exercises for home. Spent some time reinforcing importance of home program for max benefit and patient retracted and stated she would be okay with instruction and try to be compliant. She performed very well overall toady and able to accomplish 10 reps each LE without report of pain or difficulty. Added seated therex to her home program and issued handout without incidence. Pt will continue to benefit from skilled PT services to address deficits strength, gait, ROM, dizziness and balance to decrease fall risk and increase QOL.    Personal Factors and Comorbidities Age;Comorbidity 1;Comorbidity 2;Comorbidity  3+;Fitness;Sex;Social Background;Time since onset of injury/illness/exacerbation    Comorbidities DMII with hyperlipidemia, HTN, stage 3 chronic kidney disease, anxiety, osteopenia of femoral neck B, reports of recurrent low back pain, hx of breast cancer and hysterectomy    Examination-Activity Limitations Bathing;Carry;Lift;Stand;Bed Mobility;Continence;Locomotion Level;Bend;Dressing;Reach Overhead;Squat;Transfers;Hygiene/Grooming;Stairs;Caring for Others    Examination-Participation Restrictions Cleaning;Community Activity;Laundry;Shop;Yard Work;Medication Management;Meal Prep    Stability/Clinical Decision Making Evolving/Moderate complexity    Rehab Potential Good    PT Frequency 2x / week    PT Duration 12 weeks    PT Treatment/Interventions ADLs/Self Care Home Management;Canalith Repostioning;Cryotherapy;Biofeedback;Electrical Stimulation;Moist Heat;Traction;Ultrasound;DME Instruction;Gait training;Stair training;Functional mobility training;Therapeutic activities;Therapeutic exercise;Balance training;Neuromuscular re-education;Patient/family education;Orthotic Fit/Training;Wheelchair mobility training;Manual techniques;Passive range of motion;Energy conservation;Splinting;Taping;Vestibular;Visual/perceptual remediation/compensation;Joint Manipulations;Dry needling;Spinal Manipulations    PT Next Visit Plan further vestibular assessment, continue to add to HEP    PT Home Exercise Plan Access Code: [email or web address]  URL: [email or web address]    Consulted and Agree with Plan of Care Patient;Family member/caregiver             Patient will benefit from skilled therapeutic intervention in order to improve the following deficits and impairments:  Abnormal gait, Decreased activity tolerance, Decreased endurance, Decreased range of motion, Decreased strength, Dizziness, Hypomobility, Increased fascial restricitons, Improper body mechanics, Decreased balance, Decreased mobility, Difficulty  walking, Impaired  flexibility, Postural dysfunction, Obesity, Pain, Increased muscle spasms  Visit Diagnosis: Abnormality of gait and mobility  Difficulty in walking, not elsewhere classified  Muscle weakness (generalized)  Other lack of coordination  Repeated falls     Problem List Patient Active Problem List   Diagnosis Date Noted   Osteopenia of femoral neck, bilateral 10/15/2016   Breast cancer of lower-outer quadrant of right female breast ([place]) 04/02/2014   CKD (chronic kidney disease), stage III ([place])    Essential hypertension, benign 08/14/2012   Hypercholesteremia 08/14/2012   Diabetes ([place]) 08/14/2012   Pyelonephritis 05/29/2011   Constipation 05/29/2011   AKI (acute kidney injury) ([place]) 05/29/2011    Lewis Moccasin, PT 12/11/2020, 4:52 PM  [place] MAIN [facility] SERVICES [address] [place], [place], [address] Phone: [phone]   Fax:  [phone]  Name: Kathryn Wall MRN: [id number] Date of Birth: Jun 21, 1946

## 2020-12-16 ENCOUNTER — Ambulatory Visit: Payer: Medicare Other | Admitting: Neurology

## 2020-12-16 ENCOUNTER — Other Ambulatory Visit: Payer: Self-pay

## 2020-12-16 ENCOUNTER — Encounter: Payer: Self-pay | Admitting: Neurology

## 2020-12-16 ENCOUNTER — Ambulatory Visit: Payer: Medicare Other

## 2020-12-16 VITALS — BP 116/78 | HR 88 | Ht 65.0 in | Wt 174.3 lb

## 2020-12-16 DIAGNOSIS — R0683 Snoring: Secondary | ICD-10-CM

## 2020-12-16 DIAGNOSIS — G4719 Other hypersomnia: Secondary | ICD-10-CM | POA: Diagnosis not present

## 2020-12-16 DIAGNOSIS — R419 Unspecified symptoms and signs involving cognitive functions and awareness: Secondary | ICD-10-CM | POA: Diagnosis not present

## 2020-12-16 DIAGNOSIS — R2689 Other abnormalities of gait and mobility: Secondary | ICD-10-CM

## 2020-12-16 DIAGNOSIS — M6281 Muscle weakness (generalized): Secondary | ICD-10-CM | POA: Diagnosis not present

## 2020-12-16 DIAGNOSIS — R351 Nocturia: Secondary | ICD-10-CM

## 2020-12-16 DIAGNOSIS — R42 Dizziness and giddiness: Secondary | ICD-10-CM

## 2020-12-16 DIAGNOSIS — E669 Obesity, unspecified: Secondary | ICD-10-CM

## 2020-12-16 DIAGNOSIS — G479 Sleep disorder, unspecified: Secondary | ICD-10-CM | POA: Diagnosis not present

## 2020-12-16 DIAGNOSIS — E66811 Obesity, class 1: Secondary | ICD-10-CM

## 2020-12-16 DIAGNOSIS — R2681 Unsteadiness on feet: Secondary | ICD-10-CM

## 2020-12-16 DIAGNOSIS — R4789 Other speech disturbances: Secondary | ICD-10-CM

## 2020-12-16 NOTE — Patient Instructions (Signed)
You have complaints of memory loss: memory loss or changes in cognitive function can have many reasons and does not always mean you have dementia. Conditions that can contribute to subjective or objective memory loss include: depression, stress, poor sleep from insomnia or sleep apnea, dehydration, fluctuation in blood sugar values, thyroid or electrolyte dysfunction and certain vitamin deficiencies. Dementia can be caused by stroke, brain atherosclerosis or brain vascular disease due to vascular risk factors (smoking, high blood pressure, high cholesterol, obesity and uncontrolled diabetes), certain degenerative brain disorders (including Parkinson's disease and Multiple sclerosis) and by Alzheimer's disease or other, more rare and sometimes hereditary causes. We will do some additional testing:   As you have not had any recent blood work except for a few tests in February 2022, I would like to proceed with blood work today.    As discussed, we will do a brain scan, called MRI and call you with the test results. We will have to schedule you for this on a separate date. This test requires authorization from your insurance, and we will take care of the insurance process.   Based on your symptoms and your exam I believe you are at risk for obstructive sleep apnea (aka OSA), and I think we should proceed with a sleep study to determine whether you do or do not have OSA and how severe it is. Even, if you have mild OSA, I may want you to consider treatment with CPAP, as treatment of even borderline or mild sleep apnea can result and improvement of symptoms such as sleep disruption, daytime sleepiness, nighttime bathroom breaks, restless leg symptoms, improvement of headache syndromes, even improved mood disorder.   An attended sleep study meaning you get to stay overnight in the sleep lab, lets [place] monitor sleep-related behaviors such as sleep talking and leg movements in sleep, in addition to monitoring for sleep  apnea.  A home sleep test is a screening tool for sleep apnea only, and unfortunately does not help with any other sleep-related diagnoses.  Please remember, the long-term risks and ramifications of untreated moderate to severe obstructive sleep apnea are: increased Cardiovascular disease, including congestive heart failure, stroke, difficult to control hypertension, treatment resistant obesity, arrhythmias, especially irregular heartbeat commonly known as A. Fib. (atrial fibrillation); even type 2 diabetes has been linked to untreated OSA.   Sleep apnea can cause disruption of sleep and sleep deprivation in most cases, which, in turn, can cause recurrent headaches, problems with memory, mood, concentration, focus, and vigilance. Most people with untreated sleep apnea report excessive daytime sleepiness, which can affect their ability to drive. Please do not drive if you feel sleepy. Patients with sleep apnea can also develop difficulty initiating and maintaining sleep (aka insomnia).   We will plan a follow-up after testing.  We will keep you posted as to your test results by phone call as well.

## 2020-12-16 NOTE — Therapy (Signed)
[place] MAIN [facility] SERVICES [address] [place], [place], [address] Phone: [phone]   Fax:  [phone]  Physical Therapy Treatment  Patient Details  Name: DAWNN NAM MRN: [id number] Date of Birth: Dec 07, 1945 Referring Provider (PT): [name], MD   Encounter Date: 12/16/2020   PT End of Session - 12/16/20 1704     Visit Number 3    Number of Visits 25    Date for PT Re-Evaluation 03/03/21    Authorization Type eval performed on 12/09/2020    PT Start Time 1110    PT Stop Time 1145    PT Time Calculation (min) 35 min    Equipment Utilized During Treatment Gait belt    Activity Tolerance Patient tolerated treatment well    Behavior During Therapy WFL for tasks assessed/performed             Past Medical History:  Diagnosis Date   Breast cancer ([place]) 1997   AGE 38, BRCA 1 NEGATIVE 2. 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    CKD (chronic kidney disease), stage III ([place])    Diabetes mellitus    TYPE II   High cholesterol    Hypertension     Past Surgical History:  Procedure Laterality Date   [place]    There were no vitals filed for this visit.   Subjective Assessment - 12/16/20 1111     Subjective Pt reports she fell Friday night on her back porch going up the steps. "I don't know why I was just down." Pt's nephew had to come over to help her up. Pt has scab on L knee. She reports she had swollen L ankle but it's gone down. She reports she doesn't feel injured and she didn't call her doctor. Pt says "it's just sore."    Patient is accompained by: Family member    Pertinent History Per chart pt is a pleasant 75 y.o. female with a PMH that includes diabetes, CKD, hypertension, high cholesterol, osteopenia, cholecystectomy, hysterectomy, breast cancer. Per pt she reports history of recurrent  low back pain. Pt presents to PT exam due to reports of weakness, and decreasing ability to ambulate with reports of 3 falls in the past 6 months. Pt also reports she experiences dizziness where the room appears to spin.    Limitations Walking;Standing;House hold activities;Lifting    How long can you sit comfortably? unaffected    How long can you stand comfortably? 5 minutes    How long can you walk comfortably? Pt reports about 5 minutes with RW after which she needs to sit down.    Diagnostic tests DEXA on 10/23/2020    Patient Stated Goals Pt would like to walk without a cane and to be able to get up out of the chair    Currently in Pain? No/denies              Treatment-  Neuro Re-Ed: CGA-max assist throughout  [facility] 2x testing R side. Pt initially rates dizziness 7/10 with first test. Initial presentation of R torsional, upbeating nystagmus of less than one minute.   R Epley maneuver 1x  R Semont 1x (attempted d/t pt mobility limitations/requiring increased level of assist).  By end of assessment pt exhibited what appeared to be horizontal nystagmus. Requires further assessment. Unable to perform this  session d/t time.   Pt requires min-max assist for maneuvers, particularly with coming from a supine>side-lying>seated position.     PT Short Term Goals - 12/09/20 1138       PT SHORT TERM GOAL #1   Title Pt will be independent with HEP in order to improve strength and balance in order to decrease fall risk and improve function at home and work.    Baseline 6/14: issued inital HEP of STS with counter support, UE assist    Time 6    Period Weeks    Status New    Target Date 01/20/21               PT Long Term Goals - 12/09/20 1138       PT LONG TERM GOAL #1   Title Pt will decrease DHI score by at least 18 points in order to demonstrate clinically significant reduction in disability    Baseline 6/14: 36/100    Time 12    Period Weeks    Status New     Target Date 03/03/21      PT LONG TERM GOAL #2   Title Pt will improve ABC by at least 13% in order to demonstrate clinically significant improvement in balance confidence.    Baseline 6/14: 23.75%    Time 12    Period Weeks    Status New    Target Date 03/03/21      PT LONG TERM GOAL #3   Title Pt will decrease 5TSTS by at least 3 seconds in order to demonstrate clinically significant improvement in LE strength.    Baseline 6/14: 22.86 sec with use of BUEs    Time 12    Period Weeks    Status New    Target Date 03/03/21      PT LONG TERM GOAL #4   Title Patient will increase FOTO score to equal to or greater than  50   to demonstrate statistically significant improvement in mobility and quality of life.    Baseline 6/14: 42    Time 12    Period Weeks    Status New    Target Date 03/03/21      PT LONG TERM GOAL #5   Title Pt will improve BERG by at least 3 points in order to demonstrate clinically significant improvement in balance.    Baseline 6/14: 26/5    Time 12    Period Weeks    Status New    Target Date 03/03/21      Additional Long Term Goals   Additional Long Term Goals Yes      PT LONG TERM GOAL #6   Title Patient will increase 10 meter walk test to >1.2ms as to improve gait speed for better community ambulation and to reduce fall risk.    Baseline 6/14: 0.638 m/s with RW    Time 12    Period Weeks    Status New    Target Date 03/03/21                   Plan - 12/16/20 1705     Clinical Impression Statement Vestibular testing perfomed on this date as pt reports continued "spinning" dizziness. PT initially performed Dix-Hallpike to R side, where pt exhbitied what appeared to be R torsional, upbeat nystagmus. Pt rated dizziness 7/10 with testing. PT brought pt immediately through Epley manuever to treat R side. Initially during manuever pt reported improvement in dizziness, however,  upon sitting up reported increased dizziness. PT retested pt R  Dix-Hallpike and attempted semont to treat R side d/t pt requiring up to mod a for mobility with maneuver. However, pt exhibited R beating horizontal nystagmus, consistent with possible conversion to horizontal canal. PT eduated pt on activity modification and to avoid activities that are challenging to her balance for remainder of day. Pt verbalized understanding. Unable to further assess d/t time limitations. Roll testing to be performed next session to detrmine if there is involvment of horizontal canal. The pt will benefit from further skilled PT to address strength, gait and balance and dizziness-related symptoms in order to decrease fall risk.    Personal Factors and Comorbidities Age;Comorbidity 1;Comorbidity 2;Comorbidity 3+;Fitness;Sex;Social Background;Time since onset of injury/illness/exacerbation    Comorbidities DMII with hyperlipidemia, HTN, stage 3 chronic kidney disease, anxiety, osteopenia of femoral neck B, reports of recurrent low back pain, hx of breast cancer and hysterectomy    Examination-Activity Limitations Bathing;Carry;Lift;Stand;Bed Mobility;Continence;Locomotion Level;Bend;Dressing;Reach Overhead;Squat;Transfers;Hygiene/Grooming;Stairs;Caring for Others    Examination-Participation Restrictions Cleaning;Community Activity;Laundry;Shop;Yard Work;Medication Management;Meal Prep    Stability/Clinical Decision Making Evolving/Moderate complexity    Rehab Potential Good    PT Frequency 2x / week    PT Duration 12 weeks    PT Treatment/Interventions ADLs/Self Care Home Management;Canalith Repostioning;Cryotherapy;Biofeedback;Electrical Stimulation;Moist Heat;Traction;Ultrasound;DME Instruction;Gait training;Stair training;Functional mobility training;Therapeutic activities;Therapeutic exercise;Balance training;Neuromuscular re-education;Patient/family education;Orthotic Fit/Training;Wheelchair mobility training;Manual techniques;Passive range of motion;Energy  conservation;Splinting;Taping;Vestibular;Visual/perceptual remediation/compensation;Joint Manipulations;Dry needling;Spinal Manipulations    PT Next Visit Plan further vestibular assessment, continue to add to HEP, roll testing    PT Home Exercise Plan Access Code: [email or web address]  URL: [email or web address]    Consulted and Agree with Plan of Care Patient;Family member/caregiver             Patient will benefit from skilled therapeutic intervention in order to improve the following deficits and impairments:  Abnormal gait, Decreased activity tolerance, Decreased endurance, Decreased range of motion, Decreased strength, Dizziness, Hypomobility, Increased fascial restricitons, Improper body mechanics, Decreased balance, Decreased mobility, Difficulty walking, Impaired flexibility, Postural dysfunction, Obesity, Pain, Increased muscle spasms  Visit Diagnosis: Dizziness and giddiness  Unsteadiness on feet  Other abnormalities of gait and mobility  Muscle weakness (generalized)     Problem List Patient Active Problem List   Diagnosis Date Noted   Osteopenia of femoral neck, bilateral 10/15/2016   Breast cancer of lower-outer quadrant of right female breast ([place]) 04/02/2014   CKD (chronic kidney disease), stage III ([place])    Essential hypertension, benign 08/14/2012   Hypercholesteremia 08/14/2012   Diabetes ([place]) 08/14/2012   Pyelonephritis 05/29/2011   Constipation 05/29/2011   AKI (acute kidney injury) ([place]) 05/29/2011   Ricard Dillon PT, DPT  12/16/2020, 5:20 PM  [place] MAIN [facility] SERVICES [address] [place], [place], [address] Phone: [phone]   Fax:  [phone]  Name: ANGELMARIE PONZO MRN: [id number] Date of Birth: May 08, 1946

## 2020-12-16 NOTE — Progress Notes (Signed)
Subjective:    Patient ID: Kathryn Wall is a 75 y.o. female.  HPI    Kathryn Age, MD, PhD [facility] [address], [address] [place], [place] [address]  Dear Dr. Dennard Schaumann,   I saw your patient, Kathryn Wall, upon your kind request, in my Neurologic clinic today for initial consultation of her cognitive concerns.  The patient is accompanied by her daughter, Kathryn Wall, today.  As you know, Ms. Kathryn Wall is a 75 year old right-handed woman with an underlying medical history of diabetes, hypertension, hyperlipidemia, insomnia, chronic kidney disease, breast cancer, with status post lumpectomy, and overweight state, who reports difficulty with word finding for the past 6 to 8 months.  She is not as such forgetful and does not feel that her memory is impaired but she has had mild confusion and words come out just wrong or slurred at times.  She has not had any sudden onset of one-sided weakness or numbness or tingling or droopy face or recurrent headaches.  No family history of dementia.  Mom lived to be 76 years old and dad died at Wall 8.  She had 2 brothers, 1 passed away at Wall 90 from leukemia, her other brother is 24 years old and has no memory issues.   She has 2 grown daughters, Kathryn Wall lives next door and Kathryn Wall lives about 6 miles away.  Patient is a non-smoker, she tries to hydrate well, averages about 2-3 bottles of water per day, no alcohol.  She is trying to switch to caffeine free tea and soda.  I reviewed your office note from 09/05/2020.  She recently lost her husband.  She has been in physical therapy for difficulty walking.  She was switched recently from Xanax to Valium for her sleep issues.  Previously she tried Ambien but had side effects including sleepwalking.  She has been on Seroquel as well.  She had a brain MRI without contrast on 07/18/2012 for indication of dizziness, fall 2 weeks ago, remote history of breast cancer, and I reviewed the  results:  IMPRESSION:  1.  No acute intracranial abnormality.  2.  Mild for Wall nonspecific signal changes in the cerebral white  matter and pons, most commonly due to chronic small vessel disease.  3.  Chronic mild left mastoid effusion.  She had blood work through your office in February 2022 and I reviewed the results.  CMP showed BUN of 16, creatinine 1.11, otherwise unremarkable.  Lipid panel showed total cholesterol of 148, HDL 37, triglycerides elevated at 340.  A1c was 7.9.  She has been in physical therapy for the past week.  She has not been very active physically and has had weakness in both legs.  She has fallen.  At home she uses a walker and she brought a cane today.  Sadly, she lost her husband in March 2022.  She is holding up okay, she is supposed to start grief counseling in July.  She was switched from Xanax to Valium, she is using 5 mg of Valium at bedtime but daughter is concerned that it does make her groggy.  She goes to bed late by habit.  She is typically in bed around 2 AM or 3 AM and may sleep till 2 PM.  She has nocturia about 4 times per average night and denies recurrent morning headaches.  She does not have a family history of sleep apnea.  She does snore.  She is not fully rested and does endorse tiredness  during the day.   Her Past Medical History Is Significant For: Past Medical History:  Diagnosis Date   Breast cancer ([place]) 1997   Wall 54, BRCA 1 NEGATIVE 2. 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    CKD (chronic kidney disease), stage III (HCC)    Diabetes mellitus    TYPE II   High cholesterol    Hypertension     Her Past Surgical History Is Significant For: Past Surgical History:  Procedure Laterality Date   [place]   RIGHT BREAST LUMPECTOMY   CHOLECYSTECTOMY  1980    Her Family History Is Significant For: Family History  Problem Relation Wall of Onset   Cancer Mother         COLON   Hypertension Father    Heart disease Father     Her Social History Is Significant For: Social History   Socioeconomic History   Marital status: Married    Spouse name: Not on file   Number of children: Not on file   Years of education: Not on file   Highest education level: Not on file  Occupational History   Not on file  Tobacco Use   Smoking status: Never   Smokeless tobacco: Never  Substance and Sexual Activity   Alcohol use: No   Drug use: No   Sexual activity: Yes    Birth control/protection: Post-menopausal, Surgical    Comment: HYST  Other Topics Concern   Not on file  Social History Narrative   Not on file   Social Determinants of Health   Financial Resource Strain: Not on file  Food Insecurity: Not on file  Transportation Needs: Not on file  Physical Activity: Not on file  Stress: Not on file  Social Connections: Not on file    Her Allergies Are:  Allergies  Allergen Reactions   Allergen [Antipyrine-Benzocaine]    Ciprofloxacin     Nausea    Cymbalta [Duloxetine Hcl]    Doxazosin    Other     SENSITIVE TO ANTIBIOTICS   Percocet [Oxycodone-Acetaminophen] Nausea And Vomiting  :   Her Current Medications Are:  Outpatient Encounter Medications as of 12/16/2020  Medication Sig   amLODipine (NORVASC) 5 MG tablet TAKE 1 TABLET BY MOUTH ONCE DAILY   Ascorbic Acid (VITA-C PO) Take by mouth.   atorvastatin (LIPITOR) 40 MG tablet TAKE 1 TABLET BY MOUTH AT BEDTIME   diazepam (VALIUM) 5 MG tablet TAKE 1 TABLET BY MOUTH ONCE DAILY EVERY 12 HOURS AS NEEDED FOR ANXIETY (SLEEP). STOP XANAX   JANUMET 50-1000 MG tablet TAKE 1 TABLET BY MOUTH TWICE A DAY WITH A MEAL   JARDIANCE 25 MG TABS tablet TAKE 1 TABLET BY MOUTH ONCE A DAY   losartan (COZAAR) 50 MG tablet TAKE 1 TABLET BY MOUTH ONCE A DAY   meloxicam (MOBIC) 15 MG tablet Take 1 tablet (15 mg total) by mouth daily.   nystatin ointment (MYCOSTATIN) APPLY TO AFFECTED AREAS TWICE DAILY   pantoprazole  (PROTONIX) 40 MG tablet TAKE 1 TABLET BY MOUTH EVERY MORNING   PARoxetine (PAXIL) 20 MG tablet Take 1 tablet (20 mg total) by mouth daily.   pioglitazone (ACTOS) 30 MG tablet Take 1 tablet (30 mg total) by mouth daily.   VITAMIN D PO Take by mouth.   [DISCONTINUED] ALPRAZolam (XANAX) 0.5 MG tablet TAKE 1 TABLET BY MOUTH 4 TIMES DAILY AS.NEEDED. NEED OFFICE VISIT FOR REFILLS.   [  DISCONTINUED] Multiple Vitamins-Minerals (MULTIVITAMINS THER. W/MINERALS) TABS Take 1 tablet by mouth daily. Reported on 11/10/2015   [DISCONTINUED] Suvorexant (BELSOMRA) 10 MG TABS Take 10 mg by mouth at bedtime as needed. Stop hydroxyzine   No facility-administered encounter medications on file as of 12/16/2020.  :   Review of Systems:  Out of a complete 14 point review of systems, all are reviewed and negative with the exception of these symptoms as listed below:  Review of Systems  Neurological:        Here for consult on worsening memory and word finding.  Reports several falls over the last 2-3 month. Sts she has hit her head when she has fallen.   Objective:  Neurological Exam  Physical Exam Physical Examination:   Vitals:   12/16/20 1353  BP: 116/78  Pulse: 88    General Examination: The patient is a very pleasant 75 y.o. female in no acute distress. She appears well-developed and well-nourished and well groomed.   HEENT: Normocephalic, atraumatic, pupils are equal, round and reactive to light, extraocular tracking is fairly well-preserved, hearing is grossly intact with tuning fork.  Face is symmetric with normal facial animation to light touch, temperature and vibration.  Airway examination reveals a small airway, tonsillar size of about 1+, neck circumference 16-1/2 inches.  Tongue protrudes centrally and palate elevates symmetrically, no carotid bruits.   Chest: Clear to auscultation without wheezing, rhonchi or crackles noted.  Heart: S1+S2+0, regular and normal without murmurs, rubs or gallops  noted.   Abdomen: Soft, non-tender and non-distended with normal bowel sounds appreciated on auscultation.  Extremities: There is no pitting edema in the distal lower extremities bilaterally. Pedal pulses are intact.  Skin: Warm and dry without trophic changes noted.  Musculoskeletal: exam reveals no obvious joint deformities, mild bruise left knee, left lateral ankle with surgical scar.    Neurologically:  Mental status: The patient is awake, alert and oriented in all 4 spheres. Her immediate and remote memory, attention, language skills and fund of knowledge are appropriate. There is no evidence of aphasia, agnosia, apraxia or anomia. Speech is clear with normal prosody and enunciation. Thought process is linear. Mood is normal and affect is blunted.   On 12/16/2020: MMSE: 29/30 (she lost one-point on serial sevens), CDT: 4/4, AFT: 12/min.  Cranial nerves II - XII are as described above under HEENT exam. In addition: shoulder shrug is normal with equal shoulder height noted. Motor exam: Thin bulk, global strength of 5 out of 5 in the upper extremities and 4+ out of 5 in the lower extremities with mild weakness noted particularly in both hip flexors.  There is no drift, tremor or rebound. Romberg is not tested due to safety concerns.  Reflexes are 1+ throughout. Babinski: Toes are flexor bilaterally. Fine motor skills and coordination: intact with normal finger taps, normal hand movements, normal rapid alternating patting, normal foot taps and normal foot agility.  Cerebellar testing: No dysmetria or intention tremor on finger to nose testing. Heel to shin is unremarkable bilaterally. There is no truncal or gait ataxia.  Sensory exam: intact to light touch, vibration, temperature sense in the upper and lower extremities.  Gait, station and balance: She stands with mild difficulty and pushes herself up.  She naturally stands a little wide-based.  She uses a single-point cane and walks slowly and  cautiously.  No shuffling noted.    Assessment and Plan:   In summary, AZAELA CARACCI is a very pleasant 75 y.o.-year old  female with an underlying medical history of diabetes, hypertension, hyperlipidemia, insomnia, chronic kidney disease, breast cancer, with status post lumpectomy, and overweight state, who presents for evaluation of her cognitive complaints particularly word switching and word finding difficulty in the past 6 to 8 months.  She has had quite a bit of stress in the recent past, she took care of her husband who passed away in 09-22-20.  She is dealing with the loss of her partner and is going to start grief therapy.  She had fairly benign memory scores today and it is reassuring.  Nevertheless, she does have vascular risk factors.  She does not have a telltale family history of Alzheimer's dementia.  I would like to proceed with additional work-up to rule out any treatable causes of her memory complaints.  We will proceed with laboratory testing today in the form of blood work.  I would like for her to have a brain MRI with and without contrast if possible, without contrast if kidney function does not allow for safe contrast administration.  We will keep her posted as to her test results.  Furthermore, we talked about her sleep difficulty and the possibility of underlying obstructive sleep apnea as she does report snoring and nocturia and not feeling fully rested during the day.  Of note, she has never had a sleep study but was encouraged at one point to pursue sleep testing but did not want to consider a CPAP machine.  She would be willing to consider treatment at this time if the need arises.  For now, we will plan a follow-up after testing.  We will call her to schedule her sleep study as well as her MRI soon.  We talked about the importance of healthy lifestyle and good hydration.  We also talked about fall prevention.  She is encouraged to continue to work on strengthening exercises  and continue with physical therapy.  I answered all the questions today and the patient and her daughter were in agreement.  Thank you very much for allowing me to participate in the care of this nice patient. If I can be of any further assistance to you please do not hesitate to call me at [phone].  Sincerely,   Kathryn Age, MD, PhD

## 2020-12-17 ENCOUNTER — Telehealth: Payer: Self-pay | Admitting: Neurology

## 2020-12-17 NOTE — Telephone Encounter (Signed)
MRI brain w/wo contrast U[employer] medicare auth: NPR ref # [id number]  Sent to GI for scheduling

## 2020-12-18 ENCOUNTER — Telehealth: Payer: Self-pay

## 2020-12-18 ENCOUNTER — Ambulatory Visit: Payer: Medicare Other

## 2020-12-18 ENCOUNTER — Other Ambulatory Visit: Payer: Self-pay

## 2020-12-18 DIAGNOSIS — R768 Other specified abnormal immunological findings in serum: Secondary | ICD-10-CM

## 2020-12-18 DIAGNOSIS — R269 Unspecified abnormalities of gait and mobility: Secondary | ICD-10-CM

## 2020-12-18 DIAGNOSIS — R278 Other lack of coordination: Secondary | ICD-10-CM

## 2020-12-18 DIAGNOSIS — R262 Difficulty in walking, not elsewhere classified: Secondary | ICD-10-CM

## 2020-12-18 DIAGNOSIS — M6281 Muscle weakness (generalized): Secondary | ICD-10-CM | POA: Diagnosis not present

## 2020-12-18 NOTE — Therapy (Signed)
[place] MAIN [facility] [address] [place], [place], [address] Phone: [phone]   Fax:  [phone]  Physical Therapy Treatment  Patient Details  Name: RHYLEN PULIDO MRN: [id number] Date of Birth: 1945-07-13 Referring Provider (PT): [name], MD   Encounter Date: 12/18/2020   PT End of Session - 12/22/20 1252     Visit Number 4    Number of Visits 25    Date for PT Re-Evaluation 03/03/21    Authorization Type eval performed on 12/09/2020    PT Start Time 1532    PT Stop Time 0254    PT Time Calculation (min) 43 min    Equipment Utilized During Treatment Gait belt    Activity Tolerance Patient tolerated treatment well    Behavior During Therapy WFL for tasks assessed/performed             Past Medical History:  Diagnosis Date   Breast cancer ([place]) 1997   AGE 6, BRCA 1 NEGATIVE 2. 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    CKD (chronic kidney disease), stage III ([place])    Diabetes mellitus    TYPE II   High cholesterol    Hypertension     Past Surgical History:  Procedure Laterality Date   [place]   RIGHT BREAST LUMPECTOMY   CHOLECYSTECTOMY  1980    BP= 134/78 mmHg; HR=82 bpm   Subjective Assessment - 12/22/20 1251     Subjective Pt reports she fell Friday night on her back porch going up the steps. "I don't know why I was just down." Pt's nephew had to come over to help her up. Pt has scab on L knee. She reports she had swollen L ankle but it's gone down. She reports she doesn't feel injured and she didn't call her doctor. Pt says "it's just sore."    Patient is accompained by: Family member    Pertinent History Per chart pt is a pleasant 75 y.o. female with a PMH that includes diabetes, CKD, hypertension, high cholesterol, osteopenia, cholecystectomy, hysterectomy, breast cancer. Per pt she reports history of recurrent low back pain. Pt  presents to PT exam due to reports of weakness, and decreasing ability to ambulate with reports of 3 falls in the past 6 months. Pt also reports she experiences dizziness where the room appears to spin.    Limitations Walking;Standing;House hold activities;Lifting    How long can you sit comfortably? unaffected    How long can you stand comfortably? 5 minutes    How long can you walk comfortably? Pt reports about 5 minutes with RW after which she needs to sit down.    Diagnostic tests DEXA on 10/23/2020    Patient Stated Goals Pt would like to walk without a cane and to be able to get up out of the chair    Currently in Pain? No/denies                Interventions:  Therex:  Instructed patient in standing LE strengthening at support bar:  Standing Hip march Standing hip ext Standing hip abd Standing knee flex Standing mini squat Standing heel raises Patient performed 10 reps BLE and Education provided throughout session via VC/TC and demonstration to facilitate movement at target joints and correct muscle activation for all testing and exercises performed.   Clinical Impression: Patient performed all therex today well with instruction (  VC, TC and visual demo) and denied any dizziness during entire treatment. Patient able to return demonstration well without report of any pain as well. Pt will continue to benefit from skilled PT services to address deficits strength, gait, ROM, dizziness and balance to decrease fall risk and increase QOL.     Access Code: [email or web address] URL: [email or web address] Date: 12/18/2020 Prepared by: Sande Brothers, PT  Exercises Standing March with Counter Support - 1 x daily - 3 x weekly - 3 sets - 10 reps - 2 hold Standing Hip Abduction with Counter Support - 1 x daily - 3 x weekly - 3 sets - 10 reps - 2 hold Standing Hip Extension with Counter Support - 1 x daily - 3 x weekly - 3 sets - 10 reps - 2 hold Standing Knee Flexion with  Counter Support - 1 x daily - 3 x weekly - 3 sets - 10 reps - 2 hold Mini Squat with Counter Support - 1 x daily - 3 x weekly - 3 sets - 10 reps - 2 hold Heel rises with counter support - 1 x daily - 3 x weekly - 3 sets - 10 reps - 2 hold                  PT Education - 12/22/20 1251     Education Details exercise technique    Person(s) Educated Patient    Methods Explanation;Demonstration;Tactile cues;Verbal cues;Handout    Comprehension Verbalized understanding;Returned demonstration;Verbal cues required;Need further instruction;Tactile cues required              PT Short Term Goals - 12/09/20 1138       PT SHORT TERM GOAL #1   Title Pt will be independent with HEP in order to improve strength and balance in order to decrease fall risk and improve function at home and work.    Baseline 6/14: issued inital HEP of STS with counter support, UE assist    Time 6    Period Weeks    Status New    Target Date 01/20/21               PT Long Term Goals - 12/09/20 1138       PT LONG TERM GOAL #1   Title Pt will decrease DHI score by at least 18 points in order to demonstrate clinically significant reduction in disability    Baseline 6/14: 36/100    Time 12    Period Weeks    Status New    Target Date 03/03/21      PT LONG TERM GOAL #2   Title Pt will improve ABC by at least 13% in order to demonstrate clinically significant improvement in balance confidence.    Baseline 6/14: 23.75%    Time 12    Period Weeks    Status New    Target Date 03/03/21      PT LONG TERM GOAL #3   Title Pt will decrease 5TSTS by at least 3 seconds in order to demonstrate clinically significant improvement in LE strength.    Baseline 6/14: 22.86 sec with use of BUEs    Time 12    Period Weeks    Status New    Target Date 03/03/21      PT LONG TERM GOAL #4   Title Patient will increase FOTO score to equal to or greater than  50   to demonstrate statistically significant  improvement in mobility and quality of life.  Baseline 6/14: 42    Time 12    Period Weeks    Status New    Target Date 03/03/21      PT LONG TERM GOAL #5   Title Pt will improve BERG by at least 3 points in order to demonstrate clinically significant improvement in balance.    Baseline 6/14: 26/5    Time 12    Period Weeks    Status New    Target Date 03/03/21      Additional Long Term Goals   Additional Long Term Goals Yes      PT LONG TERM GOAL #6   Title Patient will increase 10 meter walk test to >1.61ms as to improve gait speed for better community ambulation and to reduce fall risk.    Baseline 6/14: 0.638 m/s with RW    Time 12    Period Weeks    Status New    Target Date 03/03/21                   Plan - 12/22/20 1252     Clinical Impression Statement Patient performed all therex today well with instruction (VC, TC and visual demo) and denied any dizziness during entire treatment. Patient able to return demonstration well without report of any pain as well. Pt will continue to benefit from skilled PT services to address deficits strength, gait, ROM, dizziness and balance to decrease fall risk and increase QOL.    Personal Factors and Comorbidities Age;Comorbidity 1;Comorbidity 2;Comorbidity 3+;Fitness;Sex;Social Background;Time since onset of injury/illness/exacerbation    Comorbidities DMII with hyperlipidemia, HTN, stage 3 chronic kidney disease, anxiety, osteopenia of femoral neck B, reports of recurrent low back pain, hx of breast cancer and hysterectomy    Examination-Activity Limitations Bathing;Carry;Lift;Stand;Bed Mobility;Continence;Locomotion Level;Bend;Dressing;Reach Overhead;Squat;Transfers;Hygiene/Grooming;Stairs;Caring for Others    Examination-Participation Restrictions Cleaning;Community Activity;Laundry;Shop;Yard Work;Medication Management;Meal Prep    Stability/Clinical Decision Making Evolving/Moderate complexity    Rehab Potential Good     PT Frequency 2x / week    PT Duration 12 weeks    PT Treatment/Interventions ADLs/Self Care Home Management;Canalith Repostioning;Cryotherapy;Biofeedback;Electrical Stimulation;Moist Heat;Traction;Ultrasound;DME Instruction;Gait training;Stair training;Functional mobility training;Therapeutic activities;Therapeutic exercise;Balance training;Neuromuscular re-education;Patient/family education;Orthotic Fit/Training;Wheelchair mobility training;Manual techniques;Passive range of motion;Energy conservation;Splinting;Taping;Vestibular;Visual/perceptual remediation/compensation;Joint Manipulations;Dry needling;Spinal Manipulations    PT Next Visit Plan further vestibular assessment, continue to add to HEP, roll testing    PT Home Exercise Plan Access Code: [email or web address] URL: [email or web address]    Consulted and Agree with Plan of Care Patient;Family member/caregiver             Patient will benefit from skilled therapeutic intervention in order to improve the following deficits and impairments:  Abnormal gait, Decreased activity tolerance, Decreased endurance, Decreased range of motion, Decreased strength, Dizziness, Hypomobility, Increased fascial restricitons, Improper body mechanics, Decreased balance, Decreased mobility, Difficulty walking, Impaired flexibility, Postural dysfunction, Obesity, Pain, Increased muscle spasms  Visit Diagnosis: Abnormality of gait and mobility  Difficulty in walking, not elsewhere classified  Muscle weakness (generalized)  Other lack of coordination     Problem List Patient Active Problem List   Diagnosis Date Noted   Osteopenia of femoral neck, bilateral 10/15/2016   Breast cancer of lower-outer quadrant of right female breast ([facility] 04/02/2014   [place]D ([place] disease), stage III ([facility]    Essential hypertension, benign 08/14/2012   Hypercholesteremia 08/14/2012   Diabetes ([facility] 08/14/2012   Pyelonephritis 05/29/2011   Constipation  05/29/2011   [place] kidney injury) ([facility] [place] 05/29/2011    JLewis Moccasin PT 12/22/2020,  1:00 PM  [place] MAIN [facility] SERVICES [address] [place], [place], [address] Phone: [phone]   Fax:  [phone]  Name: VANDORA JASKULSKI MRN: [id number] Date of Birth: 05/04/1946

## 2020-12-18 NOTE — Telephone Encounter (Signed)
-----   Message from Star Age, MD sent at 12/18/2020  7:45 AM EDT ----- Please call patient and advise her that there were a couple of abnormalities on her blood test.  A autoimmune marker called ANA was positive, this can be seen in a variety of conditions, often rheumatological or autoimmune conditions such as rheumatoid arthritis, lupus or what we call Sjogren's syndrome.  I would like to get her evaluated further with her rheumatologist.  In addition, her vitamin D was low at 19.8, lower end of cutoff is usually 30.  She is advised to start an over-the-counter vitamin D supplement, 1000 to 2000 units daily are recommended and I would recommend to follow-up with her primary care for recheck.  I also would like for her to follow-up with her primary care regarding her vitamin B12 being low.  I would like for her to start an over-the-counter B12 supplement also, around 1000 mcg daily for now.  We usually recommend B12 injections initially, again, I would like for her to make a follow-up appointment regarding this with her primary care physician.  If she is agreeable, I would like to make a referral to rheumatology, please place a referral after talking to her.  Indication: Positive ANA, cognitive complaints, positive anti-Ro and anti-La antibodies.

## 2020-12-18 NOTE — Telephone Encounter (Signed)
I called pt. No answer, left a message asking pt to call me back.   

## 2020-12-22 ENCOUNTER — Telehealth: Payer: Self-pay

## 2020-12-22 LAB — COMPREHENSIVE METABOLIC PANEL
ALT: 11 IU/L (ref 0–32)
AST: 17 IU/L (ref 0–40)
Albumin/Globulin Ratio: 1.6 (ref 1.2–2.2)
Albumin: 4.5 g/dL (ref 3.7–4.7)
Alkaline Phosphatase: 93 IU/L (ref 44–121)
BUN/Creatinine Ratio: 15 (ref 12–28)
BUN: 17 mg/dL (ref 8–27)
Bilirubin Total: 0.3 mg/dL (ref 0.0–1.2)
CO2: 21 mmol/L (ref 20–29)
Calcium: 9.2 mg/dL (ref 8.7–10.3)
Chloride: 102 mmol/L (ref 96–106)
Creatinine, Ser: 1.13 mg/dL — ABNORMAL HIGH (ref 0.57–1.00)
Globulin, Total: 2.9 g/dL (ref 1.5–4.5)
Glucose: 81 mg/dL (ref 65–99)
Potassium: 4.8 mmol/L (ref 3.5–5.2)
Sodium: 143 mmol/L (ref 134–144)
Total Protein: 7.4 g/dL (ref 6.0–8.5)
eGFR: 50 mL/min/{1.73_m2} — ABNORMAL LOW (ref >59)

## 2020-12-22 LAB — VITAMIN D 25 HYDROXY (VIT D DEFICIENCY, FRACTURES): Vit D, 25-Hydroxy: 19.8 ng/mL — ABNORMAL LOW (ref 30.0–100.0)

## 2020-12-22 LAB — CBC WITH DIFFERENTIAL/PLATELET
Basophils Absolute: 0.1 10*3/uL (ref 0.0–0.2)
Basos: 1 %
EOS (ABSOLUTE): 0.3 10*3/uL (ref 0.0–0.4)
Eos: 4 %
Hematocrit: 36.5 % (ref 34.0–46.6)
Hemoglobin: 11.8 g/dL (ref 11.1–15.9)
Immature Grans (Abs): 0 10*3/uL (ref 0.0–0.1)
Immature Granulocytes: 0 %
Lymphocytes Absolute: 2.2 10*3/uL (ref 0.7–3.1)
Lymphs: 33 %
MCH: 29.4 pg (ref 26.6–33.0)
MCHC: 32.3 g/dL (ref 31.5–35.7)
MCV: 91 fL (ref 79–97)
Monocytes Absolute: 0.4 10*3/uL (ref 0.1–0.9)
Monocytes: 6 %
Neutrophils Absolute: 3.7 10*3/uL (ref 1.4–7.0)
Neutrophils: 56 %
Platelets: 335 10*3/uL (ref 150–450)
RBC: 4.02 x10E6/uL (ref 3.77–5.28)
RDW: 13.7 % (ref 11.7–15.4)
WBC: 6.7 10*3/uL (ref 3.4–10.8)

## 2020-12-22 LAB — ENA+DNA/DS+SJORGEN'S
ENA RNP Ab: <0.2 AI (ref 0.0–0.9)
ENA SM Ab Ser-aCnc: <0.2 AI (ref 0.0–0.9)
ENA SSA (RO) Ab: 4.7 AI — ABNORMAL HIGH (ref 0.0–0.9)
ENA SSB (LA) Ab: 2.4 AI — ABNORMAL HIGH (ref 0.0–0.9)
dsDNA Ab: <1 IU/mL (ref 0–9)

## 2020-12-22 LAB — B12 AND FOLATE PANEL
Folate: 10.7 ng/mL (ref >3.0)
Vitamin B-12: 144 pg/mL — ABNORMAL LOW (ref 232–1245)

## 2020-12-22 LAB — RHEUMATOID FACTOR: Rheumatoid fact SerPl-aCnc: <10 IU/mL (ref <14.0)

## 2020-12-22 LAB — VITAMIN B6: Vitamin B6: 4.8 ug/L (ref 3.4–65.2)

## 2020-12-22 LAB — TSH: TSH: 2.52 u[IU]/mL (ref 0.450–4.500)

## 2020-12-22 LAB — HGB A1C W/O EAG: Hgb A1c MFr Bld: 5.5 % (ref 4.8–5.6)

## 2020-12-22 LAB — RPR: RPR Ser Ql: NONREACTIVE

## 2020-12-22 LAB — ANA W/REFLEX: Anti Nuclear Antibody (ANA): POSITIVE — AB

## 2020-12-22 LAB — VITAMIN B1: Thiamine: 126.5 nmol/L (ref 66.5–200.0)

## 2020-12-22 LAB — SEDIMENTATION RATE: Sed Rate: 9 mm/hr (ref 0–40)

## 2020-12-22 NOTE — Telephone Encounter (Signed)
LVM for pt to call me back to schedule sleep study  

## 2020-12-23 ENCOUNTER — Other Ambulatory Visit: Payer: Self-pay

## 2020-12-23 ENCOUNTER — Ambulatory Visit: Payer: Medicare Other

## 2020-12-23 DIAGNOSIS — M6281 Muscle weakness (generalized): Secondary | ICD-10-CM

## 2020-12-23 DIAGNOSIS — R2689 Other abnormalities of gait and mobility: Secondary | ICD-10-CM

## 2020-12-23 DIAGNOSIS — R2681 Unsteadiness on feet: Secondary | ICD-10-CM

## 2020-12-23 NOTE — Telephone Encounter (Signed)
Pt returned phone call. Would like a call back. 

## 2020-12-23 NOTE — Telephone Encounter (Signed)
Pt's daughter ( cathy, ok per ) called me back and we reviewed results of lab testing. She verbalized understanding and sts ok to place referral for rheumatology along with supplementation for Vit D and B12.  Daughter sts pt will f/u within 3-4 months with PCP to recheck levels.  Pt will proceed with sleep study as recommended.

## 2020-12-23 NOTE — Telephone Encounter (Signed)
Sent to [facility] Rheumatology. P: [id number]

## 2020-12-23 NOTE — Telephone Encounter (Signed)
I called pt. No answer, left a message asking pt to call me back. This was my second attempt.

## 2020-12-23 NOTE — Therapy (Signed)
[facility] [address] [place], [place], [address] Phone: [phone]   Fax:  [phone]  Physical Therapy Treatment  Patient Details  Name: Kathryn Wall MRN: [id number] Date of Birth: 08/22/1945 Referring Provider (PT): [name], MD   Encounter Date: 12/23/2020   PT End of Session - 12/23/20 1525     Visit Number 5    Number of Visits 25    Date for PT Re-Evaluation 03/03/21    Authorization Type eval performed on 12/09/2020    PT Start Time 1146    PT Stop Time 1229    PT Time Calculation (min) 43 min    Equipment Utilized During Treatment Gait belt    Activity Tolerance Patient tolerated treatment well    Behavior During Therapy WFL for tasks assessed/performed             Past Medical History:  Diagnosis Date   Breast cancer ([place]) 1997   AGE 78, BRCA 1 NEGATIVE 2. 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    CKD (chronic kidney disease), stage III ([place])    Diabetes mellitus    TYPE II   High cholesterol    Hypertension     Past Surgical History:  Procedure Laterality Date   [place]    There were no vitals filed for this visit.   Subjective Assessment - 12/23/20 1152     Subjective Pt reports no falls or near falls since last appointment. She does report she had some light-headedness but no more spinning and reports significant improvement in her dizziness.    Patient is accompained by: Family member    Pertinent History Per chart pt is a pleasant 75 y.o. female with a PMH that includes diabetes, CKD, hypertension, high cholesterol, osteopenia, cholecystectomy, hysterectomy, breast cancer. Per pt she reports history of recurrent low back pain. Pt presents to PT exam due to reports of weakness, and decreasing ability to ambulate with reports of 3 falls in the past 6 months. Pt  also reports she experiences dizziness where the room appears to spin.    Limitations Walking;Standing;House hold activities;Lifting    How long can you sit comfortably? unaffected    How long can you stand comfortably? 5 minutes    How long can you walk comfortably? Pt reports about 5 minutes with RW after which she needs to sit down.    Diagnostic tests DEXA on 10/23/2020    Patient Stated Goals Pt would like to walk without a cane and to be able to get up out of the chair    Currently in Pain? No/denies            TREATMENT  Therex:  Instructed patient in standing LE strengthening at support bar, 3 sets of 15 reps for each with 3# AW on BLEs (unless otherwise noted)  Standing Hip march- first 15 reps without AW, following two rounds with AWs on BLEs Standing hip ext *only two sets performed. Pt has difficulty with technique. Standing hip abd; pt rates exercise as medium Seated heel raises pt rates medium  STS 3x5 with UE assist. PT instructed pt in technique, LE position, position in chair and increasing forward lean (cuing "nose over toes). Pt reports greater ease with STS with correct technique.  Neuro Re-ed:  Semi-tandem stance 2x30 sec each LE  Tandem-stance 2x30 sec; greater difficulty with RLE as stance leg  Modified SLB with one foot on floor and other on 6" step - 2x30 sec each LE; intermittent UE support Standing WBOS with horizontal, vertical head turns x multiple reps each   Education provided throughout session via VC/TC and demonstration to facilitate movement at target joints and correct muscle activation for all testing and exercises performed.      PT Education - 12/23/20 1525     Education Details exercise technique, body mechanics    Person(s) Educated Patient    Methods Explanation;Demonstration;Tactile cues;Verbal cues    Comprehension Verbalized understanding;Returned demonstration;Need further instruction              PT Short Term Goals -  12/09/20 1138       PT SHORT TERM GOAL #1   Title Pt will be independent with HEP in order to improve strength and balance in order to decrease fall risk and improve function at home and work.    Baseline 6/14: issued inital HEP of STS with counter support, UE assist    Time 6    Period Weeks    Status New    Target Date 01/20/21               PT Long Term Goals - 12/09/20 1138       PT LONG TERM GOAL #1   Title Pt will decrease DHI score by at least 18 points in order to demonstrate clinically significant reduction in disability    Baseline 6/14: 36/100    Time 12    Period Weeks    Status New    Target Date 03/03/21      PT LONG TERM GOAL #2   Title Pt will improve ABC by at least 13% in order to demonstrate clinically significant improvement in balance confidence.    Baseline 6/14: 23.75%    Time 12    Period Weeks    Status New    Target Date 03/03/21      PT LONG TERM GOAL #3   Title Pt will decrease 5TSTS by at least 3 seconds in order to demonstrate clinically significant improvement in LE strength.    Baseline 6/14: 22.86 sec with use of BUEs    Time 12    Period Weeks    Status New    Target Date 03/03/21      PT LONG TERM GOAL #4   Title Patient will increase FOTO score to equal to or greater than  50   to demonstrate statistically significant improvement in mobility and quality of life.    Baseline 6/14: 42    Time 12    Period Weeks    Status New    Target Date 03/03/21      PT LONG TERM GOAL #5   Title Pt will improve BERG by at least 3 points in order to demonstrate clinically significant improvement in balance.    Baseline 6/14: 26/5    Time 12    Period Weeks    Status New    Target Date 03/03/21      Additional Long Term Goals   Additional Long Term Goals Yes      PT LONG TERM GOAL #6   Title Patient will increase 10 meter walk test to >1.27ms as to improve gait speed for better community ambulation and to reduce fall risk.     Baseline 6/14: 0.638 m/s with RW    Time  12    Period Weeks    Status New    Target Date 03/03/21                   Plan - 12/23/20 1531     Clinical Impression Statement Pt highly motivated to participate in PT. She continues to report no dizziness with exercises, but does feel her legs are "wobbly" with majority of therex and balance activities. The pt finds it more challenging to maintain balance with RLE as primary stance leg compared to LLE. The pt will benefit from further skilled PT to improve strength and balance in order to decrease fall risk.    Personal Factors and Comorbidities Age;Comorbidity 1;Comorbidity 2;Comorbidity 3+;Fitness;Sex;Social Background;Time since onset of injury/illness/exacerbation    Comorbidities DMII with hyperlipidemia, HTN, stage 3 chronic kidney disease, anxiety, osteopenia of femoral neck B, reports of recurrent low back pain, hx of breast cancer and hysterectomy    Examination-Activity Limitations Bathing;Carry;Lift;Stand;Bed Mobility;Continence;Locomotion Level;Bend;Dressing;Reach Overhead;Squat;Transfers;Hygiene/Grooming;Stairs;Caring for Others    Examination-Participation Restrictions Cleaning;Community Activity;Laundry;Shop;Yard Work;Medication Management;Meal Prep    Stability/Clinical Decision Making Evolving/Moderate complexity    Rehab Potential Good    PT Frequency 2x / week    PT Duration 12 weeks    PT Treatment/Interventions ADLs/Self Care Home Management;Canalith Repostioning;Cryotherapy;Biofeedback;Electrical Stimulation;Moist Heat;Traction;Ultrasound;DME Instruction;Gait training;Stair training;Functional mobility training;Therapeutic activities;Therapeutic exercise;Balance training;Neuromuscular re-education;Patient/family education;Orthotic Fit/Training;Wheelchair mobility training;Manual techniques;Passive range of motion;Energy conservation;Splinting;Taping;Vestibular;Visual/perceptual remediation/compensation;Joint  Manipulations;Dry needling;Spinal Manipulations    PT Next Visit Plan further vestibular assessment, continue to add to HEP, dynamic balance, gait    PT Home Exercise Plan Access Code: [email or web address]  URL: [email or web address] no updates at this time    Consulted and Agree with Plan of Care Patient;Family member/caregiver             Patient will benefit from skilled therapeutic intervention in order to improve the following deficits and impairments:  Abnormal gait, Decreased activity tolerance, Decreased endurance, Decreased range of motion, Decreased strength, Dizziness, Hypomobility, Increased fascial restricitons, Improper body mechanics, Decreased balance, Decreased mobility, Difficulty walking, Impaired flexibility, Postural dysfunction, Obesity, Pain, Increased muscle spasms  Visit Diagnosis: Unsteadiness on feet  Muscle weakness (generalized)  Other abnormalities of gait and mobility     Problem List Patient Active Problem List   Diagnosis Date Noted   Osteopenia of femoral neck, bilateral 10/15/2016   Breast cancer of lower-outer quadrant of right female breast ([facility] [place]) 04/02/2014   CKD (chronic kidney disease), stage III ([facility])    Essential hypertension, benign 08/14/2012   Hypercholesteremia 08/14/2012   Diabetes ([place]) 08/14/2012   Pyelonephritis 05/29/2011   Constipation 05/29/2011   AKI (acute kidney injury) ([place]) 05/29/2011   Ricard Dillon PT, DPT 12/23/2020, 3:34 PM  [facility] [address] [place], [place], [address] Phone: [phone]   Fax:  [phone]  Name: Kathryn Wall MRN: [id number] Date of Birth: Sep 16, 1945

## 2020-12-23 NOTE — Addendum Note (Signed)
Addended by: Verlin Grills on: 12/23/2020 01:51 PM   Modules accepted: Orders

## 2020-12-24 ENCOUNTER — Telehealth: Payer: Self-pay

## 2020-12-24 NOTE — Telephone Encounter (Signed)
LVM for pt to call me back to schedule sleep study  

## 2020-12-25 ENCOUNTER — Ambulatory Visit: Payer: Medicare Other

## 2020-12-30 ENCOUNTER — Other Ambulatory Visit: Payer: Self-pay | Admitting: Family Medicine

## 2020-12-30 ENCOUNTER — Encounter (HOSPITAL_COMMUNITY): Payer: Self-pay | Admitting: *Deleted

## 2020-12-30 ENCOUNTER — Inpatient Hospital Stay (HOSPITAL_COMMUNITY)
Admission: EM | Admit: 2020-12-30 | Discharge: 2021-01-05 | DRG: 177 | Disposition: A | Discharge status: Skilled Nursing Facility | Payer: Medicare Other | Attending: Family Medicine | Admitting: Family Medicine

## 2020-12-30 ENCOUNTER — Ambulatory Visit: Payer: Medicare Other

## 2020-12-30 ENCOUNTER — Other Ambulatory Visit: Payer: Self-pay

## 2020-12-30 ENCOUNTER — Emergency Department (HOSPITAL_COMMUNITY): Payer: Medicare Other

## 2020-12-30 DIAGNOSIS — R0902 Hypoxemia: Secondary | ICD-10-CM | POA: Diagnosis present

## 2020-12-30 DIAGNOSIS — R4189 Other symptoms and signs involving cognitive functions and awareness: Secondary | ICD-10-CM | POA: Diagnosis present

## 2020-12-30 DIAGNOSIS — Z9049 Acquired absence of other specified parts of digestive tract: Secondary | ICD-10-CM

## 2020-12-30 DIAGNOSIS — I129 Hypertensive chronic kidney disease with stage 1 through stage 4 chronic kidney disease, or unspecified chronic kidney disease: Secondary | ICD-10-CM | POA: Diagnosis present

## 2020-12-30 DIAGNOSIS — Z9181 History of falling: Secondary | ICD-10-CM

## 2020-12-30 DIAGNOSIS — J690 Pneumonitis due to inhalation of food and vomit: Principal | ICD-10-CM | POA: Diagnosis present

## 2020-12-30 DIAGNOSIS — E1169 Type 2 diabetes mellitus with other specified complication: Secondary | ICD-10-CM | POA: Diagnosis present

## 2020-12-30 DIAGNOSIS — Z791 Long term (current) use of non-steroidal anti-inflammatories (NSAID): Secondary | ICD-10-CM

## 2020-12-30 DIAGNOSIS — Z90710 Acquired absence of both cervix and uterus: Secondary | ICD-10-CM

## 2020-12-30 DIAGNOSIS — E871 Hypo-osmolality and hyponatremia: Secondary | ICD-10-CM | POA: Diagnosis present

## 2020-12-30 DIAGNOSIS — R531 Weakness: Secondary | ICD-10-CM | POA: Diagnosis not present

## 2020-12-30 DIAGNOSIS — Z8249 Family history of ischemic heart disease and other diseases of the circulatory system: Secondary | ICD-10-CM

## 2020-12-30 DIAGNOSIS — Z7984 Long term (current) use of oral hypoglycemic drugs: Secondary | ICD-10-CM

## 2020-12-30 DIAGNOSIS — R296 Repeated falls: Secondary | ICD-10-CM | POA: Diagnosis present

## 2020-12-30 DIAGNOSIS — K5792 Diverticulitis of intestine, part unspecified, without perforation or abscess without bleeding: Secondary | ICD-10-CM

## 2020-12-30 DIAGNOSIS — J189 Pneumonia, unspecified organism: Secondary | ICD-10-CM

## 2020-12-30 DIAGNOSIS — Z885 Allergy status to narcotic agent status: Secondary | ICD-10-CM

## 2020-12-30 DIAGNOSIS — Z923 Personal history of irradiation: Secondary | ICD-10-CM

## 2020-12-30 DIAGNOSIS — F419 Anxiety disorder, unspecified: Secondary | ICD-10-CM | POA: Diagnosis present

## 2020-12-30 DIAGNOSIS — I480 Paroxysmal atrial fibrillation: Secondary | ICD-10-CM | POA: Diagnosis not present

## 2020-12-30 DIAGNOSIS — Z800 Family history of malignant neoplasm of digestive organs: Secondary | ICD-10-CM

## 2020-12-30 DIAGNOSIS — R5381 Other malaise: Secondary | ICD-10-CM | POA: Diagnosis present

## 2020-12-30 DIAGNOSIS — R6510 Systemic inflammatory response syndrome (SIRS) of non-infectious origin without acute organ dysfunction: Secondary | ICD-10-CM | POA: Diagnosis present

## 2020-12-30 DIAGNOSIS — R8281 Pyuria: Secondary | ICD-10-CM | POA: Diagnosis present

## 2020-12-30 DIAGNOSIS — E782 Mixed hyperlipidemia: Secondary | ICD-10-CM | POA: Diagnosis present

## 2020-12-30 DIAGNOSIS — E1122 Type 2 diabetes mellitus with diabetic chronic kidney disease: Secondary | ICD-10-CM | POA: Diagnosis present

## 2020-12-30 DIAGNOSIS — K219 Gastro-esophageal reflux disease without esophagitis: Secondary | ICD-10-CM | POA: Diagnosis present

## 2020-12-30 DIAGNOSIS — Z888 Allergy status to other drugs, medicaments and biological substances status: Secondary | ICD-10-CM

## 2020-12-30 DIAGNOSIS — I10 Essential (primary) hypertension: Secondary | ICD-10-CM | POA: Diagnosis present

## 2020-12-30 DIAGNOSIS — N1831 Chronic kidney disease, stage 3a: Secondary | ICD-10-CM | POA: Diagnosis present

## 2020-12-30 DIAGNOSIS — W19XXXA Unspecified fall, initial encounter: Secondary | ICD-10-CM

## 2020-12-30 DIAGNOSIS — Z20822 Contact with and (suspected) exposure to covid-19: Secondary | ICD-10-CM | POA: Diagnosis present

## 2020-12-30 DIAGNOSIS — K5732 Diverticulitis of large intestine without perforation or abscess without bleeding: Secondary | ICD-10-CM | POA: Diagnosis present

## 2020-12-30 DIAGNOSIS — R509 Fever, unspecified: Secondary | ICD-10-CM

## 2020-12-30 DIAGNOSIS — Z853 Personal history of malignant neoplasm of breast: Secondary | ICD-10-CM

## 2020-12-30 DIAGNOSIS — D631 Anemia in chronic kidney disease: Secondary | ICD-10-CM | POA: Diagnosis present

## 2020-12-30 DIAGNOSIS — Z79899 Other long term (current) drug therapy: Secondary | ICD-10-CM

## 2020-12-30 DIAGNOSIS — R350 Frequency of micturition: Secondary | ICD-10-CM | POA: Diagnosis present

## 2020-12-30 DIAGNOSIS — G9341 Metabolic encephalopathy: Secondary | ICD-10-CM | POA: Diagnosis not present

## 2020-12-30 DIAGNOSIS — E876 Hypokalemia: Secondary | ICD-10-CM | POA: Diagnosis not present

## 2020-12-30 LAB — RESP PANEL BY RT-PCR (FLU A&B, COVID) ARPGX2
Influenza A by PCR: NEGATIVE
Influenza B by PCR: NEGATIVE
SARS Coronavirus 2 by RT PCR: NEGATIVE

## 2020-12-30 LAB — COMPREHENSIVE METABOLIC PANEL
ALT: 20 U/L (ref 0–44)
AST: 45 U/L — ABNORMAL HIGH (ref 15–41)
Albumin: 3.8 g/dL (ref 3.5–5.0)
Alkaline Phosphatase: 65 U/L (ref 38–126)
Anion gap: 13 (ref 5–15)
BUN: 14 mg/dL (ref 8–23)
CO2: 22 mmol/L (ref 22–32)
Calcium: 9 mg/dL (ref 8.9–10.3)
Chloride: 97 mmol/L — ABNORMAL LOW (ref 98–111)
Creatinine, Ser: 1.13 mg/dL — ABNORMAL HIGH (ref 0.44–1.00)
GFR, Estimated: 50 mL/min — ABNORMAL LOW (ref >60)
Glucose, Bld: 134 mg/dL — ABNORMAL HIGH (ref 70–99)
Potassium: 3.5 mmol/L (ref 3.5–5.1)
Sodium: 132 mmol/L — ABNORMAL LOW (ref 135–145)
Total Bilirubin: 0.9 mg/dL (ref 0.3–1.2)
Total Protein: 7.6 g/dL (ref 6.5–8.1)

## 2020-12-30 LAB — CBC WITH DIFFERENTIAL/PLATELET
Abs Immature Granulocytes: 0.18 K/uL — ABNORMAL HIGH (ref 0.00–0.07)
Basophils Absolute: 0.1 K/uL (ref 0.0–0.1)
Basophils Relative: 0 %
Eosinophils Absolute: 0.1 K/uL (ref 0.0–0.5)
Eosinophils Relative: 1 %
HCT: 37.9 % (ref 36.0–46.0)
Hemoglobin: 12.1 g/dL (ref 12.0–15.0)
Immature Granulocytes: 1 %
Lymphocytes Relative: 9 %
Lymphs Abs: 1.7 K/uL (ref 0.7–4.0)
MCH: 29 pg (ref 26.0–34.0)
MCHC: 31.9 g/dL (ref 30.0–36.0)
MCV: 90.9 fL (ref 80.0–100.0)
Monocytes Absolute: 1.3 K/uL — ABNORMAL HIGH (ref 0.1–1.0)
Monocytes Relative: 7 %
Neutro Abs: 14.9 K/uL — ABNORMAL HIGH (ref 1.7–7.7)
Neutrophils Relative %: 82 %
Platelets: 245 K/uL (ref 150–400)
RBC: 4.17 MIL/uL (ref 3.87–5.11)
RDW: 15.1 % (ref 11.5–15.5)
WBC: 18.3 K/uL — ABNORMAL HIGH (ref 4.0–10.5)
nRBC: 0 % (ref 0.0–0.2)

## 2020-12-30 LAB — SARS CORONAVIRUS 2 (TAT 6-24 HRS): SARS Coronavirus 2: NEGATIVE

## 2020-12-30 LAB — URINALYSIS, ROUTINE W REFLEX MICROSCOPIC
Bacteria, UA: NONE SEEN
Bilirubin Urine: NEGATIVE
Glucose, UA: >=500 mg/dL — AB
Ketones, ur: 20 mg/dL — AB
Nitrite: NEGATIVE
Protein, ur: 100 mg/dL — AB
Specific Gravity, Urine: 1.026 (ref 1.005–1.030)
pH: 5 (ref 5.0–8.0)

## 2020-12-30 LAB — LIPASE, BLOOD: Lipase: 36 U/L (ref 11–51)

## 2020-12-30 LAB — LACTIC ACID, PLASMA: Lactic Acid, Venous: 1.7 mmol/L (ref 0.5–1.9)

## 2020-12-30 MED ORDER — AMOXICILLIN-POT CLAVULANATE 875-125 MG PO TABS
1.0000 | ORAL_TABLET | Freq: Once | ORAL | Status: AC
  Administered 2020-12-30: 1 via ORAL
  Filled 2020-12-30: qty 1

## 2020-12-30 MED ORDER — SODIUM CHLORIDE 0.9 % IV BOLUS
1000.0000 mL | Freq: Once | INTRAVENOUS | Status: AC
  Administered 2020-12-31: 1000 mL via INTRAVENOUS

## 2020-12-30 MED ORDER — IBUPROFEN 800 MG PO TABS
800.0000 mg | ORAL_TABLET | Freq: Once | ORAL | Status: AC
  Administered 2020-12-30: 800 mg via ORAL
  Filled 2020-12-30: qty 1

## 2020-12-30 MED ORDER — ONDANSETRON HCL 4 MG/2ML IJ SOLN
4.0000 mg | Freq: Once | INTRAMUSCULAR | Status: AC
  Administered 2020-12-30: 4 mg via INTRAVENOUS
  Filled 2020-12-30: qty 2

## 2020-12-30 MED ORDER — ACETAMINOPHEN 325 MG PO TABS
325.0000 mg | ORAL_TABLET | Freq: Once | ORAL | Status: DC
  Filled 2020-12-30: qty 1

## 2020-12-30 MED ORDER — SODIUM CHLORIDE 0.9 % IV BOLUS
1000.0000 mL | Freq: Once | INTRAVENOUS | Status: AC
  Administered 2020-12-30: 1000 mL via INTRAVENOUS

## 2020-12-30 MED ORDER — IOHEXOL 300 MG/ML  SOLN
100.0000 mL | Freq: Once | INTRAMUSCULAR | Status: AC | PRN
  Administered 2020-12-30: 80 mL via INTRAVENOUS

## 2020-12-30 MED ORDER — AMOXICILLIN-POT CLAVULANATE 875-125 MG PO TABS: 1.0000 | ORAL_TABLET | Freq: Two times a day (BID) | ORAL | 0 refills | Status: DC

## 2020-12-30 MED ORDER — ACETAMINOPHEN 325 MG PO TABS
650.0000 mg | ORAL_TABLET | Freq: Once | ORAL | Status: AC
  Administered 2020-12-30: 650 mg via ORAL

## 2020-12-30 NOTE — ED Provider Notes (Addendum)
[facility] EMERGENCY DEPARTMENT Provider Note   CSN: [id number] Arrival date & time: 12/30/20  1159     History Chief Complaint  Patient presents with   Kathryn Wall is a 75 y.o. female.  HPI  75 year old female with past medical history of HTN, HLD, DM, CKD presents the emergency department with concern for weakness.  Patient states this has been worsening over the past 2 days.  Today she was so weak she had to mechanical falls, denies any injury sustained from these falls.  Over the past 2 days she has been experiencing diarrhea, urinary frequency, nonproductive cough and chills at home.  No recent sick contacts.  She denies any ongoing abdominal pain or swelling of her lower extremities. She has had mild nausea without vomiting.  Past Medical History:  Diagnosis Date   Breast cancer ([place]) 1997   AGE 63, BRCA 1 NEGATIVE 2. 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    CKD (chronic kidney disease), stage III ([facility])    Diabetes mellitus    TYPE II   High cholesterol    Hypertension     Patient Active Problem List   Diagnosis Date Noted   Osteopenia of femoral neck, bilateral 10/15/2016   Breast cancer of lower-outer quadrant of right female breast ([place]) 04/02/2014   CKD (chronic kidney disease), stage III ([place])    Essential hypertension, benign 08/14/2012   Hypercholesteremia 08/14/2012   Diabetes ([place]) 08/14/2012   Pyelonephritis 05/29/2011   Constipation 05/29/2011   AKI (acute kidney injury) ([place]) 05/29/2011    Past Surgical History:  Procedure Laterality Date   ABDOMINAL HYSTERECTOMY  1985   TAH   BREAST SURGERY  1997   RIGHT BREAST LUMPECTOMY   CHOLECYSTECTOMY  1980     OB History     Gravida  3   Para  2   Term  2   Preterm      AB  1   Living  2      SAB  1   IAB      Ectopic      Multiple      Live Births              Family History  Problem Relation Age of Onset   Cancer Mother         COLON   Hypertension Father    Heart disease Father     Social History   Tobacco Use   Smoking status: Never   Smokeless tobacco: Never  Substance Use Topics   Alcohol use: No   Drug use: No    Home Medications Prior to Admission medications   Medication Sig Start Date End Date Taking? Authorizing Provider  amLODipine (NORVASC) 5 MG tablet TAKE 1 TABLET BY MOUTH ONCE DAILY 08/14/20   [name], MD  Ascorbic Acid (VITA-C PO) Take by mouth.    [provider]  atorvastatin (LIPITOR) 40 MG tablet TAKE 1 TABLET BY MOUTH AT BEDTIME 10/22/20   [name], MD  diazepam (VALIUM) 5 MG tablet TAKE 1 TABLET BY MOUTH EVERY 12 HOURS ASNEEDED FOR ANXIETY (SLEEP). 12/30/20   [name], MD  JARDIANCE 25 MG TABS tablet TAKE 1 TABLET BY MOUTH ONCE A DAY 09/08/20   [name], MD  losartan (COZAAR) 50 MG tablet TAKE 1 TABLET BY MOUTH ONCE A DAY 09/16/20   [name], MD  meloxicam (MOBIC) 15 MG  tablet Take 1 tablet (15 mg total) by mouth daily. 10/23/20   [name], MD  nystatin ointment (MYCOSTATIN) APPLY TO AFFECTED AREAS TWICE DAILY 11/10/20   [name], MD  pantoprazole (PROTONIX) 40 MG tablet TAKE 1 TABLET BY MOUTH EVERY MORNING 06/25/20   [name], MD  PARoxetine (PAXIL) 20 MG tablet Take 1 tablet (20 mg total) by mouth daily. 09/05/20   [name], MD  pioglitazone (ACTOS) 30 MG tablet Take 1 tablet (30 mg total) by mouth daily. 08/20/20   [name], MD  sitaGLIPtin-metformin (JANUMET) 50-1000 MG tablet TAKE 1 TABLET BY MOUTH TWICE A DAY WITH A MEAL 12/30/20   [name], MD  VITAMIN D PO Take by mouth.    [provider]    Allergies    Allergen [antipyrine-benzocaine], Ciprofloxacin, Cymbalta [duloxetine hcl], Doxazosin, Other, and Percocet [oxycodone-acetaminophen]  Review of Systems   Review of Systems  Constitutional:  Positive for chills and fatigue. Negative for fever.  HENT:  Negative for  congestion.   Eyes:  Negative for visual disturbance.  Respiratory:  Negative for shortness of breath.   Cardiovascular:  Negative for chest pain.  Gastrointestinal:  Positive for diarrhea, nausea and vomiting. Negative for abdominal pain.  Genitourinary:  Positive for frequency. Negative for dysuria.  Skin:  Negative for rash.  Neurological:  Negative for headaches.   Physical Exam Updated Vital Signs BP (!) 142/94   Pulse (!) 104   Temp (!) 102.3 F (39.1 C) (Oral)   Resp (!) 30   SpO2 92%   Physical Exam Vitals and nursing note reviewed.  Constitutional:      Appearance: Normal appearance.     Comments: Fatigued appearing  HENT:     Head: Normocephalic.     Mouth/Throat:     Mouth: Mucous membranes are moist.  Eyes:     Pupils: Pupils are equal, round, and reactive to light.  Cardiovascular:     Rate and Rhythm: Normal rate.  Pulmonary:     Effort: Pulmonary effort is normal. No respiratory distress.  Abdominal:     Palpations: Abdomen is soft.     Tenderness: There is no abdominal tenderness. There is no guarding or rebound.  Musculoskeletal:        General: No swelling.     Cervical back: No rigidity or tenderness.  Skin:    General: Skin is warm.  Neurological:     Mental Status: She is alert and oriented to person, place, and time. Mental status is at baseline.  Psychiatric:        Mood and Affect: Mood normal.    ED Results / Procedures / Treatments   Labs (all labs ordered are listed, but only abnormal results are displayed) Labs Reviewed  CBC WITH DIFFERENTIAL/PLATELET - Abnormal; Notable for the following components:      Result Value   WBC 18.3 (*)    Neutro Abs 14.9 (*)    Monocytes Absolute 1.3 (*)    Abs Immature Granulocytes 0.18 (*)    All other components within normal limits  SARS CORONAVIRUS 2 (TAT 6-24 HRS)  URINE CULTURE  LACTIC ACID, PLASMA  COMPREHENSIVE METABOLIC PANEL  LIPASE, BLOOD  URINALYSIS, ROUTINE W REFLEX MICROSCOPIC     EKG None  Radiology DG Chest 1 View  Result Date: 12/30/2020 CLINICAL DATA:  Generalized weakness.  Fall 2 hours ago EXAM: CHEST  1 VIEW COMPARISON:  02/21/2020 FINDINGS: The heart size and mediastinal contours  are within normal limits. Both lungs are clear. The visualized skeletal structures are unremarkable. IMPRESSION: No active disease. Electronically Signed   By: Miachel Roux M.D.   On: 12/30/2020 13:26    Procedures Procedures   Medications Ordered in ED Medications  sodium chloride 0.9 % bolus 1,000 mL (has no administration in time range)  ondansetron (ZOFRAN) injection 4 mg (has no administration in time range)  acetaminophen (TYLENOL) tablet 650 mg (650 mg Oral Given 12/30/20 1915)    ED Course  I have reviewed the triage vital signs and the nursing notes.  Pertinent labs & imaging results that were available during my care of the patient were reviewed by me and considered in my medical decision making (see chart for details).    MDM Rules/Calculators/A&P                          75 year old female presents the emergency department with weakness.  She states for the last 2 days she has been having nausea/diarrhea and weakness.  Twice today she has felt so weak that she has had to "lowered herself to the ground".  She denies any syncope, no significant head injury.  No ongoing headache.  She is noted to be tachycardic on arrival.  While here she developed a fever. After Tylenol and fluids her fever and tachycardia has resolved.  Flu and COVID is negative.  Lab work shows a leukocytosis, normal lactic.  Chest x-ray is clear.  CT of the abdomen pelvis shows a small area of inflammation, possible mild diverticulitis.  There also is a finding of possible pneumonia in the right lower lung but patient denies any symptoms of productive cough, chest pain.  Urinalysis has leukocytes but no findings of UTI, will send for urine culture.  On re evaluation patient feels improved but  still very weak. Unable to ambulate alone, shaky and weak. Daughter is now at bedside, states she has been very weak at home and unable to ambulate without assistance. Patients fever has returned, patient will be admitted significant weakness 2/2 infection, mild diverticulitis.  Patients evaluation and results requires admission for further treatment and care. Patient agrees with admission plan, offers no new complaints and is stable/unchanged at time of admit.  Final Clinical Impression(s) / ED Diagnoses Final diagnoses:  Fall    Rx / DC Orders ED Discharge Orders     None        Lorelle Gibbs, DO 12/30/20 2355    Lorelle Gibbs, DO 12/31/20 1611    Glorene Leitzke, Alvin Critchley, DO 12/31/20 [phone]

## 2020-12-30 NOTE — ED Triage Notes (Signed)
Pt arrived by ptar from home. Pt reported generalized weakness and had a fall 2 hours ago, no injuries from the fall. Pt reported having covid symptoms for several days with fever last night. Spo2 was 90% on room air for ems.

## 2020-12-30 NOTE — ED Provider Notes (Signed)
Emergency Medicine Provider Triage Evaluation Note  Kathryn Wall , a 75 y.o. female  was evaluated in triage.  Pt complains of 2 no mechanical falls this morning.  Overall weakness, symptoms include cough, urinary frequency for the past 2 days.  Also endorses mild abdominal pain, last bowel movement this morning.  Febrile yesterday with a temp of 100.   Review of Systems  Positive: Fever, abdominal pain, urinary frequency Negative: Nausea, vomiting  Physical Exam  BP (!) 151/96 (BP Location: Left Arm)   Pulse (!) 101   Temp 100.1 F (37.8 C) (Oral)   Resp 18   SpO2 90%  Gen:   Awake, no distress (appears disheveled) Resp:  Normal effort  MSK:   Moves extremities without difficulty  Other:  Lungs are clear to auscultation, abdomen is soft, nontender to palpation.  Medical Decision Making  Medically screening exam initiated at 12:29 PM.  Appropriate orders placed.  Kathryn Wall was informed that the remainder of the evaluation will be completed by another provider, this initial triage assessment does not replace that evaluation, and the importance of remaining in the ED until their evaluation is complete.     Janeece Fitting, PA-C 12/30/20 1235    Lucrezia Starch, MD 12/31/20 [phone]

## 2020-12-30 NOTE — Telephone Encounter (Signed)
Ok to refill??  Last office visit 09/05/2020.  Last refill 12/11/2020.

## 2020-12-30 NOTE — ED Notes (Signed)
While sleeping pt O2 dropped to 88%. Placed on 2L [place].

## 2020-12-30 NOTE — Discharge Instructions (Addendum)
You have been seen and discharged from the emergency department.  Your CAT scan showed inflammation of the distal colon, mild diverticulitis.  Please take antibiotic as directed.  Take Tylenol/ibuprofen as needed for fever/pain control.  Stay well-hydrated.  Follow-up with your primary provider for reevaluation and further care. Take home medications as prescribed. If you have any worsening symptoms or further concerns for your health please return to an emergency department for further evaluation.  Information on my medicine - ELIQUIS (apixaban)  This medication education was reviewed with me or my healthcare representative as part of my discharge preparation.    Why was Eliquis prescribed for you? Eliquis was prescribed for you to reduce the risk of a blood clot forming that can cause a stroke if you have a medical condition called atrial fibrillation (a type of irregular heartbeat).  What do You need to know about Eliquis ? Take your Eliquis TWICE DAILY - one tablet in the morning and one tablet in the evening with or without food. If you have difficulty swallowing the tablet whole please discuss with your pharmacist how to take the medication safely.  Take Eliquis exactly as prescribed by your doctor and DO NOT stop taking Eliquis without talking to the doctor who prescribed the medication.  Stopping may increase your risk of developing a stroke.  Refill your prescription before you run out.  After discharge, you should have regular check-up appointments with your healthcare provider that is prescribing your Eliquis.  In the future your dose may need to be changed if your kidney function or weight changes by a significant amount or as you get older.  What do you do if you miss a dose? If you miss a dose, take it as soon as you remember on the same day and resume taking twice daily.  Do not take more than one dose of ELIQUIS at the same time to make up a missed dose.  Important Safety  Information A possible side effect of Eliquis is bleeding. You should call your healthcare provider right away if you experience any of the following: Bleeding from an injury or your nose that does not stop. Unusual colored urine (red or dark brown) or unusual colored stools (red or black). Unusual bruising for unknown reasons. A serious fall or if you hit your head (even if there is no bleeding).  Some medicines may interact with Eliquis and might increase your risk of bleeding or clotting while on Eliquis. To help avoid this, consult your healthcare provider or pharmacist prior to using any new prescription or non-prescription medications, including herbals, vitamins, non-steroidal anti-inflammatory drugs (NSAIDs) and supplements.  This website has more information on Eliquis (apixaban): http://www.eliquis.com/eliquis/home

## 2020-12-31 ENCOUNTER — Ambulatory Visit: Payer: Medicare Other

## 2020-12-31 ENCOUNTER — Encounter (HOSPITAL_COMMUNITY): Payer: Self-pay | Admitting: Internal Medicine

## 2020-12-31 ENCOUNTER — Other Ambulatory Visit: Payer: Self-pay

## 2020-12-31 ENCOUNTER — Telehealth: Payer: Medicare Other | Admitting: Licensed Clinical Social Worker

## 2020-12-31 DIAGNOSIS — R6510 Systemic inflammatory response syndrome (SIRS) of non-infectious origin without acute organ dysfunction: Secondary | ICD-10-CM | POA: Diagnosis present

## 2020-12-31 DIAGNOSIS — F419 Anxiety disorder, unspecified: Secondary | ICD-10-CM | POA: Diagnosis present

## 2020-12-31 DIAGNOSIS — R531 Weakness: Secondary | ICD-10-CM | POA: Diagnosis present

## 2020-12-31 DIAGNOSIS — E1169 Type 2 diabetes mellitus with other specified complication: Secondary | ICD-10-CM

## 2020-12-31 DIAGNOSIS — N1831 Chronic kidney disease, stage 3a: Secondary | ICD-10-CM | POA: Diagnosis present

## 2020-12-31 DIAGNOSIS — K5732 Diverticulitis of large intestine without perforation or abscess without bleeding: Secondary | ICD-10-CM | POA: Diagnosis present

## 2020-12-31 DIAGNOSIS — R8281 Pyuria: Secondary | ICD-10-CM | POA: Diagnosis present

## 2020-12-31 DIAGNOSIS — E782 Mixed hyperlipidemia: Secondary | ICD-10-CM | POA: Diagnosis present

## 2020-12-31 DIAGNOSIS — Y92009 Unspecified place in unspecified non-institutional (private) residence as the place of occurrence of the external cause: Secondary | ICD-10-CM

## 2020-12-31 DIAGNOSIS — K219 Gastro-esophageal reflux disease without esophagitis: Secondary | ICD-10-CM | POA: Diagnosis present

## 2020-12-31 DIAGNOSIS — R0902 Hypoxemia: Secondary | ICD-10-CM | POA: Diagnosis present

## 2020-12-31 DIAGNOSIS — K5792 Diverticulitis of intestine, part unspecified, without perforation or abscess without bleeding: Secondary | ICD-10-CM | POA: Diagnosis not present

## 2020-12-31 DIAGNOSIS — R350 Frequency of micturition: Secondary | ICD-10-CM | POA: Diagnosis present

## 2020-12-31 DIAGNOSIS — I480 Paroxysmal atrial fibrillation: Secondary | ICD-10-CM | POA: Diagnosis not present

## 2020-12-31 DIAGNOSIS — W19XXXA Unspecified fall, initial encounter: Secondary | ICD-10-CM

## 2020-12-31 DIAGNOSIS — Z9049 Acquired absence of other specified parts of digestive tract: Secondary | ICD-10-CM | POA: Diagnosis not present

## 2020-12-31 DIAGNOSIS — G9341 Metabolic encephalopathy: Secondary | ICD-10-CM | POA: Diagnosis not present

## 2020-12-31 DIAGNOSIS — J189 Pneumonia, unspecified organism: Secondary | ICD-10-CM

## 2020-12-31 DIAGNOSIS — Z853 Personal history of malignant neoplasm of breast: Secondary | ICD-10-CM | POA: Diagnosis not present

## 2020-12-31 DIAGNOSIS — I129 Hypertensive chronic kidney disease with stage 1 through stage 4 chronic kidney disease, or unspecified chronic kidney disease: Secondary | ICD-10-CM | POA: Diagnosis present

## 2020-12-31 DIAGNOSIS — E876 Hypokalemia: Secondary | ICD-10-CM | POA: Diagnosis not present

## 2020-12-31 DIAGNOSIS — J690 Pneumonitis due to inhalation of food and vomit: Secondary | ICD-10-CM | POA: Diagnosis present

## 2020-12-31 DIAGNOSIS — R4189 Other symptoms and signs involving cognitive functions and awareness: Secondary | ICD-10-CM | POA: Diagnosis present

## 2020-12-31 DIAGNOSIS — E1122 Type 2 diabetes mellitus with diabetic chronic kidney disease: Secondary | ICD-10-CM

## 2020-12-31 DIAGNOSIS — I10 Essential (primary) hypertension: Secondary | ICD-10-CM | POA: Diagnosis not present

## 2020-12-31 DIAGNOSIS — E871 Hypo-osmolality and hyponatremia: Secondary | ICD-10-CM | POA: Diagnosis present

## 2020-12-31 DIAGNOSIS — D631 Anemia in chronic kidney disease: Secondary | ICD-10-CM | POA: Diagnosis present

## 2020-12-31 DIAGNOSIS — I4891 Unspecified atrial fibrillation: Secondary | ICD-10-CM | POA: Diagnosis not present

## 2020-12-31 DIAGNOSIS — R5381 Other malaise: Secondary | ICD-10-CM | POA: Diagnosis present

## 2020-12-31 DIAGNOSIS — Z20822 Contact with and (suspected) exposure to covid-19: Secondary | ICD-10-CM | POA: Diagnosis present

## 2020-12-31 DIAGNOSIS — R296 Repeated falls: Secondary | ICD-10-CM | POA: Diagnosis present

## 2020-12-31 LAB — COMPREHENSIVE METABOLIC PANEL
ALT: 25 U/L (ref 0–44)
AST: 56 U/L — ABNORMAL HIGH (ref 15–41)
Albumin: 2.8 g/dL — ABNORMAL LOW (ref 3.5–5.0)
Alkaline Phosphatase: 63 U/L (ref 38–126)
Anion gap: 10 (ref 5–15)
BUN: 14 mg/dL (ref 8–23)
CO2: 20 mmol/L — ABNORMAL LOW (ref 22–32)
Calcium: 8.2 mg/dL — ABNORMAL LOW (ref 8.9–10.3)
Chloride: 103 mmol/L (ref 98–111)
Creatinine, Ser: 1.03 mg/dL — ABNORMAL HIGH (ref 0.44–1.00)
GFR, Estimated: 56 mL/min — ABNORMAL LOW (ref >60)
Glucose, Bld: 154 mg/dL — ABNORMAL HIGH (ref 70–99)
Potassium: 3.3 mmol/L — ABNORMAL LOW (ref 3.5–5.1)
Sodium: 133 mmol/L — ABNORMAL LOW (ref 135–145)
Total Bilirubin: 0.8 mg/dL (ref 0.3–1.2)
Total Protein: 6 g/dL — ABNORMAL LOW (ref 6.5–8.1)

## 2020-12-31 LAB — CBC WITH DIFFERENTIAL/PLATELET
Abs Immature Granulocytes: 0.12 10*3/uL — ABNORMAL HIGH (ref 0.00–0.07)
Basophils Absolute: 0.1 10*3/uL (ref 0.0–0.1)
Basophils Relative: 1 %
Eosinophils Absolute: 0 10*3/uL (ref 0.0–0.5)
Eosinophils Relative: 0 %
HCT: 38.3 % (ref 36.0–46.0)
Hemoglobin: 11.7 g/dL — ABNORMAL LOW (ref 12.0–15.0)
Immature Granulocytes: 1 %
Lymphocytes Relative: 8 %
Lymphs Abs: 1.2 10*3/uL (ref 0.7–4.0)
MCH: 29.6 pg (ref 26.0–34.0)
MCHC: 30.5 g/dL (ref 30.0–36.0)
MCV: 97 fL (ref 80.0–100.0)
Monocytes Absolute: 1.4 10*3/uL — ABNORMAL HIGH (ref 0.1–1.0)
Monocytes Relative: 9 %
Neutro Abs: 12.3 10*3/uL — ABNORMAL HIGH (ref 1.7–7.7)
Neutrophils Relative %: 81 %
Platelets: 197 10*3/uL (ref 150–400)
RBC: 3.95 MIL/uL (ref 3.87–5.11)
RDW: 15.3 % (ref 11.5–15.5)
WBC: 15.1 10*3/uL — ABNORMAL HIGH (ref 4.0–10.5)
nRBC: 0 % (ref 0.0–0.2)

## 2020-12-31 LAB — CBG MONITORING, ED
Glucose-Capillary: 139 mg/dL — ABNORMAL HIGH (ref 70–99)
Glucose-Capillary: 102 mg/dL — ABNORMAL HIGH (ref 70–99)

## 2020-12-31 LAB — GLUCOSE, CAPILLARY
Glucose-Capillary: 109 mg/dL — ABNORMAL HIGH (ref 70–99)
Glucose-Capillary: 120 mg/dL — ABNORMAL HIGH (ref 70–99)

## 2020-12-31 LAB — PROTIME-INR
INR: 1 (ref 0.8–1.2)
Prothrombin Time: 13.3 seconds (ref 11.4–15.2)

## 2020-12-31 LAB — PROCALCITONIN: Procalcitonin: 0.2 ng/mL

## 2020-12-31 LAB — MAGNESIUM: Magnesium: 1.4 mg/dL — ABNORMAL LOW (ref 1.7–2.4)

## 2020-12-31 LAB — CORTISOL-AM, BLOOD: Cortisol - AM: 31.2 ug/dL — ABNORMAL HIGH (ref 6.7–22.6)

## 2020-12-31 MED ORDER — ACETAMINOPHEN 650 MG RE SUPP: 650.0000 mg | Freq: Four times a day (QID) | RECTAL | Status: DC | PRN

## 2020-12-31 MED ORDER — LOSARTAN POTASSIUM 50 MG PO TABS
50.0000 mg | ORAL_TABLET | Freq: Every day | ORAL | Status: DC
  Administered 2020-12-31 – 2021-01-05 (×6): 50 mg via ORAL
  Filled 2020-12-31 (×6): qty 1

## 2020-12-31 MED ORDER — MELOXICAM 7.5 MG PO TABS
15.0000 mg | ORAL_TABLET | Freq: Every day | ORAL | Status: DC
  Administered 2020-12-31 – 2021-01-02 (×3): 15 mg via ORAL
  Filled 2020-12-31 (×3): qty 2

## 2020-12-31 MED ORDER — PAROXETINE HCL 20 MG PO TABS
20.0000 mg | ORAL_TABLET | Freq: Every day | ORAL | Status: DC
  Administered 2020-12-31 – 2021-01-05 (×6): 20 mg via ORAL
  Filled 2020-12-31 (×6): qty 1

## 2020-12-31 MED ORDER — ENOXAPARIN SODIUM 40 MG/0.4ML IJ SOSY
40.0000 mg | PREFILLED_SYRINGE | INTRAMUSCULAR | Status: DC
  Administered 2020-12-31 – 2021-01-01 (×2): 40 mg via SUBCUTANEOUS
  Filled 2020-12-31 (×2): qty 0.4

## 2020-12-31 MED ORDER — INSULIN ASPART 100 UNIT/ML IJ SOLN
0.0000 [IU] | Freq: Three times a day (TID) | INTRAMUSCULAR | Status: DC
  Administered 2021-01-01 – 2021-01-04 (×2): 3 [IU] via SUBCUTANEOUS

## 2020-12-31 MED ORDER — ONDANSETRON HCL 4 MG/2ML IJ SOLN
4.0000 mg | Freq: Four times a day (QID) | INTRAMUSCULAR | Status: DC | PRN
  Administered 2020-12-31 – 2021-01-05 (×3): 4 mg via INTRAVENOUS
  Filled 2020-12-31 (×2): qty 2

## 2020-12-31 MED ORDER — PIPERACILLIN-TAZOBACTAM 3.375 G IVPB
3.3750 g | Freq: Three times a day (TID) | INTRAVENOUS | Status: DC
  Administered 2020-12-31 – 2021-01-03 (×10): 3.375 g via INTRAVENOUS
  Filled 2020-12-31 (×11): qty 50

## 2020-12-31 MED ORDER — AMLODIPINE BESYLATE 5 MG PO TABS
5.0000 mg | ORAL_TABLET | Freq: Every day | ORAL | Status: DC
  Administered 2020-12-31 – 2021-01-05 (×6): 5 mg via ORAL
  Filled 2020-12-31 (×6): qty 1

## 2020-12-31 MED ORDER — SODIUM CHLORIDE 0.9 % IV SOLN
500.0000 mg | INTRAVENOUS | Status: DC
  Administered 2020-12-31 – 2021-01-03 (×4): 500 mg via INTRAVENOUS
  Filled 2020-12-31 (×4): qty 500

## 2020-12-31 MED ORDER — ACETAMINOPHEN 325 MG PO TABS
650.0000 mg | ORAL_TABLET | Freq: Four times a day (QID) | ORAL | Status: DC | PRN
  Administered 2020-12-31 – 2021-01-03 (×4): 650 mg via ORAL
  Filled 2020-12-31 (×4): qty 2

## 2020-12-31 MED ORDER — ONDANSETRON HCL 4 MG PO TABS
4.0000 mg | ORAL_TABLET | Freq: Four times a day (QID) | ORAL | Status: DC | PRN
  Filled 2020-12-31: qty 1

## 2020-12-31 MED ORDER — LACTATED RINGERS IV SOLN: INTRAVENOUS | Status: AC

## 2020-12-31 MED ORDER — DIAZEPAM 5 MG PO TABS
2.5000 mg | ORAL_TABLET | Freq: Two times a day (BID) | ORAL | Status: DC | PRN
  Administered 2020-12-31 – 2021-01-01 (×3): 2.5 mg via ORAL
  Filled 2020-12-31 (×3): qty 1

## 2020-12-31 MED ORDER — QUETIAPINE FUMARATE 100 MG PO TABS
100.0000 mg | ORAL_TABLET | Freq: Every day | ORAL | Status: DC
  Administered 2020-12-31 – 2021-01-04 (×5): 100 mg via ORAL
  Filled 2020-12-31 (×5): qty 1

## 2020-12-31 MED ORDER — PANTOPRAZOLE SODIUM 40 MG PO TBEC
40.0000 mg | DELAYED_RELEASE_TABLET | Freq: Every morning | ORAL | Status: DC
  Administered 2020-12-31 – 2021-01-05 (×6): 40 mg via ORAL
  Filled 2020-12-31 (×5): qty 1

## 2020-12-31 MED ORDER — ATORVASTATIN CALCIUM 40 MG PO TABS
40.0000 mg | ORAL_TABLET | Freq: Every day | ORAL | Status: DC
  Administered 2020-12-31 – 2021-01-04 (×6): 40 mg via ORAL
  Filled 2020-12-31 (×6): qty 1

## 2020-12-31 NOTE — ED Notes (Signed)
Hospital bed ordered.

## 2020-12-31 NOTE — ED Notes (Signed)
Pt transferred to hospital bed for comfort.

## 2020-12-31 NOTE — ED Notes (Signed)
Pt given incentive spirometer and instructed on use, pt verbalized understanding

## 2020-12-31 NOTE — Progress Notes (Signed)
Seen examined agree with POC  Patintet admitted ealrier this am with falls Coincidental CT showeing abd issues/Ct abd suggest PNA  she has no abd pain, no n,v,cp, fever currently When I take her off oxygen she desat to 76 She wants to eat  Denies fever, cp, v,v,darr at thsi time  Denies abd pain   P Rpt CXR in am r/o PNA--IS till then Cont broad spect abx, IVF OOB to chair--she has had several falls since 10/03/2022 whehn husband died--will need PT eval to clear for home  Will d/c family once PT can see  Verneita Griffes, MD [facility] 8:34 AM   No charge

## 2020-12-31 NOTE — Progress Notes (Signed)
Patient admitted to 5W from ED. Patient is alert and oriented x4. Vital signs are stable and she is on 3L of oxygen. Has no complaints of pain. Skin is intact, no signs of skin breakdown noted on exam. Patient belongings at bedside(clothing, shoes, cell phone, glasses). The patient was shown how to use the call bell. Call bell, phone and bedside table are within reach; bed is in the lowest position.

## 2020-12-31 NOTE — Evaluation (Signed)
Physical Therapy Evaluation Patient Details Name: Kathryn Wall MRN: [id number] DOB: [birth year] Today's Date: 12/31/2020   History of Present Illness  Pt is 75 yo female admitted on 12/30/20 with weakness and falls.  Pt found to have acute diverticulitis and PNE. She has medical hx of DM2, GERD, HTN, hyperlipidemia, renal disease, breast malignancy with lumpectomy and radiation in Oct 05, 1995.  Clinical Impression  Pt admitted with above diagnosis. Pt normally lives independently and ambulates with AD.  She has had increased falls since 10/05/22 when spouse passed away with further increases in the past week due to weakness.  She was getting outpt PT to address.  Today, pt requiring min A for transfers and ambulated short distance.  She did fatigue easily.  Expect pt to progress well and recommend continued use of AD and likely resuming outpt PT. Pt currently with functional limitations due to the deficits listed below (see PT Problem List). Pt will benefit from skilled PT to increase their independence and safety with mobility to allow discharge to the venue listed below.       Follow Up Recommendations Outpatient PT (resume outpt PT)    Equipment Recommendations  None recommended by PT    Recommendations for Other Services       Precautions / Restrictions Precautions Precautions: Fall      Mobility  Bed Mobility Overal bed mobility: Needs Assistance Bed Mobility: Supine to Sit;Sit to Supine     Supine to sit: Supervision Sit to supine: Supervision        Transfers Overall transfer level: Needs assistance Equipment used: 1 person hand held assist Transfers: Sit to/from Stand Sit to Stand: Min assist         General transfer comment: Performed x 3 with min A to rise and pt required multiple attempts, increased time, and cues to lean forward  Ambulation/Gait Ambulation/Gait assistance: Min assist Gait Distance (Feet): 80 Feet Assistive device: 1 person hand held assist Gait  Pattern/deviations: Step-through pattern;Decreased stride length Gait velocity: decreased   General Gait Details: Mild instability but ambulating without AD  Stairs            Wheelchair Mobility    Modified Rankin (Stroke Patients Only)       Balance Overall balance assessment: Needs assistance;History of Falls Sitting-balance support: No upper extremity supported Sitting balance-Leahy Scale: Good     Standing balance support: No upper extremity supported Standing balance-Leahy Scale: Fair Standing balance comment: Requiring HHA to ambulate ; could static stand wihtout support                             Pertinent Vitals/Pain Pain Assessment: No/denies pain    Home Living Family/patient expects to be discharged to:: Private residence Living Arrangements: Alone Available Help at Discharge: Family;Available PRN/intermittently (dtr lives next door; also has life alert) Type of Home: House Home Access: Stairs to enter Entrance Stairs-Rails: Right;Left;Can reach both Entrance Stairs-Number of Steps: 4 Home Layout: One level Home Equipment: Clinical cytogeneticist - 2 wheels;Cane - single point;Bedside commode;Walker - 4 wheels      Prior Function Level of Independence: Needs assistance   Gait / Transfers Assistance Needed: Uses a Rollator or cane - frequent falls over past month; going to outpt PT started last week; can ambulate in community; has difficulty getting up from low surfaces  ADL's / Homemaking Assistance Needed: Reports independent with ADLs and light IADLs  Hand Dominance        Extremity/Trunk Assessment   Upper Extremity Assessment Upper Extremity Assessment: LUE deficits/detail;RUE deficits/detail RUE Deficits / Details: ROM WFL; MMT 4/5 LUE Deficits / Details: ROM WFL; MMT 4/5 (slightly weaker than R but reports previous injury on L)    Lower Extremity Assessment Lower Extremity Assessment: LLE deficits/detail;RLE  deficits/detail RLE Deficits / Details: ROM WFL; MMT 4/5 LLE Deficits / Details: ROM WFL; MMT 4/5    Cervical / Trunk Assessment Cervical / Trunk Assessment: Normal  Communication      Cognition Arousal/Alertness: Awake/alert Behavior During Therapy: WFL for tasks assessed/performed Overall Cognitive Status: Within Functional Limits for tasks assessed                                        General Comments General comments (skin integrity, edema, etc.): VSS - Pt on 2 L with sats 97%, tried RA for mobility and maintained 92%.  Did place back on O2 post treatment. Daughter present throuhgout session.  Also, noted pt with purewick - encouraged her to be up to bathroom with nursing in order to maintain strength - could use purewick for backup but should be up with staff    Exercises     Assessment/Plan    PT Assessment Patient needs continued PT services  PT Problem List Decreased strength;Decreased mobility;Decreased safety awareness;Decreased coordination;Decreased activity tolerance;Cardiopulmonary status limiting activity;Decreased balance;Decreased knowledge of use of DME       PT Treatment Interventions DME instruction;Therapeutic activities;Gait training;Therapeutic exercise;Patient/family education;Stair training;Balance training;Functional mobility training;Neuromuscular re-education    PT Goals (Current goals can be found in the Care Plan section)  Acute Rehab PT Goals Patient Stated Goal: return home PT Goal Formulation: With patient/family Time For Goal Achievement: 01/14/21 Potential to Achieve Goals: Good    Frequency Min 3X/week   Barriers to discharge        Co-evaluation               AM-PAC PT "6 Clicks" Mobility  Outcome Measure Help needed turning from your back to your side while in a flat bed without using bedrails?: None Help needed moving from lying on your back to sitting on the side of a flat bed without using bedrails?: A  Little Help needed moving to and from a bed to a chair (including a wheelchair)?: A Little Help needed standing up from a chair using your arms (e.g., wheelchair or bedside chair)?: A Little Help needed to walk in hospital room?: A Little Help needed climbing 3-5 steps with a railing? : A Little 6 Click Score: 19    End of Session Equipment Utilized During Treatment: Gait belt Activity Tolerance: Patient tolerated treatment well Patient left: in bed;with call bell/phone within reach;with family/visitor present Nurse Communication: Mobility status PT Visit Diagnosis: Unsteadiness on feet (R26.81);Muscle weakness (generalized) (M62.81);Repeated falls (R29.6)    Time: 1540-0867 PT Time Calculation (min) (ACUTE ONLY): 24 min   Charges:   PT Evaluation $PT Eval Low Complexity: 1 Low PT Treatments $Gait Training: 8-22 mins        Abran Richard, PT Acute Rehab Services Pager [phone] [facility] [place] 12/31/2020, 4:02 PM

## 2020-12-31 NOTE — H&P (Signed)
History and Physical    Kathryn Wall PET:[id number] DOB: August 11, 1945 DOA: 12/30/2020  PCP: [name], MD  Patient coming from: Home   Chief Complaint:  Chief Complaint  Patient presents with   Fall     HPI:    75 year old female with past medical history of diabetes mellitus type 2, gastroesophageal reflux disease, hypertension, hyperlipidemia, chronic kidney disease stage IIIa and remote history of breast malignancy in 1997 (S/P lumpectomy and radiation) presenting to [facility] emergency department with complaints of weakness and fall.  Patient explains that for at least the past week she has been experiencing generalized weakness.  This generalized weakness was initially mild in intensity but progressively became more more severe.  Weakness is worse with exertion and improved with rest.  Weakness has progressed to the point where patient is having difficulty performing her ADLs, resulting in the patient falling earlier in the day on 7/5.  Patient reports that she has also been experiencing some mild dyspnea on exertion over the span of time but denies associated cough.  Patient additionally reports some associated watery nonbloody stools over the past several days.  Patient denies recent travel, sick contacts or contact with confirmed COVID-19 infection.  Upon further questioning patient denies abdominal pain, denies nausea, vomiting, fevers or changes in appetite.  Due to patient's progressively worsening weakness and falls the patient presented to [facility] emergency department for evaluation.  Upon evaluation in the emergency department patient was found to be febrile with temperature as high as 102.3 F with associated tachycardia and tachypnea with leukocytosis of 18.3.  Patient was noted to have abdominal tenderness on examination and therefore CT imaging of the abdomen and pelvis was performed.  This revealed diverticulosis with fat stranding the  descending and sigmoid colon concerning for possible diverticulitis.  Patient was also noted to have evidence of a right lower lobe infiltrate concerning for early pneumonia.  Patient was initiated on intravenous antibiotic therapy in the hospital scrip was called to assess the patient for admission to the hospital.  Review of Systems:   Review of Systems  Neurological:  Positive for weakness.  All other systems reviewed and are negative.  Past Medical History:  Diagnosis Date   Breast cancer ([place]) 1997   AGE 41, BRCA 1 NEGATIVE 2. 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    CKD (chronic kidney disease), stage III (HCC)    Diabetes mellitus    TYPE II   High cholesterol    Hypertension     Past Surgical History:  Procedure Laterality Date   [place]   RIGHT BREAST LUMPECTOMY   CHOLECYSTECTOMY  1980     reports that she has never smoked. She has never used smokeless tobacco. She reports that she does not drink alcohol and does not use drugs.  Allergies  Allergen Reactions   Allergen [Antipyrine-Benzocaine] Other (See Comments)    Unknown Reaction   Cymbalta [Duloxetine Hcl] Other (See Comments)    Unknown Reaction   Other     SENSITIVE TO ANTIBIOTICS   Ciprofloxacin Nausea Only    Nausea    Doxazosin Nausea And Vomiting   Percocet [Oxycodone-Acetaminophen] Nausea And Vomiting    Family History  Problem Relation Age of Onset   Cancer Mother        COLON   Hypertension Father    Heart disease Father  Prior to Admission medications   Medication Sig Start Date End Date Taking? Authorizing Provider  amoxicillin-clavulanate (AUGMENTIN) 875-125 MG tablet Take 1 tablet by mouth every 12 (twelve) hours. 12/30/20  Yes Horton, Alvin Critchley, DO  amLODipine (NORVASC) 5 MG tablet TAKE 1 TABLET BY MOUTH ONCE DAILY 08/14/20   [name], MD  Ascorbic Acid (VITA-C PO) Take by mouth.    [provider]   atorvastatin (LIPITOR) 40 MG tablet TAKE 1 TABLET BY MOUTH AT BEDTIME 10/22/20   [name], MD  diazepam (VALIUM) 5 MG tablet TAKE 1 TABLET BY MOUTH EVERY 12 HOURS ASNEEDED FOR ANXIETY (SLEEP). 12/30/20   [name], MD  JARDIANCE 25 MG TABS tablet TAKE 1 TABLET BY MOUTH ONCE A DAY 09/08/20   [name], MD  losartan (COZAAR) 50 MG tablet TAKE 1 TABLET BY MOUTH ONCE A DAY 09/16/20   [name], MD  meloxicam (MOBIC) 15 MG tablet Take 1 tablet (15 mg total) by mouth daily. 10/23/20   [name], MD  nystatin ointment (MYCOSTATIN) APPLY TO AFFECTED AREAS TWICE DAILY 11/10/20   [name], MD  pantoprazole (PROTONIX) 40 MG tablet TAKE 1 TABLET BY MOUTH EVERY MORNING 06/25/20   [name], MD  PARoxetine (PAXIL) 20 MG tablet Take 1 tablet (20 mg total) by mouth daily. 09/05/20   [name], MD  pioglitazone (ACTOS) 30 MG tablet Take 1 tablet (30 mg total) by mouth daily. 08/20/20   [name], MD  sitaGLIPtin-metformin (JANUMET) 50-1000 MG tablet TAKE 1 TABLET BY MOUTH TWICE A DAY WITH A MEAL 12/30/20   [name], MD  VITAMIN D PO Take by mouth.    [provider]    Physical Exam: Vitals:   12/31/20 0300 12/31/20 0315 12/31/20 0328 12/31/20 0330  BP: 133/80 122/77  122/77  Pulse: 66 63 70 70  Resp:   (!) 25 (!) 25  Temp:  98.6 F (37 C)  98.6 F (37 C)  TempSrc:  Oral  Oral  SpO2: 95% 94% 96% 96%    Constitutional: Lethargic arousable, oriented x3.  No associated distress.   Skin: no rashes, no lesions, poor skin turgor noted. Eyes: Pupils are equally reactive to light.  No evidence of scleral icterus or conjunctival pallor.  ENMT: Dry mucous membranes noted.  Posterior pharynx clear of any exudate or lesions.   Neck: normal, supple, no masses, no thyromegaly.  No evidence of jugular venous distension.   Respiratory: clear to auscultation bilaterally, no wheezing, no crackles. Normal respiratory effort. No accessory  muscle use.  Cardiovascular: Notable coarse breath sounds throughout the right lung fields, worse in the right base.  No evidence of associated wheezing.  No extremity edema. 2+ pedal pulses. No carotid bruits.  Chest:   Nontender without crepitus or deformity.   Back:   Nontender without crepitus or deformity. Abdomen: Notable lower abdominal tenderness, left lower quadrant worse than right.  No evidence of intra-abdominal masses.  Positive bowel sounds noted in all quadrants.   Musculoskeletal: No joint deformity upper and lower extremities. Good ROM, no contractures. Normal muscle tone.  Neurologic: CN 2-12 grossly intact. Sensation intact.  Patient moving all 4 extremities spontaneously.  Patient is following all commands.  Patient is responsive to verbal stimuli.   Psychiatric: Patient exhibits normal mood with appropriate affect.  Patient seems to possess insight as to their current situation.     Labs on Admission: I have personally  reviewed following labs and imaging studies -   CBC: Recent Labs  Lab 12/30/20 1930  WBC 18.3*  NEUTROABS 14.9*  HGB 12.1  HCT 37.9  MCV 90.9  PLT 563   Basic Metabolic Panel: Recent Labs  Lab 12/30/20 1930  NA 132*  K 3.5  CL 97*  CO2 22  GLUCOSE 134*  BUN 14  CREATININE 1.13*  CALCIUM 9.0   GFR: CrCl cannot be calculated (Unknown ideal weight.). Liver Function Tests: Recent Labs  Lab 12/30/20 1930  AST 45*  ALT 20  ALKPHOS 65  BILITOT 0.9  PROT 7.6  ALBUMIN 3.8   Recent Labs  Lab 12/30/20 1930  LIPASE 36   No results for input(s): AMMONIA in the last 168 hours. Coagulation Profile: No results for input(s): INR, PROTIME in the last 168 hours. Cardiac Enzymes: No results for input(s): CKTOTAL, CKMB, CKMBINDEX, TROPONINI in the last 168 hours. BNP (last 3 results) No results for input(s): PROBNP in the last 8760 hours. HbA1C: No results for input(s): HGBA1C in the last 72 hours. CBG: No results for input(s): GLUCAP in  the last 168 hours. Lipid Profile: No results for input(s): CHOL, HDL, LDLCALC, TRIG, CHOLHDL, LDLDIRECT in the last 72 hours. Thyroid Function Tests: No results for input(s): TSH, T4TOTAL, FREET4, T3FREE, THYROIDAB in the last 72 hours. Anemia Panel: No results for input(s): VITAMINB12, FOLATE, FERRITIN, TIBC, IRON, RETICCTPCT in the last 72 hours. Urine analysis:    Component Value Date/Time   COLORURINE YELLOW 12/30/2020 2220   APPEARANCEUR HAZY (A) 12/30/2020 2220   LABSPEC 1.026 12/30/2020 2220   PHURINE 5.0 12/30/2020 2220   GLUCOSEU >=500 (A) 12/30/2020 2220   HGBUR MODERATE (A) 12/30/2020 2220   BILIRUBINUR NEGATIVE 12/30/2020 2220   KETONESUR 20 (A) 12/30/2020 2220   PROTEINUR 100 (A) 12/30/2020 2220   UROBILINOGEN 0.2 01/13/2015 1201   NITRITE NEGATIVE 12/30/2020 2220   LEUKOCYTESUR SMALL (A) 12/30/2020 2220    Radiological Exams on Admission - Personally Reviewed: DG Chest 1 View  Result Date: 12/30/2020 CLINICAL DATA:  Generalized weakness.  Fall 2 hours ago EXAM: CHEST  1 VIEW COMPARISON:  02/21/2020 FINDINGS: The heart size and mediastinal contours are within normal limits. Both lungs are clear. The visualized skeletal structures are unremarkable. IMPRESSION: No active disease. Electronically Signed   By: Miachel Roux M.D.   On: 12/30/2020 13:26   CT Abdomen Pelvis W Contrast  Result Date: 12/30/2020 CLINICAL DATA:  Acute abdominal pain. Two falls in the last 24 hours, slipped in the bathroom. Diarrhea. EXAM: CT ABDOMEN AND PELVIS WITH CONTRAST TECHNIQUE: Multidetector CT imaging of the abdomen and pelvis was performed using the standard protocol following bolus administration of intravenous contrast. CONTRAST:  50m OMNIPAQUE IOHEXOL 300 MG/ML  SOLN COMPARISON:  Remote abdominopelvic CT 05/27/2011 in 01/07/2006. Chest radiograph earlier this day FINDINGS: Lower chest: Patchy dependent opacity in the right lower lobe, greater than typically seen with atelectasis. No pleural  fluid. Hepatobiliary: Diffuse hepatic steatosis. Area of low-density in the subcapsular right lobe measuring 2.9 cm likely represents a hemangioma, and is slightly smaller than on 2007 exam. No additional focal hepatic lesion. Clips in the gallbladder fossa postcholecystectomy. No biliary dilatation. Pancreas: No ductal dilatation or inflammation. Spleen: Normal in size without focal abnormality. Splenule at the hilum. Adrenals/Urinary Tract: Normal adrenal glands. Lobulated bilateral renal contours with cortical thinning and areas of scarring. Small low-density cortical lesions are likely cysts but too small to accurately characterize. There is symmetric excretion on delayed phase  imaging as well as homogeneous renal enhancement partially distended urinary bladder without wall thickening. Stomach/Bowel: Decompressed stomach. Unremarkable duodenum. Occasional loops of fluid-filled nondilated bowel in the pelvis without wall thickening or inflammation. Appendectomy. Multifocal colonic diverticula the equivocal faint fat stranding about junction of distal descending and sigmoid colon, series 6, image 72, possible mild diverticulitis. No colonic wall thickening. Vascular/Lymphatic: Moderate aortic atherosclerosis. Patent portal vein. No acute vascular findings. No enlarged lymph nodes in the abdomen or pelvis Reproductive: Hysterectomy.  No adnexal mass.  Quiescent ovaries. Other: No ascites or free air no abdominal wall hernia Musculoskeletal: Scoliosis and diffuse degenerative change throughout the spine. There is a prominent Schmorl's node in T9 superior endplate. No acute osseous abnormalities. No fracture of included ribs, spine, or pelvis. IMPRESSION: 1. Colonic diverticulosis with equivocal faint fat stranding about the junction of distal descending and sigmoid colon, possible mild diverticulitis. No perforation or abscess. 2. Patchy dependent opacity in the right lower lobe. This is greater than typically seen  with atelectasis, and could represent pneumonia in the appropriate clinical setting. 3. Bilateral renal cortical scarring. Aortic Atherosclerosis (ICD10-I70.0). Electronically Signed   By: Keith Rake M.D.   On: 12/30/2020 22:36    EKG: Personally reviewed.  Rhythm is atrial fibrillation with heart rate of 110 bpm.  No dynamic ST segment changes appreciated.  Assessment/Plan Principal Problem:   Acute diverticulitis  Patient presenting with 1 week history of progressively worsening generalized weakness and intermittent diarrhea with notable lower abdominal tenderness on examination in the setting of multiple SIRS criteria including substantial leukocytosis CT imaging of the abdomen and pelvis consistent with developing diverticulitis of the descending and sigmoid colon. Patient placed on intravenous Zosyn for gram-negative and anaerobic coverage Gentle intravenous hydration Clear liquid diet for now Advance diet slowly as tolerated  Active Problems: Pneumonia of right lower lobe due to infectious organism  Incidental finding of concurrent right lower lobe pneumonia on CT imaging of the abdomen and pelvis Patient denies, but does complain of some dyspnea on exertion that has developed over the past week Providing patient with intravenous azithromycin in addition to Zosyn for diverticulitis Gentle intravenous hydration Supplemental oxygen as necessary for bouts of hypoxia  Generalized weakness   Fall at home, initial encounter    Fall thought to be secondary to progressively worsening generalized weakness due to diverticulitis and pneumonia with volume depletion No evidence of loss of consciousness or head trauma PT evaluation    SIRS (systemic inflammatory response syndrome) ([place])  Patient presenting with multiple SIRS criteria including tachycardia, tachypnea, fever and leukocytosis Patient exhibits multiple sources of infection including possible diverticulitis and right lower  lobe pneumonia Monitor closely for development of organ dysfunction/sepsis    Essential hypertension, benign  Continue home regimen antihypertensive therapy while monitoring closely for bouts of hypotension    Type 2 diabetes mellitus with stage 3a chronic kidney disease, without long-term current use of insulin ([place])  Patient been placed on Accu-Cheks before every meal and nightly with sliding scale insulin Holding home regimen of hypoglycemics Hemoglobin A1C ordered Diabetic Diet    Chronic kidney disease, stage 3a (HCC)  Strict intake and output monitoring Creatinine near baseline Minimizing nephrotoxic agents as much as possible Serial chemistries to monitor renal function and electrolytes    Mixed diabetic hyperlipidemia associated with type 2 diabetes mellitus ([place])  Continuing home regimen of lipid lowering therapy.    GERD without esophagitis Continuing home regimen of daily PPI therapy.     Code Status:  Full code Family Communication: deferred   Status is: Observation  The patient remains OBS appropriate and will d/c before 2 midnights.  Dispo: The patient is from: Home              Anticipated d/c is to: Home              Patient currently is not medically stable to d/c.   Difficult to place patient No        Vernelle Emerald MD [facility] Pager [phone]  If 7PM-7AM, please contact night-coverage [email or web address] Use universal [place] password for that web site. If you do not have the password, please call the hospital operator.  12/31/2020, 5:24 AM  .Mariana Kaufman

## 2021-01-01 ENCOUNTER — Inpatient Hospital Stay (HOSPITAL_COMMUNITY): Payer: Medicare Other

## 2021-01-01 LAB — GLUCOSE, CAPILLARY
Glucose-Capillary: 114 mg/dL — ABNORMAL HIGH (ref 70–99)
Glucose-Capillary: 101 mg/dL — ABNORMAL HIGH (ref 70–99)
Glucose-Capillary: 156 mg/dL — ABNORMAL HIGH (ref 70–99)
Glucose-Capillary: 97 mg/dL (ref 70–99)
Glucose-Capillary: 94 mg/dL (ref 70–99)

## 2021-01-01 LAB — COMPREHENSIVE METABOLIC PANEL
ALT: 37 U/L (ref 0–44)
AST: 64 U/L — ABNORMAL HIGH (ref 15–41)
Albumin: 2.8 g/dL — ABNORMAL LOW (ref 3.5–5.0)
Alkaline Phosphatase: 69 U/L (ref 38–126)
Anion gap: 9 (ref 5–15)
BUN: 15 mg/dL (ref 8–23)
CO2: 24 mmol/L (ref 22–32)
Calcium: 8.4 mg/dL — ABNORMAL LOW (ref 8.9–10.3)
Chloride: 103 mmol/L (ref 98–111)
Creatinine, Ser: 1.1 mg/dL — ABNORMAL HIGH (ref 0.44–1.00)
GFR, Estimated: 52 mL/min — ABNORMAL LOW (ref >60)
Glucose, Bld: 94 mg/dL (ref 70–99)
Potassium: 3.4 mmol/L — ABNORMAL LOW (ref 3.5–5.1)
Sodium: 136 mmol/L (ref 135–145)
Total Bilirubin: 0.9 mg/dL (ref 0.3–1.2)
Total Protein: 6.4 g/dL — ABNORMAL LOW (ref 6.5–8.1)

## 2021-01-01 LAB — CBC WITH DIFFERENTIAL/PLATELET
Abs Immature Granulocytes: 0.09 10*3/uL — ABNORMAL HIGH (ref 0.00–0.07)
Basophils Absolute: 0 10*3/uL (ref 0.0–0.1)
Basophils Relative: 0 %
Eosinophils Absolute: 0.2 10*3/uL (ref 0.0–0.5)
Eosinophils Relative: 2 %
HCT: 33.9 % — ABNORMAL LOW (ref 36.0–46.0)
Hemoglobin: 10.9 g/dL — ABNORMAL LOW (ref 12.0–15.0)
Immature Granulocytes: 1 %
Lymphocytes Relative: 13 %
Lymphs Abs: 1.5 10*3/uL (ref 0.7–4.0)
MCH: 29.4 pg (ref 26.0–34.0)
MCHC: 32.2 g/dL (ref 30.0–36.0)
MCV: 91.4 fL (ref 80.0–100.0)
Monocytes Absolute: 1 10*3/uL (ref 0.1–1.0)
Monocytes Relative: 9 %
Neutro Abs: 8.6 10*3/uL — ABNORMAL HIGH (ref 1.7–7.7)
Neutrophils Relative %: 75 %
Platelets: 226 10*3/uL (ref 150–400)
RBC: 3.71 MIL/uL — ABNORMAL LOW (ref 3.87–5.11)
RDW: 15 % (ref 11.5–15.5)
WBC: 11.4 10*3/uL — ABNORMAL HIGH (ref 4.0–10.5)
nRBC: 0 % (ref 0.0–0.2)

## 2021-01-01 LAB — URINE CULTURE: Culture: >=100000 — AB

## 2021-01-01 LAB — TSH: TSH: 1.287 u[IU]/mL (ref 0.350–4.500)

## 2021-01-01 LAB — TROPONIN I (HIGH SENSITIVITY)
Troponin I (High Sensitivity): 21 ng/L — ABNORMAL HIGH (ref <18)
Troponin I (High Sensitivity): 24 ng/L — ABNORMAL HIGH (ref <18)

## 2021-01-01 MED ORDER — DILTIAZEM HCL 25 MG/5ML IV SOLN
10.0000 mg | Freq: Once | INTRAVENOUS | Status: AC
  Administered 2021-01-01: 10 mg via INTRAVENOUS
  Filled 2021-01-01: qty 5

## 2021-01-01 MED ORDER — HEPARIN (PORCINE) 25000 UT/250ML-% IV SOLN
1050.0000 [IU]/h | INTRAVENOUS | Status: DC
  Administered 2021-01-01 – 2021-01-02 (×2): 1050 [IU]/h via INTRAVENOUS
  Filled 2021-01-01 (×2): qty 250

## 2021-01-01 MED ORDER — HEPARIN BOLUS VIA INFUSION
2000.0000 [IU] | Freq: Once | INTRAVENOUS | Status: AC
  Administered 2021-01-01: 2000 [IU] via INTRAVENOUS
  Filled 2021-01-01: qty 2000

## 2021-01-01 MED ORDER — METFORMIN HCL 500 MG PO TABS
1000.0000 mg | ORAL_TABLET | Freq: Two times a day (BID) | ORAL | Status: DC
  Administered 2021-01-01 – 2021-01-05 (×8): 1000 mg via ORAL
  Filled 2021-01-01 (×9): qty 2

## 2021-01-01 MED ORDER — DILTIAZEM HCL 25 MG/5ML IV SOLN
10.0000 mg | Freq: Once | INTRAVENOUS | Status: DC
  Filled 2021-01-01: qty 5

## 2021-01-01 MED ORDER — LINAGLIPTIN 5 MG PO TABS
5.0000 mg | ORAL_TABLET | Freq: Every day | ORAL | Status: DC
  Administered 2021-01-02 – 2021-01-05 (×4): 5 mg via ORAL
  Filled 2021-01-01 (×4): qty 1

## 2021-01-01 MED ORDER — MAGNESIUM OXIDE -MG SUPPLEMENT 400 (240 MG) MG PO TABS
400.0000 mg | ORAL_TABLET | Freq: Two times a day (BID) | ORAL | Status: DC
  Administered 2021-01-01 – 2021-01-05 (×9): 400 mg via ORAL
  Filled 2021-01-01 (×11): qty 1

## 2021-01-01 MED ORDER — EMPAGLIFLOZIN 25 MG PO TABS
25.0000 mg | ORAL_TABLET | Freq: Every day | ORAL | Status: DC
  Administered 2021-01-01 – 2021-01-05 (×5): 25 mg via ORAL
  Filled 2021-01-01 (×5): qty 1

## 2021-01-01 MED ORDER — SITAGLIPTIN PHOS-METFORMIN HCL 50-1000 MG PO TABS: 1.0000 | ORAL_TABLET | Freq: Two times a day (BID) | ORAL | Status: DC

## 2021-01-01 MED ORDER — METOPROLOL TARTRATE 25 MG PO TABS
25.0000 mg | ORAL_TABLET | Freq: Two times a day (BID) | ORAL | Status: DC
  Administered 2021-01-01 – 2021-01-02 (×3): 25 mg via ORAL
  Filled 2021-01-01 (×3): qty 1
  Filled 2021-01-01: qty 2

## 2021-01-01 NOTE — Significant Event (Signed)
Patient went into atrial fibrillation with RVR.  Blood pressure was 955O over 31A systolic and diastolic.  I have ordered 1 dose of Cardizem 10 mg IV bolus and heparin infusion given the patient's CHA2DS2-VASc score of at least 4.  Will check TSH cardiac markers 2D echo and consult cardiology.  Gean Birchwood

## 2021-01-01 NOTE — Significant Event (Signed)
Patient was found to be confused after waking up from sleep.  Patient did receive 1 dose of Valium at 8 PM.  On exam at bedside patient is still febrile but following commands moving all extremities.  Patient is oriented to place person and time.  There was some concern for possible dilation of the right side of the pupil when compared to left.  But reacting to light.  CBG 114.  CT head unremarkable.  Discussed with neurologist.  Advised to avoid Valium if possible.  Kathryn Wall

## 2021-01-01 NOTE — Progress Notes (Signed)
ANTICOAGULATION CONSULT NOTE - Initial Consult  Pharmacy Consult for Heparin Indication: New Afib w/ RVR  Patient Measurements: Height: 5\' 5"  (165.1 cm) IBW/kg (Calculated) : 57 Heparin Dosing Weight: 73.6 kg  Vital Signs: Temp: 99.5 F (37.5 C) (07/07 1952) Temp Source: Oral (07/07 1952) BP: 126/94 (07/07 1952) Pulse Rate: 149 (07/07 1952)  Labs: Recent Labs    12/30/20 1930 12/31/20 0806 12/31/20 1005 01/01/21 0846  HGB 12.1 11.7*  --  10.9*  HCT 37.9 38.3  --  33.9*  PLT 245 197  --  226  LABPROT  --   --  13.3  --   INR  --   --  1.0  --   CREATININE 1.13*  --  1.03* 1.10*    CrCl cannot be calculated (Unknown ideal weight.).   Assessment: 53 YOF who presented on 7/6 with weakness and fall - concern for diverticulitis and PNA. This evening the patient was noted to be in Afib with RVR - pharmacy was consulted to start Heparin for anticoagulation.   The patient has been on lovenox for VTE prophylaxis - last dose of 40 mg this AM around 1000.   Goal of Therapy:  Heparin level 0.3-0.7 units/ml Monitor platelets by anticoagulation protocol: Yes   Plan:  - Heparin 2000 unit bolus x 1 - Start Heparin at 1050 units/hr (10.5 ml/hr) - Will continue to monitor for any signs/symptoms of bleeding and will follow up with heparin level in 8 hours   Thank you for allowing pharmacy to be a part of this patient's care.  Alycia Rossetti, PharmD, BCPS Clinical Pharmacist Clinical phone for 01/01/2021: [phone] 01/01/2021 8:21 PM   **Pharmacist phone directory can now be found on [email or web address] ([email or web address]).  Listed under [place].

## 2021-01-01 NOTE — Progress Notes (Signed)
PROGRESS NOTE   Kathryn Wall  LAG:[id number] DOB: March 01, 1946 DOA: 12/30/2020 PCP: [name], MD  Brief Narrative:  75 year old white female known history HTN, DM TY 2 Breast cancer status post lumpectomy 1997 (XRT )reflux CKD 3 AA Mild cognitive issues followed by Dr. Logan Bores neurological Poor sleep schedule for sleep study currently on Valium for the same-cannot take Ambien because of sleepwalking Recent loss of husband Patient currently getting outpatient therapy at [facility], ED 7/5 weakness X 2 days + mechanical fall in addition to abdominal pain-she initially denied this to this examiner does not tell me she has chills or fevers Has not had cough T-max 100.3 WBC 18.3 abdominal tenderness left lower quadrant noted and also had on CT scan diverticulosis in addition to possible right lower lobe pneumonia  Hospital-Problem based course   Encephalopathy overnight Patient has an underlying issue with cognitive deficits-she has not had any strokelike symptoms and is moving all 4 limbs equally We will monitor her on telemetry to ensure no recurrences and do neurochecks every 4 today ?  Diverticulitis Patient downplays her symptoms some-she is tender on exam this morning Continue broad-spectrum antibiotics Zosyn azithromycin Patient is not eating I have encouraged her to do so--? 2/2 abd pain when eating ?  Right-sided pneumonia?  Aspiration Absolutely no cough or cold-2 view CXR 7/7 indeterminant to my overview and looks unchanged Antibiotics as above narrow when able DM TY 2 In the outpatient setting it looks like PCP was contemplating holding Actos 30 We have not resume this-I will resume Janumet 1 tab twice daily in addition to Jardiance 25 daily, she can continue sliding scale-CBGs 100-114 CKD 3 AA Hypokalemia replace magnesium with p.o. Mag-Ox 400 twice daily K relatively the same Cognitive deficits, recent falls Work-up performed by Dr.  Nancy Nordmann  -B12 and folate as well as ANA with reflex however abnormal--is being referred to rheumatology and is supposed to get B12 shots HTN Losartan 50, amlodipine 5 mg Longstanding anxiety Currently on numerous meds that can cause mild cognitive issues including Valium 5 Paxil 20 Seroquel 100 Would defer to her neurologist and may need down titration of meds-it looks like she has been on several neuroleptics and that BZD's Provost   DVT prophylaxis: loveneox Code Status: full  Family Communication: called daughter Tye [place] [phone] and no answer--called other daugther Abigail Butts [phone] and no answer Disposition:  Status is: Inpatient  Not inpatient appropriate, will call [facility] team and downgrade to OBS.   Dispo: The patient is from: Home              Anticipated d/c is to: Home              Patient currently is not medically stable to d/c.   Difficult to place patient No       Consultants:  None yet  Procedures:   Antimicrobials:  Azithro and Zosyn from 7/5    Subjective:  Oriented x4-does not recall events overnight Not eating or drinking-fearful for?  Abdominal pain She did not tell me this yesterday She is only taking in minimal p.o.  Objective: Vitals:   12/31/20 2048 12/31/20 2315 01/01/21 0314 01/01/21 0734  BP: (!) 162/85 117/64 105/70 (!) 142/84  Pulse: (!) 101 79 (!) 57 77  Resp: 18 15 20 16   Temp: 99.1 F (37.3 C) 98.6 F (37 C) 97.9 F (36.6 C) 97.9 F (36.6 C)  TempSrc: Oral Axillary Oral Oral  SpO2: 94% 92% 93%  94%  Height:        Intake/Output Summary (Last 24 hours) at 01/01/2021 0849 Last data filed at 12/31/2020 1530 Gross per 24 hour  Intake 400 ml  Output --  Net 400 ml   There were no vitals filed for this visit.  Examination:  Alert coherent pupils equally reactive to light no icterus no pallor Chest clear no rales no rhonchi S1-S2 no murmur Abdomen slightly tender left lower quadrant Trace edema Moving all 4 limbs equally no focal  deficit Psych euthymic pleasant  Data Reviewed: personally reviewed   CBC    Component Value Date/Time   WBC 15.1 (H) 12/31/2020 0806   RBC 3.95 12/31/2020 0806   HGB 11.7 (L) 12/31/2020 0806   HGB 11.8 12/16/2020 1524   HCT 38.3 12/31/2020 0806   HCT 36.5 12/16/2020 1524   PLT 197 12/31/2020 0806   PLT 335 12/16/2020 1524   MCV 97.0 12/31/2020 0806   MCV 91 12/16/2020 1524   MCH 29.6 12/31/2020 0806   MCHC 30.5 12/31/2020 0806   RDW 15.3 12/31/2020 0806   RDW 13.7 12/16/2020 1524   LYMPHSABS 1.2 12/31/2020 0806   LYMPHSABS 2.2 12/16/2020 1524   MONOABS 1.4 (H) 12/31/2020 0806   EOSABS 0.0 12/31/2020 0806   EOSABS 0.3 12/16/2020 1524   BASOSABS 0.1 12/31/2020 0806   BASOSABS 0.1 12/16/2020 1524   CMP Latest Ref Rng & Units 12/31/2020 12/30/2020 12/16/2020  Glucose 70 - 99 mg/dL 154(H) 134(H) 81  BUN 8 - 23 mg/dL 14 14 17   Creatinine 0.44 - 1.00 mg/dL 1.03(H) 1.13(H) 1.13(H)  Sodium 135 - 145 mmol/L 133(L) 132(L) 143  Potassium 3.5 - 5.1 mmol/L 3.3(L) 3.5 4.8  Chloride 98 - 111 mmol/L 103 97(L) 102  CO2 22 - 32 mmol/L 20(L) 22 21  Calcium 8.9 - 10.3 mg/dL 8.2(L) 9.0 9.2  Total Protein 6.5 - 8.1 g/dL 6.0(L) 7.6 7.4  Total Bilirubin 0.3 - 1.2 mg/dL 0.8 0.9 0.3  Alkaline Phos 38 - 126 U/L 63 65 93  AST 15 - 41 U/L 56(H) 45(H) 17  ALT 0 - 44 U/L 25 20 11      Radiology Studies: DG Chest 1 View  Result Date: 12/30/2020 CLINICAL DATA:  Generalized weakness.  Fall 2 hours ago EXAM: CHEST  1 VIEW COMPARISON:  02/21/2020 FINDINGS: The heart size and mediastinal contours are within normal limits. Both lungs are clear. The visualized skeletal structures are unremarkable. IMPRESSION: No active disease. Electronically Signed   By: Miachel Roux M.D.   On: 12/30/2020 13:26   CT HEAD WO CONTRAST  Result Date: 01/01/2021 CLINICAL DATA:  Altered mental status EXAM: CT HEAD WITHOUT CONTRAST TECHNIQUE: Contiguous axial images were obtained from the base of the skull through the vertex  without intravenous contrast. COMPARISON:  02/22/2020 FINDINGS: Brain: No evidence of acute infarction, hemorrhage, hydrocephalus, extra-axial collection or mass lesion/mass effect. Global cortical and central atrophy. Subcortical white matter and periventricular small vessel ischemic changes. Vascular: No hyperdense vessel or unexpected calcification. Skull: Normal. Negative for fracture or focal lesion. Sinuses/Orbits: The visualized paranasal sinuses are essentially clear. The mastoid air cells are unopacified. Other: None. IMPRESSION: No evidence of acute intracranial abnormality. Atrophy with small vessel ischemic changes. Electronically Signed   By: Julian Hy M.D.   On: 01/01/2021 03:43   CT Abdomen Pelvis W Contrast  Result Date: 12/30/2020 CLINICAL DATA:  Acute abdominal pain. Two falls in the last 24 hours, slipped in the bathroom. Diarrhea. EXAM: CT ABDOMEN AND  PELVIS WITH CONTRAST TECHNIQUE: Multidetector CT imaging of the abdomen and pelvis was performed using the standard protocol following bolus administration of intravenous contrast. CONTRAST:  68mL OMNIPAQUE IOHEXOL 300 MG/ML  SOLN COMPARISON:  Remote abdominopelvic CT 05/27/2011 in 01/07/2006. Chest radiograph earlier this day FINDINGS: Lower chest: Patchy dependent opacity in the right lower lobe, greater than typically seen with atelectasis. No pleural fluid. Hepatobiliary: Diffuse hepatic steatosis. Area of low-density in the subcapsular right lobe measuring 2.9 cm likely represents a hemangioma, and is slightly smaller than on 2007 exam. No additional focal hepatic lesion. Clips in the gallbladder fossa postcholecystectomy. No biliary dilatation. Pancreas: No ductal dilatation or inflammation. Spleen: Normal in size without focal abnormality. Splenule at the hilum. Adrenals/Urinary Tract: Normal adrenal glands. Lobulated bilateral renal contours with cortical thinning and areas of scarring. Small low-density cortical lesions are  likely cysts but too small to accurately characterize. There is symmetric excretion on delayed phase imaging as well as homogeneous renal enhancement partially distended urinary bladder without wall thickening. Stomach/Bowel: Decompressed stomach. Unremarkable duodenum. Occasional loops of fluid-filled nondilated bowel in the pelvis without wall thickening or inflammation. Appendectomy. Multifocal colonic diverticula the equivocal faint fat stranding about junction of distal descending and sigmoid colon, series 6, image 72, possible mild diverticulitis. No colonic wall thickening. Vascular/Lymphatic: Moderate aortic atherosclerosis. Patent portal vein. No acute vascular findings. No enlarged lymph nodes in the abdomen or pelvis Reproductive: Hysterectomy.  No adnexal mass.  Quiescent ovaries. Other: No ascites or free air no abdominal wall hernia Musculoskeletal: Scoliosis and diffuse degenerative change throughout the spine. There is a prominent Schmorl's node in T9 superior endplate. No acute osseous abnormalities. No fracture of included ribs, spine, or pelvis. IMPRESSION: 1. Colonic diverticulosis with equivocal faint fat stranding about the junction of distal descending and sigmoid colon, possible mild diverticulitis. No perforation or abscess. 2. Patchy dependent opacity in the right lower lobe. This is greater than typically seen with atelectasis, and could represent pneumonia in the appropriate clinical setting. 3. Bilateral renal cortical scarring. Aortic Atherosclerosis (ICD10-I70.0). Electronically Signed   By: Keith Rake M.D.   On: 12/30/2020 22:36     Scheduled Meds:  amLODipine  5 mg Oral Daily   atorvastatin  40 mg Oral QHS   enoxaparin (LOVENOX) injection  40 mg Subcutaneous Q24H   insulin aspart  0-15 Units Subcutaneous TID AC & HS   losartan  50 mg Oral Daily   meloxicam  15 mg Oral Daily   pantoprazole  40 mg Oral q morning   PARoxetine  20 mg Oral Daily   QUEtiapine  100 mg Oral  QHS   Continuous Infusions:  azithromycin 500 mg (01/01/21 0136)   piperacillin-tazobactam (ZOSYN)  IV 3.375 g (01/01/21 0305)     LOS: 1 day   Time spent: 82  Nita Sells, MD [facility] To contact the attending provider between 7A-7P or the covering provider during after hours 7P-7A, please log into the web site [email or web address] and access using universal [place] password for that web site. If you do not have the password, please call the hospital operator.  01/01/2021, 8:49 AM

## 2021-01-02 ENCOUNTER — Inpatient Hospital Stay (HOSPITAL_COMMUNITY): Payer: Medicare Other

## 2021-01-02 DIAGNOSIS — I4891 Unspecified atrial fibrillation: Secondary | ICD-10-CM

## 2021-01-02 DIAGNOSIS — I480 Paroxysmal atrial fibrillation: Secondary | ICD-10-CM

## 2021-01-02 LAB — COMPREHENSIVE METABOLIC PANEL
ALT: 47 U/L — ABNORMAL HIGH (ref 0–44)
AST: 78 U/L — ABNORMAL HIGH (ref 15–41)
Albumin: 2.4 g/dL — ABNORMAL LOW (ref 3.5–5.0)
Alkaline Phosphatase: 88 U/L (ref 38–126)
Anion gap: 4 — ABNORMAL LOW (ref 5–15)
BUN: 12 mg/dL (ref 8–23)
CO2: 26 mmol/L (ref 22–32)
Calcium: 8.2 mg/dL — ABNORMAL LOW (ref 8.9–10.3)
Chloride: 105 mmol/L (ref 98–111)
Creatinine, Ser: 1.05 mg/dL — ABNORMAL HIGH (ref 0.44–1.00)
GFR, Estimated: 55 mL/min — ABNORMAL LOW (ref >60)
Glucose, Bld: 90 mg/dL (ref 70–99)
Potassium: 3.4 mmol/L — ABNORMAL LOW (ref 3.5–5.1)
Sodium: 135 mmol/L (ref 135–145)
Total Bilirubin: 0.4 mg/dL (ref 0.3–1.2)
Total Protein: 5.7 g/dL — ABNORMAL LOW (ref 6.5–8.1)

## 2021-01-02 LAB — CBC WITH DIFFERENTIAL/PLATELET
Abs Immature Granulocytes: 0.04 10*3/uL (ref 0.00–0.07)
Basophils Absolute: 0.1 10*3/uL (ref 0.0–0.1)
Basophils Relative: 1 %
Eosinophils Absolute: 0.4 10*3/uL (ref 0.0–0.5)
Eosinophils Relative: 5 %
HCT: 31.6 % — ABNORMAL LOW (ref 36.0–46.0)
Hemoglobin: 10.2 g/dL — ABNORMAL LOW (ref 12.0–15.0)
Immature Granulocytes: 1 %
Lymphocytes Relative: 22 %
Lymphs Abs: 1.9 10*3/uL (ref 0.7–4.0)
MCH: 29.4 pg (ref 26.0–34.0)
MCHC: 32.3 g/dL (ref 30.0–36.0)
MCV: 91.1 fL (ref 80.0–100.0)
Monocytes Absolute: 0.9 10*3/uL (ref 0.1–1.0)
Monocytes Relative: 10 %
Neutro Abs: 5.3 10*3/uL (ref 1.7–7.7)
Neutrophils Relative %: 61 %
Platelets: 213 10*3/uL (ref 150–400)
RBC: 3.47 MIL/uL — ABNORMAL LOW (ref 3.87–5.11)
RDW: 15 % (ref 11.5–15.5)
WBC: 8.6 10*3/uL (ref 4.0–10.5)
nRBC: 0 % (ref 0.0–0.2)

## 2021-01-02 LAB — ECHOCARDIOGRAM COMPLETE
AR max vel: 0.98 cm2
AV Area VTI: 0.88 cm2
AV Area mean vel: 0.91 cm2
AV Mean grad: 6.5 mmHg
AV Peak grad: 10.8 mmHg
Ao pk vel: 1.64 m/s
Area-P 1/2: 2.95 cm2
Calc EF: 65.2 %
Height: 65 in
MV M vel: 4.12 m/s
MV Peak grad: 67.9 mmHg
MV VTI: 0.88 cm2
Radius: 0.5 cm
S' Lateral: 2.6 cm
Single Plane A2C EF: 63 %
Single Plane A4C EF: 67.1 %
Weight: 2784 oz

## 2021-01-02 LAB — MAGNESIUM: Magnesium: 1.8 mg/dL (ref 1.7–2.4)

## 2021-01-02 LAB — GLUCOSE, CAPILLARY
Glucose-Capillary: 92 mg/dL (ref 70–99)
Glucose-Capillary: 89 mg/dL (ref 70–99)
Glucose-Capillary: 115 mg/dL — ABNORMAL HIGH (ref 70–99)
Glucose-Capillary: 148 mg/dL — ABNORMAL HIGH (ref 70–99)
Glucose-Capillary: 125 mg/dL — ABNORMAL HIGH (ref 70–99)

## 2021-01-02 LAB — HEPARIN LEVEL (UNFRACTIONATED)
Heparin Unfractionated: 0.67 IU/mL (ref 0.30–0.70)
Heparin Unfractionated: 0.69 IU/mL (ref 0.30–0.70)

## 2021-01-02 MED ORDER — DIAZEPAM 2 MG PO TABS: 2.0000 mg | ORAL_TABLET | Freq: Every evening | ORAL | Status: DC | PRN

## 2021-01-02 MED ORDER — MELOXICAM 7.5 MG PO TABS
7.5000 mg | ORAL_TABLET | Freq: Every day | ORAL | Status: DC
  Administered 2021-01-03 – 2021-01-05 (×3): 7.5 mg via ORAL
  Filled 2021-01-02 (×3): qty 1

## 2021-01-02 MED ORDER — SALINE SPRAY 0.65 % NA SOLN
1.0000 | NASAL | Status: DC | PRN
  Administered 2021-01-03: 1 via NASAL
  Filled 2021-01-02: qty 44

## 2021-01-02 MED ORDER — POTASSIUM CHLORIDE CRYS ER 20 MEQ PO TBCR
40.0000 meq | EXTENDED_RELEASE_TABLET | Freq: Once | ORAL | Status: AC
  Administered 2021-01-02: 40 meq via ORAL
  Filled 2021-01-02: qty 2

## 2021-01-02 NOTE — Progress Notes (Signed)
ANTICOAGULATION CONSULT NOTE - Follow Up Consult  Pharmacy Consult for Heparin Indication: atrial fibrillation  Allergies  Allergen Reactions   Allergen [Antipyrine-Benzocaine] Other (See Comments)    Unknown Reaction   Ambien [Zolpidem Tartrate] Other (See Comments)    Can not tolerate, causes sleepwalking   Cymbalta [Duloxetine Hcl] Other (See Comments)    Unknown Reaction   Other     SENSITIVE TO ANTIBIOTICS   Ciprofloxacin Nausea Only    Nausea    Doxazosin Nausea And Vomiting   Percocet [Oxycodone-Acetaminophen] Nausea And Vomiting    Patient Measurements: Height: 5\' 5"  (165.1 cm) Weight: 78.9 kg (174 lb) IBW/kg (Calculated) : 57 Heparin Dosing Weight:    Vital Signs: Temp: 98.7 F (37.1 C) (07/08 1131) Temp Source: Oral (07/08 1131) BP: 104/78 (07/08 1131) Pulse Rate: 63 (07/08 1131)  Labs: Recent Labs    12/31/20 0806 12/31/20 1005 01/01/21 0846 01/01/21 1939 01/01/21 2131 01/02/21 0703 01/02/21 1201  HGB 11.7*  --  10.9*  --   --  10.2*  --   HCT 38.3  --  33.9*  --   --  31.6*  --   PLT 197  --  226  --   --  213  --   LABPROT  --  13.3  --   --   --   --   --   INR  --  1.0  --   --   --   --   --   HEPARINUNFRC  --   --   --   --   --  0.67 0.69  CREATININE  --  1.03* 1.10*  --   --  1.05*  --   TROPONINIHS  --   --   --  21* 24*  --   --     Estimated Creatinine Clearance: 47.3 mL/min (A) (by C-G formula based on SCr of 1.05 mg/dL (H)).    Assessment: Anticoag: Heparin for Afib with RVR  - Hep level 0.67, confirmed 0.69 in goal. Hgb 10.2 down today. Plts stable.  Goal of Therapy:  Heparin level 0.3-0.7 units/ml Monitor platelets by anticoagulation protocol: Yes   Plan:  Continue Heparin 1050 units/hr (10.5 ml/hr) Daily HL and CBC   Blimie Vaness S. Alford Highland, PharmD, BCPS Clinical Staff Pharmacist [email or web address]  Alford Highland, [place] 01/02/2021,1:32 PM

## 2021-01-02 NOTE — Progress Notes (Signed)
PROGRESS NOTE   Kathryn Wall  IOE:[id number] DOB: Dec 01, 1945 DOA: 12/30/2020 PCP: [name], MD  Brief Narrative:  75 year old white female known history HTN, DM TY 2 Breast cancer status post lumpectomy 1997 (XRT )reflux CKD 3 AA Mild cognitive issues followed by Dr. Logan Bores neurological Poor sleep schedule for sleep study currently on Valium for the same-cannot take Ambien because of sleepwalking Recent loss of husband Patient currently getting outpatient therapy at [facility], ED 7/5 weakness X 2 days + mechanical fall in addition to abdominal pain-she initially denied this to this examiner does not tell me she has chills or fevers Has not had cough T-max 100.3 WBC 18.3 abdominal tenderness left lower quadrant noted and also had on CT scan diverticulosis in addition to possible right lower lobe pneumonia  She developed A. fib with RVR paroxysmal and cardiology was consulted-she converted rapidly on 7/7 to sinus Echo is pending-currently maintaining sinus on metoprolol Overall is improving  Hospital-Problem based course  P. Afib-occurred in setting of infection-unclear other precipitants--converted with push of Cardizem alone Await echo Heparin-->Eliquis continue metoprolol 25 twice daily ?  Diverticulitis Patient downplays her symptoms some-she is tender on exam this morning Continue broad-spectrum antibiotics Zosyn azithromycin--likely de-escalate in am 7/9 Eating fair only ?  Right-sided pneumonia?  Aspiration Absolutely no cough or cold-2 view CXR 7/7 indeterminant to my overview and looks unchanged Antibiotics as above narrow when able DM TY 2 PCP was contemplating holding Actos 30--held  resume Janumet 1 tab twice daily in addition to Jardiance 25 daily, she can continue sliding scale-CBGs 80-100's CKD 3 AA Hypokalemia replace magnesium with p.o. Mag-Ox 400 twice daily K relatively the same Encephalopathy 01/01/21 Patient has an  underlying issue with cognitive deficits-she has not had any stroke-like symptoms and is moving all 4 limbs equally MR ordered at family request Cognitive deficits, recent falls--Work-up performed by Dr. Nancy Nordmann  -B12 and folate as well as ANA with reflex however abnormal--is being referred to rheumatology and is supposed to get B12 shots HTN Losartan 50, amlodipine 5 mg Longstanding anxiety on Valium 5 Paxil 20 Seroquel 100 may need down titration of meds-it looks like she has been on several neuroleptics and that BZD's Provost   DVT prophylaxis: loveneox Code Status: full  Family Communication: family called- no answer Disposition:  Status is: Inpatient  Not inpatient appropriate, will call [facility] team and downgrade to OBS.   Dispo: The patient is from: Home              Anticipated d/c is to: Home              Patient currently is not medically stable to d/c.   Difficult to place patient No   Consultants:  None yet  Procedures:   Antimicrobials:  Azithro and Zosyn from 7/5    Subjective:  Sleepy in the am when I saw her--much more alert and coherent in the afternoon No cp no fever no chills no rigor--tells me her husband has had A. fib in the past-has never been told that she had this however Asking when can go home  Objective: Vitals:   01/02/21 0103 01/02/21 0400 01/02/21 0752 01/02/21 1131  BP: 109/80 108/61 128/74 104/78  Pulse: 83 66 73 63  Resp: 17 16 20 19   Temp: 99 F (37.2 C) 98.3 F (36.8 C) 99.2 F (37.3 C) 98.7 F (37.1 C)  TempSrc: Oral Oral Oral Oral  SpO2: 90% 90% 94% 93%  Weight:      Height:        Intake/Output Summary (Last 24 hours) at 01/02/2021 1331 Last data filed at 01/02/2021 0656 Gross per 24 hour  Intake 168.94 ml  Output 800 ml  Net -631.06 ml    Filed Weights   01/01/21 2000  Weight: 78.9 kg    Examination:  Coherent pleasant S1-S2 sinus, sinus bradycardia no recurrent A. fib-converted around 8 PM 7/7 Abdomen slight LLQ  tender Trace edema Moving all 4 limbs equally no focal deficit Psych euthymic pleasant  Data Reviewed: personally reviewed   CBC    Component Value Date/Time   WBC 8.6 01/02/2021 0703   RBC 3.47 (L) 01/02/2021 0703   HGB 10.2 (L) 01/02/2021 0703   HGB 11.8 12/16/2020 1524   HCT 31.6 (L) 01/02/2021 0703   HCT 36.5 12/16/2020 1524   PLT 213 01/02/2021 0703   PLT 335 12/16/2020 1524   MCV 91.1 01/02/2021 0703   MCV 91 12/16/2020 1524   MCH 29.4 01/02/2021 0703   MCHC 32.3 01/02/2021 0703   RDW 15.0 01/02/2021 0703   RDW 13.7 12/16/2020 1524   LYMPHSABS 1.9 01/02/2021 0703   LYMPHSABS 2.2 12/16/2020 1524   MONOABS 0.9 01/02/2021 0703   EOSABS 0.4 01/02/2021 0703   EOSABS 0.3 12/16/2020 1524   BASOSABS 0.1 01/02/2021 0703   BASOSABS 0.1 12/16/2020 1524   CMP Latest Ref Rng & Units 01/02/2021 01/01/2021 12/31/2020  Glucose 70 - 99 mg/dL 90 94 154(H)  BUN 8 - 23 mg/dL 12 15 14   Creatinine 0.44 - 1.00 mg/dL 1.05(H) 1.10(H) 1.03(H)  Sodium 135 - 145 mmol/L 135 136 133(L)  Potassium 3.5 - 5.1 mmol/L 3.4(L) 3.4(L) 3.3(L)  Chloride 98 - 111 mmol/L 105 103 103  CO2 22 - 32 mmol/L 26 24 20(L)  Calcium 8.9 - 10.3 mg/dL 8.2(L) 8.4(L) 8.2(L)  Total Protein 6.5 - 8.1 g/dL 5.7(L) 6.4(L) 6.0(L)  Total Bilirubin 0.3 - 1.2 mg/dL 0.4 0.9 0.8  Alkaline Phos 38 - 126 U/L 88 69 63  AST 15 - 41 U/L 78(H) 64(H) 56(H)  ALT 0 - 44 U/L 47(H) 37 25     Radiology Studies: DG Chest 2 View  Result Date: 01/01/2021 CLINICAL DATA:  Pneumonia.  Shortness of breath. EXAM: CHEST - 2 VIEW COMPARISON:  12/30/2020.  09/05/2020. FINDINGS: Mediastinum and hilar structures normal. Heart size normal. Prominent density, most likely infiltrate, noted along the posterior lungs on lateral view. This is difficult to identify on PA view it but is most likely on the right. Close follow-up chest x-rays recommended to demonstrate resolution. Costophrenic angles are clear, no pleural effusion noted. No pneumothorax. IMPRESSION:  Prominent density, most likely infiltrate, noted along the posterior lungs on lateral view. This is difficult to identify on the PA view but is most likely on the right. Close follow-up chest x-rays recommended demonstrate clearing. Electronically Signed   By: Marcello Moores  Register   On: 01/01/2021 09:07   CT HEAD WO CONTRAST  Result Date: 01/01/2021 CLINICAL DATA:  Altered mental status EXAM: CT HEAD WITHOUT CONTRAST TECHNIQUE: Contiguous axial images were obtained from the base of the skull through the vertex without intravenous contrast. COMPARISON:  02/22/2020 FINDINGS: Brain: No evidence of acute infarction, hemorrhage, hydrocephalus, extra-axial collection or mass lesion/mass effect. Global cortical and central atrophy. Subcortical white matter and periventricular small vessel ischemic changes. Vascular: No hyperdense vessel or unexpected calcification. Skull: Normal. Negative for fracture or focal lesion. Sinuses/Orbits: The visualized paranasal sinuses are essentially  clear. The mastoid air cells are unopacified. Other: None. IMPRESSION: No evidence of acute intracranial abnormality. Atrophy with small vessel ischemic changes. Electronically Signed   By: Julian Hy M.D.   On: 01/01/2021 03:43     Scheduled Meds:  amLODipine  5 mg Oral Daily   atorvastatin  40 mg Oral QHS   diltiazem  10 mg Intravenous Once   empagliflozin  25 mg Oral Daily   insulin aspart  0-15 Units Subcutaneous TID AC & HS   linagliptin  5 mg Oral Daily   And   metFORMIN  1,000 mg Oral BID WC   losartan  50 mg Oral Daily   magnesium oxide  400 mg Oral BID   [START ON 01/03/2021] meloxicam  7.5 mg Oral Daily   metoprolol tartrate  25 mg Oral BID   pantoprazole  40 mg Oral q morning   PARoxetine  20 mg Oral Daily   potassium chloride  40 mEq Oral Once   QUEtiapine  100 mg Oral QHS   Continuous Infusions:  azithromycin 500 mg (01/02/21 0320)   heparin 1,050 Units/hr (01/02/21 0656)   piperacillin-tazobactam (ZOSYN)   IV 3.375 g (01/02/21 0830)     LOS: 2 days   Time spent: 59  Nita Sells, MD [facility] To contact the attending provider between 7A-7P or the covering provider during after hours 7P-7A, please log into the web site [email or web address] and access using universal [place] password for that web site. If you do not have the password, please call the hospital operator.  01/02/2021, 1:31 PM

## 2021-01-02 NOTE — Consult Note (Addendum)
Cardiology Consultation:   Patient ID: KOLBI TOFTE MRN: [id number]; DOB: [birth year]  Admit date: 12/30/2020 Date of Consult: 01/02/2021  PCP:  [name], MD   [facility] Providers Cardiologist:  New to Dr. Johney Frame  Patient Profile:   Kathryn Wall is a 75 y.o. female with a hx of hypertension, hyperlipidemia, GERD, type 2 diabetes, CKD stage III, remote history of breast cancer S/P lumpectomy and radiation 1997, anxiety, who is being seen 01/02/2021 for the evaluation of new onset of A. fib RVR at the request of Dr. Hal Hope.  History of Present Illness:   [name] denied any previous known cardiac disease and does not see a cardiologist.  She has an Echo completed on 10/03/2020, revealed EF >75 %, LV hyperdynamic function, no RWMA, grade 1 DD, RV normal, trivial MR, mild aortic sclerosis.  She had a stress Myoview completed 01/22/2009 for chest tightness, she had no chest pain with negative EKG at the time, no complete report can be found.   She presented to the ER on 12/30/2020 complaining generalized weakness, near falls, difficulty ambulation, fever, chills, nausea, diarrhea, urinary frequency, DOE, over the past week. She was febrile, tachycardic, tachypneic at ER.  Diagnostic revealed leukocytosis with WBC 18300 with a left shift, CMP revealed hyponatremia 132, elevated creatinine 1.13, GFR 50.  Lactic acid WNL.  Flu and COVID-negative.  Urinalysis with pyuria. CXR showed no acute finding.  CTAP with contrast revealed colonic diverticulosis with equivocal faint fat stranding about the junction of distal descending and sigmoid colon, possible mild diverticulitis, no perforation or abscess.  RLL patchy dependent opacity suggests atelectasis, could represent pneumonia.  Blood culture x2 NTD.  EKG at admission 7/5 revealed sinus tachycardia with ventricular rate of 110, minimal ST depression of V4-V6 likely rate associated change.  He was subsequently admitted to hospital  medicine for diverticulitis and pneumonia, started on empiric antibiotic with Zosyn and azithromycin.  Hospital course is complicated by new onset of encephalopathy on 12/31/20 night, and new onset of A. fib RVR on 01/02/2019 tonight.  She was given Cardizem bolus and initiated on heparin infusion, echocardiogram is pending, cardiology is consulted for further management.  Patient states she was felt her heart was racing last night and felt a little dizzy. She states symptoms have resolved now. She denied any chest pain or pressure, SOB, leg edema, orthopnea. She is in the process of getting sleepy study for OSA. She is on [place] oxygen currently, not at baseline. She states her husband had A fib and she is familiar with it. She denied any hx of A fib and denied any hx of major bleeding in the past.    Past Medical History:  Diagnosis Date   Breast cancer ([place]) 1997   AGE 74, BRCA 1 NEGATIVE 2. 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    CKD (chronic kidney disease), stage III (HCC)    Diabetes mellitus    TYPE II   High cholesterol    Hypertension     Past Surgical History:  Procedure Laterality Date   [place]   RIGHT BREAST LUMPECTOMY   CHOLECYSTECTOMY  1980     Home Medications:  Prior to Admission medications   Medication Sig Start Date End Date Taking? Authorizing Provider  amLODipine (NORVASC) 5 MG tablet TAKE 1 TABLET BY MOUTH ONCE DAILY Patient taking differently: Take 5 mg by mouth daily. 08/14/20  Yes Pickard,  Cammie Mcgee, MD  amoxicillin-clavulanate (AUGMENTIN) 875-125 MG tablet Take 1 tablet by mouth every 12 (twelve) hours. 12/30/20  Yes Horton, Kristie M, DO  Ascorbic Acid (VITA-C PO) Take 1 tablet by mouth daily.   Yes [provider]  atorvastatin (LIPITOR) 40 MG tablet TAKE 1 TABLET BY MOUTH AT BEDTIME Patient taking differently: Take 40 mg by mouth daily. 10/22/20  Yes [name], MD  cholecalciferol  (VITAMIN D3) 25 MCG (1000 UNIT) tablet Take 1,000 Units by mouth daily.   Yes [provider]  diazepam (VALIUM) 5 MG tablet TAKE 1 TABLET BY MOUTH EVERY 12 HOURS ASNEEDED FOR ANXIETY (SLEEP). Patient taking differently: Take 5 mg by mouth every 12 (twelve) hours as needed for anxiety (sleep). TAKE 1 TABLET BY MOUTH EVERY 12 HOURS ASNEEDED FOR ANXIETY (SLEEP). 12/30/20  Yes [name], MD  JARDIANCE 25 MG TABS tablet TAKE 1 TABLET BY MOUTH ONCE A DAY Patient taking differently: Take 25 mg by mouth daily. 09/08/20  Yes [name], MD  losartan (COZAAR) 50 MG tablet TAKE 1 TABLET BY MOUTH ONCE A DAY Patient taking differently: Take 50 mg by mouth daily. 09/16/20  Yes [name], MD  meloxicam (MOBIC) 15 MG tablet Take 1 tablet (15 mg total) by mouth daily. 10/23/20  Yes [name], MD  nystatin ointment (MYCOSTATIN) APPLY TO AFFECTED AREAS TWICE DAILY Patient taking differently: Apply 1 application topically 2 (two) times daily. 11/10/20  Yes [name], MD  pantoprazole (PROTONIX) 40 MG tablet TAKE 1 TABLET BY MOUTH EVERY MORNING Patient taking differently: Take 40 mg by mouth daily. 06/25/20  Yes [name], MD  PARoxetine (PAXIL) 20 MG tablet Take 1 tablet (20 mg total) by mouth daily. 09/05/20  Yes [name], MD  pioglitazone (ACTOS) 30 MG tablet Take 1 tablet (30 mg total) by mouth daily. 08/20/20  Yes [name], MD  QUEtiapine (SEROQUEL) 50 MG tablet Take 100 mg by mouth at bedtime. 12/09/20  Yes [provider]  sitaGLIPtin-metformin (JANUMET) 50-1000 MG tablet TAKE 1 TABLET BY MOUTH TWICE A DAY WITH A MEAL Patient taking differently: Take 1 tablet by mouth 2 (two) times daily with a meal. TAKE 1 TABLET BY MOUTH TWICE A DAY WITH A MEAL 12/30/20  Yes [name], MD    Inpatient Medications: Scheduled Meds:  amLODipine  5 mg Oral Daily   atorvastatin  40 mg Oral QHS   diltiazem  10 mg Intravenous Once   empagliflozin   25 mg Oral Daily   insulin aspart  0-15 Units Subcutaneous TID AC & HS   linagliptin  5 mg Oral Daily   And   metFORMIN  1,000 mg Oral BID WC   losartan  50 mg Oral Daily   magnesium oxide  400 mg Oral BID   meloxicam  15 mg Oral Daily   metoprolol tartrate  25 mg Oral BID   pantoprazole  40 mg Oral q morning   PARoxetine  20 mg Oral Daily   potassium chloride  40 mEq Oral Once   QUEtiapine  100 mg Oral QHS   Continuous Infusions:  azithromycin 500 mg (01/02/21 0320)   heparin 1,050 Units/hr (01/02/21 0656)   piperacillin-tazobactam (ZOSYN)  IV 3.375 g (01/02/21 0830)   PRN Meds: acetaminophen **OR** acetaminophen, diazepam, ondansetron **OR** ondansetron (ZOFRAN) IV  Allergies:    Allergies  Allergen Reactions   Allergen [Antipyrine-Benzocaine] Other (See Comments)    Unknown Reaction  Ambien [Zolpidem Tartrate] Other (See Comments)    Can not tolerate, causes sleepwalking   Cymbalta [Duloxetine Hcl] Other (See Comments)    Unknown Reaction   Other     SENSITIVE TO ANTIBIOTICS   Ciprofloxacin Nausea Only    Nausea    Doxazosin Nausea And Vomiting   Percocet [Oxycodone-Acetaminophen] Nausea And Vomiting    Social History:   Social History   Socioeconomic History   Marital status: Married    Spouse name: Not on file   Number of children: Not on file   Years of education: Not on file   Highest education level: Not on file  Occupational History   Not on file  Tobacco Use   Smoking status: Never   Smokeless tobacco: Never  Substance and Sexual Activity   Alcohol use: No   Drug use: No   Sexual activity: Yes    Birth control/protection: Post-menopausal, Surgical    Comment: HYST  Other Topics Concern   Not on file  Social History Narrative   Not on file   Social Determinants of Health   Financial Resource Strain: Not on file  Food Insecurity: Not on file  Transportation Needs: Not on file  Physical Activity: Not on file  Stress: Not on file  Social  Connections: Not on file  Intimate Partner Violence: Not on file    Family History:    Family History  Problem Relation Age of Onset   Cancer Mother        COLON   Hypertension Father    Heart disease Father      ROS:  Constitutional: see HPI  Eyes: Denied vision change or loss Ears/Nose/Mouth/Throat: Denied ear ache, sore throat, coughing, sinus pain Cardiovascular:see HPI  Respiratory: see HPI  Gastrointestinal: Denied abdominal pain Genital/Urinary: Denied dysuria, hematuria, urinary frequency/urgency Musculoskeletal: Denied muscle ache, joint pain, weakness Skin: Denied rash, wound Neuro: see HPI  Psych: history of depression/anxiety  Endocrine: history of diabetes   Physical Exam/Data:   Vitals:   01/01/21 2000 01/02/21 0103 01/02/21 0400 01/02/21 0752  BP:  109/80 108/61 128/74  Pulse:  83 66 73  Resp:  17 16 20   Temp:  99 F (37.2 C) 98.3 F (36.8 C) 99.2 F (37.3 C)  TempSrc:  Oral Oral Oral  SpO2:  90% 90% 94%  Weight: 78.9 kg     Height: 5' 5"  (1.651 m)       Intake/Output Summary (Last 24 hours) at 01/02/2021 1104 Last data filed at 01/02/2021 0656 Gross per 24 hour  Intake 288.94 ml  Output 800 ml  Net -511.06 ml   Last 3 Weights 01/01/2021 12/16/2020 08/11/2020  Weight (lbs) 174 lb 174 lb 5 oz 175 lb  Weight (kg) 78.926 kg 79.068 kg 79.379 kg     Body mass index is 28.96 kg/m.   Vitals:  Vitals:   01/02/21 0400 01/02/21 0752  BP: 108/61 128/74  Pulse: 66 73  Resp: 16 20  Temp: 98.3 F (36.8 C) 99.2 F (37.3 C)  SpO2: 90% 94%   General Appearance: In no apparent distress, laying in bed HEENT: Normocephalic, atraumatic. EOMs intact.  Neck: Supple, trachea midline, no JVDs Cardiovascular: Regular rate and rhythm, normal S1-S2,  no murmur/rub/gallop Respiratory: Resting breathing unlabored, lungs sounds clear to auscultation bilaterally, no use of accessory muscles. On 2LNC, pox 88-94%, appears dropping with sleeping  Gastrointestinal: Bowel  sounds positive, abdomen soft, non-tender, non-distended.  Extremities: Able to move all extremities in bed without difficulty,  no edema/cyanosis/clubbing Genitourinary:  genital exam not performed Musculoskeletal: Normal muscle bulk and tone,  no limited range of motion Skin: Intact, warm, dry. No rashes or petechiae noted in exposed areas.  Neurologic: Alert, oriented to person, place and time. Fluent speech,  no cognitive deficit,  no gross focal neuro deficit Psychiatric: Normal affect. Mood is appropriate.    EKG:  The EKG was personally reviewed and demonstrates:    EKG from 12/30/2020 at 18:10 reviewed sinus tachycardia, 110 bpm, TWI on V1, minimal ST depression of V4-V6, suspect rate related change.   EKG from 01/01/2021 at 19:15 with A. fib RVR, 153 bpm, PVC, diffuse ST depression of inferior and precordial leads, suspect rate related change.  Telemetry:  Telemetry was personally reviewed and demonstrates:  Sinus rhythm with bradycardia 55-60s with napping, new onset of A fib RVR 160s on 12/31/20 at 1905, converted to [place] at 2033 on 12/31/20   Relevant CV Studies:  Echo on 10/03/20:   1. Left ventricular ejection fraction, by estimation, is >75%. The left  ventricle has hyperdynamic function. The left ventricle has no regional  wall motion abnormalities. Left ventricular diastolic parameters are  consistent with Grade I diastolic  dysfunction (impaired relaxation). The average left ventricular global  longitudinal strain is -21.6 %. The global longitudinal strain is normal.   2. Right ventricular systolic function is normal. The right ventricular  size is normal. Tricuspid regurgitation signal is inadequate for assessing  PA pressure.   3. The mitral valve is normal in structure. Trivial mitral valve  regurgitation. No evidence of mitral stenosis.   4. The aortic valve is tricuspid. Aortic valve regurgitation is not  visualized. Mild aortic valve sclerosis is present, with no evidence  of  aortic valve stenosis.   Laboratory Data:  High Sensitivity Troponin:   Recent Labs  Lab 01/01/21 1939 01/01/21 2131  TROPONINIHS 21* 24*     Chemistry Recent Labs  Lab 12/31/20 1005 01/01/21 0846 01/02/21 0703  NA 133* 136 135  K 3.3* 3.4* 3.4*  CL 103 103 105  CO2 20* 24 26  GLUCOSE 154* 94 90  BUN 14 15 12   CREATININE 1.03* 1.10* 1.05*  CALCIUM 8.2* 8.4* 8.2*  GFRNONAA 56* 52* 55*  ANIONGAP 10 9 4*    Recent Labs  Lab 12/31/20 1005 01/01/21 0846 01/02/21 0703  PROT 6.0* 6.4* 5.7*  ALBUMIN 2.8* 2.8* 2.4*  AST 56* 64* 78*  ALT 25 37 47*  ALKPHOS 63 69 88  BILITOT 0.8 0.9 0.4   Hematology Recent Labs  Lab 12/31/20 0806 01/01/21 0846 01/02/21 0703  WBC 15.1* 11.4* 8.6  RBC 3.95 3.71* 3.47*  HGB 11.7* 10.9* 10.2*  HCT 38.3 33.9* 31.6*  MCV 97.0 91.4 91.1  MCH 29.6 29.4 29.4  MCHC 30.5 32.2 32.3  RDW 15.3 15.0 15.0  PLT 197 226 213   BNPNo results for input(s): BNP, PROBNP in the last 168 hours.  DDimer No results for input(s): DDIMER in the last 168 hours.   Radiology/Studies:  DG Chest 1 View  Result Date: 12/30/2020 CLINICAL DATA:  Generalized weakness.  Fall 2 hours ago EXAM: CHEST  1 VIEW COMPARISON:  02/21/2020 FINDINGS: The heart size and mediastinal contours are within normal limits. Both lungs are clear. The visualized skeletal structures are unremarkable. IMPRESSION: No active disease. Electronically Signed   By: Miachel Roux M.D.   On: 12/30/2020 13:26   DG Chest 2 View  Result Date: 01/01/2021 CLINICAL DATA:  Pneumonia.  Shortness  of breath. EXAM: CHEST - 2 VIEW COMPARISON:  12/30/2020.  09/05/2020. FINDINGS: Mediastinum and hilar structures normal. Heart size normal. Prominent density, most likely infiltrate, noted along the posterior lungs on lateral view. This is difficult to identify on PA view it but is most likely on the right. Close follow-up chest x-rays recommended to demonstrate resolution. Costophrenic angles are clear, no  pleural effusion noted. No pneumothorax. IMPRESSION: Prominent density, most likely infiltrate, noted along the posterior lungs on lateral view. This is difficult to identify on the PA view but is most likely on the right. Close follow-up chest x-rays recommended demonstrate clearing. Electronically Signed   By: Marcello Moores  Register   On: 01/01/2021 09:07   CT HEAD WO CONTRAST  Result Date: 01/01/2021 CLINICAL DATA:  Altered mental status EXAM: CT HEAD WITHOUT CONTRAST TECHNIQUE: Contiguous axial images were obtained from the base of the skull through the vertex without intravenous contrast. COMPARISON:  02/22/2020 FINDINGS: Brain: No evidence of acute infarction, hemorrhage, hydrocephalus, extra-axial collection or mass lesion/mass effect. Global cortical and central atrophy. Subcortical white matter and periventricular small vessel ischemic changes. Vascular: No hyperdense vessel or unexpected calcification. Skull: Normal. Negative for fracture or focal lesion. Sinuses/Orbits: The visualized paranasal sinuses are essentially clear. The mastoid air cells are unopacified. Other: None. IMPRESSION: No evidence of acute intracranial abnormality. Atrophy with small vessel ischemic changes. Electronically Signed   By: Julian Hy M.D.   On: 01/01/2021 03:43   CT Abdomen Pelvis W Contrast  Result Date: 12/30/2020 CLINICAL DATA:  Acute abdominal pain. Two falls in the last 24 hours, slipped in the bathroom. Diarrhea. EXAM: CT ABDOMEN AND PELVIS WITH CONTRAST TECHNIQUE: Multidetector CT imaging of the abdomen and pelvis was performed using the standard protocol following bolus administration of intravenous contrast. CONTRAST:  65m OMNIPAQUE IOHEXOL 300 MG/ML  SOLN COMPARISON:  Remote abdominopelvic CT 05/27/2011 in 01/07/2006. Chest radiograph earlier this day FINDINGS: Lower chest: Patchy dependent opacity in the right lower lobe, greater than typically seen with atelectasis. No pleural fluid. Hepatobiliary:  Diffuse hepatic steatosis. Area of low-density in the subcapsular right lobe measuring 2.9 cm likely represents a hemangioma, and is slightly smaller than on 2007 exam. No additional focal hepatic lesion. Clips in the gallbladder fossa postcholecystectomy. No biliary dilatation. Pancreas: No ductal dilatation or inflammation. Spleen: Normal in size without focal abnormality. Splenule at the hilum. Adrenals/Urinary Tract: Normal adrenal glands. Lobulated bilateral renal contours with cortical thinning and areas of scarring. Small low-density cortical lesions are likely cysts but too small to accurately characterize. There is symmetric excretion on delayed phase imaging as well as homogeneous renal enhancement partially distended urinary bladder without wall thickening. Stomach/Bowel: Decompressed stomach. Unremarkable duodenum. Occasional loops of fluid-filled nondilated bowel in the pelvis without wall thickening or inflammation. Appendectomy. Multifocal colonic diverticula the equivocal faint fat stranding about junction of distal descending and sigmoid colon, series 6, image 72, possible mild diverticulitis. No colonic wall thickening. Vascular/Lymphatic: Moderate aortic atherosclerosis. Patent portal vein. No acute vascular findings. No enlarged lymph nodes in the abdomen or pelvis Reproductive: Hysterectomy.  No adnexal mass.  Quiescent ovaries. Other: No ascites or free air no abdominal wall hernia Musculoskeletal: Scoliosis and diffuse degenerative change throughout the spine. There is a prominent Schmorl's node in T9 superior endplate. No acute osseous abnormalities. No fracture of included ribs, spine, or pelvis. IMPRESSION: 1. Colonic diverticulosis with equivocal faint fat stranding about the junction of distal descending and sigmoid colon, possible mild diverticulitis. No perforation or abscess. 2. Patchy  dependent opacity in the right lower lobe. This is greater than typically seen with atelectasis, and  could represent pneumonia in the appropriate clinical setting. 3. Bilateral renal cortical scarring. Aortic Atherosclerosis (ICD10-I70.0). Electronically Signed   By: Keith Rake M.D.   On: 12/30/2020 22:36     Assessment and Plan:   New onset of paroxysmal A. fib with RVR -Presented with GI symptoms, sinus tach at admission, new onset A. fib RVR occurred on 01/01/2021 1905 and conversion to SR at 2033 -TSH WNL -K deficient at 3.4, will replete with 40 MEQ today,  check magnesium level  -Echocardiogram is pending, will follow  -Likely triggered by acute infection +/- OSA -Agree with initiation of metoprolol 25 twice daily, if further bradycardia occurs may consider lower to 25XL daily  -CHA2DS2-VASc 5 due to HTN, DM, age, gender; agree with anticoagulation, continue heparin gtt, transition to PO Eliquis 57m BID at DC, consider STOP meloxicam  - need outpatient workup for OSA   Elevated trop - no chest pain  - Hs Trop 21 >24 - EKG changes likely rate associated, non-specific  - suspect demand ischemia  - Echo pending  - no further workup at this time   HTN - BP stable on amlodipine, metoprolol, and losartan   HLD - continue lipitor 454m  Diverticulitis Pneumonia  Type 2 DM CKD III Anemia GERD Anxiety - managed per IM      Risk Assessment/Risk Scores:          CHA2DS2-VASc Score = 5  This indicates a 7.2% annual risk of stroke. The patient's score is based upon: CHF History: No HTN History: Yes Diabetes History: Yes Stroke History: No Vascular Disease History: No Age Score: 2 Gender Score: 1       For questions or updates, please contact [facility] consult [email or web address] for contact info under    Signed, XiMargie BilletNP  01/02/2021 11:04 AM  Patient seen and examined and agree with XiMargie BilletNP as detailed above.  In brief, the patient is a [age].o. female with a hx of hypertension, hyperlipidemia, GERD, type 2 diabetes, CKD stage III,  remote history of breast cancer S/P lumpectomy and radiation 1997, anxiety who presented with weakness, nausea, and fever found to have suspected diverticulitis. Course complicated by new Afib with RVR for which Cardiology has been consulted.   Had episode of new Afib with RVR on 01/01/21 with HR 130-150s. Was given dilt 1082mV, started on PO metop and heparin for AC [facility] converted back to NSR this AM. TTE 10/03/2020, revealed EF >75 %, LV hyperdynamic function, no RWMA, grade 1 DD, RV normal, trivial MR, mild aortic sclerosis. Appears unchanged on my prelim review of TTE today.  GEN: No acute distress.   Neck: No JVD Cardiac: RRR, no murmurs, rubs, or gallops.  Respiratory: Clear to auscultation bilaterally. GI: Soft, nontender, non-distended  MS: No edema; No deformity. Neuro:  Nonfocal  Psych: Normal affect    Plan: -Continue metop 68m64mD--transition to long acting prior to discharge -Change from heparin to apixaban 5mg 19m once able; will need to be off NSAIDs once started on apixaban -Patient is at risk of falling so will need ongoing discussions about risk/benefits of AC in the future; she is working with PT to improve strength and ambulating with a walker as well -TTE with normal LVEF, normal LA/RA size, G1DD; will follow-up repeat, however, appears unchanged based on my review -TSH normal -Management of diverticulitis per IM -  Will likely need sleep study as out-patient  We will arrange CV follow-up. Cardiology will sign-off at this time.  Gwyndolyn Kaufman, MD

## 2021-01-02 NOTE — Progress Notes (Signed)
*  PRELIMINARY RESULTS* Echocardiogram 2D Echocardiogram has been performed.  Luisa Hart RDCS 01/02/2021, 10:18 AM

## 2021-01-02 NOTE — Progress Notes (Signed)
ANTICOAGULATION CONSULT NOTE - Follow Up Consult  Pharmacy Consult for IV heparin  Indication: atrial fibrillation  Allergies  Allergen Reactions   Allergen [Antipyrine-Benzocaine] Other (See Comments)    Unknown Reaction   Ambien [Zolpidem Tartrate] Other (See Comments)    Can not tolerate, causes sleepwalking   Cymbalta [Duloxetine Hcl] Other (See Comments)    Unknown Reaction   Other     SENSITIVE TO ANTIBIOTICS   Ciprofloxacin Nausea Only    Nausea    Doxazosin Nausea And Vomiting   Percocet [Oxycodone-Acetaminophen] Nausea And Vomiting    Patient Measurements: Height: 5\' 5"  (165.1 cm) Weight: 78.9 kg (174 lb) IBW/kg (Calculated) : 57 Heparin Dosing Weight: 76.3 kg  Vital Signs: Temp: 99.2 F (37.3 C) (07/08 0752) Temp Source: Oral (07/08 0752) BP: 128/74 (07/08 0752) Pulse Rate: 73 (07/08 0752)  Labs: Recent Labs    12/31/20 0806 12/31/20 1005 01/01/21 0846 01/01/21 1939 01/01/21 2131 01/02/21 0703  HGB 11.7*  --  10.9*  --   --  10.2*  HCT 38.3  --  33.9*  --   --  31.6*  PLT 197  --  226  --   --  213  LABPROT  --  13.3  --   --   --   --   INR  --  1.0  --   --   --   --   HEPARINUNFRC  --   --   --   --   --  0.67  CREATININE  --  1.03* 1.10*  --   --  1.05*  TROPONINIHS  --   --   --  21* 24*  --     Estimated Creatinine Clearance: 47.3 mL/min (A) (by C-G formula based on SCr of 1.05 mg/dL (H)).   Assessment: Anticoag: Heparin for Afib with RVR  7/8 0600 HL: 0.67  Therapeutic X 1 Hgb 10.2, Plt stable  Goal of Therapy:  Heparin level 0.3-0.7 units/ml Monitor platelets by anticoagulation protocol: Yes   Plan:  Continue Heparin 1050 units/hr (10.5 ml/hr) Obtain HL 7/8 @ 1300  Will continue to monitor for s/s of bleeding and f/u on HL  Daily HL and [place], PharmD PGY1 Acute Care Resident   01/02/2021,10:06 AM

## 2021-01-02 NOTE — Progress Notes (Signed)
   01/02/21 1124  Clinical Encounter Type  Visited With Patient  Visit Type Initial  Referral From Nurse  Consult/Referral To Chaplain  Spiritual Encounters  Spiritual Needs Literature  Stress Factors  Patient Stress Factors Major life changes  Chaplain resounded to the consult for Kathryn Wall.   We talked about the AD. Her daughter will be in this afternoon, and I will go over the paperwork with them both.   Chaplain Devora Tortorella Morgan-Simpson [phone]

## 2021-01-02 NOTE — Progress Notes (Signed)
   01/02/21 1409  Clinical Encounter Type  Visited With Patient and family together  Visit Type Follow-up  Referral From Nurse  Consult/Referral To Chaplain  Spiritual Encounters  Spiritual Needs Literature  Stress Factors  Patient Stress Factors Major life changes  Followed-up with Ms. [place] and her daughter Juliann Pulse. We went over the AD paperwork and informed when complete I can review and then make an appointment to have notarized. We also discussed grief counseling for Mrs. [place] and some suggestions on counselors in [place].    Chaplain Dakari Cregger Morgan-Simpson  [phone]

## 2021-01-02 NOTE — Progress Notes (Signed)
PT Cancellation Note  Patient Details Name: BITA CARTWRIGHT MRN: [id number] DOB: 1946/02/18   Cancelled Treatment:    Reason Eval/Treat Not Completed: Fatigue/lethargy limiting ability to participate;Pain limiting ability to participate. Pt went into a fib with RVR last night. Now on heparin infusion. Pt very fatigued this AM with c/o abdominal pain. When this Probation officer introduced herself as the physical therapist, pt replied "oh honey, there is just no way I can do that right now." PT to re-attempt as time allows.   Lorriane Shire 01/02/2021, 8:23 AM  Lorrin Goodell, PT  Office # [phone] Pager [phone]

## 2021-01-03 ENCOUNTER — Inpatient Hospital Stay (HOSPITAL_COMMUNITY): Payer: Medicare Other

## 2021-01-03 LAB — CBC
HCT: 30.3 % — ABNORMAL LOW (ref 36.0–46.0)
Hemoglobin: 9.8 g/dL — ABNORMAL LOW (ref 12.0–15.0)
MCH: 30.1 pg (ref 26.0–34.0)
MCHC: 32.3 g/dL (ref 30.0–36.0)
MCV: 92.9 fL (ref 80.0–100.0)
Platelets: 209 10*3/uL (ref 150–400)
RBC: 3.26 MIL/uL — ABNORMAL LOW (ref 3.87–5.11)
RDW: 15.2 % (ref 11.5–15.5)
WBC: 7.6 10*3/uL (ref 4.0–10.5)
nRBC: 0 % (ref 0.0–0.2)

## 2021-01-03 LAB — GLUCOSE, CAPILLARY
Glucose-Capillary: 91 mg/dL (ref 70–99)
Glucose-Capillary: 94 mg/dL (ref 70–99)
Glucose-Capillary: 102 mg/dL — ABNORMAL HIGH (ref 70–99)
Glucose-Capillary: 144 mg/dL — ABNORMAL HIGH (ref 70–99)

## 2021-01-03 LAB — HEPARIN LEVEL (UNFRACTIONATED): Heparin Unfractionated: 0.68 IU/mL (ref 0.30–0.70)

## 2021-01-03 MED ORDER — DIAZEPAM 2 MG PO TABS: 1.0000 mg | ORAL_TABLET | Freq: Every evening | ORAL | Status: DC | PRN

## 2021-01-03 MED ORDER — AMOXICILLIN-POT CLAVULANATE 875-125 MG PO TABS
1.0000 | ORAL_TABLET | Freq: Two times a day (BID) | ORAL | Status: DC
  Administered 2021-01-03 – 2021-01-05 (×5): 1 via ORAL
  Filled 2021-01-03 (×5): qty 1

## 2021-01-03 MED ORDER — GADOBUTROL 1 MMOL/ML IV SOLN
8.0000 mL | Freq: Once | INTRAVENOUS | Status: AC | PRN
  Administered 2021-01-03: 8 mL via INTRAVENOUS

## 2021-01-03 MED ORDER — APIXABAN 5 MG PO TABS
5.0000 mg | ORAL_TABLET | Freq: Two times a day (BID) | ORAL | Status: DC
  Administered 2021-01-03 – 2021-01-05 (×5): 5 mg via ORAL
  Filled 2021-01-03 (×5): qty 1

## 2021-01-03 MED ORDER — METOPROLOL SUCCINATE ER 25 MG PO TB24
25.0000 mg | ORAL_TABLET | Freq: Every day | ORAL | Status: DC
  Administered 2021-01-03 – 2021-01-05 (×3): 25 mg via ORAL
  Filled 2021-01-03 (×3): qty 1

## 2021-01-03 NOTE — Progress Notes (Signed)
PROGRESS NOTE   Kathryn Wall  UDJ:[id number] DOB: 10/24/1945 DOA: 12/30/2020 PCP: [name], MD  Brief Narrative:  75 year old white female known history HTN, DM TY 2 Breast cancer status post lumpectomy 1997 (XRT )reflux CKD 3 AA Mild cognitive issues followed by Dr. Logan Bores neurological Poor sleep schedule for sleep study currently on Valium for the same-cannot take Ambien because of sleepwalking Recent loss of husband Patient currently getting outpatient therapy at [facility], ED 7/5 weakness X 2 days + mechanical fall in addition to abdominal pain-she initially denied this to this examiner does not tell me she has chills or fevers Has not had cough T-max 100.3 WBC 18.3 abdominal tenderness left lower quadrant noted and also had on CT scan diverticulosis in addition to possible right lower lobe pneumonia  She developed A. fib with RVR paroxysmal and cardiology was consulted-she converted rapidly on 7/7 to sinus Echo is pending-currently maintaining sinus on metoprolol Overall is improving  Hospital-Problem based course  P. Afib-occurred in setting of infection-unclear other precipitants--converted with push of Cardizem alone and remains in sinus rhythm on monitors EF 60-65%, mild LVH Changing from heparin to Eliquis 5 mg twice daily today and transition from Toprol twice daily to XL today per cardiology instructions-they have signed off 7/8 HTN Losartan 50, amlodipine 5 mg ?  Diverticulitis Zosyn/azithromycin transitioned to Augmentin 7/9-complete total 7 days ending 01/05/2021 ?  Right-sided pneumonia?  Aspiration Absolutely no cough or cold-2 view CXR 7/7 indeterminant to my overview and looks unchanged Currently on Augmentin DM TY 2 PCP was contemplating holding Actos 30--held  resume Janumet 1 tab twice daily in addition to Jardiance 25 daily, she can continue sliding scale-CBGs ranging 91-1 25 CKD 3 AA Hypokalemia replace magnesium  with p.o. Mag-Ox 400 twice daily Repeat labs a.m. Encephalopathy 01/01/21 Patient has an underlying issue with cognitive deficits-she has not had any stroke-like symptoms and is moving all 4 limbs equally MR ordered 7/8 shows no acute findings and mild microvascular disease Cognitive deficits, recent falls--Work-up performed by Dr. Nancy Nordmann  -B12 and folate as well as ANA with reflex however abnormal--is being referred to rheumatology and is supposed to get B12 shots Will need outpatient initiation of positive pressure ventilation at night to be arranged by her neurologist Longstanding anxiety Cut back Valium to 1 mg at night-can continue Paxil 20, Seroquel 100 at night Has started to down titrate meds Daughter informs me on 7/8 that she has had falls which she believes is overmedication with the BZD's She is very sleepy during the morning time and we will try to orient her later in the day   DVT prophylaxis: loveneox Code Status: full  Family Communication: Long discussion with the daughter Juliann Pulse at the bedside yesterday Disposition:  Status is: Inpatient  Remains inpatient appropriate because:Inpatient level of care appropriate due to severity of illness  Dispo: The patient is from: Home              Anticipated d/c is to: Home              Patient currently is not medically stable to d/c.   Difficult to place patient No   Consultants:  None yet  Procedures:   Antimicrobials:  Azithro and Zosyn from 7/5-->7/9-->Augmentin   Subjective:  Remains sleepy this morning Does not want to be disturbed Nursing endorses was quite unsteady on her feet at night and her knees buckled-additionally she slept probably at 4 AM this morning  She is wearing oxygen this morning  Objective: Vitals:   01/02/21 1914 01/02/21 2333 01/03/21 0323 01/03/21 0727  BP: (!) 104/91 119/72 114/67 (!) 132/96  Pulse: 64 62 66 71  Resp: 19 20 20 12   Temp: 97.7 F (36.5 C) 98 F (36.7 C) 98.3 F (36.8 C)  98.6 F (37 C)  TempSrc: Oral Oral Oral Oral  SpO2: 91% 98% 98% 98%  Weight:      Height:        Intake/Output Summary (Last 24 hours) at 01/03/2021 0912 Last data filed at 01/03/2021 2355 Gross per 24 hour  Intake 342.79 ml  Output --  Net 342.79 ml    Filed Weights   01/01/21 2000  Weight: 78.9 kg    Examination:  Sleepy coherent awakens S1-S2 no murmur sinus rhythm on monitors Chest clear no rales no rhonchi Abdomen soft no rebound no guarding--less tender than previously ROM intact no lower extremity edema Neurologically intact no focal deficit although quite sleepy  Data Reviewed: personally reviewed   CBC    Component Value Date/Time   WBC 7.6 01/03/2021 0601   RBC 3.26 (L) 01/03/2021 0601   HGB 9.8 (L) 01/03/2021 0601   HGB 11.8 12/16/2020 1524   HCT 30.3 (L) 01/03/2021 0601   HCT 36.5 12/16/2020 1524   PLT 209 01/03/2021 0601   PLT 335 12/16/2020 1524   MCV 92.9 01/03/2021 0601   MCV 91 12/16/2020 1524   MCH 30.1 01/03/2021 0601   MCHC 32.3 01/03/2021 0601   RDW 15.2 01/03/2021 0601   RDW 13.7 12/16/2020 1524   LYMPHSABS 1.9 01/02/2021 0703   LYMPHSABS 2.2 12/16/2020 1524   MONOABS 0.9 01/02/2021 0703   EOSABS 0.4 01/02/2021 0703   EOSABS 0.3 12/16/2020 1524   BASOSABS 0.1 01/02/2021 0703   BASOSABS 0.1 12/16/2020 1524   CMP Latest Ref Rng & Units 01/02/2021 01/01/2021 12/31/2020  Glucose 70 - 99 mg/dL 90 94 154(H)  BUN 8 - 23 mg/dL 12 15 14   Creatinine 0.44 - 1.00 mg/dL 1.05(H) 1.10(H) 1.03(H)  Sodium 135 - 145 mmol/L 135 136 133(L)  Potassium 3.5 - 5.1 mmol/L 3.4(L) 3.4(L) 3.3(L)  Chloride 98 - 111 mmol/L 105 103 103  CO2 22 - 32 mmol/L 26 24 20(L)  Calcium 8.9 - 10.3 mg/dL 8.2(L) 8.4(L) 8.2(L)  Total Protein 6.5 - 8.1 g/dL 5.7(L) 6.4(L) 6.0(L)  Total Bilirubin 0.3 - 1.2 mg/dL 0.4 0.9 0.8  Alkaline Phos 38 - 126 U/L 88 69 63  AST 15 - 41 U/L 78(H) 64(H) 56(H)  ALT 0 - 44 U/L 47(H) 37 25     Radiology Studies: MR BRAIN W WO CONTRAST  Result  Date: 01/03/2021 CLINICAL DATA:  Initial evaluation for delirium. EXAM: MRI HEAD WITHOUT AND WITH CONTRAST TECHNIQUE: Multiplanar, multiecho pulse sequences of the brain and surrounding structures were obtained without and with intravenous contrast. CONTRAST:  96mL GADAVIST GADOBUTROL 1 MMOL/ML IV SOLN COMPARISON:  Prior CT from 01/01/2021. FINDINGS: Brain: Cerebral volume within normal limits. Scattered patchy T2/FLAIR hyperintensity within the periventricular and deep white matter both cerebral hemispheres most consistent with chronic small vessel ischemic disease, mild in nature. Small remote lacunar infarct present at the left frontal centrum semi ovale. Mild patchy involvement of the pons noted. No abnormal foci of restricted diffusion to suggest acute or subacute ischemia. Gray-white matter differentiation maintained. No encephalomalacia to suggest chronic cortical infarction. No evidence for acute or chronic intracranial hemorrhage. No mass lesion, midline shift or mass effect. No hydrocephalus  or extra-axial fluid collection. Pituitary gland suprasellar region within normal limits. Midline structures intact. No abnormal enhancement. Vascular: Major intracranial vascular flow voids are maintained. Skull and upper cervical spine: Craniocervical junction within normal limits. Bone marrow signal intensity normal. No scalp soft tissue abnormality. Sinuses/Orbits: Globes and orbital soft tissues within normal limits. Mild scattered mucosal thickening noted within the ethmoidal air cells. Paranasal sinuses are otherwise clear. Small left with trace right mastoid effusions noted. Visualized nasopharynx unremarkable. Inner ear structures grossly within normal limits. Other: None. IMPRESSION: 1. No acute intracranial abnormality. 2. Mild chronic microvascular ischemic disease for age. Electronically Signed   By: Jeannine Boga M.D.   On: 01/03/2021 04:12   ECHOCARDIOGRAM COMPLETE  Result Date: 01/02/2021     ECHOCARDIOGRAM REPORT   Patient Name:   Kathryn Wall [place] Date of Exam: 01/02/2021 Medical Rec #:  [id number]        Height:       65.0 in Accession #:    [id number]       Weight:       174.0 lb Date of Birth:  [birth year]        BSA:          1.864 m Patient Age:    41 years         BP:           108/61 mmHg Patient Gender: F                HR:           56 bpm. Exam Location:  Inpatient Procedure: 2D Echo, Cardiac Doppler and Color Doppler Indications:    Atrial fibrillation  History:        Patient has prior history of Echocardiogram examinations, most                 recent 10/03/2020. Risk Factors:Diabetes, Hypertension and                 Dyslipidemia.  Sonographer:    Luisa Hart RDCS Referring Phys: Joseph  1. Left ventricular ejection fraction, by estimation, is 60 to 65%. The left ventricle has normal function. The left ventricle has no regional wall motion abnormalities. There is mild left ventricular hypertrophy. Left ventricular diastolic parameters were normal.  2. Right ventricular systolic function is normal. The right ventricular size is normal. There is normal pulmonary artery systolic pressure. The estimated right ventricular systolic pressure is 43.3 mmHg.  3. The mitral valve is normal in structure. Trivial mitral valve regurgitation. No evidence of mitral stenosis.  4. The aortic valve is tricuspid. Aortic valve regurgitation is not visualized. Mild to moderate aortic valve sclerosis/calcification is present, without any evidence of aortic stenosis.  5. The inferior vena cava is dilated in size with >50% respiratory variability, suggesting right atrial pressure of 8 mmHg. FINDINGS  Left Ventricle: Left ventricular ejection fraction, by estimation, is 60 to 65%. The left ventricle has normal function. The left ventricle has no regional wall motion abnormalities. The left ventricular internal cavity size was normal in size. There is  mild left ventricular hypertrophy. Left  ventricular diastolic parameters were normal. Right Ventricle: The right ventricular size is normal. No increase in right ventricular wall thickness. Right ventricular systolic function is normal. There is normal pulmonary artery systolic pressure. The tricuspid regurgitant velocity is 2.16 m/s, and  with an assumed right atrial pressure of 8 mmHg, the estimated right ventricular systolic pressure is 26.7  mmHg. Left Atrium: Left atrial size was normal in size. Right Atrium: Right atrial size was normal in size. Pericardium: Trivial pericardial effusion is present. Presence of pericardial fat pad. Mitral Valve: The mitral valve is normal in structure. Trivial mitral valve regurgitation. No evidence of mitral valve stenosis. MV peak gradient, 3.4 mmHg. The mean mitral valve gradient is 1.0 mmHg. Tricuspid Valve: The tricuspid valve is normal in structure. Tricuspid valve regurgitation is mild. Aortic Valve: The aortic valve is tricuspid. Aortic valve regurgitation is not visualized. Mild to moderate aortic valve sclerosis/calcification is present, without any evidence of aortic stenosis. Aortic valve mean gradient measures 6.5 mmHg. Aortic valve peak gradient measures 10.8 mmHg. Aortic valve area, by VTI measures 0.88 cm. Pulmonic Valve: The pulmonic valve was not well visualized. Pulmonic valve regurgitation is not visualized. Aorta: The aortic root and ascending aorta are structurally normal, with no evidence of dilitation. Venous: The inferior vena cava is dilated in size with greater than 50% respiratory variability, suggesting right atrial pressure of 8 mmHg. IAS/Shunts: The interatrial septum was not well visualized.  LEFT VENTRICLE PLAX 2D LVIDd:         4.70 cm     Diastology LVIDs:         2.60 cm     LV e' medial:    7.29 cm/s LV PW:         1.00 cm     LV E/e' medial:  11.9 LV IVS:        0.70 cm     LV e' lateral:   8.16 cm/s LVOT diam:     1.35 cm     LV E/e' lateral: 10.6 LV SV:         33 LV SV Index:    18 LVOT Area:     1.43 cm  LV Volumes (MOD) LV vol d, MOD A2C: 53.0 ml LV vol d, MOD A4C: 61.1 ml LV vol s, MOD A2C: 19.6 ml LV vol s, MOD A4C: 20.1 ml LV SV MOD A2C:     33.4 ml LV SV MOD A4C:     61.1 ml LV SV MOD BP:      37.2 ml RIGHT VENTRICLE RV Basal diam:  3.20 cm    PULMONARY VEINS RV Mid diam:    1.70 cm    A Reversal Duration: 85.00 msec RV S prime:     9.14 cm/s  A Reversal Velocity: 19.60 cm/s TAPSE (M-mode): 2.4 cm     Diastolic Velocity:  16.10 cm/s                            S/D Velocity:        1.00                            Systolic Velocity:   96.04 cm/s LEFT ATRIUM             Index       RIGHT ATRIUM           Index LA diam:        2.60 cm 1.39 cm/m  RA Area:     11.60 cm LA Vol (A2C):   27.0 ml 14.48 ml/m RA Volume:   25.70 ml  13.78 ml/m LA Vol (A4C):   40.8 ml 21.88 ml/m LA Biplane Vol: 33.5 ml 17.97 ml/m  AORTIC VALVE  PULMONIC VALVE AV Area (Vmax):    0.98 cm     PV Vmax:       0.67 m/s AV Area (Vmean):   0.91 cm     PV Vmean:      50.200 cm/s AV Area (VTI):     0.88 cm     PV VTI:        0.189 m AV Vmax:           164.00 cm/s  PV Peak grad:  1.8 mmHg AV Vmean:          121.500 cm/s PV Mean grad:  1.0 mmHg AV VTI:            0.374 m AV Peak Grad:      10.8 mmHg AV Mean Grad:      6.5 mmHg LVOT Vmax:         112.00 cm/s LVOT Vmean:        77.200 cm/s LVOT VTI:          0.230 m LVOT/AV VTI ratio: 0.61  AORTA Ao Root diam: 2.90 cm Ao Asc diam:  3.05 cm MITRAL VALVE                 TRICUSPID VALVE MV Area (PHT): 2.95 cm      TR Peak grad:   18.7 mmHg MV Area VTI:   0.88 cm      TR Vmax:        216.00 cm/s MV Peak grad:  3.4 mmHg MV Mean grad:  1.0 mmHg      SHUNTS MV Vmax:       0.93 m/s      Systemic VTI:  0.23 m MV Vmean:      52.9 cm/s     Systemic Diam: 1.35 cm MV Decel Time: 257 msec MR Peak grad:    67.9 mmHg MR Mean grad:    51.0 mmHg MR Vmax:         412.00 cm/s MR Vmean:        347.0 cm/s MR PISA:         1.57 cm MR PISA Eff ROA: 9 mm MR PISA Radius:   0.50 cm MV E velocity: 86.70 cm/s MV A velocity: 74.10 cm/s MV E/A ratio:  1.17 Oswaldo Milian MD Electronically signed by Oswaldo Milian MD Signature Date/Time: 01/02/2021/1:36:01 PM    Final      Scheduled Meds:  amLODipine  5 mg Oral Daily   atorvastatin  40 mg Oral QHS   diltiazem  10 mg Intravenous Once   empagliflozin  25 mg Oral Daily   insulin aspart  0-15 Units Subcutaneous TID AC & HS   linagliptin  5 mg Oral Daily   And   metFORMIN  1,000 mg Oral BID WC   losartan  50 mg Oral Daily   magnesium oxide  400 mg Oral BID   meloxicam  7.5 mg Oral Daily   metoprolol tartrate  25 mg Oral BID   pantoprazole  40 mg Oral q morning   PARoxetine  20 mg Oral Daily   QUEtiapine  100 mg Oral QHS   Continuous Infusions:  azithromycin 500 mg (01/03/21 0300)   heparin 1,050 Units/hr (01/03/21 0512)   piperacillin-tazobactam (ZOSYN)  IV 3.375 g (01/03/21 0423)     LOS: 3 days   Time spent: 25  Nita Sells, MD [facility] To contact the attending provider between 7A-7P or the covering provider during after hours  7P-7A, please log into the web site [email or web address] and access using universal [place] password for that web site. If you do not have the password, please call the hospital operator.  01/03/2021, 9:12 AM

## 2021-01-03 NOTE — Progress Notes (Signed)
ANTICOAGULATION CONSULT NOTE - Follow Up Consult  Pharmacy Consult for Heparin Indication: atrial fibrillation  Allergies  Allergen Reactions   Allergen [Antipyrine-Benzocaine] Other (See Comments)    Unknown Reaction   Ambien [Zolpidem Tartrate] Other (See Comments)    Can not tolerate, causes sleepwalking   Cymbalta [Duloxetine Hcl] Other (See Comments)    Unknown Reaction   Other     SENSITIVE TO ANTIBIOTICS   Ciprofloxacin Nausea Only    Nausea    Doxazosin Nausea And Vomiting   Percocet [Oxycodone-Acetaminophen] Nausea And Vomiting    Patient Measurements: Height: 5\' 5"  (165.1 cm) Weight: 78.9 kg (174 lb) IBW/kg (Calculated) : 57 Heparin Dosing Weight:    Vital Signs: Temp: 98.6 F (37 C) (07/09 0727) Temp Source: Oral (07/09 0727) BP: 132/96 (07/09 0727) Pulse Rate: 71 (07/09 0727)  Labs: Recent Labs    12/31/20 1005 01/01/21 0846 01/01/21 0846 01/01/21 1939 01/01/21 2131 01/02/21 0703 01/02/21 1201 01/03/21 0601  HGB  --  10.9*   < >  --   --  10.2*  --  9.8*  HCT  --  33.9*  --   --   --  31.6*  --  30.3*  PLT  --  226  --   --   --  213  --  209  LABPROT 13.3  --   --   --   --   --   --   --   INR 1.0  --   --   --   --   --   --   --   HEPARINUNFRC  --   --   --   --   --  0.67 0.69 0.68  CREATININE 1.03* 1.10*  --   --   --  1.05*  --   --   TROPONINIHS  --   --   --  21* 24*  --   --   --    < > = values in this interval not displayed.     Estimated Creatinine Clearance: 47.3 mL/min (A) (by C-G formula based on SCr of 1.05 mg/dL (H)).    Assessment: Anticoag: Heparin for Afib with RVR  - Hep level 0.68, remains therapeutic on heparin drip at 1050 units/hr. . Hgb 10.2 down  to 9.8 today. PLTC stable within normal limits. No bleeding reported.   Goal of Therapy:  Heparin level 0.3-0.7 units/ml Monitor platelets by anticoagulation protocol: Yes   Plan:  Continue Heparin 1050 units/hr (10.5 ml/hr) Daily HL and CBC  Nicole Cella,  RPh Clinical Pharmacist [phone] Please check AMION for all [place] phone numbers After 10:00 PM, call [place] [phone]  01/03/2021,8:47 AM

## 2021-01-03 NOTE — Progress Notes (Signed)
PT Cancellation Note  Patient Details Name: [name] MRN: [id number] DOB: 1946/03/11   Cancelled Treatment:    Reason Eval/Treat Not Completed: Patient declined, no reason specified;Fatigue/lethargy limiting ability to participate.  Pt reports she was awake for several hours over night going to get a CT done.  She is too tired to work with PT.  She was agreeable for me to help get her in a better position to eat her lunch.    Thanks,  Kathryn Lennert, PT, DPT  Acute Rehabilitation Ortho Tech Supervisor [phone] pager #[phone] office      [name] [name] 01/03/2021, 2:52 PM

## 2021-01-04 LAB — COMPREHENSIVE METABOLIC PANEL
ALT: 43 U/L (ref 0–44)
AST: 59 U/L — ABNORMAL HIGH (ref 15–41)
Albumin: 2.3 g/dL — ABNORMAL LOW (ref 3.5–5.0)
Alkaline Phosphatase: 90 U/L (ref 38–126)
Anion gap: 8 (ref 5–15)
BUN: 12 mg/dL (ref 8–23)
CO2: 23 mmol/L (ref 22–32)
Calcium: 8.5 mg/dL — ABNORMAL LOW (ref 8.9–10.3)
Chloride: 107 mmol/L (ref 98–111)
Creatinine, Ser: 1.01 mg/dL — ABNORMAL HIGH (ref 0.44–1.00)
GFR, Estimated: 57 mL/min — ABNORMAL LOW (ref >60)
Glucose, Bld: 92 mg/dL (ref 70–99)
Potassium: 4.4 mmol/L (ref 3.5–5.1)
Sodium: 138 mmol/L (ref 135–145)
Total Bilirubin: 0.8 mg/dL (ref 0.3–1.2)
Total Protein: 5.4 g/dL — ABNORMAL LOW (ref 6.5–8.1)

## 2021-01-04 LAB — CBC
HCT: 31.1 % — ABNORMAL LOW (ref 36.0–46.0)
Hemoglobin: 10.2 g/dL — ABNORMAL LOW (ref 12.0–15.0)
MCH: 30.3 pg (ref 26.0–34.0)
MCHC: 32.8 g/dL (ref 30.0–36.0)
MCV: 92.3 fL (ref 80.0–100.0)
Platelets: 248 10*3/uL (ref 150–400)
RBC: 3.37 MIL/uL — ABNORMAL LOW (ref 3.87–5.11)
RDW: 15.3 % (ref 11.5–15.5)
WBC: 7.4 10*3/uL (ref 4.0–10.5)
nRBC: 0 % (ref 0.0–0.2)

## 2021-01-04 LAB — GLUCOSE, CAPILLARY
Glucose-Capillary: 79 mg/dL (ref 70–99)
Glucose-Capillary: 90 mg/dL (ref 70–99)
Glucose-Capillary: 156 mg/dL — ABNORMAL HIGH (ref 70–99)
Glucose-Capillary: 62 mg/dL — ABNORMAL LOW (ref 70–99)
Glucose-Capillary: 78 mg/dL (ref 70–99)

## 2021-01-04 NOTE — Progress Notes (Signed)
Physical Therapy Treatment Note  Session complete with full note to follow;  Noting significant unsteadiness when taxed; 2 losses of balance, and incr sway after walking about 40 ft;  Notified Dr. Verlon Au;  Full note to follow;   SATURATION QUALIFICATIONS: (This note is used to comply with regulatory documentation for home oxygen)  Patient Saturations on Room Air at Rest = 92%  Patient Saturations on Room Air while Ambulating = 85%  Patient Saturations on 2 Liters of oxygen while Ambulating = 96%  Please briefly explain why patient needs home oxygen: Patient requires supplemental oxygen to maintain oxygen saturations at acceptable, safe levels with physical activity.   Kathryn Wall, [place]  Acute Rehabilitation Services Pager [phone] Office [phone]

## 2021-01-04 NOTE — Evaluation (Signed)
Occupational Therapy Evaluation Patient Details Name: Kathryn Wall MRN: [id number] DOB: [birth year] Today's Date: 01/04/2021    History of Present Illness Pt is 75 yo female admitted on 12/30/20 with weakness and falls.  Pt found to have acute diverticulitis and PNE. She has medical hx of DM2, GERD, HTN, hyperlipidemia, renal disease, breast malignancy with lumpectomy and radiation in 1997.   Clinical Impression   Pt pleasant, Ox4, SBT 4 indicating normal cognition. Lives alone as endorses Indep with all ADL's, IADL's and using RW for mobility recently. Increased frequency of falls reported. Noted pt on O2 throughout session of 2L [place] sustaining no more than 94% at rest and activity. Pt currently presents with decreased activity tolerance, balance and strength leading to need for min guard up to min A for functional transfers and ADL's limited also this date line lines/leads. Pt appears with fair insight to concerns for d/c plan, and receptive to education on post acute therapy. Pending continued need for O2 and tolerance for OOB activity, pt endorsing dtr may be able to stay with her for 24hr S/A which pt would need for safe d/c to home with participation in [facility] services. Otherwise post acute to SNF is most appropriate to maximize indep and safety with ADL's and functional self care transfers to decrease risk of falls, burden of care and risk for rehospitalization.     Follow Up Recommendations  Home health OT;Supervision/Assistance - 24 hour;SNF    Equipment Recommendations  None recommended by OT    Recommendations for Other Services       Precautions / Restrictions Precautions Precautions: Fall Restrictions Weight Bearing Restrictions: No Other Position/Activity Restrictions: O2      Mobility Bed Mobility Overal bed mobility: Needs Assistance Bed Mobility: Supine to Sit;Sit to Supine     Supine to sit: Min assist Sit to supine: Supervision   General bed mobility comments:  increased time, hob slightly elevated    Transfers Overall transfer level: Needs assistance Equipment used: Rolling walker (2 wheeled);1 person hand held assist Transfers: Sit to/from [facility] Sit to Stand: Min guard Stand pivot transfers: Min guard       General transfer comment: increased A initially d/t line management and fatigue but no more than min guard provided with use of RW.    Balance                                           ADL either performed or assessed with clinical judgement   ADL Overall ADL's : Independent;Needs assistance/impaired Eating/Feeding: Independent   Grooming: Set up;Sitting           Upper Body Dressing : Minimal assistance;Sitting Upper Body Dressing Details (indicate cue type and reason): limited by lines Lower Body Dressing: Min guard;Sit to/from stand   Toilet Transfer: Designer, television/film set Details (indicate cue type and reason): up to min A for management of lines Toileting- Clothing Manipulation and Hygiene: Minimal assistance;Sit to/from stand         General ADL Comments: overall pt fluctuating from requiring min guard to min A for safe completion of seated and standing ADL's limited by tolerance and lines this date     Vision Baseline Vision/History: Wears glasses Wears Glasses: Reading only       Perception     Praxis      Pertinent Vitals/Pain Pain Assessment: No/denies pain  Hand Dominance Right   Extremity/Trunk Assessment Upper Extremity Assessment Upper Extremity Assessment: Generalized weakness   Lower Extremity Assessment Lower Extremity Assessment: Defer to PT evaluation   Cervical / Trunk Assessment Cervical / Trunk Assessment: Normal   Communication Communication Communication: No difficulties   Cognition Arousal/Alertness: Awake/alert Behavior During Therapy: WFL for tasks assessed/performed                                        General Comments  SBT 4 indicating normal cognition    Exercises     Shoulder Instructions      Home Living Family/patient expects to be discharged to:: Private residence Living Arrangements: Alone Available Help at Discharge: Family;Available PRN/intermittently Type of Home: House Home Access: Stairs to enter [employer] of Steps: 4 Entrance Stairs-Rails: Right;Left;Can reach both Home Layout: One level     Bathroom Shower/Tub: Occupational psychologist: Handicapped height Bathroom Accessibility: Yes   Home Equipment: Clinical cytogeneticist - 2 wheels;Cane - single point;Bedside commode;Walker - 4 wheels   Additional Comments: indep with all ADL's and IADL's, bubble pop for medication      Prior Functioning/Environment                   OT Problem List: Decreased strength;Decreased activity tolerance;Impaired balance (sitting and/or standing);Decreased safety awareness;Decreased knowledge of use of DME or AE;Decreased knowledge of precautions      OT Treatment/Interventions: Self-care/ADL training;Therapeutic exercise;Energy conservation;DME and/or AE instruction;Therapeutic activities;Patient/family education;Balance training    OT Goals(Current goals can be found in the care plan section) Acute Rehab OT Goals Patient Stated Goal: return home OT Goal Formulation: With patient Time For Goal Achievement: 01/18/21 Potential to Achieve Goals: Good ADL Goals Pt Will Perform Upper Body Dressing: with modified independence Pt Will Perform Lower Body Dressing: with modified independence Pt Will Transfer to Toilet: with modified independence;regular height toilet;ambulating Pt Will Perform Toileting - Clothing Manipulation and hygiene: with modified independence Pt Will Perform Tub/Shower Transfer: with modified independence  OT Frequency: Min 2X/week   Barriers to D/C:            Co-evaluation              AM-PAC OT "6 Clicks" Daily  Activity     Outcome Measure Help from another person eating meals?: None Help from another person taking care of personal grooming?: None Help from another person toileting, which includes using toliet, bedpan, or urinal?: A Little Help from another person bathing (including washing, rinsing, drying)?: A Little Help from another person to put on and taking off regular upper body clothing?: A Little Help from another person to put on and taking off regular lower body clothing?: A Little 6 Click Score: 20   End of Session Equipment Utilized During Treatment: Rolling walker;Oxygen Nurse Communication: Mobility status  Activity Tolerance: Patient tolerated treatment well Patient left: in chair;Other (comment);with call bell/phone within reach (PT present for session)  OT Visit Diagnosis: Unsteadiness on feet (R26.81);History of falling (Z91.81);Muscle weakness (generalized) (M62.81)                Time: 1325-1410 OT Time Calculation (min): 45 min Charges:  OT General Charges $OT Visit: 1 Visit OT Evaluation $OT Eval Low Complexity: 1 Low OT Treatments $Self Care/Home Management : 8-22 mins  Suri Tafolla OTR/L acute rehab services Office: [phone]  01/04/2021, 2:29 PM

## 2021-01-04 NOTE — Progress Notes (Signed)
Physical Therapy Treatment Patient Details Name: Kathryn Wall MRN: [id number] DOB: [birth year] Today's Date: 01/04/2021    History of Present Illness Pt is 75 yo female admitted on 12/30/20 with weakness and falls.  Pt found to have acute diverticulitis and PNE. She has medical hx of DM2, GERD, HTN, hyperlipidemia, renal disease, breast malignancy with lumpectomy and radiation in 1997.    PT Comments    Continuing work on functional mobility and activity tolerance;  Session focused on progressive amb and activity tolerance, to help with discerning safe dc plan; Pt's balance gets notably worse when taxed, and pt needed 2 rest breaks during our hallway ambulation; had a few losses of balance for which she needed physical assist to steady; Considering she lives alone, at this point, I recommend SNF for post-acute rehab to maximize independence and safety with mobility and ADLs prior to return home; See also previous note of this date for O2 qualifying walk note   Follow Up Recommendations  SNF;Other (comment) (for gait and balance, as well as endurance)     Equipment Recommendations  Other (comment) (supplemental O2)    Recommendations for Other Services       Precautions / Restrictions Precautions Precautions: Fall Precaution Comments: Fall risk increases significanlty with fatigue Restrictions Other Position/Activity Restrictions: O2    Mobility  Bed Mobility                    Transfers Overall transfer level: Needs assistance Equipment used: 4-wheeled walker Transfers: Sit to/from [facility] Sit to Stand: Min guard Stand pivot transfers: Min assist;Mod assist       General transfer comment: Noted unsteadiness when sitting down to rollator RW, in particular, with the turn to sit after setting the breaks; required mod assist to regain balance after a loss of balance turning to sit down to the rollator  Ambulation/Gait Ambulation/Gait assistance:  Min assist;Mod assist Gait Distance (Feet): 40 Feet (x3; with 2 seted rest breaks) Assistive device: 4-wheeled walker Gait Pattern/deviations: Step-through pattern;Decreased stride length     General Gait Details: Walked with Rollator; notable DOE and decr stabiltiy when taxed (after about 40 feet); Pt identified the decr stability as feeling "swaying"; Opted to push walking and endurance to help discern going home vs to post-acute rehab; 2 small losses of balance with the last 2 bouts of walking, needed light mod assist to steady;also ntable DOE with the need to take seted rest breaks; very fatigued at teh end of the walk   Stairs             Wheelchair Mobility    Modified Rankin (Stroke Patients Only)       Balance     Sitting balance-Leahy Scale: Good       Standing balance-Leahy Scale: Fair                              Cognition Arousal/Alertness: Awake/alert Behavior During Therapy: WFL for tasks assessed/performed Overall Cognitive Status: Within Functional Limits for tasks assessed (for simple mobility tasks)                                        Exercises      General Comments        Pertinent Vitals/Pain Pain Assessment: No/denies pain    Home Living  Prior Function            PT Goals (current goals can now be found in the care plan section) Acute Rehab PT Goals Patient Stated Goal: return home PT Goal Formulation: With patient/family Time For Goal Achievement: 01/14/21 Potential to Achieve Goals: Good Progress towards PT goals: Progressing toward goals    Frequency    Min 2X/week      PT Plan Discharge plan needs to be updated;Frequency needs to be updated    Co-evaluation              AM-PAC PT "6 Clicks" Mobility   Outcome Measure  Help needed turning from your back to your side while in a flat bed without using bedrails?: None Help needed moving from lying on  your back to sitting on the side of a flat bed without using bedrails?: A Little Help needed moving to and from a bed to a chair (including a wheelchair)?: A Little Help needed standing up from a chair using your arms (e.g., wheelchair or bedside chair)?: A Little Help needed to walk in hospital room?: A Little Help needed climbing 3-5 steps with a railing? : A Little 6 Click Score: 19    End of Session Equipment Utilized During Treatment: Gait belt;Oxygen Activity Tolerance: Patient tolerated treatment well Patient left: in chair;with call bell/phone within reach;with chair alarm set Nurse Communication: Mobility status PT Visit Diagnosis: Unsteadiness on feet (R26.81);Muscle weakness (generalized) (M62.81);Repeated falls (R29.6)     Time: 6734-1937 PT Time Calculation (min) (ACUTE ONLY): 40 min  Charges:  $Gait Training: 23-37 mins $Therapeutic Activity: 8-22 mins                     Roney Marion, PT  Acute Rehabilitation Services Pager [phone] Office [place] 01/04/2021, 5:46 PM

## 2021-01-04 NOTE — Progress Notes (Signed)
PROGRESS NOTE   Kathryn Wall  VFI:[id number] DOB: 12-08-1945 DOA: 12/30/2020 PCP: [name], MD  Brief Narrative:  75 year old white female known history HTN, DM TY 2 Breast cancer status post lumpectomy 1997 (XRT )reflux CKD 3 AA Mild cognitive issues followed by Dr. Logan Bores neurological Poor sleep schedule for sleep study currently on Valium for the same-cannot take Ambien because of sleepwalking Recent loss of husband Patient currently getting outpatient therapy at [facility], ED 7/5 weakness X 2 days + mechanical fall in addition to abdominal pain-she initially denied this to this examiner does not tell me she has chills or fevers Has not had cough T-max 100.3 WBC 18.3 abdominal tenderness left lower quadrant noted and also had on CT scan diverticulosis in addition to possible right lower lobe pneumonia  She developed A. fib with RVR paroxysmal and cardiology was consulted-she converted rapidly on 7/7 to sinus Echo is pending-currently maintaining sinus on metoprolol Overall is improving but will now need skilled facility placement because of debility with movement and weakness as per PT note 7/10  Hospital-Problem based course  P. Afib-occurred in setting of infection-unclear other precipitants--converted with push of Cardizem alone and remains in sinus rhythm on monitors EF 60-65%, mild LVH Eliquis 5 mg twice daily ,toprol XL--cardiology signed off 7/8 HTN-well ctrolld Losartan 50, amlodipine 5 mg ?  Diverticulitis Zosyn/azithromycin -->Augmentin 7/9-complete total 7 days ending 01/05/2021 ?  Right-sided pneumonia?  Aspiration Absolutely no cough or cold-2 view CXR 7/7 indeterminant to my overview and looks unchanged Currently on Augmentin DM TY 2 PCP was contemplating holding Actos 30--held Janumet 1 tab twice daily in addition to Jardiance 25 daily SSI now--CBGs ranging 79-144 CKD 3 AA Hypokalemia replace magnesium with p.o. Mag-Ox  400 twice daily resolved Encephalopathy 01/01/21 Patient has an underlying issue with cognitive deficits-she has not had any stroke-like symptoms and is moving all 4 limbs equally MR ordered 7/8 shows no acute findings and mild microvascular disease Cognitive deficits, recent falls--Work-up performed by Dr. Nancy Nordmann  -B12 and folate as well as ANA with reflex however abnormal--OP get B12 shots Will need outpatient initiation of positive pressure ventilation at night to be arranged by her neurologist Longstanding anxiety Valium--> 1 mg at night-continue Paxil 20, Seroquel 100 at night Daughter informs me on 7/8 falls could be 2/2 overmedication with the BZD's Her somnolence is resolving without the BZD's   DVT prophylaxis: loveneox Code Status: full  Family Communication:  Disposition:  Status is: Inpatient  Remains inpatient appropriate because:Inpatient level of care appropriate due to severity of illness  Dispo: The patient is from: Home              Anticipated d/c is to: Home              Patient currently is not medically stable to d/c.   Difficult to place patient No   Consultants:  None yet  Procedures:   Antimicrobials:  Azithro and Zosyn from 7/5-->7/9-->Augmentin   Subjective: Awake coherent no distress on oxygen More coherent than has been in the past several days in the mornings she is not taking the Xanax now no chest pain Multiple questions about medication she discharge and I have told her this needs to be deferred to therapy for safe plan given she is on a blood thinner  Objective: Vitals:   01/03/21 2333 01/04/21 0352 01/04/21 0723 01/04/21 1002  BP: 134/85 129/78 115/74 129/73  Pulse: 75 67 60 74  Resp: 19 14 16    Temp: 98.4 F (36.9 C) 97.9 F (36.6 C) 97.9 F (36.6 C)   TempSrc: Oral Oral Oral   SpO2: 97% 97% 98%   Weight:  81.4 kg    Height:        Intake/Output Summary (Last 24 hours) at 01/04/2021 1016 Last data filed at 01/03/2021 1623 Gross  per 24 hour  Intake --  Output 2001 ml  Net -2001 ml    Filed Weights   01/01/21 2000 01/04/21 0352  Weight: 78.9 kg 81.4 kg    Examination:  Coherent pleasant no distress no icterus no pallor Chest clear Abdomen slightly tender in lower quadrant less distended however trace lower extremity edema On monitors PVCs but sinus rhythm predominantly with some mild bradycardia Neurologically is grossly intact  Data Reviewed: personally reviewed   CBC    Component Value Date/Time   WBC 7.4 01/04/2021 0017   RBC 3.37 (L) 01/04/2021 0017   HGB 10.2 (L) 01/04/2021 0017   HGB 11.8 12/16/2020 1524   HCT 31.1 (L) 01/04/2021 0017   HCT 36.5 12/16/2020 1524   PLT 248 01/04/2021 0017   PLT 335 12/16/2020 1524   MCV 92.3 01/04/2021 0017   MCV 91 12/16/2020 1524   MCH 30.3 01/04/2021 0017   MCHC 32.8 01/04/2021 0017   RDW 15.3 01/04/2021 0017   RDW 13.7 12/16/2020 1524   LYMPHSABS 1.9 01/02/2021 0703   LYMPHSABS 2.2 12/16/2020 1524   MONOABS 0.9 01/02/2021 0703   EOSABS 0.4 01/02/2021 0703   EOSABS 0.3 12/16/2020 1524   BASOSABS 0.1 01/02/2021 0703   BASOSABS 0.1 12/16/2020 1524   CMP Latest Ref Rng & Units 01/04/2021 01/02/2021 01/01/2021  Glucose 70 - 99 mg/dL 92 90 94  BUN 8 - 23 mg/dL 12 12 15   Creatinine 0.44 - 1.00 mg/dL 1.01(H) 1.05(H) 1.10(H)  Sodium 135 - 145 mmol/L 138 135 136  Potassium 3.5 - 5.1 mmol/L 4.4 3.4(L) 3.4(L)  Chloride 98 - 111 mmol/L 107 105 103  CO2 22 - 32 mmol/L 23 26 24   Calcium 8.9 - 10.3 mg/dL 8.5(L) 8.2(L) 8.4(L)  Total Protein 6.5 - 8.1 g/dL 5.4(L) 5.7(L) 6.4(L)  Total Bilirubin 0.3 - 1.2 mg/dL 0.8 0.4 0.9  Alkaline Phos 38 - 126 U/L 90 88 69  AST 15 - 41 U/L 59(H) 78(H) 64(H)  ALT 0 - 44 U/L 43 47(H) 37     Radiology Studies: MR BRAIN W WO CONTRAST  Result Date: 01/03/2021 CLINICAL DATA:  Initial evaluation for delirium. EXAM: MRI HEAD WITHOUT AND WITH CONTRAST TECHNIQUE: Multiplanar, multiecho pulse sequences of the brain and surrounding  structures were obtained without and with intravenous contrast. CONTRAST:  81mL GADAVIST GADOBUTROL 1 MMOL/ML IV SOLN COMPARISON:  Prior CT from 01/01/2021. FINDINGS: Brain: Cerebral volume within normal limits. Scattered patchy T2/FLAIR hyperintensity within the periventricular and deep white matter both cerebral hemispheres most consistent with chronic small vessel ischemic disease, mild in nature. Small remote lacunar infarct present at the left frontal centrum semi ovale. Mild patchy involvement of the pons noted. No abnormal foci of restricted diffusion to suggest acute or subacute ischemia. Gray-white matter differentiation maintained. No encephalomalacia to suggest chronic cortical infarction. No evidence for acute or chronic intracranial hemorrhage. No mass lesion, midline shift or mass effect. No hydrocephalus or extra-axial fluid collection. Pituitary gland suprasellar region within normal limits. Midline structures intact. No abnormal enhancement. Vascular: Major intracranial vascular flow voids are maintained. Skull and upper cervical spine: Craniocervical junction within  normal limits. Bone marrow signal intensity normal. No scalp soft tissue abnormality. Sinuses/Orbits: Globes and orbital soft tissues within normal limits. Mild scattered mucosal thickening noted within the ethmoidal air cells. Paranasal sinuses are otherwise clear. Small left with trace right mastoid effusions noted. Visualized nasopharynx unremarkable. Inner ear structures grossly within normal limits. Other: None. IMPRESSION: 1. No acute intracranial abnormality. 2. Mild chronic microvascular ischemic disease for age. Electronically Signed   By: Jeannine Boga M.D.   On: 01/03/2021 04:12   ECHOCARDIOGRAM COMPLETE  Result Date: 01/02/2021    ECHOCARDIOGRAM REPORT   Patient Name:   BURMA KETCHER Mcginnity Date of Exam: 01/02/2021 Medical Rec #:  [id number]        Height:       65.0 in Accession #:    [id number]       Weight:       174.0  lb Date of Birth:  [birth year]        BSA:          1.864 m Patient Age:    14 years         BP:           108/61 mmHg Patient Gender: F                HR:           56 bpm. Exam Location:  Inpatient Procedure: 2D Echo, Cardiac Doppler and Color Doppler Indications:    Atrial fibrillation  History:        Patient has prior history of Echocardiogram examinations, most                 recent 10/03/2020. Risk Factors:Diabetes, Hypertension and                 Dyslipidemia.  Sonographer:    Luisa Hart RDCS Referring Phys: [place]  1. Left ventricular ejection fraction, by estimation, is 60 to 65%. The left ventricle has normal function. The left ventricle has no regional wall motion abnormalities. There is mild left ventricular hypertrophy. Left ventricular diastolic parameters were normal.  2. Right ventricular systolic function is normal. The right ventricular size is normal. There is normal pulmonary artery systolic pressure. The estimated right ventricular systolic pressure is 01.7 mmHg.  3. The mitral valve is normal in structure. Trivial mitral valve regurgitation. No evidence of mitral stenosis.  4. The aortic valve is tricuspid. Aortic valve regurgitation is not visualized. Mild to moderate aortic valve sclerosis/calcification is present, without any evidence of aortic stenosis.  5. The inferior vena cava is dilated in size with >50% respiratory variability, suggesting right atrial pressure of 8 mmHg. FINDINGS  Left Ventricle: Left ventricular ejection fraction, by estimation, is 60 to 65%. The left ventricle has normal function. The left ventricle has no regional wall motion abnormalities. The left ventricular internal cavity size was normal in size. There is  mild left ventricular hypertrophy. Left ventricular diastolic parameters were normal. Right Ventricle: The right ventricular size is normal. No increase in right ventricular wall thickness. Right ventricular systolic function  is normal. There is normal pulmonary artery systolic pressure. The tricuspid regurgitant velocity is 2.16 m/s, and  with an assumed right atrial pressure of 8 mmHg, the estimated right ventricular systolic pressure is 51.0 mmHg. Left Atrium: Left atrial size was normal in size. Right Atrium: Right atrial size was normal in size. Pericardium: Trivial pericardial effusion is present. Presence of pericardial fat pad. Mitral Valve: The  mitral valve is normal in structure. Trivial mitral valve regurgitation. No evidence of mitral valve stenosis. MV peak gradient, 3.4 mmHg. The mean mitral valve gradient is 1.0 mmHg. Tricuspid Valve: The tricuspid valve is normal in structure. Tricuspid valve regurgitation is mild. Aortic Valve: The aortic valve is tricuspid. Aortic valve regurgitation is not visualized. Mild to moderate aortic valve sclerosis/calcification is present, without any evidence of aortic stenosis. Aortic valve mean gradient measures 6.5 mmHg. Aortic valve peak gradient measures 10.8 mmHg. Aortic valve area, by VTI measures 0.88 cm. Pulmonic Valve: The pulmonic valve was not well visualized. Pulmonic valve regurgitation is not visualized. Aorta: The aortic root and ascending aorta are structurally normal, with no evidence of dilitation. Venous: The inferior vena cava is dilated in size with greater than 50% respiratory variability, suggesting right atrial pressure of 8 mmHg. IAS/Shunts: The interatrial septum was not well visualized.  LEFT VENTRICLE PLAX 2D LVIDd:         4.70 cm     Diastology LVIDs:         2.60 cm     LV e' medial:    7.29 cm/s LV PW:         1.00 cm     LV E/e' medial:  11.9 LV IVS:        0.70 cm     LV e' lateral:   8.16 cm/s LVOT diam:     1.35 cm     LV E/e' lateral: 10.6 LV SV:         33 LV SV Index:   18 LVOT Area:     1.43 cm  LV Volumes (MOD) LV vol d, MOD A2C: 53.0 ml LV vol d, MOD A4C: 61.1 ml LV vol s, MOD A2C: 19.6 ml LV vol s, MOD A4C: 20.1 ml LV SV MOD A2C:     33.4 ml LV  SV MOD A4C:     61.1 ml LV SV MOD BP:      37.2 ml RIGHT VENTRICLE RV Basal diam:  3.20 cm    PULMONARY VEINS RV Mid diam:    1.70 cm    A Reversal Duration: 85.00 msec RV S prime:     9.14 cm/s  A Reversal Velocity: 19.60 cm/s TAPSE (M-mode): 2.4 cm     Diastolic Velocity:  16.10 cm/s                            S/D Velocity:        1.00                            Systolic Velocity:   96.04 cm/s LEFT ATRIUM             Index       RIGHT ATRIUM           Index LA diam:        2.60 cm 1.39 cm/m  RA Area:     11.60 cm LA Vol (A2C):   27.0 ml 14.48 ml/m RA Volume:   25.70 ml  13.78 ml/m LA Vol (A4C):   40.8 ml 21.88 ml/m LA Biplane Vol: 33.5 ml 17.97 ml/m  AORTIC VALVE                    PULMONIC VALVE AV Area (Vmax):    0.98 cm     PV Vmax:  0.67 m/s AV Area (Vmean):   0.91 cm     PV Vmean:      50.200 cm/s AV Area (VTI):     0.88 cm     PV VTI:        0.189 m AV Vmax:           164.00 cm/s  PV Peak grad:  1.8 mmHg AV Vmean:          121.500 cm/s PV Mean grad:  1.0 mmHg AV VTI:            0.374 m AV Peak Grad:      10.8 mmHg AV Mean Grad:      6.5 mmHg LVOT Vmax:         112.00 cm/s LVOT Vmean:        77.200 cm/s LVOT VTI:          0.230 m LVOT/AV VTI ratio: 0.61  AORTA Ao Root diam: 2.90 cm Ao Asc diam:  3.05 cm MITRAL VALVE                 TRICUSPID VALVE MV Area (PHT): 2.95 cm      TR Peak grad:   18.7 mmHg MV Area VTI:   0.88 cm      TR Vmax:        216.00 cm/s MV Peak grad:  3.4 mmHg MV Mean grad:  1.0 mmHg      SHUNTS MV Vmax:       0.93 m/s      Systemic VTI:  0.23 m MV Vmean:      52.9 cm/s     Systemic Diam: 1.35 cm MV Decel Time: 257 msec MR Peak grad:    67.9 mmHg MR Mean grad:    51.0 mmHg MR Vmax:         412.00 cm/s MR Vmean:        347.0 cm/s MR PISA:         1.57 cm MR PISA Eff ROA: 9 mm MR PISA Radius:  0.50 cm MV E velocity: 86.70 cm/s MV A velocity: 74.10 cm/s MV E/A ratio:  1.17 Oswaldo Milian MD Electronically signed by Oswaldo Milian MD Signature Date/Time:  01/02/2021/1:36:01 PM    Final      Scheduled Meds:  amLODipine  5 mg Oral Daily   amoxicillin-clavulanate  1 tablet Oral Q12H   apixaban  5 mg Oral BID   atorvastatin  40 mg Oral QHS   diltiazem  10 mg Intravenous Once   empagliflozin  25 mg Oral Daily   insulin aspart  0-15 Units Subcutaneous TID AC & HS   linagliptin  5 mg Oral Daily   And   metFORMIN  1,000 mg Oral BID WC   losartan  50 mg Oral Daily   magnesium oxide  400 mg Oral BID   meloxicam  7.5 mg Oral Daily   metoprolol succinate  25 mg Oral Daily   pantoprazole  40 mg Oral q morning   PARoxetine  20 mg Oral Daily   QUEtiapine  100 mg Oral QHS   Continuous Infusions:     LOS: 4 days   Time spent: 23  Nita Sells, MD [facility] To contact the attending provider between 7A-7P or the covering provider during after hours 7P-7A, please log into the web site [email or web address] and access using universal [place] password for that web site. If you do not have the password, please call the  hospital operator.  01/04/2021, 10:16 AM

## 2021-01-05 ENCOUNTER — Other Ambulatory Visit (HOSPITAL_COMMUNITY): Payer: Self-pay

## 2021-01-05 ENCOUNTER — Telehealth: Payer: Self-pay

## 2021-01-05 ENCOUNTER — Other Ambulatory Visit: Payer: Medicare Other

## 2021-01-05 LAB — CULTURE, BLOOD (ROUTINE X 2)
Culture: NO GROWTH
Culture: NO GROWTH
Special Requests: ADEQUATE

## 2021-01-05 LAB — GLUCOSE, CAPILLARY
Glucose-Capillary: 75 mg/dL (ref 70–99)
Glucose-Capillary: 119 mg/dL — ABNORMAL HIGH (ref 70–99)
Glucose-Capillary: 96 mg/dL (ref 70–99)

## 2021-01-05 LAB — SARS CORONAVIRUS 2 (TAT 6-24 HRS): SARS Coronavirus 2: NEGATIVE

## 2021-01-05 MED ORDER — METOPROLOL SUCCINATE ER 25 MG PO TB24: 25.0000 mg | ORAL_TABLET | Freq: Every day | ORAL | Status: DC

## 2021-01-05 MED ORDER — MELOXICAM 7.5 MG PO TABS
7.5000 mg | ORAL_TABLET | Freq: Every day | ORAL | Status: DC
Start: 2021-01-05 — End: 2021-02-03

## 2021-01-05 MED ORDER — AMOXICILLIN-POT CLAVULANATE 875-125 MG PO TABS: 1.0000 | ORAL_TABLET | Freq: Two times a day (BID) | ORAL | 0 refills | Status: DC

## 2021-01-05 MED ORDER — ACETAMINOPHEN 325 MG PO TABS: 650.0000 mg | ORAL_TABLET | Freq: Four times a day (QID) | ORAL | Status: DC | PRN

## 2021-01-05 MED ORDER — PAROXETINE HCL 20 MG PO TABS: 20.0000 mg | ORAL_TABLET | Freq: Every day | ORAL | 0 refills | Status: DC

## 2021-01-05 MED ORDER — APIXABAN 5 MG PO TABS: 5.0000 mg | ORAL_TABLET | Freq: Two times a day (BID) | ORAL | 0 refills | Status: DC

## 2021-01-05 NOTE — TOC Benefit Eligibility Note (Signed)
Patient Teacher, English as a foreign language completed.    The patient is currently admitted and upon discharge could be taking Eliquis 5 mg.  The current 30 day co-pay is, $141.49 due to being in Coverage Gap (donut hole).   The patient is currently admitted and upon discharge could be taking Xarelto 20 mg.  The current 30 day co-pay is, $138.19 due to being in Coverage Gap (donut hole).   The patient is currently admitted and upon discharge could be taking Pradaxa 150 mg.  Non Formulary  The patient is insured through [place] [place] Patient Advocate Specialist [place] Team Direct Number: [phone]  Fax: [phone]

## 2021-01-05 NOTE — Telephone Encounter (Signed)
Dtr said pt is currently in the hospital and then she has to go to rehab for her leg. They will call back to schedule sleep study once she is out of rehab.

## 2021-01-05 NOTE — TOC Transition Note (Signed)
Transition of Care [facility]) - CM/SW Discharge Note   Patient Details  Name: Kathryn Wall MRN: [id number] Date of Birth: [birth year]  [facility]) CM/SW Contact:  Benard Halsted, LCSW Phone Number: 01/05/2021, 5:24 PM   Clinical Narrative:    Patient will DC to: [facility] Anticipated DC date: 01/05/21 Family notified: Daughter, Theatre stage manager by: T[place] by car   Per MD patient ready for DC to [facility]. RN to call report prior to discharge [phone] Room 104). RN, patient, patient's family, and facility notified of DC. Discharge Summary and FL2 sent to facility.   CSW will sign off for now as social work intervention is no longer needed. Please consult [place] again if new needs arise.     Final next level of care: Skilled Nursing Facility Barriers to Discharge: Barriers Resolved   Patient Goals and CMS Choice Patient states their goals for this hospitalization and ongoing recovery are:: Rehab CMS Medicare.gov Compare Post Acute Care list provided to:: Patient Choice offered to / list presented to : Patient, Adult Children  Discharge Placement   Existing PASRR number confirmed : 01/05/21          Patient chooses bed at: [facility] Patient to be transferred to facility by: car Name of family member notified: Daughter Patient and family notified of of transfer: 01/05/21  Discharge Plan and Services In-house Referral: Clinical Social Work   Post Acute Care Choice: [place]                               Social Determinants of Health (SDOH) Interventions     Readmission Risk Interventions No flowsheet data found.

## 2021-01-05 NOTE — Progress Notes (Signed)
Report called to [facility], [phone]   Patients daughter arrived to take patient to SNF. Ambulatory O2 in the room was 89-92%. Patient and daughter were escorted out via [place] by [place]. AVS given to daughter.

## 2021-01-05 NOTE — Care Management Important Message (Signed)
Important Message  Patient Details  Name: Kathryn Wall MRN: [id number] Date of Birth: September 06, 1945   Medicare Important Message Given:  Yes - Important Message mailed due to current National Emergency  Verbal consent obtained due to current National Emergency  Relationship to patient: Self Contact Name: Eleah Call Date: 01/05/21  Time: 7262 Phone: [phone] Outcome: No Answer/Busy Important Message mailed to: Patient address on file    Delorse Lek 01/05/2021, 3:15 PM

## 2021-01-05 NOTE — Discharge Summary (Signed)
Physician Discharge Summary  ELEANA TOCCO WFU:[id number] DOB: 03-02-1946 DOA: 12/30/2020  PCP: [name], MD  Admit date: 12/30/2020 Discharge date: 01/05/2021  Time spent: 37 minutes  Recommendations for Outpatient Follow-up:  New meds this admission-Toprol-XL, Eliquis--need TOCvisit with Cards Complete Augmentin 7/13 for potential diverticulitis/right-sided pneumonia Needs oxygen 2 liters on d/c Needs Chem-12 CBC 1 week Needs TSH in about 4 weeks Recommend outpatient follow-up Dr. Nancy Nordmann neurology regarding cognitive issues  Avoid BZD's continue other medications for anxiety Interval outpatient breast mammograms as per PCP given history of breast cancer Will need outpatient rheumatology follow-up for [+] ANA which will be coordinated by her neurologist Outpatient sleep study to be rescheduled as above  Discharge Diagnoses:  MAIN problem for hospitalization   Please see below for itemized issues addressed in [place]- refer to other progress notes for clarity if needed  Discharge Condition: Fair  Diet recommendation: Heart healthy diabetic  Filed Weights   01/01/21 2000 01/04/21 0352  Weight: 78.9 kg 81.4 kg    History of present illness:  75 year old white female known history HTN, DM TY 2 Breast cancer status post lumpectomy 1997 (XRT )reflux CKD 3 AA Mild cognitive issues followed by Dr. Logan Bores neurological Poor sleep schedule for sleep study currently on Valium for the same-cannot take Ambien because of sleepwalking Recent loss of husband Patient currently getting outpatient therapy at [facility], ED 7/5 weakness X 2 days + mechanical fall in addition to abdominal pain-she initially denied this to this examiner does not tell me she has chills or fevers Has not had cough T-max 100.3 WBC 18.3 abdominal tenderness left lower quadrant noted and also had on CT scan diverticulosis in addition to possible right lower lobe pneumonia    She developed A. fib with RVR paroxysmal and cardiology was consulted-she converted rapidly on 7/7 to sinus -maintaining sinus on metoprolol Overall is improving but will now need skilled facility placement because of debility with movement and weakness as per PT note 7/10  Hospital Course:  P. Afib-occurred in setting of infection-unclear other precipitants--converted with push of Cardizem alone and remains in sinus rhythm on monitors EF 60-65%, mild LVH Eliquis 5 mg twice daily ,toprol XL--cardiology signed off 7/8 The stabilized during hospital stay and will need outpatient TOC visit with cardiology to be coordinated post skilled visit HTN-well ctrlld Losartan 50, amlodipine 5 mg ?  Diverticulitis Zosyn/azithromycin -->Augmentin 7/9-complete on 7/13 ?  Right-sided pneumonia?  Aspiration Absolutely no cough or cold-2 view CXR 7/7 indeterminant to my overview and looks unchanged--antibiotics as above should cover Discharging on oxygen--hypoxia likely 2/2 to OSA DM TY 2 PCP was contemplating holding Actos 30--held Janumet 1 tab twice daily in addition to Jardiance 25 daily SSI now--CBGs moderately well-controlled during hospitalization CKD 3 AA Hypokalemia replace magnesium with p.o. Mag-Ox 400 twice daily Resolved Labs as outpatient Encephalopathy 01/01/21 Patient has an underlying issue with cognitive deficits-she has not had any stroke-like symptoms and is moving all 4 limbs equally MR ordered 7/8 shows no acute findings and mild microvascular disease--- this is visible to her neurologist forfollow-up Cognitive deficits, recent falls--Work-up performed by Dr. Nancy Nordmann -B12 and folate as well as ANA with reflex however abnormal--OP get B12 shots Will need outpatient initiation of positive pressure ventilation at night to be arranged by her neurologist Longstanding anxiety Valium--> 1 mg at night-continue Paxil 20, Seroquel 100 at night Daughter informs me on 7/8 falls could be 2/2  overmedication with the BZD's Her  somnolence resolved well when she was tapered off of BZD  Procedures: Echocardiogram  Consultations: Cardiology  Discharge Exam: Vitals:   01/05/21 0406 01/05/21 0731  BP: 128/76 122/63  Pulse: (!) 59 (!) 58  Resp: 12 13  Temp: 98.7 F (37.1 C) 97.6 F (36.4 C)  SpO2: 96% 90%   Subj on day of d/c   Awake coherent no distress sats in the 80s off of oxygen but rebound nicely to the 90s She is coherent pleasant and asking when she can discharge she has no pain no fever no chills no abdominal pain did not pass stool overnight no sputum  General Exam on discharge  EOMI Pleasant coherent no distress Anterior chest wall clear to exam no rales no rhonchi Abdomen soft no rebound no guarding No lower extremity edema No rash Psych euthymic and less sleepy  Discharge Instructions   Discharge Instructions     Diet - low sodium heart healthy   Complete by: As directed    Increase activity slowly   Complete by: As directed       Allergies as of 01/05/2021       Reactions   Allergen [antipyrine-benzocaine] Other (See Comments)   Unknown Reaction   Ambien [zolpidem Tartrate] Other (See Comments)   Can not tolerate, causes sleepwalking   Cymbalta [duloxetine Hcl] Other (See Comments)   Unknown Reaction   Other    SENSITIVE TO ANTIBIOTICS   Ciprofloxacin Nausea Only   Nausea   Doxazosin Nausea And Vomiting   Percocet [oxycodone-acetaminophen] Nausea And Vomiting        Medication List     STOP taking these medications    diazepam 5 MG tablet Commonly known as: VALIUM   pioglitazone 30 MG tablet Commonly known as: ACTOS       TAKE these medications    acetaminophen 325 MG tablet Commonly known as: TYLENOL Take 2 tablets (650 mg total) by mouth every 6 (six) hours as needed for mild pain (or Fever >/= 101).   amLODipine 5 MG tablet Commonly known as: NORVASC TAKE 1 TABLET BY MOUTH ONCE DAILY   amoxicillin-clavulanate  875-125 MG tablet Commonly known as: AUGMENTIN Take 1 tablet by mouth every 12 (twelve) hours.   apixaban 5 MG Tabs tablet Commonly known as: ELIQUIS Take 1 tablet (5 mg total) by mouth 2 (two) times daily.   atorvastatin 40 MG tablet Commonly known as: LIPITOR TAKE 1 TABLET BY MOUTH AT BEDTIME What changed: when to take this   cholecalciferol 25 MCG (1000 UNIT) tablet Commonly known as: VITAMIN D3 Take 1,000 Units by mouth daily.   Janumet 50-1000 MG tablet Generic drug: sitaGLIPtin-metformin TAKE 1 TABLET BY MOUTH TWICE A DAY WITH A MEAL   Jardiance 25 MG Tabs tablet Generic drug: empagliflozin TAKE 1 TABLET BY MOUTH ONCE A DAY What changed: how much to take   losartan 50 MG tablet Commonly known as: COZAAR TAKE 1 TABLET BY MOUTH ONCE A DAY   meloxicam 7.5 MG tablet Commonly known as: MOBIC Take 1 tablet (7.5 mg total) by mouth daily. What changed:  medication strength how much to take   metoprolol succinate 25 MG 24 hr tablet Commonly known as: TOPROL-XL Take 1 tablet (25 mg total) by mouth daily.   nystatin ointment Commonly known as: MYCOSTATIN APPLY TO AFFECTED AREAS TWICE DAILY What changed: See the new instructions.   pantoprazole 40 MG tablet Commonly known as: PROTONIX TAKE 1 TABLET BY MOUTH EVERY MORNING What changed: when to  take this   PARoxetine 20 MG tablet Commonly known as: PAXIL Take 1 tablet (20 mg total) by mouth daily.   QUEtiapine 50 MG tablet Commonly known as: SEROQUEL Take 100 mg by mouth at bedtime.   VITA-C PO Take 1 tablet by mouth daily.       Allergies  Allergen Reactions   Allergen [Antipyrine-Benzocaine] Other (See Comments)    Unknown Reaction   Ambien [Zolpidem Tartrate] Other (See Comments)    Can not tolerate, causes sleepwalking   Cymbalta [Duloxetine Hcl] Other (See Comments)    Unknown Reaction   Other     SENSITIVE TO ANTIBIOTICS   Ciprofloxacin Nausea Only    Nausea    Doxazosin Nausea And Vomiting    Percocet [Oxycodone-Acetaminophen] Nausea And Vomiting    Follow-up Information     [name], MD.   Specialty: Family Medicine Contact information: [address] [place] [address] [phone]                  The results of significant diagnostics from this hospitalization (including imaging, microbiology, ancillary and laboratory) are listed below for reference.    Significant Diagnostic Studies: DG Chest 1 View  Result Date: 12/30/2020 CLINICAL DATA:  Generalized weakness.  Fall 2 hours ago EXAM: CHEST  1 VIEW COMPARISON:  02/21/2020 FINDINGS: The heart size and mediastinal contours are within normal limits. Both lungs are clear. The visualized skeletal structures are unremarkable. IMPRESSION: No active disease. Electronically Signed   By: Miachel Roux M.D.   On: 12/30/2020 13:26   DG Chest 2 View  Result Date: 01/01/2021 CLINICAL DATA:  Pneumonia.  Shortness of breath. EXAM: CHEST - 2 VIEW COMPARISON:  12/30/2020.  09/05/2020. FINDINGS: Mediastinum and hilar structures normal. Heart size normal. Prominent density, most likely infiltrate, noted along the posterior lungs on lateral view. This is difficult to identify on PA view it but is most likely on the right. Close follow-up chest x-rays recommended to demonstrate resolution. Costophrenic angles are clear, no pleural effusion noted. No pneumothorax. IMPRESSION: Prominent density, most likely infiltrate, noted along the posterior lungs on lateral view. This is difficult to identify on the PA view but is most likely on the right. Close follow-up chest x-rays recommended demonstrate clearing. Electronically Signed   By: Marcello Moores  Register   On: 01/01/2021 09:07   CT HEAD WO CONTRAST  Result Date: 01/01/2021 CLINICAL DATA:  Altered mental status EXAM: CT HEAD WITHOUT CONTRAST TECHNIQUE: Contiguous axial images were obtained from the base of the skull through the vertex without intravenous contrast. COMPARISON:   02/22/2020 FINDINGS: Brain: No evidence of acute infarction, hemorrhage, hydrocephalus, extra-axial collection or mass lesion/mass effect. Global cortical and central atrophy. Subcortical white matter and periventricular small vessel ischemic changes. Vascular: No hyperdense vessel or unexpected calcification. Skull: Normal. Negative for fracture or focal lesion. Sinuses/Orbits: The visualized paranasal sinuses are essentially clear. The mastoid air cells are unopacified. Other: None. IMPRESSION: No evidence of acute intracranial abnormality. Atrophy with small vessel ischemic changes. Electronically Signed   By: Julian Hy M.D.   On: 01/01/2021 03:43   MR BRAIN W WO CONTRAST  Result Date: 01/03/2021 CLINICAL DATA:  Initial evaluation for delirium. EXAM: MRI HEAD WITHOUT AND WITH CONTRAST TECHNIQUE: Multiplanar, multiecho pulse sequences of the brain and surrounding structures were obtained without and with intravenous contrast. CONTRAST:  42mL GADAVIST GADOBUTROL 1 MMOL/ML IV SOLN COMPARISON:  Prior CT from 01/01/2021. FINDINGS: Brain: Cerebral volume within normal limits. Scattered  patchy T2/FLAIR hyperintensity within the periventricular and deep white matter both cerebral hemispheres most consistent with chronic small vessel ischemic disease, mild in nature. Small remote lacunar infarct present at the left frontal centrum semi ovale. Mild patchy involvement of the pons noted. No abnormal foci of restricted diffusion to suggest acute or subacute ischemia. Gray-white matter differentiation maintained. No encephalomalacia to suggest chronic cortical infarction. No evidence for acute or chronic intracranial hemorrhage. No mass lesion, midline shift or mass effect. No hydrocephalus or extra-axial fluid collection. Pituitary gland suprasellar region within normal limits. Midline structures intact. No abnormal enhancement. Vascular: Major intracranial vascular flow voids are maintained. Skull and upper  cervical spine: Craniocervical junction within normal limits. Bone marrow signal intensity normal. No scalp soft tissue abnormality. Sinuses/Orbits: Globes and orbital soft tissues within normal limits. Mild scattered mucosal thickening noted within the ethmoidal air cells. Paranasal sinuses are otherwise clear. Small left with trace right mastoid effusions noted. Visualized nasopharynx unremarkable. Inner ear structures grossly within normal limits. Other: None. IMPRESSION: 1. No acute intracranial abnormality. 2. Mild chronic microvascular ischemic disease for age. Electronically Signed   By: Jeannine Boga M.D.   On: 01/03/2021 04:12   CT Abdomen Pelvis W Contrast  Result Date: 12/30/2020 CLINICAL DATA:  Acute abdominal pain. Two falls in the last 24 hours, slipped in the bathroom. Diarrhea. EXAM: CT ABDOMEN AND PELVIS WITH CONTRAST TECHNIQUE: Multidetector CT imaging of the abdomen and pelvis was performed using the standard protocol following bolus administration of intravenous contrast. CONTRAST:  3mL OMNIPAQUE IOHEXOL 300 MG/ML  SOLN COMPARISON:  Remote abdominopelvic CT 05/27/2011 in 01/07/2006. Chest radiograph earlier this day FINDINGS: Lower chest: Patchy dependent opacity in the right lower lobe, greater than typically seen with atelectasis. No pleural fluid. Hepatobiliary: Diffuse hepatic steatosis. Area of low-density in the subcapsular right lobe measuring 2.9 cm likely represents a hemangioma, and is slightly smaller than on 2007 exam. No additional focal hepatic lesion. Clips in the gallbladder fossa postcholecystectomy. No biliary dilatation. Pancreas: No ductal dilatation or inflammation. Spleen: Normal in size without focal abnormality. Splenule at the hilum. Adrenals/Urinary Tract: Normal adrenal glands. Lobulated bilateral renal contours with cortical thinning and areas of scarring. Small low-density cortical lesions are likely cysts but too small to accurately characterize. There is  symmetric excretion on delayed phase imaging as well as homogeneous renal enhancement partially distended urinary bladder without wall thickening. Stomach/Bowel: Decompressed stomach. Unremarkable duodenum. Occasional loops of fluid-filled nondilated bowel in the pelvis without wall thickening or inflammation. Appendectomy. Multifocal colonic diverticula the equivocal faint fat stranding about junction of distal descending and sigmoid colon, series 6, image 72, possible mild diverticulitis. No colonic wall thickening. Vascular/Lymphatic: Moderate aortic atherosclerosis. Patent portal vein. No acute vascular findings. No enlarged lymph nodes in the abdomen or pelvis Reproductive: Hysterectomy.  No adnexal mass.  Quiescent ovaries. Other: No ascites or free air no abdominal wall hernia Musculoskeletal: Scoliosis and diffuse degenerative change throughout the spine. There is a prominent Schmorl's node in T9 superior endplate. No acute osseous abnormalities. No fracture of included ribs, spine, or pelvis. IMPRESSION: 1. Colonic diverticulosis with equivocal faint fat stranding about the junction of distal descending and sigmoid colon, possible mild diverticulitis. No perforation or abscess. 2. Patchy dependent opacity in the right lower lobe. This is greater than typically seen with atelectasis, and could represent pneumonia in the appropriate clinical setting. 3. Bilateral renal cortical scarring. Aortic Atherosclerosis (ICD10-I70.0). Electronically Signed   By: Keith Rake M.D.   On: 12/30/2020 22:36  ECHOCARDIOGRAM COMPLETE  Result Date: 01/02/2021    ECHOCARDIOGRAM REPORT   Patient Name:   SHAWNDA MAUNEY Date of Exam: 01/02/2021 Medical Rec #:  [id number]        Height:       65.0 in Accession #:    [id number]       Weight:       174.0 lb Date of Birth:  Nov 13, 1945        BSA:          1.864 m Patient Age:    26 years         BP:           108/61 mmHg Patient Gender: F                HR:           56 bpm.  Exam Location:  Inpatient Procedure: 2D Echo, Cardiac Doppler and Color Doppler Indications:    Atrial fibrillation  History:        Patient has prior history of Echocardiogram examinations, most                 recent 10/03/2020. Risk Factors:Diabetes, Hypertension and                 Dyslipidemia.  Sonographer:    Luisa Hart RDCS Referring Phys: [place]  1. Left ventricular ejection fraction, by estimation, is 60 to 65%. The left ventricle has normal function. The left ventricle has no regional wall motion abnormalities. There is mild left ventricular hypertrophy. Left ventricular diastolic parameters were normal.  2. Right ventricular systolic function is normal. The right ventricular size is normal. There is normal pulmonary artery systolic pressure. The estimated right ventricular systolic pressure is 97.9 mmHg.  3. The mitral valve is normal in structure. Trivial mitral valve regurgitation. No evidence of mitral stenosis.  4. The aortic valve is tricuspid. Aortic valve regurgitation is not visualized. Mild to moderate aortic valve sclerosis/calcification is present, without any evidence of aortic stenosis.  5. The inferior vena cava is dilated in size with >50% respiratory variability, suggesting right atrial pressure of 8 mmHg. FINDINGS  Left Ventricle: Left ventricular ejection fraction, by estimation, is 60 to 65%. The left ventricle has normal function. The left ventricle has no regional wall motion abnormalities. The left ventricular internal cavity size was normal in size. There is  mild left ventricular hypertrophy. Left ventricular diastolic parameters were normal. Right Ventricle: The right ventricular size is normal. No increase in right ventricular wall thickness. Right ventricular systolic function is normal. There is normal pulmonary artery systolic pressure. The tricuspid regurgitant velocity is 2.16 m/s, and  with an assumed right atrial pressure of 8 mmHg, the  estimated right ventricular systolic pressure is 89.2 mmHg. Left Atrium: Left atrial size was normal in size. Right Atrium: Right atrial size was normal in size. Pericardium: Trivial pericardial effusion is present. Presence of pericardial fat pad. Mitral Valve: The mitral valve is normal in structure. Trivial mitral valve regurgitation. No evidence of mitral valve stenosis. MV peak gradient, 3.4 mmHg. The mean mitral valve gradient is 1.0 mmHg. Tricuspid Valve: The tricuspid valve is normal in structure. Tricuspid valve regurgitation is mild. Aortic Valve: The aortic valve is tricuspid. Aortic valve regurgitation is not visualized. Mild to moderate aortic valve sclerosis/calcification is present, without any evidence of aortic stenosis. Aortic valve mean gradient measures 6.5 mmHg. Aortic valve peak gradient measures 10.8 mmHg. Aortic valve  area, by VTI measures 0.88 cm. Pulmonic Valve: The pulmonic valve was not well visualized. Pulmonic valve regurgitation is not visualized. Aorta: The aortic root and ascending aorta are structurally normal, with no evidence of dilitation. Venous: The inferior vena cava is dilated in size with greater than 50% respiratory variability, suggesting right atrial pressure of 8 mmHg. IAS/Shunts: The interatrial septum was not well visualized.  LEFT VENTRICLE PLAX 2D LVIDd:         4.70 cm     Diastology LVIDs:         2.60 cm     LV e' medial:    7.29 cm/s LV PW:         1.00 cm     LV E/e' medial:  11.9 LV IVS:        0.70 cm     LV e' lateral:   8.16 cm/s LVOT diam:     1.35 cm     LV E/e' lateral: 10.6 LV SV:         33 LV SV Index:   18 LVOT Area:     1.43 cm  LV Volumes (MOD) LV vol d, MOD A2C: 53.0 ml LV vol d, MOD A4C: 61.1 ml LV vol s, MOD A2C: 19.6 ml LV vol s, MOD A4C: 20.1 ml LV SV MOD A2C:     33.4 ml LV SV MOD A4C:     61.1 ml LV SV MOD BP:      37.2 ml RIGHT VENTRICLE RV Basal diam:  3.20 cm    PULMONARY VEINS RV Mid diam:    1.70 cm    A Reversal Duration: 85.00 msec  RV S prime:     9.14 cm/s  A Reversal Velocity: 19.60 cm/s TAPSE (M-mode): 2.4 cm     Diastolic Velocity:  46.50 cm/s                            S/D Velocity:        1.00                            Systolic Velocity:   35.46 cm/s LEFT ATRIUM             Index       RIGHT ATRIUM           Index LA diam:        2.60 cm 1.39 cm/m  RA Area:     11.60 cm LA Vol (A2C):   27.0 ml 14.48 ml/m RA Volume:   25.70 ml  13.78 ml/m LA Vol (A4C):   40.8 ml 21.88 ml/m LA Biplane Vol: 33.5 ml 17.97 ml/m  AORTIC VALVE                    PULMONIC VALVE AV Area (Vmax):    0.98 cm     PV Vmax:       0.67 m/s AV Area (Vmean):   0.91 cm     PV Vmean:      50.200 cm/s AV Area (VTI):     0.88 cm     PV VTI:        0.189 m AV Vmax:           164.00 cm/s  PV Peak grad:  1.8 mmHg AV Vmean:          121.500 cm/s PV  Mean grad:  1.0 mmHg AV VTI:            0.374 m AV Peak Grad:      10.8 mmHg AV Mean Grad:      6.5 mmHg LVOT Vmax:         112.00 cm/s LVOT Vmean:        77.200 cm/s LVOT VTI:          0.230 m LVOT/AV VTI ratio: 0.61  AORTA Ao Root diam: 2.90 cm Ao Asc diam:  3.05 cm MITRAL VALVE                 TRICUSPID VALVE MV Area (PHT): 2.95 cm      TR Peak grad:   18.7 mmHg MV Area VTI:   0.88 cm      TR Vmax:        216.00 cm/s MV Peak grad:  3.4 mmHg MV Mean grad:  1.0 mmHg      SHUNTS MV Vmax:       0.93 m/s      Systemic VTI:  0.23 m MV Vmean:      52.9 cm/s     Systemic Diam: 1.35 cm MV Decel Time: 257 msec MR Peak grad:    67.9 mmHg MR Mean grad:    51.0 mmHg MR Vmax:         412.00 cm/s MR Vmean:        347.0 cm/s MR PISA:         1.57 cm MR PISA Eff ROA: 9 mm MR PISA Radius:  0.50 cm MV E velocity: 86.70 cm/s MV A velocity: 74.10 cm/s MV E/A ratio:  1.17 Oswaldo Milian MD Electronically signed by Oswaldo Milian MD Signature Date/Time: 01/02/2021/1:36:01 PM    Final     Microbiology: Recent Results (from the past 240 hour(s))  SARS CORONAVIRUS 2 (TAT 6-24 HRS) Nasopharyngeal Nasopharyngeal Swab     Status:  None   Collection Time: 12/30/20 12:37 PM   Specimen: Nasopharyngeal Swab  Result Value Ref Range Status   SARS Coronavirus 2 NEGATIVE NEGATIVE Final    Comment: (NOTE) SARS-CoV-2 target nucleic acids are NOT DETECTED.  The SARS-CoV-2 RNA is generally detectable in upper and lower respiratory specimens during the acute phase of infection. Negative results do not preclude SARS-CoV-2 infection, do not rule out co-infections with other pathogens, and should not be used as the sole basis for treatment or other patient management decisions. Negative results must be combined with clinical observations, patient history, and epidemiological information. The expected result is Negative.  Fact Sheet for Patients: SugarRoll.be  Fact Sheet for Healthcare Providers: https://www.woods-mathews.com/  This test is not yet approved or cleared by the [place] FDA and  has been authorized for detection and/or diagnosis of SARS-CoV-2 by FDA under an Emergency Use Authorization (EUA). This EUA will remain  in effect (meaning this test can be used) for the duration of the COVID-19 declaration under Se ction 564(b)(1) of the Act, 21 U.S.C. section 360bbb-3(b)(1), unless the authorization is terminated or revoked sooner.  Performed at [facility], [place] [address], [place], [place] [address]   Resp Panel by RT-PCR (Flu A&B, Covid)     Status: None   Collection Time: 12/30/20  9:21 PM   Specimen: Nasopharyngeal(NP) swabs in vial transport medium  Result Value Ref Range Status   SARS Coronavirus 2 by RT PCR NEGATIVE NEGATIVE Final    Comment: (NOTE) SARS-CoV-2 target nucleic acids  are NOT DETECTED.  The SARS-CoV-2 RNA is generally detectable in upper respiratory specimens during the acute phase of infection. The lowest concentration of SARS-CoV-2 viral copies this assay can detect is 138 copies/mL. A negative result does not preclude  SARS-Cov-2 infection and should not be used as the sole basis for treatment or other patient management decisions. A negative result may occur with  improper specimen collection/handling, submission of specimen other than nasopharyngeal swab, presence of viral mutation(s) within the areas targeted by this assay, and inadequate number of viral copies(<138 copies/mL). A negative result must be combined with clinical observations, patient history, and epidemiological information. The expected result is Negative.  Fact Sheet for Patients:  EntrepreneurPulse.com.au  Fact Sheet for Healthcare Providers:  IncredibleEmployment.be  This test is no t yet approved or cleared by the [place] FDA and  has been authorized for detection and/or diagnosis of SARS-CoV-2 by FDA under an Emergency Use Authorization (EUA). This EUA will remain  in effect (meaning this test can be used) for the duration of the COVID-19 declaration under Section 564(b)(1) of the Act, 21 U.S.C.section 360bbb-3(b)(1), unless the authorization is terminated  or revoked sooner.       Influenza A by PCR NEGATIVE NEGATIVE Final   Influenza B by PCR NEGATIVE NEGATIVE Final    Comment: (NOTE) The Xpert Xpress SARS-CoV-2/FLU/RSV plus assay is intended as an aid in the diagnosis of influenza from Nasopharyngeal swab specimens and should not be used as a sole basis for treatment. Nasal washings and aspirates are unacceptable for Xpert Xpress SARS-CoV-2/FLU/RSV testing.  Fact Sheet for Patients: EntrepreneurPulse.com.au  Fact Sheet for Healthcare Providers: IncredibleEmployment.be  This test is not yet approved or cleared by the [place] FDA and has been authorized for detection and/or diagnosis of SARS-CoV-2 by FDA under an Emergency Use Authorization (EUA). This EUA will remain in effect (meaning this test can be used) for the duration of  the COVID-19 declaration under Section 564(b)(1) of the Act, 21 U.S.C. section 360bbb-3(b)(1), unless the authorization is terminated or revoked.  Performed at [facility], [place] [address] [place], [place] [address]   Urine culture     Status: Abnormal   Collection Time: 12/31/20 12:02 AM   Specimen: Urine, Random  Result Value Ref Range Status   Specimen Description URINE, RANDOM  Final   Special Requests   Final    NONE Performed at [facility], [place] [address], [place], [place] [address]    Culture (A)  Final    >=100,000 COLONIES/mL MULTIPLE SPECIES PRESENT, SUGGEST RECOLLECTION   Report Status 01/01/2021 FINAL  Final  Culture, blood (routine x 2)     Status: None   Collection Time: 12/31/20  5:27 AM   Specimen: BLOOD  Result Value Ref Range Status   Specimen Description BLOOD THUMB  Final   Special Requests   Final    BOTTLES DRAWN AEROBIC AND ANAEROBIC Blood Culture results may not be optimal due to an inadequate volume of blood received in culture bottles   Culture   Final    NO GROWTH 5 DAYS Performed at [facility], [place] [address], [place], [place] [address]    Report Status 01/05/2021 FINAL  Final  Culture, blood (routine x 2)     Status: None   Collection Time: 12/31/20 10:05 AM   Specimen: BLOOD RIGHT FOREARM  Result Value Ref Range Status   Specimen Description BLOOD RIGHT FOREARM  Final   Special Requests  Final    BOTTLES DRAWN AEROBIC AND ANAEROBIC Blood Culture adequate volume   Culture   Final    NO GROWTH 5 DAYS Performed at [facility], [place] [address], [place], [place] [address]    Report Status 01/05/2021 FINAL  Final     Labs: Basic Metabolic Panel: Recent Labs  Lab 12/30/20 1930 12/31/20 1005 01/01/21 0846 01/02/21 0703 01/02/21 1201 01/04/21 0017  NA 132* 133* 136 135  --  138  K 3.5 3.3* 3.4* 3.4*  --  4.4  CL 97* 103 103 105  --  107  CO2 22 20* 24 26  --  23  GLUCOSE 134* 154* 94 90  --  92  BUN 14 14  15 12   --  12  CREATININE 1.13* 1.03* 1.10* 1.05*  --  1.01*  CALCIUM 9.0 8.2* 8.4* 8.2*  --  8.5*  MG  --  1.4*  --   --  1.8  --    Liver Function Tests: Recent Labs  Lab 12/30/20 1930 12/31/20 1005 01/01/21 0846 01/02/21 0703 01/04/21 0017  AST 45* 56* 64* 78* 59*  ALT 20 25 37 47* 43  ALKPHOS 65 63 69 88 90  BILITOT 0.9 0.8 0.9 0.4 0.8  PROT 7.6 6.0* 6.4* 5.7* 5.4*  ALBUMIN 3.8 2.8* 2.8* 2.4* 2.3*   Recent Labs  Lab 12/30/20 1930  LIPASE 36   No results for input(s): AMMONIA in the last 168 hours. CBC: Recent Labs  Lab 12/30/20 1930 12/31/20 0806 01/01/21 0846 01/02/21 0703 01/03/21 0601 01/04/21 0017  WBC 18.3* 15.1* 11.4* 8.6 7.6 7.4  NEUTROABS 14.9* 12.3* 8.6* 5.3  --   --   HGB 12.1 11.7* 10.9* 10.2* 9.8* 10.2*  HCT 37.9 38.3 33.9* 31.6* 30.3* 31.1*  MCV 90.9 97.0 91.4 91.1 92.9 92.3  PLT 245 197 226 213 209 248   Cardiac Enzymes: No results for input(s): CKTOTAL, CKMB, CKMBINDEX, TROPONINI in the last 168 hours. BNP: BNP (last 3 results) No results for input(s): BNP in the last 8760 hours.  ProBNP (last 3 results) No results for input(s): PROBNP in the last 8760 hours.  CBG: Recent Labs  Lab 01/04/21 1212 01/04/21 1555 01/04/21 2036 01/04/21 2038 01/05/21 0730  GLUCAP 90 156* 62* 78 75       Signed:  Nita Sells MD   [facility] 01/05/2021, 10:07 AM

## 2021-01-05 NOTE — NC FL2 (Signed)
[place] MEDICAID FL2 LEVEL OF CARE SCREENING TOOL     IDENTIFICATION  Patient Name: Kathryn Wall Birthdate: 1946-03-28 Sex: female Admission Date (Current Location): 12/30/2020  [facility] and [place] Number:  Herbalist and Address:  [facility] [place], [place] [address] [place], [place] [address]      Provider Number: [id number]  Attending Physician Name and Address:  Nita Sells, MD  Relative Name and Phone Number:  Tye [place], daughter, [phone]    Current Level of Care: Hospital Recommended Level of Care: [place] Prior Approval Number:    Date Approved/Denied:   PASRR Number: [id number]  Discharge Plan: SNF    Current Diagnoses: Patient Active Problem List   Diagnosis Date Noted   Generalized weakness 12/31/2020   Fall at home, initial encounter 12/31/2020   Pneumonia of right lower lobe due to infectious organism 12/31/2020   Mixed diabetic hyperlipidemia associated with type 2 diabetes mellitus ([place]) 12/31/2020   GERD without esophagitis 12/31/2020   SIRS (systemic inflammatory response syndrome) ([place]) 12/31/2020   Acute diverticulitis 12/31/2020   Weakness 12/31/2020   Osteopenia of femoral neck, bilateral 10/15/2016   Breast cancer of lower-outer quadrant of right female breast ([place]) 04/02/2014   Chronic kidney disease, stage 3a ([place])    Essential hypertension, benign 08/14/2012   Hypercholesteremia 08/14/2012   Type 2 diabetes mellitus with stage 3a chronic kidney disease, without long-term current use of insulin ([place]) 08/14/2012   Pyelonephritis 05/29/2011   Constipation 05/29/2011   AKI (acute kidney injury) ([place]) 05/29/2011    Orientation RESPIRATION BLADDER Height & Weight     Self, Time, Situation, Place  O2 (Nasal cannula 2L) Incontinent, External catheter Weight: 179 lb 7.3 oz (81.4 kg) Height:  5\' 5"  (165.1 cm)  BEHAVIORAL SYMPTOMS/MOOD NEUROLOGICAL BOWEL NUTRITION STATUS      Incontinent  Diet (See dc summary)  AMBULATORY STATUS COMMUNICATION OF NEEDS Skin   Limited Assist Verbally Normal                       Personal Care Assistance Level of Assistance  Bathing, Feeding, Dressing Bathing Assistance: Limited assistance Feeding assistance: Independent Dressing Assistance: Limited assistance     Functional Limitations Info  Sight Sight Info: Impaired        SPECIAL CARE FACTORS FREQUENCY  PT (By licensed PT), OT (By licensed OT)     PT Frequency: 5x/week OT Frequency: 5x/week            Contractures Contractures Info: Not present    Additional Factors Info  Code Status, Allergies, Psychotropic, Insulin Sliding Scale Code Status Info: Full Allergies Info: Allergen (Antipyrine-benzocaine), Ambien (Zolpidem Tartrate), Cymbalta (Duloxetine Hcl), Other, Ciprofloxacin, Doxazosin, Percocet (Oxycodone-acetaminophen) Psychotropic Info: Paxil Insulin Sliding Scale Info: see dc summary       Current Medications (01/05/2021):  This is the current hospital active medication list Current Facility-Administered Medications  Medication Dose Route Frequency Provider Last Rate Last Admin   acetaminophen (TYLENOL) tablet 650 mg  650 mg Oral Q6H PRN Vernelle Emerald, MD   650 mg at 01/03/21 2227   Or   acetaminophen (TYLENOL) suppository 650 mg  650 mg Rectal Q6H PRN Vernelle Emerald, MD       amLODipine (NORVASC) tablet 5 mg  5 mg Oral Daily Shalhoub, Sherryll Burger, MD   5 mg at 01/04/21 1002   amoxicillin-clavulanate (AUGMENTIN) 875-125 MG per tablet 1 tablet  1 tablet Oral Q12H Pham, Minh Q,  RPH-CPP   1 tablet at 01/04/21 2145   apixaban (ELIQUIS) tablet 5 mg  5 mg Oral BID Nita Sells, MD   5 mg at 01/04/21 2144   atorvastatin (LIPITOR) tablet 40 mg  40 mg Oral QHS Vernelle Emerald, MD   40 mg at 01/04/21 2144   diltiazem (CARDIZEM) injection 10 mg  10 mg Intravenous Once Rise Patience, MD       empagliflozin (JARDIANCE) tablet 25 mg  25 mg Oral  Daily Nita Sells, MD   25 mg at 01/04/21 1002   insulin aspart (novoLOG) injection 0-15 Units  0-15 Units Subcutaneous TID AC & HS Shalhoub, Sherryll Burger, MD   3 Units at 01/04/21 1848   linagliptin (TRADJENTA) tablet 5 mg  5 mg Oral Daily Nita Sells, MD   5 mg at 01/04/21 1002   And   metFORMIN (GLUCOPHAGE) tablet 1,000 mg  1,000 mg Oral BID WC Nita Sells, MD   1,000 mg at 01/04/21 1849   losartan (COZAAR) tablet 50 mg  50 mg Oral Daily Shalhoub, Sherryll Burger, MD   50 mg at 01/04/21 1002   magnesium oxide (MAG-OX) tablet 400 mg  400 mg Oral BID Nita Sells, MD   400 mg at 01/04/21 2144   meloxicam (MOBIC) tablet 7.5 mg  7.5 mg Oral Daily Nita Sells, MD   7.5 mg at 01/04/21 1003   metoprolol succinate (TOPROL-XL) 24 hr tablet 25 mg  25 mg Oral Daily Nita Sells, MD   25 mg at 01/04/21 1003   ondansetron (ZOFRAN) tablet 4 mg  4 mg Oral Q6H PRN Shalhoub, Sherryll Burger, MD       Or   ondansetron [facility]) injection 4 mg  4 mg Intravenous Q6H PRN Shalhoub, Sherryll Burger, MD   4 mg at 01/05/21 0011   pantoprazole (PROTONIX) EC tablet 40 mg  40 mg Oral q morning Shalhoub, Sherryll Burger, MD   40 mg at 01/04/21 1005   PARoxetine (PAXIL) tablet 20 mg  20 mg Oral Daily Shalhoub, Sherryll Burger, MD   20 mg at 01/04/21 1002   QUEtiapine (SEROQUEL) tablet 100 mg  100 mg Oral QHS Nita Sells, MD   100 mg at 01/04/21 2144   sodium chloride (OCEAN) 0.65 % nasal spray 1 spray  1 spray Each Nare PRN Nita Sells, MD   1 spray at 01/03/21 0300     Discharge Medications: Please see discharge summary for a list of discharge medications.  Relevant Imaging Results:  Relevant Lab Results:   Additional Information SSN: [id number]. [place] COVID-19 Vaccine 08/06/2020, 08/08/2019 , 07/18/2019  Benard Halsted, LCSW

## 2021-01-05 NOTE — TOC Initial Note (Addendum)
Transition of Care [facility]) - Initial/Assessment Note    Patient Details  Name: Kathryn Wall MRN: [id number] Date of Birth: 09/23/1945  [facility] of [facility]) CM/SW Contact:    Benard Halsted, LCSW Phone Number: 01/05/2021, 12:00 PM  Clinical Narrative:                 10am-CSW received consult for possible SNF placement at time of discharge. CSW spoke with patient. Patient reported that she lives alone. Patient expressed understanding of PT recommendation and is agreeable to SNF placement at time of discharge. CSW discussed insurance authorization process and provided Medicare SNF ratings list. Patient has received three COVID vaccines. Patient expressed being hopeful for rehab and to feel better soon. She requested CSW speak with her daughter Tye [place] to determine SNF choice.  12pm-CSW spoke with [facility] and provided bed offers. Submitted for insurance authorization pending a facility choice (Ref#  [id number]).  1:48pm-CSW spoke with patient and daughter. They are accepting bed at [facility]. CSW updated insurance. Patient can discharge pending COVID test and insurance approval.   2:40pm-Insurance approval received: Auth ID# [id number], effective 01/05/2021-01/07/2021. Will contact PTAR once COVID results.   Skilled Nursing Rehab Facilities-   [email or web address] *Ratings updated quarterly   Ratings out of 5 possible   Name Address  Phone # Quality Care Staffing Health Inspection Overall  [facility] [address] [place] [address], [place] 5  4 4   [facility]  [address] [place], [place] [phone] 4 2 4 4   [facility] [place] 2 3 1 1   [place], [place] 3 3 4 4   [facility] [address] [place] 3  2 2   [place]. [address] [place] [phone] 2 2 4 4   [place] [address] [place] 5 2 2 3   [facility] [address] [place] 4 2 2 2   [place] [facility] [place] 5 2 2 3   [facility] [phone], [place] [phone] 5 1 2 2   [facility] [address] [facility] [phone] 5 2 2 3   [place] [address] [place] [phone] 3 1 1 1      Expected Discharge Plan: Skilled Nursing Facility Barriers to Discharge: Insurance Authorization, SNF Pending bed offer   Patient Goals and CMS Choice Patient states their goals for this hospitalization and ongoing recovery are:: Rehab CMS Medicare.gov Compare Post Acute Care list provided to:: Patient Choice offered to / list presented to : Patient, Adult Children  Expected Discharge Plan and Services Expected Discharge Plan: [place] In-house Referral: Clinical Social Work   Post Acute Care Choice: [place] Living arrangements for the past 2 months: [place] Expected Discharge Date: 01/05/21                                    Prior Living Arrangements/Services Living arrangements for the past 2 months: Single Family Home Lives with:: Self Patient language and need for interpreter reviewed:: Yes Do you feel safe going back to the place where you live?: Yes      Need for Family Participation in Patient Care: Yes (Comment) Care giver support system in place?: Yes (comment)   Criminal Activity/Legal Involvement Pertinent to Current Situation/Hospitalization: No - Comment as needed  Activities of Daily Living Home Assistive Devices/Equipment: Gilford Rile (specify type) ADL Screening (condition at time of admission) Patient's cognitive ability adequate to safely complete daily activities?: Yes Is the patient deaf or have difficulty hearing?: No Does the patient have difficulty seeing, even when wearing glasses/contacts?: No Does the patient have  difficulty concentrating, remembering, or making decisions?: No Patient able to express need for assistance with ADLs?: Yes Does the patient have difficulty dressing or bathing?: No Independently performs ADLs?: Yes (appropriate for developmental age) Does the patient have difficulty walking or climbing stairs?: No Weakness of Legs: Both Weakness of Arms/Hands: None  Permission Sought/Granted Permission sought to share information with : Facility Sport and exercise psychologist, Family Supports Permission granted to share information with : Yes, Verbal Permission Granted  Share Information with NAME: Tye [place]  Permission granted to share info w AGENCY: SNFs  Permission granted to share info w Relationship: Daughter  Permission granted to share info w Contact Information: [phone]  Emotional Assessment Appearance:: Appears stated age Attitude/Demeanor/Rapport: Engaged Affect (typically observed): Accepting, Appropriate Orientation: : Oriented to Self, Oriented to Place, Oriented to  Time, Oriented to Situation Alcohol / Substance Use: Not Applicable Psych Involvement: No (comment)  Admission diagnosis:  Diverticulitis [K57.92] Weakness [R53.1] Fall [W19.XXXA] PNA (pneumonia) [J18.9] Fever, unspecified fever cause [R50.9] Patient Active Problem List   Diagnosis Date Noted   Generalized weakness 12/31/2020   Fall at home, initial encounter 12/31/2020   Pneumonia of right lower lobe due to infectious organism 12/31/2020   Mixed diabetic hyperlipidemia associated with type 2 diabetes mellitus ([place]) 12/31/2020   GERD without esophagitis 12/31/2020   SIRS (systemic inflammatory response syndrome) ([place]) 12/31/2020   Acute diverticulitis 12/31/2020   Weakness 12/31/2020   Osteopenia of femoral neck, bilateral 10/15/2016   Breast cancer of lower-outer quadrant of right female breast ([place]) 04/02/2014   Chronic kidney disease, stage 3a ([place])    Essential hypertension, benign 08/14/2012    Hypercholesteremia 08/14/2012   Type 2 diabetes mellitus with stage 3a chronic kidney disease, without long-term current use of insulin ([place]) 08/14/2012   Pyelonephritis 05/29/2011   Constipation 05/29/2011   AKI (acute kidney injury) ([place]) 05/29/2011   PCP:  [name], MD Pharmacy:   [place], [place] [address] Phone: [phone] Fax: [phone]     Social Determinants of Health (SDOH) Interventions    Readmission Risk Interventions No flowsheet data found.

## 2021-01-06 ENCOUNTER — Ambulatory Visit: Payer: Medicare Other

## 2021-01-08 ENCOUNTER — Ambulatory Visit: Payer: Medicare Other

## 2021-01-12 ENCOUNTER — Ambulatory Visit: Payer: Medicare Other

## 2021-01-13 ENCOUNTER — Ambulatory Visit: Payer: Medicare Other

## 2021-01-14 ENCOUNTER — Ambulatory Visit: Payer: Medicare Other

## 2021-01-15 ENCOUNTER — Ambulatory Visit: Payer: Medicare Other

## 2021-01-19 ENCOUNTER — Ambulatory Visit: Payer: Medicare Other

## 2021-01-20 ENCOUNTER — Ambulatory Visit: Payer: Medicare Other

## 2021-01-21 ENCOUNTER — Ambulatory Visit: Payer: Medicare Other

## 2021-01-22 ENCOUNTER — Ambulatory Visit: Payer: Medicare Other

## 2021-01-27 ENCOUNTER — Ambulatory Visit: Payer: Medicare Other

## 2021-01-29 ENCOUNTER — Ambulatory Visit: Payer: Medicare Other

## 2021-02-03 ENCOUNTER — Ambulatory Visit: Payer: Medicare Other | Admitting: Family Medicine

## 2021-02-03 ENCOUNTER — Other Ambulatory Visit: Payer: Self-pay

## 2021-02-03 ENCOUNTER — Encounter: Payer: Self-pay | Admitting: Family Medicine

## 2021-02-03 VITALS — BP 138/68 | HR 70 | Temp 97.9°F | Resp 14 | Ht 65.0 in | Wt 165.0 lb

## 2021-02-03 DIAGNOSIS — R413 Other amnesia: Secondary | ICD-10-CM | POA: Diagnosis not present

## 2021-02-03 DIAGNOSIS — E785 Hyperlipidemia, unspecified: Secondary | ICD-10-CM | POA: Diagnosis not present

## 2021-02-03 DIAGNOSIS — J189 Pneumonia, unspecified organism: Secondary | ICD-10-CM

## 2021-02-03 DIAGNOSIS — E1169 Type 2 diabetes mellitus with other specified complication: Secondary | ICD-10-CM | POA: Diagnosis not present

## 2021-02-03 NOTE — Progress Notes (Signed)
Subjective:    Patient ID: Kathryn Wall, female    DOB: [birth year], 75 y.o.   MRN: [id number]  Admit date: 12/30/2020 Discharge date: 01/05/2021   Time spent: 37 minutes   Recommendations for Outpatient Follow-up:  New meds this admission-Toprol-XL, Eliquis--need TOCvisit with Cards Complete Augmentin 7/13 for potential diverticulitis/right-sided pneumonia Needs oxygen 2 liters on d/c Needs Chem-12 CBC 1 week Needs TSH in about 4 weeks Recommend outpatient follow-up Dr. Nancy Nordmann neurology regarding cognitive issues Avoid BZD's continue other medications for anxiety Interval outpatient breast mammograms as per PCP given history of breast cancer Will need outpatient rheumatology follow-up for [+] ANA which will be coordinated by her neurologist Outpatient sleep study to be rescheduled as above   Discharge Diagnoses:  MAIN problem for hospitalization   Please see below for itemized issues addressed in [place]- refer to other progress notes for clarity if needed   Discharge Condition: Fair   Diet recommendation: Heart healthy diabetic       Filed Weights    01/01/21 2000 01/04/21 0352  Weight: 78.9 kg 81.4 kg      History of present illness:  75 year old white female known history HTN, DM TY 2 Breast cancer status post lumpectomy 1997 (XRT )reflux CKD 3 AA Mild cognitive issues followed by Dr. Logan Bores neurological Poor sleep schedule for sleep study currently on Valium for the same-cannot take Ambien because of sleepwalking Recent loss of husband Patient currently getting outpatient therapy at [facility], ED 7/5 weakness X 2 days + mechanical fall in addition to abdominal pain-she initially denied this to this examiner does not tell me she has chills or fevers Has not had cough T-max 100.3 WBC 18.3 abdominal tenderness left lower quadrant noted and also had on CT scan diverticulosis in addition to possible right lower lobe pneumonia   She  developed A. fib with RVR paroxysmal and cardiology was consulted-she converted rapidly on 7/7 to sinus -maintaining sinus on metoprolol Overall is improving but will now need skilled facility placement because of debility with movement and weakness as per PT note 7/10   Hospital Course:  P. Afib-occurred in setting of infection-unclear other precipitants--converted with push of Cardizem alone and remains in sinus rhythm on monitors EF 60-65%, mild LVH Eliquis 5 mg twice daily ,toprol XL--cardiology signed off 7/8 The stabilized during hospital stay and will need outpatient TOC visit with cardiology to be coordinated post skilled visit HTN-well ctrlld Losartan 50, amlodipine 5 mg ?  Diverticulitis Zosyn/azithromycin -->Augmentin 7/9-complete on 7/13 ?  Right-sided pneumonia?  Aspiration Absolutely no cough or cold-2 view CXR 7/7 indeterminant to my overview and looks unchanged--antibiotics as above should cover Discharging on oxygen--hypoxia likely 2/2 to OSA DM TY 2 PCP was contemplating holding Actos 30--held Janumet 1 tab twice daily in addition to Jardiance 25 daily SSI now--CBGs moderately well-controlled during hospitalization CKD 3 AA Hypokalemia replace magnesium with p.o. Mag-Ox 400 twice daily Resolved Labs as outpatient Encephalopathy 01/01/21 Patient has an underlying issue with cognitive deficits-she has not had any stroke-like symptoms and is moving all 4 limbs equally MR ordered 7/8 shows no acute findings and mild microvascular disease--- this is visible to her neurologist forfollow-up Cognitive deficits, recent falls--Work-up performed by Dr. Nancy Nordmann -B12 and folate as well as ANA with reflex however abnormal--OP get B12 shots Will need outpatient initiation of positive pressure ventilation at night to be arranged by her neurologist Longstanding anxiety Valium--> 1 mg at night-continue Paxil 20, Seroquel 100  at night Daughter informs me on 7/8 falls could be 2/2  overmedication with the BZD's Her somnolence resolved well when she was tapered off of BZD    02/03/21 Patient is now off all benzodiazepines.  She is stop Valium.  She has previously stopped Xanax.  She is currently only on Seroquel at night to help her sleep.  She states that she is sleeping well at home despite being off the benzodiazepines which I am very happy to see.  She also seems more alert than I have seen her recently.  I reviewed the chest x-rays from the hospital.  On lateral exam there does appear to be an opacity in the retrocardiac space that was not previously seen on the chest x-ray from March 11.  Therefore I recommended repeating a chest x-ray to ensure resolution.  She was admitted with hypoxia and a fever.  I would like to ensure that there is no residual opacity on chest x-ray.  She is seeing neurology.  At the present time, her memory loss seems stable.  I do believe that she has some mild underlying dementia that is exacerbated by infection or centrally acting medication.  Neurology recommended a sleep study which I think would be an excellent idea as hypoxia could contribute to memory use.  Recent MRI in the hospital revealed chronic microvascular ischemic changes suggesting possible early vascular dementia.  While she was in the hospital, her A1c was found to be 5.5.  She is not checking her sugar at home but if it is this low at home, we may be able to discontinue some of her diabetes medication.  She is receiving physical therapy at home.  Also reviewing her lab work from the hospital showed a B12 level of 144.  She has not been taking her B12 since being discharged from the hospital.  We discussed parenteral B12 versus oral B12 and the patient would like to try oral B12 daily first and then recheck a level in 1 month to see if it is improving.  I explained to both the patient and her daughter that B12 deficiency could contribute to memory loss. Immunization History  Administered  Date(s) Administered   Fluad Quad(high Dose 65+) 05/03/2019   Influenza Split 05/31/2011   Influenza, High Dose Seasonal PF 04/30/2013, 03/17/2017   Influenza,inj,Quad PF,6+ Mos 04/21/2015, 05/25/2016   PFIZER(Purple Top)SARS-COV-2 Vaccination 07/18/2019, 08/08/2019   Pneumococcal Conjugate-13 08/08/2013, 10/18/2013   Pneumococcal Polysaccharide-23 05/31/2011    Past Medical History:  Diagnosis Date   Breast cancer ([place]) 1997   AGE 65, BRCA 1 NEGATIVE 2. 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    CKD (chronic kidney disease), stage III (HCC)    Diabetes mellitus    TYPE II   High cholesterol    Hypertension    Past Surgical History:  Procedure Laterality Date   [place]   RIGHT BREAST LUMPECTOMY   CHOLECYSTECTOMY  1980   Current Outpatient Medications on File Prior to Visit  Medication Sig Dispense Refill   acetaminophen (TYLENOL) 325 MG tablet Take 2 tablets (650 mg total) by mouth every 6 (six) hours as needed for mild pain (or Fever >/= 101).     amLODipine (NORVASC) 5 MG tablet TAKE 1 TABLET BY MOUTH ONCE DAILY 90 tablet 0   apixaban (ELIQUIS) 5 MG TABS tablet Take 1 tablet (5 mg total) by mouth 2 (two) times daily. 60 tablet 0  Ascorbic Acid (VITA-C PO) Take 1 tablet by mouth daily.     atorvastatin (LIPITOR) 40 MG tablet TAKE 1 TABLET BY MOUTH AT BEDTIME 90 tablet 3   cholecalciferol (VITAMIN D3) 25 MCG (1000 UNIT) tablet Take 1,000 Units by mouth daily.     JARDIANCE 25 MG TABS tablet TAKE 1 TABLET BY MOUTH ONCE A DAY 90 tablet 1   losartan (COZAAR) 50 MG tablet TAKE 1 TABLET BY MOUTH ONCE A DAY 90 tablet 3   metoprolol succinate (TOPROL-XL) 25 MG 24 hr tablet Take 1 tablet (25 mg total) by mouth daily.     nystatin ointment (MYCOSTATIN) APPLY TO AFFECTED AREAS TWICE DAILY 30 g 3   pantoprazole (PROTONIX) 40 MG tablet TAKE 1 TABLET BY MOUTH EVERY MORNING 90 tablet 3   PARoxetine (PAXIL) 20 MG tablet Take 1  tablet (20 mg total) by mouth daily. 4 tablet 0   QUEtiapine (SEROQUEL) 50 MG tablet Take 100 mg by mouth at bedtime.     sitaGLIPtin-metformin (JANUMET) 50-1000 MG tablet TAKE 1 TABLET BY MOUTH TWICE A DAY WITH A MEAL 180 tablet 1   No current facility-administered medications on file prior to visit.   Allergies  Allergen Reactions   Allergen [Antipyrine-Benzocaine] Other (See Comments)    Unknown Reaction   Ambien [Zolpidem Tartrate] Other (See Comments)    Can not tolerate, causes sleepwalking   Cymbalta [Duloxetine Hcl] Other (See Comments)    Unknown Reaction   Other     SENSITIVE TO ANTIBIOTICS   Ciprofloxacin Nausea Only    Nausea    Doxazosin Nausea And Vomiting   Percocet [Oxycodone-Acetaminophen] Nausea And Vomiting   Social History   Socioeconomic History   Marital status: Married    Spouse name: Not on file   Number of children: Not on file   Years of education: Not on file   Highest education level: Not on file  Occupational History   Not on file  Tobacco Use   Smoking status: Never   Smokeless tobacco: Never  Substance and Sexual Activity   Alcohol use: No   Drug use: No   Sexual activity: Yes    Birth control/protection: Post-menopausal, Surgical    Comment: HYST  Other Topics Concern   Not on file  Social History Narrative   Not on file   Social Determinants of Health   Financial Resource Strain: Not on file  Food Insecurity: Not on file  Transportation Needs: Not on file  Physical Activity: Not on file  Stress: Not on file  Social Connections: Not on file  Intimate Partner Violence: Not on file   Family History  Problem Relation Age of Onset   Cancer Mother        COLON   Hypertension Father    Heart disease Father       Review of Systems  All other systems reviewed and are negative.     Objective:   Physical Exam Vitals reviewed.  Constitutional:      General: She is not in acute distress.    Appearance: She is  well-developed. She is not diaphoretic.  HENT:     Head: Normocephalic and atraumatic.     Right Ear: External ear normal.     Left Ear: External ear normal.     Nose: Nose normal.     Mouth/Throat:     Pharynx: No oropharyngeal exudate.  Eyes:     General: No scleral icterus.       Right  eye: No discharge.        Left eye: No discharge.     Conjunctiva/sclera: Conjunctivae normal.     Pupils: Pupils are equal, round, and reactive to light.  Neck:     Thyroid: No thyromegaly.     Trachea: No tracheal deviation.  Cardiovascular:     Rate and Rhythm: Normal rate and regular rhythm.     Heart sounds: Normal heart sounds. No murmur heard.   No friction rub. No gallop.  Pulmonary:     Effort: Pulmonary effort is normal. No respiratory distress.     Breath sounds: Normal breath sounds. No stridor. No wheezing or rales.  Abdominal:     General: Bowel sounds are normal. There is no distension.     Palpations: Abdomen is soft. There is no mass.     Tenderness: There is no abdominal tenderness. There is no guarding or rebound.  Musculoskeletal:     Cervical back: Normal range of motion and neck supple.  Lymphadenopathy:     Cervical: No cervical adenopathy.  Skin:    General: Skin is warm.     Coloration: Skin is not pale.     Findings: No erythema or rash.  Neurological:     Mental Status: She is alert and oriented to person, place, and time.     Cranial Nerves: No cranial nerve deficit.     Motor: No abnormal muscle tone.     Coordination: Coordination normal.     Deep Tendon Reflexes: Reflexes normal.  Psychiatric:        Mood and Affect: Mood normal.        Speech: Speech normal.        Behavior: Behavior normal.        Thought Content: Thought content normal.        Judgment: Judgment normal.          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  Therefore I want to minimize any centrally acting medication.  I explained to the patient that I would not take any further benzodiazepines.  Also if she is sleeping well at home we may want to discontinue Seroquel to avoid any sedating medication.  Also recommended that she get on a regular schedule.  Currently she is sleeping late in the day sometimes up to 3:00 in the afternoon and then staying up at night.  I believe that a random sporadic schedule will only exacerbate her underlying memory issues.  Therefore I recommended a daily bedtime and a daily awakening time to try to maintain consistency.  I also recommended following up with a neurologist for a sleep study as cerebral hypoxia at night could be contributing to her memory issues.  I would like her to go get a chest x-ray to ensure resolution of the pneumonia.  I recommended that she take B12 every day for 1 month and then we recheck a B12 level.  If still low at that time I would recommend parenteral B12 due to malabsorption.  Lastly I will check an A1c.  If A1c is still 5.5, we will discontinue Janumet and switch to an extended release metformin 2000 mg  a day

## 2021-02-04 ENCOUNTER — Ambulatory Visit
Admission: RE | Admit: 2021-02-04 | Discharge: 2021-02-04 | Disposition: A | Discharge status: Home / Self Care | Payer: Medicare Other | Source: Ambulatory Visit | Attending: Family Medicine | Admitting: Family Medicine

## 2021-02-04 DIAGNOSIS — J189 Pneumonia, unspecified organism: Secondary | ICD-10-CM

## 2021-02-04 LAB — CBC WITH DIFFERENTIAL/PLATELET
Absolute Monocytes: 462 cells/uL (ref 200–950)
Basophils Absolute: 62 cells/uL (ref 0–200)
Basophils Relative: 0.8 %
Eosinophils Absolute: 262 cells/uL (ref 15–500)
Eosinophils Relative: 3.4 %
HCT: 38.1 % (ref 35.0–45.0)
Hemoglobin: 12.2 g/dL (ref 11.7–15.5)
Lymphs Abs: 2633 cells/uL (ref 850–3900)
MCH: 29.4 pg (ref 27.0–33.0)
MCHC: 32 g/dL (ref 32.0–36.0)
MCV: 91.8 fL (ref 80.0–100.0)
MPV: 13.5 fL — ABNORMAL HIGH (ref 7.5–12.5)
Monocytes Relative: 6 %
Neutro Abs: 4281 cells/uL (ref 1500–7800)
Neutrophils Relative %: 55.6 %
Platelets: 270 10*3/uL (ref 140–400)
RBC: 4.15 10*6/uL (ref 3.80–5.10)
RDW: 14.4 % (ref 11.0–15.0)
Total Lymphocyte: 34.2 %
WBC: 7.7 10*3/uL (ref 3.8–10.8)

## 2021-02-04 LAB — COMPLETE METABOLIC PANEL WITH GFR
AG Ratio: 1.8 (calc) (ref 1.0–2.5)
ALT: 14 U/L (ref 6–29)
AST: 18 U/L (ref 10–35)
Albumin: 4.3 g/dL (ref 3.6–5.1)
Alkaline phosphatase (APISO): 68 U/L (ref 37–153)
BUN/Creatinine Ratio: 14 (calc) (ref 6–22)
BUN: 15 mg/dL (ref 7–25)
CO2: 19 mmol/L — ABNORMAL LOW (ref 20–32)
Calcium: 9.1 mg/dL (ref 8.6–10.4)
Chloride: 106 mmol/L (ref 98–110)
Creat: 1.05 mg/dL — ABNORMAL HIGH (ref 0.60–1.00)
Globulin: 2.4 g/dL (calc) (ref 1.9–3.7)
Glucose, Bld: 84 mg/dL (ref 65–99)
Potassium: 5.1 mmol/L (ref 3.5–5.3)
Sodium: 137 mmol/L (ref 135–146)
Total Bilirubin: 0.4 mg/dL (ref 0.2–1.2)
Total Protein: 6.7 g/dL (ref 6.1–8.1)
eGFR: 55 mL/min/{1.73_m2} — ABNORMAL LOW (ref >=60)

## 2021-02-04 LAB — HEMOGLOBIN A1C
Hgb A1c MFr Bld: 5.3 % of total Hgb (ref <5.7)
Mean Plasma Glucose: 105 mg/dL
eAG (mmol/L): 5.8 mmol/L

## 2021-02-05 ENCOUNTER — Telehealth: Payer: Self-pay | Admitting: *Deleted

## 2021-02-05 NOTE — Telephone Encounter (Signed)
Received call from Robert Lee, [facility] PT with [facility] [phone]~ telephone.   Requested VO for [facility] PT 2x weekly x3 weeks, then 1x weekly x2 weeks, then 1x every other weeks x2 visits for fall prevention and strengthening.   VO given.

## 2021-02-06 ENCOUNTER — Other Ambulatory Visit: Payer: Self-pay | Admitting: *Deleted

## 2021-02-06 MED ORDER — METFORMIN HCL ER 500 MG PO TB24: 2000.0000 mg | ORAL_TABLET | Freq: Every day | ORAL | 0 refills | Status: DC

## 2021-02-18 ENCOUNTER — Other Ambulatory Visit: Payer: Self-pay

## 2021-02-18 ENCOUNTER — Other Ambulatory Visit: Payer: Self-pay | Admitting: Family Medicine

## 2021-02-18 ENCOUNTER — Ambulatory Visit (INDEPENDENT_AMBULATORY_CARE_PROVIDER_SITE_OTHER): Payer: Medicare Other | Admitting: Licensed Clinical Social Worker

## 2021-02-18 DIAGNOSIS — F4323 Adjustment disorder with mixed anxiety and depressed mood: Secondary | ICD-10-CM | POA: Diagnosis not present

## 2021-02-19 NOTE — Progress Notes (Signed)
Comprehensive Clinical Assessment (CCA) Note  02/19/2021 Kathryn Wall [id number]  Chief Complaint: No chief complaint on file.  Visit Diagnosis:  Adjustment disorder with mixed  anxiety and depressed mood   CCA Screening, Triage and Referral (STR) *paperwork completed by patient and/or parent/guardian and scanned in chart  CCA Biopsychosocial Intake/Chief Complaint:  Grief, loneliness, depression, insomnia since loss of husband 08/28/20  Current Symptoms/Problems: depression, insomnia, mood swings, appetite changes,   Patient Reported Schizophrenia/Schizoaffective Diagnosis in Past: No   Strengths: strong family supports  Preferences: outpatient psychiatric supports  Abilities: No data recorded  Type of Services Patient Feels are Needed: counseling (per daughter)   Initial Clinical Notes/Concerns: No data recorded  Mental Health Symptoms Depression:   Difficulty Concentrating; Change in energy/activity; Weight gain/loss; Fatigue; Increase/decrease in appetite; Irritability; Sleep (too much or little); Tearfulness   Duration of Depressive symptoms:  Greater than two weeks   Mania:   None   Anxiety:    Worrying   Psychosis:   None   Duration of Psychotic symptoms: No data recorded  Trauma:   None   Obsessions:   None   Compulsions:   None   Inattention:   None   Hyperactivity/Impulsivity:   None   Oppositional/Defiant Behaviors:   None   Emotional Irregularity:   Mood lability   Other Mood/Personality Symptoms:  No data recorded   Mental Status Exam Appearance and self-care  Stature:   Average   Weight:   Average weight   Clothing:   Neat/clean   Grooming:   Normal   Cosmetic use:   None   Posture/gait:   Normal   Motor activity:   Not Remarkable   Sensorium  Attention:   Normal   Concentration:   Normal   Orientation:   X5   Recall/memory:   Normal   Affect and Mood  Affect:   Depressed   Mood:    Depressed   Relating  Eye contact:   Normal   Facial expression:   Responsive   Attitude toward examiner:   Cooperative   Thought and Language  Speech flow:  Clear and Coherent   Thought content:   Appropriate to Mood and Circumstances   Preoccupation:   None   Hallucinations:   None   Organization:  No data recorded  [employer] of Knowledge:   Good   Intelligence:   Average   Abstraction:   Normal   Judgement:   Good   Reality Testing:   Realistic   Insight:   Good   Decision Making:   Normal   Social Functioning  Social Maturity:   Responsible   Social Judgement:   Normal   Stress  Stressors:   Grief/losses   Coping Ability:   Normal   Skill Deficits:   None   Supports:   Church; Family; Friends/Service system     Religion: Religion/Spirituality Are You A Religious Person?: Yes  Leisure/Recreation: Leisure / Recreation Do You Have Hobbies?: Yes Leisure and Hobbies: watching netflix, reading  Exercise/Diet: Exercise/Diet Do You Exercise?: No Have You Gained or Lost A Significant Amount of Weight in the Past Six Months?: Yes-Lost Do You Follow a Special Diet?: No Do You Have Any Trouble Sleeping?: Yes Explanation of Sleeping Difficulties: insomnia at times   CCA Employment/Education Employment/Work Situation: Employment / Work Situation Employment Situation: Retired Has Patient ever Been in Passenger transport manager?: No  Education: Education Is Patient Currently Attending School?: No Did Teacher, adult education From [employer]  School?:  (UTA) Did You Attend College?:  (UTA) Did You Have An Individualized Education Program (IIEP): No Did You Have Any Difficulty At School?: No Patient's Education Has Been Impacted by Current Illness: No   CCA Family/Childhood History Family and Relationship History: Family history Marital status: Widowed Does patient have children?: Yes How many children?: 2 How is patient's relationship  with their children?: stable relationship with children  Childhood History:  Childhood History By whom was/is the patient raised?: Both parents Additional childhood history information: stable childhood Description of patient's relationship with caregiver when they were a child: stable Patient's description of current relationship with people who raised him/her: parents deceased Does patient have siblings?: Yes Number of Siblings: 1 Description of patient's current relationship with siblings: brother--good relationship with brother Did patient suffer any verbal/emotional/physical/sexual abuse as a child?: No Did patient suffer from severe childhood neglect?: No Has patient ever been sexually abused/assaulted/raped as an adolescent or adult?: No Was the patient ever a victim of a crime or a disaster?: No Witnessed domestic violence?: No Has patient been affected by domestic violence as an adult?: No  Child/Adolescent Assessment:  No issues   CCA Substance Use Alcohol/Drug Use: Alcohol / Drug Use Pain Medications: see MAR Prescriptions: see MAR Over the Counter: see MAR History of alcohol / drug use?: No history of alcohol / drug abuse    ASAM's:  Six Dimensions of Multidimensional Assessment  Dimension 1:  Acute Intoxication and/or Withdrawal Potential:   Dimension 1:  Description of individual's past and current experiences of substance use and withdrawal: none  Dimension 2:  Biomedical Conditions and Complications:      Dimension 3:  Emotional, Behavioral, or Cognitive Conditions and Complications:     Dimension 4:  Readiness to Change:     Dimension 5:  Relapse, Continued use, or Continued Problem Potential:     Dimension 6:  Recovery/Living Environment:     ASAM Severity Score: ASAM's Severity Rating Score: 0  ASAM Recommended Level of Treatment:     Substance use Disorder (SUD)  none  Recommendations for Services/Supports/Treatments: Recommendations for  Services/Supports/Treatments Recommendations For Services/Supports/Treatments: Individual Therapy  DSM5 Diagnoses: Patient Active Problem List   Diagnosis Date Noted   Generalized weakness 12/31/2020   Fall at home, initial encounter 12/31/2020   Pneumonia of right lower lobe due to infectious organism 12/31/2020   Mixed diabetic hyperlipidemia associated with type 2 diabetes mellitus ([place]) 12/31/2020   GERD without esophagitis 12/31/2020   SIRS (systemic inflammatory response syndrome) ([place]) 12/31/2020   Acute diverticulitis 12/31/2020   Weakness 12/31/2020   Osteopenia of femoral neck, bilateral 10/15/2016   Breast cancer of lower-outer quadrant of right female breast ([place]) 04/02/2014   Chronic kidney disease, stage 3a ([place])    Essential hypertension, benign 08/14/2012   Hypercholesteremia 08/14/2012   Type 2 diabetes mellitus with stage 3a chronic kidney disease, without long-term current use of insulin ([place]) 08/14/2012   Pyelonephritis 05/29/2011   Constipation 05/29/2011   AKI (acute kidney injury) ([place]) 05/29/2011    Patient Centered Plan: Patient is on the following Treatment Plan(s):  Anxiety and Depression   Referrals to Alternative Service(s): Referred to Alternative Service(s):   Place:   Date:   Time:    Referred to Alternative Service(s):   Place:   Date:   Time:    Referred to Alternative Service(s):   Place:   Date:   Time:    Referred to Alternative Service(s):   Place:   Date:  Time:     Rachel Bo Vali Capano, LCSW

## 2021-02-26 ENCOUNTER — Telehealth: Payer: Self-pay | Admitting: *Deleted

## 2021-02-26 DIAGNOSIS — S92812A Other fracture of left foot, initial encounter for closed fracture: Secondary | ICD-10-CM

## 2021-02-26 NOTE — Telephone Encounter (Signed)
Received call from [place], [place] with [facility]. [phone]~ telephone.   Reports that patient had fall and is voicing c/o foot pain. States that foot, mainly toe area, is very swollen and tender.   Requested VO for mobile X-ray. VO given.

## 2021-03-05 NOTE — Progress Notes (Deleted)
Office Visit Note  Patient: Kathryn Wall             Date of Birth: 07/19/1945           MRN: [id number]             PCP: [name], MD Referring: [name], MD Visit Date: 03/18/2021 Occupation: _0 @  Subjective:  No chief complaint on file.   History of Present Illness: Kathryn Wall is a 75 y.o. female ***   Activities of Daily Living:  Patient reports morning stiffness for *** {minute/hour:19697}.   Patient {ACTIONS;DENIES/REPORTS:21021675::"Denies"} nocturnal pain.  Difficulty dressing/grooming: {ACTIONS;DENIES/REPORTS:21021675::"Denies"} Difficulty climbing stairs: {ACTIONS;DENIES/REPORTS:21021675::"Denies"} Difficulty getting out of chair: {ACTIONS;DENIES/REPORTS:21021675::"Denies"} Difficulty using hands for taps, buttons, cutlery, and/or writing: {ACTIONS;DENIES/REPORTS:21021675::"Denies"}  No Rheumatology ROS completed.   PMFS History:  Patient Active Problem List   Diagnosis Date Noted   Generalized weakness 12/31/2020   Fall at home, initial encounter 12/31/2020   Pneumonia of right lower lobe due to infectious organism 12/31/2020   Mixed diabetic hyperlipidemia associated with type 2 diabetes mellitus ([place]) 12/31/2020   GERD without esophagitis 12/31/2020   SIRS (systemic inflammatory response syndrome) ([place]) 12/31/2020   Acute diverticulitis 12/31/2020   Weakness 12/31/2020   Osteopenia of femoral neck, bilateral 10/15/2016   Breast cancer of lower-outer quadrant of right female breast ([place]) 04/02/2014   Chronic kidney disease, stage 3a ([place])    Essential hypertension, benign 08/14/2012   Hypercholesteremia 08/14/2012   Type 2 diabetes mellitus with stage 3a chronic kidney disease, without long-term current use of insulin ([place]) 08/14/2012   Pyelonephritis 05/29/2011   Constipation 05/29/2011   AKI (acute kidney injury) ([place]) 05/29/2011    Past Medical History:  Diagnosis Date   Breast cancer ([place]) 1997   AGE 1, BRCA 1  NEGATIVE 2. 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    CKD (chronic kidney disease), stage III (HCC)    Diabetes mellitus    TYPE II   High cholesterol    Hypertension     Family History  Problem Relation Age of Onset   Cancer Mother        COLON   Hypertension Father    Heart disease Father    Past Surgical History:  Procedure Laterality Date   [place]   RIGHT BREAST LUMPECTOMY   CHOLECYSTECTOMY  22   Social History   Social History Narrative   Not on file   Immunization History  Administered Date(s) Administered   Fluad Quad(high Dose 65+) 05/03/2019   Influenza Split 05/31/2011   Influenza, High Dose Seasonal PF 04/30/2013, 03/17/2017   Influenza,inj,Quad PF,6+ Mos 04/21/2015, 05/25/2016   PFIZER(Purple Top)SARS-COV-2 Vaccination 07/18/2019, 08/08/2019   Pneumococcal Conjugate-13 08/08/2013, 10/18/2013   Pneumococcal Polysaccharide-23 05/31/2011     Objective: Vital Signs: There were no vitals taken for this visit.   Physical Exam   Musculoskeletal Exam: ***  CDAI Exam: CDAI Score: -- Patient Global: --; Provider Global: -- Swollen: --; Tender: -- Joint Exam 03/18/2021   No joint exam has been documented for this visit   There is currently no information documented on the homunculus. Go to the Rheumatology activity and complete the homunculus joint exam.  Investigation: No additional findings.  Imaging: DG Chest 2 View  Result Date: 02/05/2021 CLINICAL DATA:  Follow-up pneumonia. EXAM: CHEST - 2 VIEW COMPARISON:  01/01/2021 FINDINGS: Interval clearing of posterior lung base infiltrate. Lungs are now  clear without infiltrate or effusion. Heart size and vascularity normal. IMPRESSION: No active cardiopulmonary disease. Electronically Signed   By: Franchot Gallo M.D.   On: 02/05/2021 09:31    Recent Labs: Lab Results  Component Value Date   WBC 7.7 02/03/2021   HGB 12.2 02/03/2021    PLT 270 02/03/2021   NA 137 02/03/2021   K 5.1 02/03/2021   CL 106 02/03/2021   CO2 19 (L) 02/03/2021   GLUCOSE 84 02/03/2021   BUN 15 02/03/2021   CREATININE 1.05 (H) 02/03/2021   BILITOT 0.4 02/03/2021   ALKPHOS 90 01/04/2021   AST 18 02/03/2021   ALT 14 02/03/2021   PROT 6.7 02/03/2021   ALBUMIN 2.3 (L) 01/04/2021   CALCIUM 9.1 02/03/2021   GFRAA 56 (L) 08/11/2020    Speciality Comments: No specialty comments available.  Procedures:  No procedures performed Allergies: Allergen [antipyrine-benzocaine], Ambien [zolpidem tartrate], Cymbalta [duloxetine hcl], Other, Ciprofloxacin, Doxazosin, and Percocet [oxycodone-acetaminophen]   Assessment / Plan:     Visit Diagnoses: Positive ANA (antinuclear antibody) - 12/16/20: ANA+,dsDNA, RNP-, SM-, Ro 4.7, La 2.4, ESR 9, RPR-, TSH 2.52, Thiamine 126.5, RF<10, folate 10.7, vitamin B12 144  Cognitive complaints  Essential hypertension, benign  GERD without esophagitis  History of diverticulitis  Type 2 diabetes mellitus with stage 3a chronic kidney disease, without long-term current use of insulin (HCC)  Mixed diabetic hyperlipidemia associated with type 2 diabetes mellitus (HCC)  Osteopenia of femoral neck, bilateral  Chronic kidney disease, stage 3a (HCC)  SIRS (systemic inflammatory response syndrome) (HCC)  Malignant neoplasm of lower-outer quadrant of right female breast, unspecified estrogen receptor status ([place])  Orders: No orders of the defined types were placed in this encounter.  No orders of the defined types were placed in this encounter.   Face-to-face time spent with patient was *** minutes. Greater than 50% of time was spent in counseling and coordination of care.  Follow-Up Instructions: No follow-ups on file.   Ofilia Neas, PA-C  Note - This record has been created using Dragon software.  Chart creation errors have been sought, but may not always  have been located. Such creation errors do not  reflect on  the standard of medical care.

## 2021-03-06 ENCOUNTER — Telehealth: Payer: Self-pay | Admitting: *Deleted

## 2021-03-06 ENCOUNTER — Encounter: Payer: Self-pay | Admitting: Family Medicine

## 2021-03-06 NOTE — Telephone Encounter (Signed)
Received call from [place], [place] with [facility] [phone]~ telephone.   Reports that patient requested to change PT visit for today until next Tuesday.   VO given.

## 2021-03-10 ENCOUNTER — Telehealth: Payer: Self-pay | Admitting: Cardiology

## 2021-03-10 NOTE — Telephone Encounter (Signed)
   [facility] Pre-operative Risk Assessment    Patient Name: Kathryn Wall  DOB: 1946-03-21 MRN: [id number]  [facility] STAFF:  - IMPORTANT!!!!!! Under Visit Info/Reason for Call, type in Other and utilize the format Clearance MM/DD/YY or Clearance TBD. Do not use dashes or single digits. - Please review there is not already an duplicate clearance open for this procedure. - If request is for dental extraction, please clarify the # of teeth to be extracted. - If the patient is currently at the dentist's office, call Pre-Op Callback Staff (MA/nurse) to input urgent request.  - If the patient is not currently in the dentist office, please route to the Pre-Op pool.  Request for surgical clearance:  What type of surgery is being performed? Colonoscopy   When is this surgery scheduled? 03/12/2021   What type of clearance is required (medical clearance vs. Pharmacy clearance to hold med vs. Both)? Pharmacy   Are there any medications that need to be held prior to surgery and how long? Eliquis   Practice name and name of physician performing surgery? [facility]   What is the office phone number? [phone]   7.   What is the office fax number? [phone]  8.   Anesthesia type (None, local, MAC, general) ? MAC    Shana A Stovall 03/10/2021, 1:12 PM  _________________________________________________________________   (provider comments below)

## 2021-03-10 NOTE — Telephone Encounter (Signed)
Pt has never been seen by [facility] provider, does have a visit scheduled in 2 months. Was consulted once by Dr Johney Frame in July when admitted - are we able to provide clearance for this? Looks like Eliquis was started during that hospitalization as well.

## 2021-03-10 NOTE — Telephone Encounter (Signed)
She was seen for new onset Afib by Dr. Johney Frame in July, converted to NSR prior to discharge. I'm not sure why she was scheduled so far out. I do not see missed or canceled appts.   Do you want me to have them reach out to the PCP for eliquis hold?

## 2021-03-11 NOTE — Telephone Encounter (Signed)
Call placed to patient.   Patient reports that foot is doing well. 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 States that discolorations and swelling has resolved and pain has greatly reduced.   Please advise.   Also received call from [place], [facility] PT with [place]. [phone]~ telephone.   Advised that impression of X-ray shows nondisplaced fx of 2-5th metatarsal heads. Advised PCP will review imaging report and I will call back with recommendations.

## 2021-03-11 NOTE — Telephone Encounter (Signed)
Received x-ray report from [facility].   Impression: Here are fractures of 2cd, 3rd, 4th, and 5th metatarsal heads noted. No dislocation seen.

## 2021-03-11 NOTE — Telephone Encounter (Signed)
Since [facility] has yet to see this patient, I don't think it's appropriate to make clearance recommendations yet.  I would send over to Dr. Samella Parr office since it looks like she follows with him pretty closely

## 2021-03-11 NOTE — Telephone Encounter (Signed)
Dr. Dennard Schaumann, Please see the communication below with our clinical pharmacists.   Thank you [place], PA-C 03/11/2021, 8:57 AM [place]

## 2021-03-12 ENCOUNTER — Other Ambulatory Visit: Payer: Self-pay | Admitting: Family Medicine

## 2021-03-12 NOTE — Telephone Encounter (Signed)
Call placed to patient. [place].   Call placed to [facility] [place] with [facility]. [phone]~ telephone. Advised to continue wearing foot brace that she has until she is seen by ortho.  Urgent referral placed.

## 2021-03-12 NOTE — Addendum Note (Signed)
Addended by: Sheral Flow on: 03/12/2021 10:43 AM   Modules accepted: Orders

## 2021-03-12 NOTE — Telephone Encounter (Signed)
Spoke with patient's daughter (DPR on File) is aware that per Dr. Dennard Schaumann is okay to hold Eliquis 2 days prior to procedure. Pt's daughter verbalized understand and thanked me for the call.   I will fax over recommendations via Epic to requesting provider's office.

## 2021-03-13 ENCOUNTER — Other Ambulatory Visit: Payer: Self-pay | Admitting: Family Medicine

## 2021-03-13 ENCOUNTER — Telehealth: Payer: Self-pay | Admitting: *Deleted

## 2021-03-13 NOTE — Telephone Encounter (Signed)
Call placed to pharmacy and pharmacy made aware. 

## 2021-03-13 NOTE — Telephone Encounter (Signed)
Received call from [place], pharmacy tech with [employer] [facility].   Reports that patient has medication prepackaged. States that she is needing refill on Actos.   Advised that Actos was D/C'ed after hospitalization in July. Unfortunately, patient was not aware and has continued to take Actos daily.   Please advise.

## 2021-03-17 NOTE — Telephone Encounter (Signed)
   Patient Name: Kathryn Wall  DOB: [birth year] MRN: [id number]  Primary Cardiologist: Dr. Gwyndolyn Kaufman  Chart reviewed as part of pre-operative protocol coverage. See notes as outlined below. Dr. Dennard Schaumann has weighed in on holding anticoagulation and indicated she could hold Eliquis for 2 days prior to procedure. For completeness I also reached out to patient and spoke with daughter (on prior DPR) who affirms no new cardiac symptoms. 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 No cardiac contraindications identified at this time to proceeding with procedure as requested below. Will route this bundled recommendation to requesting provider via Epic fax function. Please call with questions.  Charlie Pitter, PA-C 03/17/2021, 11:54 AM

## 2021-03-18 ENCOUNTER — Ambulatory Visit: Payer: Medicare Other | Admitting: Rheumatology

## 2021-03-18 DIAGNOSIS — K219 Gastro-esophageal reflux disease without esophagitis: Secondary | ICD-10-CM

## 2021-03-18 DIAGNOSIS — R768 Other specified abnormal immunological findings in serum: Secondary | ICD-10-CM

## 2021-03-18 DIAGNOSIS — I10 Essential (primary) hypertension: Secondary | ICD-10-CM

## 2021-03-18 DIAGNOSIS — Z8719 Personal history of other diseases of the digestive system: Secondary | ICD-10-CM

## 2021-03-18 DIAGNOSIS — E1122 Type 2 diabetes mellitus with diabetic chronic kidney disease: Secondary | ICD-10-CM

## 2021-03-18 DIAGNOSIS — R419 Unspecified symptoms and signs involving cognitive functions and awareness: Secondary | ICD-10-CM

## 2021-03-18 DIAGNOSIS — R6510 Systemic inflammatory response syndrome (SIRS) of non-infectious origin without acute organ dysfunction: Secondary | ICD-10-CM

## 2021-03-18 DIAGNOSIS — C50511 Malignant neoplasm of lower-outer quadrant of right female breast: Secondary | ICD-10-CM

## 2021-03-18 DIAGNOSIS — M85851 Other specified disorders of bone density and structure, right thigh: Secondary | ICD-10-CM

## 2021-03-18 DIAGNOSIS — E1169 Type 2 diabetes mellitus with other specified complication: Secondary | ICD-10-CM

## 2021-03-18 DIAGNOSIS — N1831 Chronic kidney disease, stage 3a: Secondary | ICD-10-CM

## 2021-03-19 ENCOUNTER — Telehealth: Payer: Self-pay | Admitting: Family Medicine

## 2021-03-19 NOTE — Telephone Encounter (Signed)
Received voicemail message from [place] with [facility] to follow up on 2 home health care orders originally sent 30-40 days ago; stated reply never received from provider. Orders were resent via fax 9/15. Please advise at [phone], ext [phone].

## 2021-03-20 NOTE — Telephone Encounter (Signed)
Call placed to [place] and requested information on which orders are missing.   [place]

## 2021-03-23 ENCOUNTER — Telehealth: Payer: Self-pay

## 2021-03-23 NOTE — Telephone Encounter (Signed)
The pt lvm in the sleep lab requesting her sleep study for 03/25/21 be cancelled. Pt stated she doesn't want to go through with the study. She declined a call back.

## 2021-03-24 NOTE — Telephone Encounter (Signed)
Called pt and LVM with office number asking for call back.  

## 2021-03-24 NOTE — Telephone Encounter (Signed)
Please call patient to arrange for a follow-up appointment for her memory loss.  I do believe sleep study evaluation would be beneficial in light of her memory loss, and recent hospitalization, during which time she was found to be in atrial fibrillation, which can be associated with underlying obstructive sleep apnea.  I had also referred her to rheumatology, she has not yet been seen, I would recommend she make another appointment with Dr. Estanislado Pandy.

## 2021-03-25 NOTE — Telephone Encounter (Signed)
LMVM for pt or family member to call re: appt with [place] for memory and f/u on referral to rheumatology with Dr. Estanislado Pandy that was recommended.

## 2021-03-26 ENCOUNTER — Encounter: Payer: Self-pay | Admitting: *Deleted

## 2021-03-26 NOTE — Telephone Encounter (Signed)
Letter was sent as was not able to reach pt or family member.

## 2021-03-30 NOTE — Telephone Encounter (Signed)
Received another clearance for the same procedure listed below, as Dr. Michail Sermon would like pt to follow-up with Cardiology before her procedure. Pt has an appointment, 04/29/2021 and clearance will be addressed at that time.  Will route back to [facility] to make them aware.

## 2021-04-02 ENCOUNTER — Other Ambulatory Visit: Payer: Self-pay | Admitting: Family Medicine

## 2021-04-08 ENCOUNTER — Telehealth: Payer: Self-pay | Admitting: *Deleted

## 2021-04-08 NOTE — Telephone Encounter (Signed)
Received call from [place], [facility] PT with [facility], [phone]~ telephone.   Requested VO to extend [facility] PT 1x weekly x4 weeks for strengthening and balance withy metatarsal fx.   VO given.

## 2021-04-09 ENCOUNTER — Ambulatory Visit: Payer: Medicare Other | Admitting: Rheumatology

## 2021-04-17 DIAGNOSIS — I059 Rheumatic mitral valve disease, unspecified: Secondary | ICD-10-CM | POA: Diagnosis not present

## 2021-04-17 DIAGNOSIS — I130 Hypertensive heart and chronic kidney disease with heart failure and stage 1 through stage 4 chronic kidney disease, or unspecified chronic kidney disease: Secondary | ICD-10-CM | POA: Diagnosis not present

## 2021-04-17 DIAGNOSIS — E1122 Type 2 diabetes mellitus with diabetic chronic kidney disease: Secondary | ICD-10-CM | POA: Diagnosis not present

## 2021-04-17 DIAGNOSIS — G3184 Mild cognitive impairment, so stated: Secondary | ICD-10-CM | POA: Diagnosis not present

## 2021-04-29 ENCOUNTER — Encounter: Payer: Self-pay | Admitting: Physician Assistant

## 2021-04-29 ENCOUNTER — Other Ambulatory Visit: Payer: Self-pay

## 2021-04-29 ENCOUNTER — Ambulatory Visit: Payer: Medicare Other | Admitting: Physician Assistant

## 2021-04-29 VITALS — BP 124/80 | HR 70 | Ht 65.0 in | Wt 172.0 lb

## 2021-04-29 DIAGNOSIS — Z01810 Encounter for preprocedural cardiovascular examination: Secondary | ICD-10-CM | POA: Diagnosis not present

## 2021-04-29 DIAGNOSIS — I10 Essential (primary) hypertension: Secondary | ICD-10-CM

## 2021-04-29 DIAGNOSIS — I480 Paroxysmal atrial fibrillation: Secondary | ICD-10-CM | POA: Diagnosis not present

## 2021-04-29 DIAGNOSIS — E785 Hyperlipidemia, unspecified: Secondary | ICD-10-CM | POA: Diagnosis not present

## 2021-04-29 DIAGNOSIS — N1831 Chronic kidney disease, stage 3a: Secondary | ICD-10-CM

## 2021-04-29 NOTE — Telephone Encounter (Signed)
Patient seen in clinic, given clearance for colonoscopy. Will route to pharm for input on whether 2 days Eliquis is adequate holding. Please route back to me personally (not preop pool) so I can addend note and finalize. Thanks.

## 2021-04-29 NOTE — Progress Notes (Addendum)
Cardiology Office Note    Date:  04/29/2021   ID:  Kathryn Wall, DOB June 19, 1946, MRN [id number]  PCP:  [name], MD  Cardiologist:  Freada Bergeron, MD  Electrophysiologist:  None   Chief Complaint: f/u atrial fib  History of Present Illness:   Kathryn Wall is a 75 y.o. female with history of paroxysmal atrial fibrillation, hypertension, hyperlipidemia (followed by primary care), GERD, type 2 diabetes, CKD stage III, cognitive deficits, remote history of breast cancer S/P lumpectomy and radiation, anxiety who presents for follow-up. She previously had a nuc in 2010 that was normal. She was recently admitted 12/2020 with multiple complaints (nausea, weakness, falls, chills, fever, diarrhea) found to have possible diverticulitis, aspiration PNA and encephalopathy. She was also seen by cardiology team for new onset AF briefly during her admission. This was felt due to acute infection +/- OSA. She was started on metoprolol and Eliquis. 2D echo showed EF 60-65%, mild LVH, mild-mod aortic sclerosis without stenosis. Outpatient sleep study was recommended but she has elected not to pursue this.  She is seen for follow-up today with her granddaughter doing well. She has not had any recent palpitations, CP, SOB, bleeding or syncope. She has had 2 mechanical falls in the last several months and has been working with PT. She walks with a cane and occasionally a walker. She is very lucid and able to participate in the conversation meaningfully without any obvious neurologic deficits. At the time of her afib in hospital she felt symptoms of a panic attack but has not had any recurrence of these symptoms. She is awaiting clearance for colonoscopy which was originally planned in September but rescheduled to January as she was not yet seen back in our clinic for f/u at time of request.  Labwork independently reviewed: 01/2021 A1C 5.3, K 5.1, Cr 1.05, LFTs ok, CBC OK 12/2020 TSH OK 07/2020 LDL  70   Past Medical History:  Diagnosis Date   Anxiety    Breast cancer ([place]) 06/29/1995   AGE 4, BRCA 1 NEGATIVE 2. 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    CKD (chronic kidney disease), stage III (HCC)    Cognitive deficits    Diabetes mellitus    TYPE II   High cholesterol    Hypertension    PAF (paroxysmal atrial fibrillation) ([place])     Past Surgical History:  Procedure Laterality Date   [place]   RIGHT BREAST LUMPECTOMY   CHOLECYSTECTOMY  1980    Current Medications: Current Meds  Medication Sig   acetaminophen (TYLENOL) 325 MG tablet Take 2 tablets (650 mg total) by mouth every 6 (six) hours as needed for mild pain (or Fever >/= 101).   amLODipine (NORVASC) 5 MG tablet TAKE 1 TABLET BY MOUTH ONCE A DAY   Ascorbic Acid (VITA-C PO) Take 1 tablet by mouth daily.   atorvastatin (LIPITOR) 40 MG tablet TAKE 1 TABLET BY MOUTH AT BEDTIME   cholecalciferol (VITAMIN D3) 25 MCG (1000 UNIT) tablet Take 1,000 Units by mouth daily.   ELIQUIS 5 MG TABS tablet TAKE 1 TABLET BY MOUTH TWICE A DAY   JARDIANCE 25 MG TABS tablet TAKE 1 TABLET BY MOUTH ONCE A DAY   losartan (COZAAR) 50 MG tablet TAKE 1 TABLET BY MOUTH ONCE A DAY   meloxicam (MOBIC) 7.5 MG tablet TAKE 1 TABLET BY MOUTH ONCE A DAY   metFORMIN (GLUCOPHAGE  XR) 500 MG 24 hr tablet Take 4 tablets (2,000 mg total) by mouth daily with breakfast.   metoprolol succinate (TOPROL-XL) 25 MG 24 hr tablet TAKE 1 TABLET BY MOUTH ONCE A DAY   nystatin ointment (MYCOSTATIN) APPLY TO AFFECTED AREAS TWICE DAILY   pantoprazole (PROTONIX) 40 MG tablet TAKE 1 TABLET BY MOUTH EVERY MORNING   PARoxetine (PAXIL) 20 MG tablet Take 1 tablet (20 mg total) by mouth daily.   QUEtiapine (SEROQUEL) 50 MG tablet Take 100 mg by mouth at bedtime.     Allergies:   Allergen [antipyrine-benzocaine], Ambien [zolpidem tartrate], Cymbalta [duloxetine hcl], Other, Ciprofloxacin, Doxazosin, and  Percocet [oxycodone-acetaminophen]   Social History   Socioeconomic History   Marital status: Married    Spouse name: Not on file   Number of children: Not on file   Years of education: Not on file   Highest education level: Not on file  Occupational History   Not on file  Tobacco Use   Smoking status: Never   Smokeless tobacco: Never  Substance and Sexual Activity   Alcohol use: No   Drug use: No   Sexual activity: Yes    Birth control/protection: Post-menopausal, Surgical    Comment: HYST  Other Topics Concern   Not on file  Social History Narrative   Not on file   Social Determinants of Health   Financial Resource Strain: Not on file  Food Insecurity: Not on file  Transportation Needs: Not on file  Physical Activity: Not on file  Stress: Not on file  Social Connections: Not on file     Family History:  The patient's family history includes Cancer in her mother; Heart disease in her father; Hypertension in her father.  ROS:   Please see the history of present illness.  All other systems are reviewed and otherwise negative.    EKGs/Labs/Other Studies Reviewed:    Studies reviewed are outlined and summarized above. Reports included below if pertinent.  2D Echo 12/2020  1. Left ventricular ejection fraction, by estimation, is 60 to 65%. The  left ventricle has normal function. The left ventricle has no regional  wall motion abnormalities. There is mild left ventricular hypertrophy.  Left ventricular diastolic parameters  were normal.   2. Right ventricular systolic function is normal. The right ventricular  size is normal. There is normal pulmonary artery systolic pressure. The  estimated right ventricular systolic pressure is 82.4 mmHg.   3. The mitral valve is normal in structure. Trivial mitral valve  regurgitation. No evidence of mitral stenosis.   4. The aortic valve is tricuspid. Aortic valve regurgitation is not  visualized. Mild to moderate aortic  valve sclerosis/calcification is  present, without any evidence of aortic stenosis.   5. The inferior vena cava is dilated in size with >50% respiratory  variability, suggesting right atrial pressure of 8 mmHg.   2010 NST normal see scan    EKG:  EKG is ordered today, personally reviewed, demonstrating NSR 70bpm, RSR pattern V1 similar to prior, nonspecific STTW changes   Recent Labs: 01/01/2021: TSH 1.287 01/02/2021: Magnesium 1.8 02/03/2021: ALT 14; BUN 15; Creat 1.05; Hemoglobin 12.2; Platelets 270; Potassium 5.1; Sodium 137  Recent Lipid Panel    Component Value Date/Time   CHOL 148 08/11/2020 1541   TRIG 340 (H) 08/11/2020 1541   HDL 37 (L) 08/11/2020 1541   CHOLHDL 4.0 08/11/2020 1541   VLDL 44 (H) 10/22/2016 1232   LDLCALC 70 08/11/2020 1541   LDLDIRECT 62 11/22/2019  1526    PHYSICAL EXAM:    VS:  BP 124/80   Pulse 70   Ht _0  (1.651 m)   Wt 172 lb (78 kg)   SpO2 100%   BMI 28.62 kg/m   BMI: Body mass index is 28.62 kg/m.  GEN: Well nourished, well developed female in no acute distress HEENT: normocephalic, atraumatic Neck: no JVD, carotid bruits, or masses Cardiac: RRR; no murmurs, rubs, or gallops, no edema  Respiratory:  clear to auscultation bilaterally, normal work of breathing GI: soft, nontender, nondistended, + BS MS: no deformity or atrophy Skin: warm and dry, no rash Neuro:  Alert and Oriented x 3, Strength and sensation are intact, follows commands Psych: euthymic mood, full affect  Wt Readings from Last 3 Encounters:  04/29/21 172 lb (78 kg)  02/03/21 165 lb (74.8 kg)  01/04/21 179 lb 7.3 oz (81.4 kg)     ASSESSMENT & PLAN:   1. Paroxysmal atrial fibrillation - maintaining NSR. This was diagnosed in the context of multiple metabolic stressors. She has not had any recurrence of symptoms since that time. Patient and family are concerned about fall risk/anticoagulation. She has fallen 2 times in the last few months. No severe admissions for falls.  Will undertake a 2 week Zio XT to evaluate for occult AF. If this is negative for recurrent arrhythmia will discuss discontinuation of anticoagulation with primary cardiologist, with watchful waiting measures at home suggested like HR monitoring and Apple watch. If this does show recurrences of PAF, would continue Eliquis cautiously with continued follow-up to ensure fall risk does not progress further. CHADSVASC score is 5 for HTN, DM, age, female and Eliquis dose remains appropriately. Thinking tentatively ahead, if she were to have recurrent atrial fib and were deemed a poor anticoagulation candidate in the future, consideration could be given to referral for EP for Watchman. She does not want to pursue sleep study at this time. She will let us know if she changes her mind.  2. Essential HTN - BP controlled, no changes made today.  3. CKD stage IIIa - recent creatinine reviewed from 01/2019, relatively stable from prior values. Will need to continue to follow periodically with labwork.  4. Pre-procedure clearance for colonoscopy - The patient affirms she has been doing well without any new cardiac symptoms. Therefore, based on ACC/AHA guidelines, the patient would be at acceptable risk for the planned procedure without further cardiovascular testing. The patient was advised that if she develops new symptoms prior to surgery to contact our office to arrange for a follow-up visit, and she verbalized understanding. I routed a request to our pharmacy team to confirm optimal timeframe of holding Eliquis prior to procedure (primary care had granted 2 days, just want to confirm this is enough). Once this is known will addend this office note and route to requesting provider. ADDENDUM: per d/w pharmacy team, per office protocol, patient can hold Eliquis for 2 days prior to procedure (if she is still on Eliquis at that time - as above, we are investigating for presence or absence of recurrent atrial fib). I will  route this note to requesting provider via Epic fax function.     Disposition: F/u with Dr. Johney Frame in 4 months for follow-up.   Medication Adjustments/Labs and Tests Ordered: Current medicines are reviewed at length with the patient today.  Concerns regarding medicines are outlined above. Medication changes, Labs and Tests ordered today are summarized above and listed in the Patient Instructions accessible in  Encounters.   Signed, Charlie Pitter, PA-C  04/29/2021 [facility] [place], [place],   [address] Phone: [phone]; Fax: [phone]

## 2021-04-29 NOTE — Patient Instructions (Signed)
Medication Instructions:  Your physician recommends that you continue on your current medications as directed. Please refer to the Current Medication list given to you today.  *If you need a refill on your cardiac medications before your next appointment, please call your pharmacy*   Lab Work: None ordered  If you have labs (blood work) drawn today and your tests are completely normal, you will receive your results only by: [place] (if you have MyChart) OR A paper copy in the mail If you have any lab test that is abnormal or we need to change your treatment, we will call you to review the results.   Testing/Procedures: [place] [place]- Long Term Monitor Instructions  Your physician has requested you wear a ZIO patch monitor for 14 days.  This is a single patch monitor. [employer] supplies one patch monitor per enrollment. Additional stickers are not available. Please do not apply patch if you will be having a Nuclear Stress Test,  Echocardiogram, Cardiac CT, MRI, or Chest Xray during the period you would be wearing the  monitor. The patch cannot be worn during these tests. You cannot remove and re-apply the  ZIO XT patch monitor.  Your ZIO patch monitor will be mailed 3 day USPS to your address on file. It may take 3-5 days  to receive your monitor after you have been enrolled.  Once you have received your monitor, please review the enclosed instructions. Your monitor  has already been registered assigning a specific monitor serial # to you.  Billing and Patient Assistance Program Information  We have supplied [employer] with any of your insurance information on file for billing purposes. [employer] offers a sliding scale Patient Assistance Program for patients that do not have  insurance, or whose insurance does not completely cover the cost of the ZIO monitor.  You must apply for the Patient Assistance Program to qualify for this discounted rate.  To apply, please call [employer] at  [phone], select option 4, select option 2, ask to apply for  Patient Assistance Program. Theodore Demark will ask your household income, and how many people  are in your household. They will quote your out-of-pocket cost based on that information.  [employer] will also be able to set up a 48-month interest-free payment plan if needed.  Applying the monitor   Shave hair from upper left chest.  Hold abrader disc by orange tab. Rub abrader in 40 strokes over the upper left chest as  indicated in your monitor instructions.  Clean area with 4 enclosed alcohol pads. Let dry.  Apply patch as indicated in monitor instructions. Patch will be placed under collarbone on left  side of chest with arrow pointing upward.  Rub patch adhesive wings for 2 minutes. Remove white label marked "1". Remove the white  label marked "2". Rub patch adhesive wings for 2 additional minutes.  While looking in a mirror, press and release button in center of patch. A small green light will  flash 3-4 times. This will be your only indicator that the monitor has been turned on.  Do not shower for the first 24 hours. You may shower after the first 24 hours.  Press the button if you feel a symptom. You will hear a small click. Record Date, Time and  Symptom in the Patient Logbook.  When you are ready to remove the patch, follow instructions on the last 2 pages of Patient  Logbook. Stick patch monitor onto the last page of Patient Logbook.  Place Patient  Logbook in the blue and white box. Use locking tab on box and tape box closed  securely. The blue and white box has prepaid postage on it. Please place it in the mailbox as  soon as possible. Your physician should have your test results approximately 7 days after the  monitor has been mailed back to [facility].  Call [place] at [phone] if you have questions regarding  your ZIO XT patch monitor. Call them immediately if you see an orange light  blinking on your  monitor.  If your monitor falls off in less than 4 days, contact our Monitor department at [phone].  If your monitor becomes loose or falls off after 4 days call [employer] at [phone] for  suggestions on securing your monitor    Follow-Up: At [facility], you and your health needs are our priority.  As part of our continuing mission to provide you with exceptional heart care, we have created designated Provider Care Teams.  These Care Teams include your primary Cardiologist (physician) and Advanced Practice Providers (APPs -  Physician Assistants and Nurse Practitioners) who all work together to provide you with the care you need, when you need it.  We recommend signing up for the patient portal called "MyChart".  Sign up information is provided on this After Visit Summary.  MyChart is used to connect with patients for Virtual Visits (Telemedicine).  Patients are able to view lab/test results, encounter notes, upcoming appointments, etc.  Non-urgent messages can be sent to your provider as well.   To learn more about what you can do with MyChart, go to [email or web address].    Your next appointment:   4 month(s)  The format for your next appointment:   In Person  Provider:   Gwyndolyn Kaufman, MD   Other Instructions

## 2021-04-30 NOTE — Telephone Encounter (Signed)
Patient with diagnosis of PAF on eliquis for anticoagulation.    Procedure: colonoscopy Date of procedure: ?   CHA2DS2-VASc Score = 5  This indicates a 7.2% annual risk of stroke. The patient's score is based upon: CHF History: 0 HTN History: 1 Diabetes History: 1 Stroke History: 0 Vascular Disease History: 0 Age Score: 2 Gender Score: 1    CrCl 53 mL/min Platelet count 270K   Per office protocol, patient can hold Eliquis for 2 days prior to procedure.

## 2021-05-01 NOTE — Telephone Encounter (Addendum)
Addended my office note and routed to requesting team via EHR fax function.

## 2021-05-02 ENCOUNTER — Other Ambulatory Visit: Payer: Self-pay | Admitting: Family Medicine

## 2021-05-11 ENCOUNTER — Other Ambulatory Visit: Payer: Self-pay | Admitting: Family Medicine

## 2021-05-19 ENCOUNTER — Ambulatory Visit (INDEPENDENT_AMBULATORY_CARE_PROVIDER_SITE_OTHER): Payer: Medicare Other

## 2021-05-19 ENCOUNTER — Telehealth: Payer: Self-pay | Admitting: Physician Assistant

## 2021-05-19 DIAGNOSIS — I480 Paroxysmal atrial fibrillation: Secondary | ICD-10-CM

## 2021-05-19 NOTE — Progress Notes (Unsigned)
Enrolled for [employer] to mail a ZIO XT long term holter monitor to the patients address on file.  Requested mailing to be expedited.

## 2021-05-19 NOTE — Telephone Encounter (Signed)
Apologized to Ms. [place] regarding the delay in her receiving the ZIO XT monitor.  Her order was placed to be done at [facility], which, does not do outpatient mailed monitors. I will ask [employer] to expedite mailing.  Also let patient and her daughter Ms. Shreve know, she is not listed on her DPR.  Next time they come in , they should update that so we could speak with her daughter directly.

## 2021-05-19 NOTE — Telephone Encounter (Signed)
Follow Up:    Daughter is calling to see what the status of patient's Monitor.

## 2021-05-24 DIAGNOSIS — I480 Paroxysmal atrial fibrillation: Secondary | ICD-10-CM | POA: Diagnosis not present

## 2021-07-01 ENCOUNTER — Telehealth: Payer: Self-pay | Admitting: Physician Assistant

## 2021-07-01 ENCOUNTER — Other Ambulatory Visit: Payer: Self-pay | Admitting: Family Medicine

## 2021-07-01 NOTE — Telephone Encounter (Signed)
Returned call to pt, left a message for her to call back. 

## 2021-07-01 NOTE — Telephone Encounter (Signed)
°  Pt is returning  call to get heart monitor results

## 2021-07-08 ENCOUNTER — Other Ambulatory Visit: Payer: Self-pay | Admitting: Family Medicine

## 2021-07-08 NOTE — Telephone Encounter (Signed)
Call placed to pt, left a message for pt to call back.  

## 2021-07-14 NOTE — Telephone Encounter (Signed)
Pt and daughter have been made aware of monitor results. See result note.

## 2021-08-05 ENCOUNTER — Other Ambulatory Visit: Payer: Self-pay | Admitting: Family Medicine

## 2021-08-08 ENCOUNTER — Other Ambulatory Visit: Payer: Self-pay | Admitting: Family Medicine

## 2021-08-12 ENCOUNTER — Telehealth: Payer: Self-pay | Admitting: Family Medicine

## 2021-08-12 NOTE — Telephone Encounter (Signed)
Left message for patient to call back and schedule Medicare Annual Wellness Visit (AWV) in office.   If not able to come in office, please offer to do virtually or by telephone.  Left office number and my jabber [phone].  Last AWV:08/11/2020  Please schedule at anytime with Nurse Health Advisor.

## 2021-09-08 ENCOUNTER — Other Ambulatory Visit: Payer: Self-pay | Admitting: Orthopaedic Surgery

## 2021-09-08 ENCOUNTER — Ambulatory Visit
Admission: RE | Admit: 2021-09-08 | Discharge: 2021-09-08 | Disposition: A | Discharge status: Home / Self Care | Payer: Medicare Other | Source: Ambulatory Visit | Attending: Orthopaedic Surgery | Admitting: Orthopaedic Surgery

## 2021-09-08 DIAGNOSIS — M79672 Pain in left foot: Secondary | ICD-10-CM

## 2021-09-14 ENCOUNTER — Telehealth: Payer: Self-pay | Admitting: Cardiology

## 2021-09-14 ENCOUNTER — Telehealth: Payer: Self-pay | Admitting: Family Medicine

## 2021-09-14 NOTE — Telephone Encounter (Signed)
Left message for patient's daughter, Kathryn Wall,   to call back and schedule Medicare Annual Wellness Visit (AWV) in office.  ? ?If not able to come in office, please offer to do virtually or by telephone.  Left office number and my jabber [phone]. ? ?Last AWV:08/11/2020 ? ?Please schedule at anytime with Nurse Health Advisor. ?  ?

## 2021-09-14 NOTE — Telephone Encounter (Signed)
Patient with diagnosis of A Fib on Eliquis for anticoagulation.   ? ?Procedure:  Fracture of her mid foot ?Date of procedure: 09/17/21 ? ? ?CHA2DS2-VASc Score = 5  ?This indicates a 7.2% annual risk of stroke. ?The patient's score is based upon: ?CHF History: 0 ?HTN History: 1 ?Diabetes History: 1 ?Stroke History: 0 ?Vascular Disease History: 0 ?Age Score: 2 ?Gender Score: 1 ? ? ?CrCl 55 mL/min ?Platelet count 270K ? ?Per office protocol, patient can hold Eliquis for 2-3 days prior to procedure.   ?

## 2021-09-14 NOTE — Telephone Encounter (Signed)
? ?  Pre-operative Risk Assessment  ?  ?Patient Name: Kathryn Wall  ?DOB: 1946/02/27 ?MRN: [id number]  ? ?  ? ?Request for Surgical Clearance   ? ?Procedure:   Fracture of her mid foot ? ?Date of Surgery:  Clearance 09/17/21                              ?   ?Surgeon:  Dr. Tomie China ?Surgeon's Group or Practice Name:  [facility] ?Phone number:  [phone] ?Fax number:  [phone] ?  ?Type of Clearance Requested:   ?- Medical  Eliquis  ?  ?Type of Anesthesia:  General  ?  ?Additional requests/questions:   office states they need a answer today ? ?Signed, ?Milbert Coulter   ?09/14/2021, 3:27 PM  ? ?

## 2021-09-14 NOTE — Progress Notes (Deleted)
?Cardiology Office Note:   ? ?Date:  09/14/2021  ? ?ID:  TERETHA CHALUPA, DOB 10-May-1946, MRN [id number] ? ?PCP:  [name], MD ?  ?[facility] Providers ?Cardiologist:  Freada Bergeron, MD { ? ? ?Referring MD: [name], MD  ? ? ? ?History of Present Illness:   ? ?Kathryn Wall is a 76 y.o. female with a history of paroxysmal atrial fibrillation, hypertension, hyperlipidemia (followed by primary care), GERD, type 2 diabetes, CKD stage III, cognitive deficits, remote history of breast cancer S/P lumpectomy and radiation, anxiety who presents for follow-up.  ? ?Per review of the record, she previously had a nuc in 2010 that was normal. She was recently admitted 12/2020 with multiple complaints (nausea, weakness, falls, chills, fever, diarrhea) found to have possible diverticulitis, aspiration PNA and encephalopathy. She was also seen by cardiology team for new onset AF briefly during her admission. This was felt due to acute infection +/- OSA. She was started on metoprolol and Eliquis. 2D echo showed EF 60-65%, mild LVH, mild-mod aortic sclerosis without stenosis. Outpatient sleep study was recommended but she has elected not to pursue this. ? ?Was last seen in clinic by Melina Copa 04/2021 where she was doing well. No chest pain or palpitations.  ? ?Past Medical History:  ?Diagnosis Date  ? Anxiety   ? Breast cancer ([place]) 06/29/1995  ? AGE 58, BRCA 1 NEGATIVE 2. 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   ? CKD (chronic kidney disease), stage III ([place])   ? Cognitive deficits   ? Diabetes mellitus   ? TYPE II  ? High cholesterol   ? Hypertension   ? PAF (paroxysmal atrial fibrillation) ([place])   ? ? ?Past Surgical History:  ?Procedure Laterality Date  ? ABDOMINAL HYSTERECTOMY  1985  ? TAH  ? BREAST SURGERY  1997  ? RIGHT BREAST LUMPECTOMY  ? CHOLECYSTECTOMY  1980  ? ? ?Current Medications: ?No outpatient medications have been marked as taking for the 09/16/21 encounter (Appointment)  with Freada Bergeron, MD.  ?  ? ?Allergies:   Allergen [antipyrine-benzocaine], Ambien [zolpidem tartrate], Cymbalta [duloxetine hcl], Other, Ciprofloxacin, Doxazosin, and Percocet [oxycodone-acetaminophen]  ? ?Social History  ? ?Socioeconomic History  ? Marital status: Married  ?  Spouse name: Not on file  ? Number of children: Not on file  ? Years of education: Not on file  ? Highest education level: Not on file  ?Occupational History  ? Not on file  ?Tobacco Use  ? Smoking status: Never  ? Smokeless tobacco: Never  ?Substance and Sexual Activity  ? Alcohol use: No  ? Drug use: No  ? Sexual activity: Yes  ?  Birth control/protection: Post-menopausal, Surgical  ?  Comment: HYST  ?Other Topics Concern  ? Not on file  ?Social History Narrative  ? Not on file  ? ?Social Determinants of Health  ? ?Financial Resource Strain: Not on file  ?Food Insecurity: Not on file  ?Transportation Needs: Not on file  ?Physical Activity: Not on file  ?Stress: Not on file  ?Social Connections: Not on file  ?  ? ?Family History: ?The patient's ***family history includes Cancer in her mother; Heart disease in her father; Hypertension in her father. ? ?ROS:   ?Please see the history of present illness.    ?*** All other systems reviewed and are negative. ? ?EKGs/Labs/Other Studies Reviewed:   ? ?The following studies were reviewed today: ?2D Echo 12/2020 ? 1. Left ventricular ejection  fraction, by estimation, is 60 to 65%. The  ?left ventricle has normal function. The left ventricle has no regional  ?wall motion abnormalities. There is mild left ventricular hypertrophy.  ?Left ventricular diastolic parameters  ?were normal.  ? 2. Right ventricular systolic function is normal. The right ventricular  ?size is normal. There is normal pulmonary artery systolic pressure. The  ?estimated right ventricular systolic pressure is 40.1 mmHg.  ? 3. The mitral valve is normal in structure. Trivial mitral valve  ?regurgitation. No evidence of  mitral stenosis.  ? 4. The aortic valve is tricuspid. Aortic valve regurgitation is not  ?visualized. Mild to moderate aortic valve sclerosis/calcification is  ?present, without any evidence of aortic stenosis.  ? 5. The inferior vena cava is dilated in size with >50% respiratory  ?variability, suggesting right atrial pressure of 8 mmHg.  ?  ?2010 NST normal scan ? ?EKG:  EKG is *** ordered today.  The ekg ordered today demonstrates *** ? ?Recent Labs: ?01/01/2021: TSH 1.287 ?01/02/2021: Magnesium 1.8 ?02/03/2021: ALT 14; BUN 15; Creat 1.05; Hemoglobin 12.2; Platelets 270; Potassium 5.1; Sodium 137  ?Recent Lipid Panel ?   ?Component Value Date/Time  ? CHOL 148 08/11/2020 1541  ? TRIG 340 (H) 08/11/2020 1541  ? HDL 37 (L) 08/11/2020 1541  ? CHOLHDL 4.0 08/11/2020 1541  ? VLDL 44 (H) 10/22/2016 1232  ? [place] 70 08/11/2020 1541  ? LDLDIRECT 62 11/22/2019 1526  ? ? ? ?Risk Assessment/Calculations:   ?{Does this patient have ATRIAL FIBRILLATION?[phone]} ? ?    ? ?Physical Exam:   ? ?VS:  There were no vitals taken for this visit.   ? ?Wt Readings from Last 3 Encounters:  ?04/29/21 172 lb (78 kg)  ?02/03/21 165 lb (74.8 kg)  ?01/04/21 179 lb 7.3 oz (81.4 kg)  ?  ? ?GEN: *** Well nourished, well developed in no acute distress ?HEENT: Normal ?NECK: No JVD; No carotid bruits ?LYMPHATICS: No lymphadenopathy ?CARDIAC: ***RRR, no murmurs, rubs, gallops ?RESPIRATORY:  Clear to auscultation without rales, wheezing or rhonchi  ?ABDOMEN: Soft, non-tender, non-distended ?MUSCULOSKELETAL:  No edema; No deformity  ?SKIN: Warm and dry ?NEUROLOGIC:  Alert and oriented x 3 ?PSYCHIATRIC:  Normal affect  ? ?ASSESSMENT:   ? ?No diagnosis found. ?PLAN:   ? ?In order of problems listed above: ? ?#Paroxysmal Afib: ?CHADs-vasc 5. Developed in the setting of multiple stressors including diverticulitis, aspiration PNA and encephalopathy. Was previously on apuxaban, however, given risks of falls, patient was concerned about continuing the  medication. 2 week zio showed NSR, no episodes of Afib.  ?-? Stop apixaban ?-? [place] ?-Declined sleep study ? ?#HTN: ?-Continue metop 78m XL daily ?-Continue amlodipine 575mdaily ?-Continue losartan 5028maily ? ?#CKD IIIa: ?-Stable ? ?#HLD: ?-Continue lipitor 61m55mily ? ?#DMII: ?Managed by PCP. ?-Continue metformin and jardiance ? ?   ? ?{Are you ordering a CV Procedure (e.g. stress test, cath, DCCV, TEE, etc)?   Press F2        :[id number]}? ?Medication Adjustments/Labs and Tests Ordered: ?Current medicines are reviewed at length with the patient today.  Concerns regarding medicines are outlined above.  ?No orders of the defined types were placed in this encounter. ? ?No orders of the defined types were placed in this encounter. ? ? ?There are no Patient Instructions on file for this visit.  ? ?Signed, ?HeatFreada Bergeron  ?09/14/2021 11:52 AM    ?[facility]

## 2021-09-15 NOTE — Telephone Encounter (Signed)
? ?  Name: GHADA ABBETT  ?DOB: Jan 04, 1946  ?MRN: [id number]  ? ?Primary Cardiologist: Freada Bergeron, MD ? ?Chart reviewed as part of pre-operative protocol coverage. We have been asked to provider recommendations regarding anticoagulation hold. Per our clinical pharmacist: ? ?Patient with diagnosis of A Fib on Eliquis for anticoagulation.   ?  ?Procedure:  Fracture of her mid foot ?Date of procedure: 09/17/21 ?  ?  ?CHA2DS2-VASc Score = 5  ?This indicates a 7.2% annual risk of stroke. ?The patient's score is based upon: ?CHF History: 0 ?HTN History: 1 ?Diabetes History: 1 ?Stroke History: 0 ?Vascular Disease History: 0 ?Age Score: 2 ?Gender Score: 1 ?  ?  ?CrCl 55 mL/min ?Platelet count 270K ?  ?Per office protocol, patient can hold Eliquis for 2-3 days prior to procedure ? ?I will route this recommendation to the requesting party via Epic fax function and remove from pre-op pool. Please call with questions. ? ?Ledora Bottcher, PA ?09/15/2021, 6:30 AM ? ?

## 2021-09-16 ENCOUNTER — Ambulatory Visit: Payer: Medicare Other | Admitting: Cardiology

## 2021-09-23 ENCOUNTER — Other Ambulatory Visit: Payer: Self-pay | Admitting: Family Medicine

## 2021-09-26 IMAGING — CT CT HEAD W/O CM
3 series · 15 of 47 positions shown, 18 images · non-contrast
Comparison: None.

CLINICAL DATA: 74-year-old female with head trauma.

EXAM:
CT HEAD WITHOUT CONTRAST
CT CERVICAL SPINE WITHOUT CONTRAST
TECHNIQUE: Multidetector CT imaging of the head and cervical spine was
performed following the standard protocol without intravenous
contrast. Multiplanar CT image reconstructions of the cervical spine
were also generated.

[Series 3: head wo · axial · 0.47mm/px · z∈[+1440,+1565]mm · 9 of 31 slices shown, 12 images]
[im 3/31  brain]
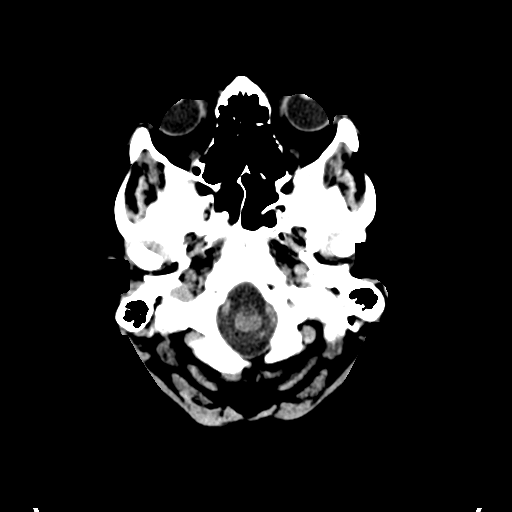
[im 3/31  bone]
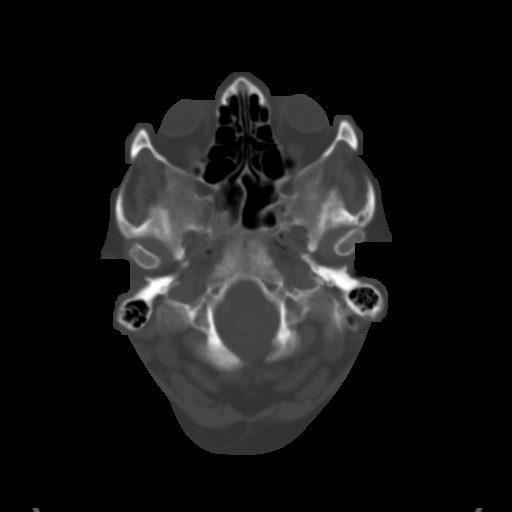
[im 6/31  brain]
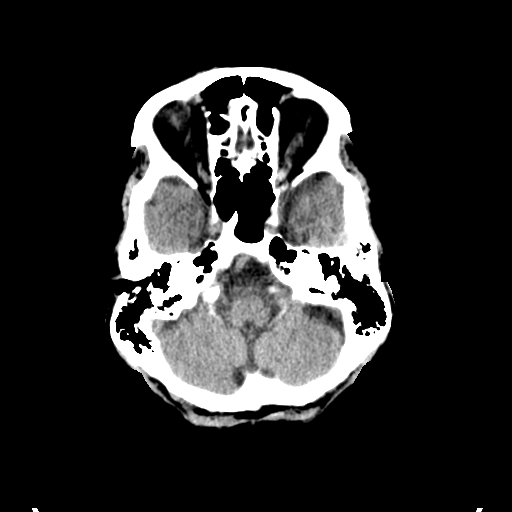
[im 9/31  brain]
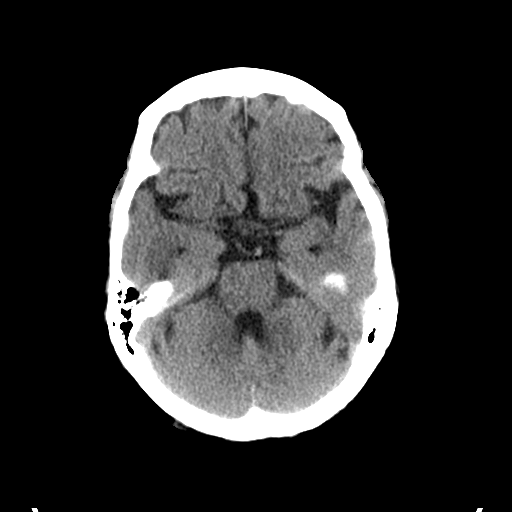
[im 12/31  brain]
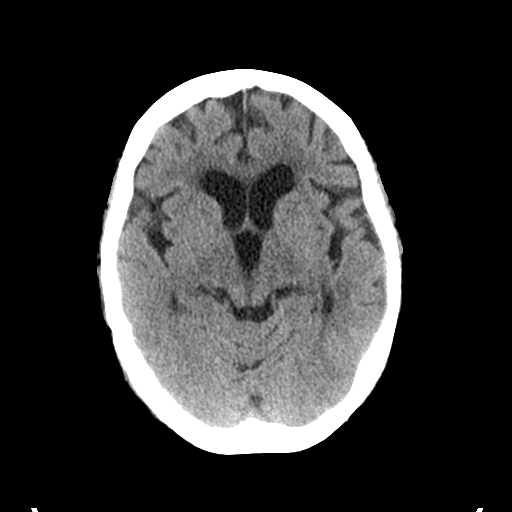
[im 16/31  brain]
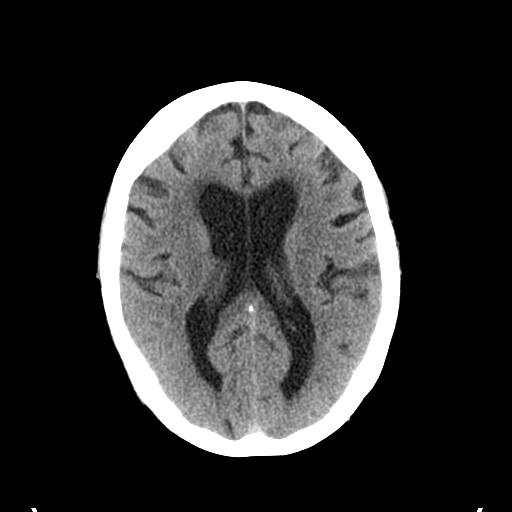
[im 16/31  bone]
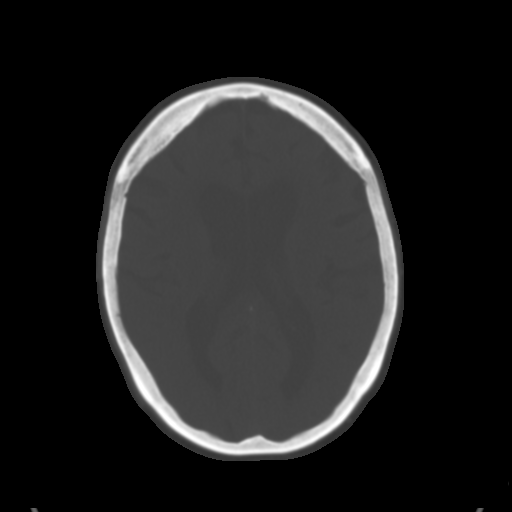
[im 19/31  brain]
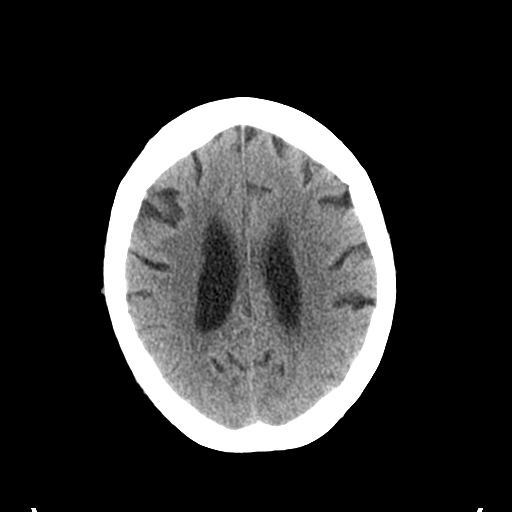
[im 22/31  brain]
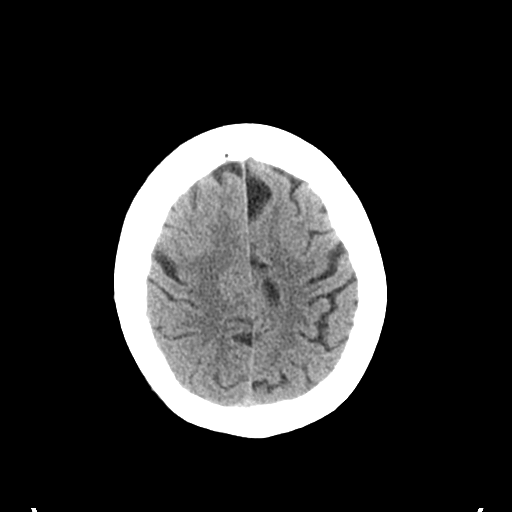
[im 25/31  brain]
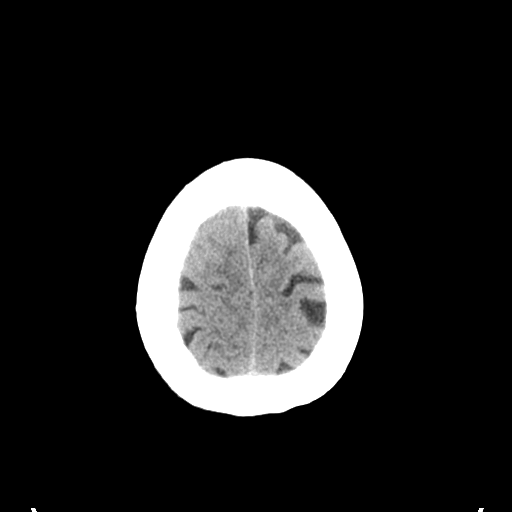
[im 28/31  brain]
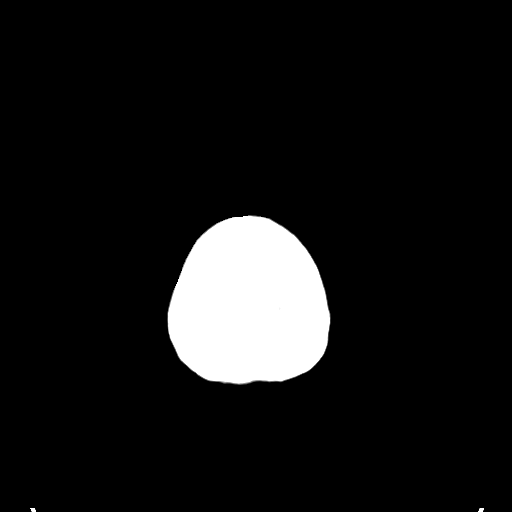
[im 28/31  bone]
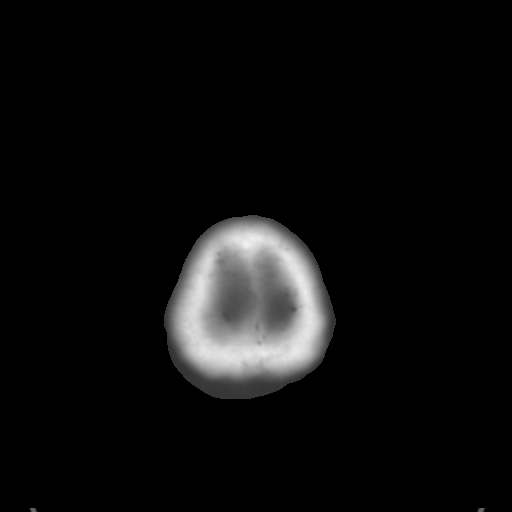

[Series 5: coronal soft tissue · coronal · 0.29mm/px · 3 of 62 slices shown]
[im 21/62  brain]
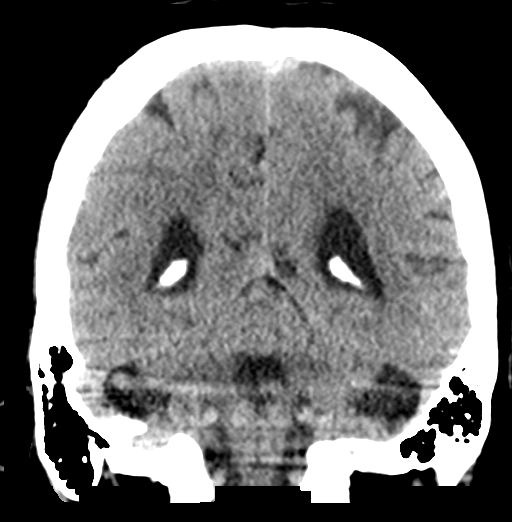
[im 28/62  brain]
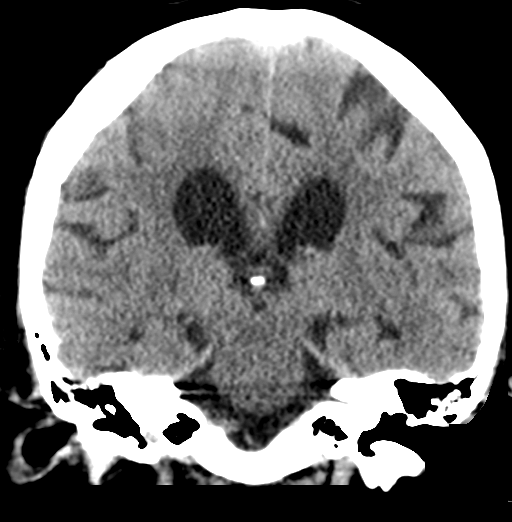
[im 34/62  brain]
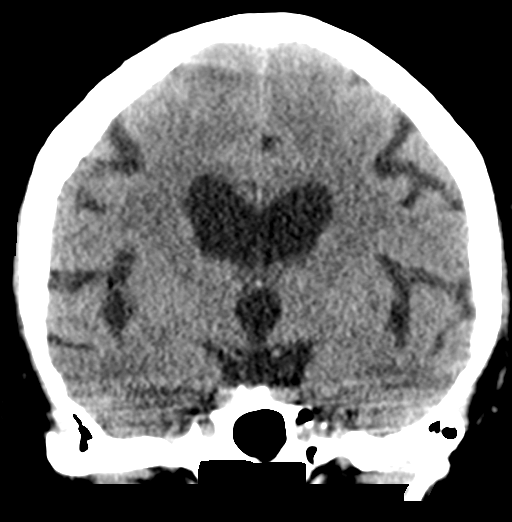

[Series 6: sagittal soft tissue · sagittal · 0.29mm/px · 3 of 48 slices shown]
[im 16/48  brain]
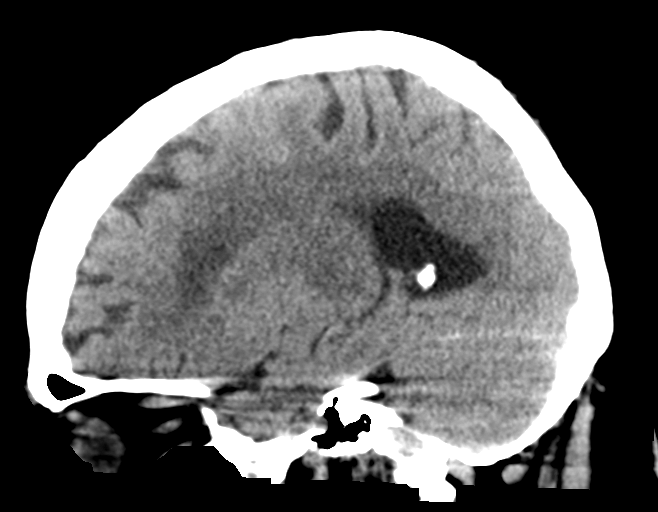
[im 24/48  brain]
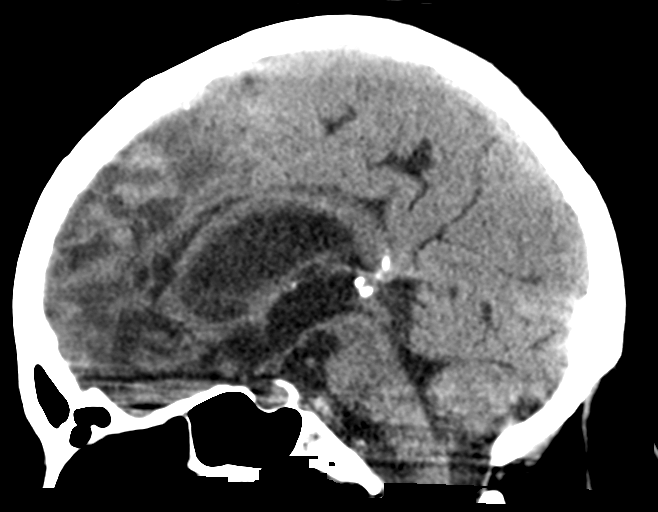
[im 32/48  brain]
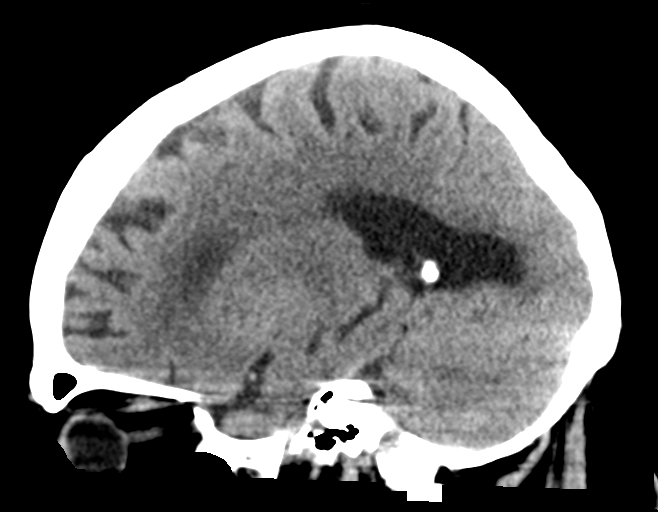

[15 of 47 positions shown; findings below may reference images not displayed]

FINDINGS: CT HEAD FINDINGS

Brain: There is mild age-related atrophy and chronic microvascular
ischemic changes. There is no acute intracranial hemorrhage. No mass
effect or midline shift. No extra-axial fluid collection.

Vascular: No hyperdense vessel or unexpected calcification.

Skull: Normal. Negative for fracture or focal lesion.

Sinuses/Orbits: No acute finding.

Other: None

CT CERVICAL SPINE FINDINGS

Alignment: No acute subluxation. There is reversal of normal
cervical lordosis which may be positional or due to muscle spasm or
secondary to degenerative changes. Grade 1 C3-C4 anterolisthesis.

Skull base and vertebrae: No acute fracture.

Soft tissues and spinal canal: No prevertebral fluid or swelling. No
visible canal hematoma.

Disc levels: Multilevel degenerative changes with endplate
irregularity and disc space narrowing and osteophyte.

Upper chest: Negative.

Other: Atherosclerotic calcification of the aortic arch.
IMPRESSION: 1. No acute intracranial pathology. Mild age-related atrophy and
chronic microvascular ischemic changes.
2. No acute/traumatic cervical spine pathology. Multilevel
degenerative changes.
3. Aortic Atherosclerosis ([id number]).

## 2021-09-29 ENCOUNTER — Other Ambulatory Visit: Payer: Self-pay | Admitting: Family Medicine

## 2021-09-30 ENCOUNTER — Other Ambulatory Visit: Payer: Self-pay | Admitting: Family Medicine

## 2021-10-20 ENCOUNTER — Other Ambulatory Visit: Payer: Self-pay | Admitting: Family Medicine

## 2021-10-28 ENCOUNTER — Telehealth: Payer: Self-pay | Admitting: Family Medicine

## 2021-10-28 NOTE — Telephone Encounter (Signed)
Left message for patient's daughter, Tye [place], to call back and schedule Medicare Annual Wellness Visit (AWV) in office.  ? ?If not able to come in office, please offer to do virtually or by telephone.  Left office number and my jabber [phone]. ? ?Last AWV:08/11/2020 ? ?Please schedule at anytime with Nurse Health Advisor. ?  ?

## 2021-11-18 ENCOUNTER — Other Ambulatory Visit: Payer: Self-pay | Admitting: Family Medicine

## 2021-11-19 ENCOUNTER — Other Ambulatory Visit: Payer: Self-pay | Admitting: Family Medicine

## 2021-11-19 NOTE — Telephone Encounter (Signed)
Requested medication (s) are due for refill today -yes  Requested medication (s) are on the active medication list -yes  Future visit scheduled -no  Last refill: 09/23/21 #30 1RF  Notes to clinic: Attempted to call patient to schedule appointment- left message to call office for appointment. Fails visit protocol  Requested Prescriptions  Pending Prescriptions Disp Refills   metoprolol succinate (TOPROL-XL) 25 MG 24 hr tablet [Pharmacy Med Name: METOPROLOL SUCCINATE ER 25 MG TAB] 30 tablet 1    Sig: TAKE 1 TABLET BY MOUTH ONCE A DAY     Cardiovascular:  Beta Blockers Failed - 11/19/2021  3:41 PM      Failed - Valid encounter within last 6 months    Recent Outpatient Visits           9 months ago Type 2 diabetes mellitus with hyperlipidemia ([place])   [place] Pickard, Cammie Mcgee, MD   1 year ago Bibasilar crackles   [place] [name], MD   1 year ago Type 2 diabetes mellitus with hyperlipidemia ([place])   [place] [name], MD   1 year ago Type 2 diabetes mellitus with hyperlipidemia ([place])   [place] Pickard, Cammie Mcgee, MD   3 years ago Carpal tunnel syndrome of right wrist   [place] Pickard, Cammie Mcgee, MD               Passed - Last BP in normal range    BP Readings from Last 1 Encounters:  04/29/21 124/80         Passed - Last Heart Rate in normal range    Pulse Readings from Last 1 Encounters:  04/29/21 70          QUEtiapine (SEROQUEL) 50 MG tablet [Pharmacy Med Name: QUETIAPINE FUMARATE 50 MG TAB] 30 tablet 1    Sig: TAKE 1 TABLET BY MOUTH DAILY AT BEDTIME     Not Delegated - Psychiatry:  Antipsychotics - Second Generation (Atypical) - quetiapine Failed - 11/19/2021  3:41 PM      Failed - This refill cannot be delegated      Failed - Valid encounter within last 6 months    Recent Outpatient Visits           9 months ago Type 2 diabetes mellitus with  hyperlipidemia ([place])   [place] Pickard, Cammie Mcgee, MD   1 year ago Bibasilar crackles   [place] [name], MD   1 year ago Type 2 diabetes mellitus with hyperlipidemia ([place])   [place] [name], MD   1 year ago Type 2 diabetes mellitus with hyperlipidemia ([place])   [place] Pickard, Cammie Mcgee, MD   3 years ago Carpal tunnel syndrome of right wrist   [place] Pickard, Cammie Mcgee, MD               Failed - Lipid Panel in normal range within the last 12 months    Cholesterol  Date Value Ref Range Status  08/11/2020 148 <200 mg/dL Final   LDL Cholesterol (Calc)  Date Value Ref Range Status  08/11/2020 70 mg/dL (calc) Final    Comment:    Reference range: <100 . Desirable range <100 mg/dL for primary prevention;   <70 mg/dL for patients with CHD or diabetic patients  with > or = 2 CHD risk factors. Marland Kitchen  LDL-C is now calculated using the Martin-Hopkins  calculation, which is a validated novel method providing  better accuracy than the Friedewald equation in the  estimation of LDL-C.  Cresenciano Genre et al. Annamaria Helling. 7616;073(71): 2061-2068  (http://education.[facility].com/faq/FAQ164)    Direct LDL  Date Value Ref Range Status  11/22/2019 62 <100 mg/dL Final    Comment:    Greatly elevated Triglycerides values (>1200 mg/dL) interfere with the dLDL assay. As no Triglycerides  testing was ordered, interpret results with caution. . Desirable range <100 mg/dL for primary prevention;   <70 mg/dL for patients with CHD or diabetic patients  with > or = 2 CHD risk factors. Marland Kitchen    HDL  Date Value Ref Range Status  08/11/2020 37 (L) > OR = 50 mg/dL Final   Triglycerides  Date Value Ref Range Status  08/11/2020 340 (H) <150 mg/dL Final    Comment:    . If a non-fasting specimen was collected, consider repeat triglyceride testing on a fasting specimen if clinically  indicated.  Yates Decamp et al. J. of Clin. Lipidol. 0626;9:[id number]. Marland Kitchen          Passed - TSH in normal range and within 360 days    TSH  Date Value Ref Range Status  01/01/2021 1.287 0.350 - 4.500 uIU/mL Final    Comment:    Performed by a 3rd Generation assay with a functional sensitivity of <=0.01 uIU/mL. Performed at [facility], [place] [address] [place], [place] [address]   12/16/2020 2.520 0.450 - 4.500 uIU/mL Final         Passed - Last BP in normal range    BP Readings from Last 1 Encounters:  04/29/21 124/80         Passed - Last Heart Rate in normal range    Pulse Readings from Last 1 Encounters:  04/29/21 70         Passed - CBC within normal limits and completed in the last 12 months    WBC  Date Value Ref Range Status  02/03/2021 7.7 3.8 - 10.8 Thousand/uL Final   RBC  Date Value Ref Range Status  02/03/2021 4.15 3.80 - 5.10 Million/uL Final   Hemoglobin  Date Value Ref Range Status  02/03/2021 12.2 11.7 - 15.5 g/dL Final  12/16/2020 11.8 11.1 - 15.9 g/dL Final   HCT  Date Value Ref Range Status  02/03/2021 38.1 35.0 - 45.0 % Final   Hematocrit  Date Value Ref Range Status  12/16/2020 36.5 34.0 - 46.6 % Final   MCHC  Date Value Ref Range Status  02/03/2021 32.0 32.0 - 36.0 g/dL Final   [facility]  Date Value Ref Range Status  02/03/2021 29.4 27.0 - 33.0 pg Final   MCV  Date Value Ref Range Status  02/03/2021 91.8 80.0 - 100.0 fL Final  12/16/2020 91 79 - 97 fL Final   No results found for: PLTCOUNTKUC, LABPLAT, POCPLA RDW  Date Value Ref Range Status  02/03/2021 14.4 11.0 - 15.0 % Final  12/16/2020 13.7 11.7 - 15.4 % Final         Passed - CMP within normal limits and completed in the last 12 months    Albumin  Date Value Ref Range Status  01/04/2021 2.3 (L) 3.5 - 5.0 g/dL Final  12/16/2020 4.5 3.7 - 4.7 g/dL Final   Alkaline Phosphatase  Date Value Ref Range Status  01/04/2021 90 38 - 126 U/L Final   Alkaline phosphatase (APISO)   Date  Value Ref Range Status  02/03/2021 68 37 - 153 U/L Final   ALT  Date Value Ref Range Status  02/03/2021 14 6 - 29 U/L Final   AST  Date Value Ref Range Status  02/03/2021 18 10 - 35 U/L Final   BUN  Date Value Ref Range Status  02/03/2021 15 7 - 25 mg/dL Final  12/16/2020 17 8 - 27 mg/dL Final   Calcium  Date Value Ref Range Status  02/03/2021 9.1 8.6 - 10.4 mg/dL Final   CO2  Date Value Ref Range Status  02/03/2021 19 (L) 20 - 32 mmol/L Final   Creat  Date Value Ref Range Status  02/03/2021 1.05 (H) 0.60 - 1.00 mg/dL Final   Creatinine, Urine  Date Value Ref Range Status  05/28/2011 23.13 mg/dL Final   Glucose, Bld  Date Value Ref Range Status  02/03/2021 84 65 - 99 mg/dL Final    Comment:    .            Fasting reference interval .    Glucose-Capillary  Date Value Ref Range Status  01/05/2021 96 70 - 99 mg/dL Final    Comment:    Glucose reference range applies only to samples taken after fasting for at least 8 hours.   Potassium  Date Value Ref Range Status  02/03/2021 5.1 3.5 - 5.3 mmol/L Final   Sodium  Date Value Ref Range Status  02/03/2021 137 135 - 146 mmol/L Final  12/16/2020 143 134 - 144 mmol/L Final   Total Bilirubin  Date Value Ref Range Status  02/03/2021 0.4 0.2 - 1.2 mg/dL Final   Bilirubin Total  Date Value Ref Range Status  12/16/2020 0.3 0.0 - 1.2 mg/dL Final   Bilirubin, Direct  Date Value Ref Range Status  06/18/2016 0.1 <=0.2 mg/dL Final   Indirect Bilirubin  Date Value Ref Range Status  06/18/2016 0.4 0.2 - 1.2 mg/dL Final   Protein, ur  Date Value Ref Range Status  12/30/2020 100 (A) NEGATIVE mg/dL Final   Total Protein  Date Value Ref Range Status  02/03/2021 6.7 6.1 - 8.1 g/dL Final  12/16/2020 7.4 6.0 - 8.5 g/dL Final   GFR, Est African American  Date Value Ref Range Status  08/11/2020 56 (L) > OR = 60 mL/min/1.43m Final   eGFR  Date Value Ref Range Status  02/03/2021 55 (L) > OR = 60  mL/min/1.725mFinal    Comment:    The eGFR is based on the CKD-EPI 2021 equation. To calculate  the new eGFR from a previous Creatinine or Cystatin C result, go to https://www.kidney.org/professionals/ kdoqi/gfr%5Fcalculator   12/16/2020 50 (L) >59 mL/min/1.73 Final   GFR, Est Non African American  Date Value Ref Range Status  08/11/2020 48 (L) > OR = 60 mL/min/1.7345minal   GFR, Estimated  Date Value Ref Range Status  01/04/2021 57 (L) >60 mL/min Final    Comment:    (NOTE) Calculated using the CKD-EPI Creatinine Equation (2021)             Requested Prescriptions  Pending Prescriptions Disp Refills   metoprolol succinate (TOPROL-XL) 25 MG 24 hr tablet [Pharmacy Med Name: METOPROLOL SUCCINATE ER 25 MG TAB] 30 tablet 1    Sig: TAKE 1 TABLET BY MOUTH ONCE A DAY     Cardiovascular:  Beta Blockers Failed - 11/19/2021  3:41 PM      Failed - Valid encounter within last 6 months    Recent Outpatient Visits  9 months ago Type 2 diabetes mellitus with hyperlipidemia ([place])   [place] Pickard, Cammie Mcgee, MD   1 year ago Bibasilar crackles   [place] [name], MD   1 year ago Type 2 diabetes mellitus with hyperlipidemia ([place])   [place] [name], MD   1 year ago Type 2 diabetes mellitus with hyperlipidemia ([place])   [facility] Pickard, Cammie Mcgee, MD   3 years ago Carpal tunnel syndrome of right wrist   [place] Pickard, Cammie Mcgee, MD               Passed - Last BP in normal range    BP Readings from Last 1 Encounters:  04/29/21 124/80         Passed - Last Heart Rate in normal range    Pulse Readings from Last 1 Encounters:  04/29/21 70          QUEtiapine (SEROQUEL) 50 MG tablet [Pharmacy Med Name: QUETIAPINE FUMARATE 50 MG TAB] 30 tablet 1    Sig: TAKE 1 TABLET BY MOUTH DAILY AT BEDTIME     Not Delegated - Psychiatry:  Antipsychotics -  Second Generation (Atypical) - quetiapine Failed - 11/19/2021  3:41 PM      Failed - This refill cannot be delegated      Failed - Valid encounter within last 6 months    Recent Outpatient Visits           9 months ago Type 2 diabetes mellitus with hyperlipidemia ([place])   [place] Pickard, Cammie Mcgee, MD   1 year ago Bibasilar crackles   [place] [name], MD   1 year ago Type 2 diabetes mellitus with hyperlipidemia ([place])   [place] [name], MD   1 year ago Type 2 diabetes mellitus with hyperlipidemia ([place])   [place] Pickard, Cammie Mcgee, MD   3 years ago Carpal tunnel syndrome of right wrist   [place] [name], MD               Failed - Lipid Panel in normal range within the last 12 months    Cholesterol  Date Value Ref Range Status  08/11/2020 148 <200 mg/dL Final   LDL Cholesterol (Calc)  Date Value Ref Range Status  08/11/2020 70 mg/dL (calc) Final    Comment:    Reference range: <100 . Desirable range <100 mg/dL for primary prevention;   <70 mg/dL for patients with CHD or diabetic patients  with > or = 2 CHD risk factors. Marland Kitchen LDL-C is now calculated using the Martin-Hopkins  calculation, which is a validated novel method providing  better accuracy than the Friedewald equation in the  estimation of LDL-C.  Cresenciano Genre et al. Annamaria Helling. 9381;017(51): 2061-2068  (http://education.[facility].com/faq/FAQ164)    Direct LDL  Date Value Ref Range Status  11/22/2019 62 <100 mg/dL Final    Comment:    Greatly elevated Triglycerides values (>1200 mg/dL) interfere with the dLDL assay. As no Triglycerides  testing was ordered, interpret results with caution. . Desirable range <100 mg/dL for primary prevention;   <70 mg/dL for patients with CHD or diabetic patients  with > or = 2 CHD risk factors. Marland Kitchen    HDL  Date Value Ref Range Status   08/11/2020 37 (L) > OR = 50 mg/dL Final  Triglycerides  Date Value Ref Range Status  08/11/2020 340 (H) <150 mg/dL Final    Comment:    . If a non-fasting specimen was collected, consider repeat triglyceride testing on a fasting specimen if clinically indicated.  Yates Decamp et al. J. of Clin. Lipidol. 8563;1:497-026. Marland Kitchen          Passed - TSH in normal range and within 360 days    TSH  Date Value Ref Range Status  01/01/2021 1.287 0.350 - 4.500 uIU/mL Final    Comment:    Performed by a 3rd Generation assay with a functional sensitivity of <=0.01 uIU/mL. Performed at [facility], [place] [address], [place], [place] [address]   12/16/2020 2.520 0.450 - 4.500 uIU/mL Final         Passed - Last BP in normal range    BP Readings from Last 1 Encounters:  04/29/21 124/80         Passed - Last Heart Rate in normal range    Pulse Readings from Last 1 Encounters:  04/29/21 70         Passed - CBC within normal limits and completed in the last 12 months    WBC  Date Value Ref Range Status  02/03/2021 7.7 3.8 - 10.8 Thousand/uL Final   RBC  Date Value Ref Range Status  02/03/2021 4.15 3.80 - 5.10 Million/uL Final   Hemoglobin  Date Value Ref Range Status  02/03/2021 12.2 11.7 - 15.5 g/dL Final  12/16/2020 11.8 11.1 - 15.9 g/dL Final   HCT  Date Value Ref Range Status  02/03/2021 38.1 35.0 - 45.0 % Final   Hematocrit  Date Value Ref Range Status  12/16/2020 36.5 34.0 - 46.6 % Final   MCHC  Date Value Ref Range Status  02/03/2021 32.0 32.0 - 36.0 g/dL Final   [facility]  Date Value Ref Range Status  02/03/2021 29.4 27.0 - 33.0 pg Final   MCV  Date Value Ref Range Status  02/03/2021 91.8 80.0 - 100.0 fL Final  12/16/2020 91 79 - 97 fL Final   No results found for: PLTCOUNTKUC, LABPLAT, POCPLA RDW  Date Value Ref Range Status  02/03/2021 14.4 11.0 - 15.0 % Final  12/16/2020 13.7 11.7 - 15.4 % Final         Passed - CMP within normal limits and completed in  the last 12 months    Albumin  Date Value Ref Range Status  01/04/2021 2.3 (L) 3.5 - 5.0 g/dL Final  12/16/2020 4.5 3.7 - 4.7 g/dL Final   Alkaline Phosphatase  Date Value Ref Range Status  01/04/2021 90 38 - 126 U/L Final   Alkaline phosphatase (APISO)  Date Value Ref Range Status  02/03/2021 68 37 - 153 U/L Final   ALT  Date Value Ref Range Status  02/03/2021 14 6 - 29 U/L Final   AST  Date Value Ref Range Status  02/03/2021 18 10 - 35 U/L Final   BUN  Date Value Ref Range Status  02/03/2021 15 7 - 25 mg/dL Final  12/16/2020 17 8 - 27 mg/dL Final   Calcium  Date Value Ref Range Status  02/03/2021 9.1 8.6 - 10.4 mg/dL Final   CO2  Date Value Ref Range Status  02/03/2021 19 (L) 20 - 32 mmol/L Final   Creat  Date Value Ref Range Status  02/03/2021 1.05 (H) 0.60 - 1.00 mg/dL Final   Creatinine, Urine  Date Value Ref Range Status  05/28/2011 23.13  mg/dL Final   Glucose, Bld  Date Value Ref Range Status  02/03/2021 84 65 - 99 mg/dL Final    Comment:    .            Fasting reference interval .    Glucose-Capillary  Date Value Ref Range Status  01/05/2021 96 70 - 99 mg/dL Final    Comment:    Glucose reference range applies only to samples taken after fasting for at least 8 hours.   Potassium  Date Value Ref Range Status  02/03/2021 5.1 3.5 - 5.3 mmol/L Final   Sodium  Date Value Ref Range Status  02/03/2021 137 135 - 146 mmol/L Final  12/16/2020 143 134 - 144 mmol/L Final   Total Bilirubin  Date Value Ref Range Status  02/03/2021 0.4 0.2 - 1.2 mg/dL Final   Bilirubin Total  Date Value Ref Range Status  12/16/2020 0.3 0.0 - 1.2 mg/dL Final   Bilirubin, Direct  Date Value Ref Range Status  06/18/2016 0.1 <=0.2 mg/dL Final   Indirect Bilirubin  Date Value Ref Range Status  06/18/2016 0.4 0.2 - 1.2 mg/dL Final   Protein, ur  Date Value Ref Range Status  12/30/2020 100 (A) NEGATIVE mg/dL Final   Total Protein  Date Value Ref Range Status   02/03/2021 6.7 6.1 - 8.1 g/dL Final  12/16/2020 7.4 6.0 - 8.5 g/dL Final   GFR, Est African American  Date Value Ref Range Status  08/11/2020 56 (L) > OR = 60 mL/min/1.52m Final   eGFR  Date Value Ref Range Status  02/03/2021 55 (L) > OR = 60 mL/min/1.778mFinal    Comment:    The eGFR is based on the CKD-EPI 2021 equation. To calculate  the new eGFR from a previous Creatinine or Cystatin C result, go to https://www.kidney.org/professionals/ kdoqi/gfr%5Fcalculator   12/16/2020 50 (L) >59 mL/min/1.73 Final   GFR, Est Non African American  Date Value Ref Range Status  08/11/2020 48 (L) > OR = 60 mL/min/1.735minal   GFR, Estimated  Date Value Ref Range Status  01/04/2021 57 (L) >60 mL/min Final    Comment:    (NOTE) Calculated using the CKD-EPI Creatinine Equation (2021)

## 2021-11-20 NOTE — Telephone Encounter (Signed)
LOV 02/03/21 Last refill 09/23/21, #30, 1 refills   LM for pt to call and make an OV  Please review, thanks!

## 2021-11-23 NOTE — Telephone Encounter (Signed)
Rx 10/20/21 #90- too soon Requested Prescriptions  Pending Prescriptions Disp Refills  . PARoxetine (PAXIL) 20 MG tablet [Pharmacy Med Name: PAROXETINE HCL 20 MG TAB] 90 tablet 0    Sig: TAKE 1 TABLET BY MOUTH ONCE A DAY     Psychiatry:  Antidepressants - SSRI Failed - 11/23/2021  1:16 PM      Failed - Valid encounter within last 6 months    Recent Outpatient Visits          9 months ago Type 2 diabetes mellitus with hyperlipidemia ([place])   [place] Pickard, Cammie Mcgee, MD   1 year ago Bibasilar crackles   [place] [name], MD   1 year ago Type 2 diabetes mellitus with hyperlipidemia ([place])   [place] [name], MD   2 years ago Type 2 diabetes mellitus with hyperlipidemia ([place])   [place] Pickard, Cammie Mcgee, MD   3 years ago Carpal tunnel syndrome of right wrist   [place] Pickard, Cammie Mcgee, MD

## 2021-11-25 ENCOUNTER — Telehealth: Payer: Self-pay | Admitting: Family Medicine

## 2021-11-25 NOTE — Telephone Encounter (Signed)
Left message for patient to call back and schedule Medicare Annual Wellness Visit (AWV) in office.   If not able to come in office, please offer to do virtually or by telephone.  Left office number and my jabber [phone].  Last AWV:08/11/2020  Please schedule at anytime with Nurse Health Advisor.

## 2021-12-15 ENCOUNTER — Ambulatory Visit (INDEPENDENT_AMBULATORY_CARE_PROVIDER_SITE_OTHER): Payer: Medicare Other | Admitting: Family Medicine

## 2021-12-15 VITALS — BP 140/82 | HR 59 | Temp 98.1°F | Ht 65.0 in | Wt 161.0 lb

## 2021-12-15 DIAGNOSIS — E785 Hyperlipidemia, unspecified: Secondary | ICD-10-CM

## 2021-12-15 DIAGNOSIS — R531 Weakness: Secondary | ICD-10-CM | POA: Diagnosis not present

## 2021-12-15 DIAGNOSIS — Z0000 Encounter for general adult medical examination without abnormal findings: Secondary | ICD-10-CM | POA: Diagnosis not present

## 2021-12-15 DIAGNOSIS — F419 Anxiety disorder, unspecified: Secondary | ICD-10-CM

## 2021-12-15 DIAGNOSIS — R413 Other amnesia: Secondary | ICD-10-CM

## 2021-12-15 DIAGNOSIS — I10 Essential (primary) hypertension: Secondary | ICD-10-CM

## 2021-12-15 DIAGNOSIS — E1169 Type 2 diabetes mellitus with other specified complication: Secondary | ICD-10-CM

## 2021-12-15 MED ORDER — MIRABEGRON ER 25 MG PO TB24: 25.0000 mg | ORAL_TABLET | Freq: Every day | ORAL | 3 refills | Status: DC

## 2021-12-15 NOTE — Progress Notes (Signed)
Subjective:    Patient ID: Kathryn Wall, female    DOB: 1945-08-27, 76 y.o.   MRN: [id number] Patient is a very pleasant 76 year old Caucasian female here today for a wellness visit.  I have not seen the patient since August of last year.  I do not see any lab work since August of last year.  Since I last saw the patient, she suffered a fracture to her left foot that required surgery.  She is walking today with pain.  There is some hyperpigmentation to the dorsum of the left foot however the surgical scar is well-healed and there is no evidence of any peripheral vascular disease.  She has excellent dorsalis pedis and posterior tibialis pulses bilaterally and normal sensation to a 10 g monofilament.  There is some confusion about the patient's medication list.  Since I last saw her she was diagnosed with paroxysmal atrial fibrillation.  Cardiology started her on Eliquis for stroke prevention.  However meloxicam is on her medication list.  I explained to the patient I do not want her taking any arthritis medication with Eliquis and I discontinue meloxicam.  I asked the patient how her sugars are doing.  She has not been checking them regularly.  She denies any polyuria polydipsia or blurry vision.  However, she does have some short-term memory loss.  She states that she is doing better than when I last saw her.  She was plagued by chronic abdominal pain.  She was also plagued by insomnia.  However she has successfully weaned off Xanax and is no longer taking that.  Overall, she seems to be doing much better.  She is no longer suffering from any abdominal pain.  She does state that she occasionally gets anxious and would like something she could take at night when she feels alone or isolated.  Her husband passed away last year.  However I explained to the patient that I am scared of her taking Xanax due to her fall history and due to memory loss especially given the fact that she lives independently.  I am  also not completely certain that her medicine list is correct as she is uncertain on some of the medications. Immunization History  Administered Date(s) Administered   Fluad Quad(high Dose 65+) 05/03/2019   Influenza Split 05/31/2011   Influenza, High Dose Seasonal PF 04/30/2013, 03/17/2017   Influenza,inj,Quad PF,6+ Mos 04/21/2015, 05/25/2016   PFIZER(Purple Top)SARS-COV-2 Vaccination 07/18/2019, 08/08/2019   Pneumococcal Conjugate-13 08/08/2013, 10/18/2013   Pneumococcal Polysaccharide-23 05/31/2011    Past Medical History:  Diagnosis Date   Anxiety    Breast cancer ([place]) 06/29/1995   AGE 42, BRCA 1 NEGATIVE 2. 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    CKD (chronic kidney disease), stage III (HCC)    Cognitive deficits    Diabetes mellitus    TYPE II   High cholesterol    Hypertension    PAF (paroxysmal atrial fibrillation) ([place])    Past Surgical History:  Procedure Laterality Date   [place]   RIGHT BREAST LUMPECTOMY   CHOLECYSTECTOMY  1980   Current Outpatient Medications on File Prior to Visit  Medication Sig Dispense Refill   acetaminophen (TYLENOL) 325 MG tablet Take 2 tablets (650 mg total) by mouth every 6 (six) hours as needed for mild pain (or Fever >/= 101).     amLODipine (NORVASC) 5 MG tablet TAKE 1 TABLET BY MOUTH ONCE  A DAY 90 tablet 3   Ascorbic Acid (VITA-C PO) Take 1 tablet by mouth daily.     atorvastatin (LIPITOR) 40 MG tablet TAKE 1 TABLET BY MOUTH AT BEDTIME 90 tablet 3   cholecalciferol (VITAMIN D3) 25 MCG (1000 UNIT) tablet Take 1,000 Units by mouth daily.     ELIQUIS 5 MG TABS tablet TAKE 1 TABLET BY MOUTH TWICE A DAY 180 tablet 3   JARDIANCE 25 MG TABS tablet TAKE 1 TABLET BY MOUTH ONCE A DAY 90 tablet 3   losartan (COZAAR) 50 MG tablet TAKE 1 TABLET BY MOUTH ONCE A DAY 90 tablet 3   metFORMIN (GLUCOPHAGE-XR) 500 MG 24 hr tablet TAKE 4 TABLETS BY MOUTH ONCE DAILY WITH BREAKFAST. 360  tablet 3   metoprolol succinate (TOPROL-XL) 25 MG 24 hr tablet TAKE 1 TABLET BY MOUTH ONCE A DAY 30 tablet 0   nystatin ointment (MYCOSTATIN) APPLY TO AFFECTED AREAS TWICE DAILY 30 g 3   pantoprazole (PROTONIX) 40 MG tablet TAKE 1 TABLET BY MOUTH EVERY MORNING 90 tablet 3   PARoxetine (PAXIL) 20 MG tablet TAKE 1 TABLET BY MOUTH ONCE A DAY 90 tablet 0   QUEtiapine (SEROQUEL) 50 MG tablet TAKE 1 TABLET BY MOUTH DAILY AT BEDTIME 30 tablet 1   No current facility-administered medications on file prior to visit.   Allergies  Allergen Reactions   Allergen [Antipyrine-Benzocaine] Other (See Comments)    Unknown Reaction   Ambien [Zolpidem Tartrate] Other (See Comments)    Can not tolerate, causes sleepwalking   Cymbalta [Duloxetine Hcl] Other (See Comments)    Unknown Reaction   Other     SENSITIVE TO ANTIBIOTICS   Ciprofloxacin Nausea Only    Nausea    Doxazosin Nausea And Vomiting   Percocet [Oxycodone-Acetaminophen] Nausea And Vomiting   Social History   Socioeconomic History   Marital status: Married    Spouse name: Not on file   Number of children: Not on file   Years of education: Not on file   Highest education level: Not on file  Occupational History   Not on file  Tobacco Use   Smoking status: Never   Smokeless tobacco: Never  Substance and Sexual Activity   Alcohol use: No   Drug use: No   Sexual activity: Yes    Birth control/protection: Post-menopausal, Surgical    Comment: HYST  Other Topics Concern   Not on file  Social History Narrative   Not on file   Social Determinants of Health   Financial Resource Strain: Not on file  Food Insecurity: Not on file  Transportation Needs: Not on file  Physical Activity: Not on file  Stress: Not on file  Social Connections: Not on file  Intimate Partner Violence: Not on file   Family History  Problem Relation Age of Onset   Cancer Mother        COLON   Hypertension Father    Heart disease Father        Review of Systems  All other systems reviewed and are negative.      Objective:   Physical Exam Vitals reviewed.  Constitutional:      General: She is not in acute distress.    Appearance: She is well-developed. She is not diaphoretic.  HENT:     Head: Normocephalic and atraumatic.     Right Ear: External ear normal.     Left Ear: External ear normal.     Nose: Nose normal.       Mouth/Throat:     Pharynx: No oropharyngeal exudate.  Eyes:     General: No scleral icterus.       Right eye: No discharge.        Left eye: No discharge.     Conjunctiva/sclera: Conjunctivae normal.     Pupils: Pupils are equal, round, and reactive to light.  Neck:     Thyroid: No thyromegaly.     Trachea: No tracheal deviation.  Cardiovascular:     Rate and Rhythm: Normal rate and regular rhythm.     Heart sounds: Normal heart sounds. No murmur heard.    No friction rub. No gallop.  Pulmonary:     Effort: Pulmonary effort is normal. No respiratory distress.     Breath sounds: Normal breath sounds. No stridor. No wheezing or rales.  Abdominal:     General: Bowel sounds are normal. There is no distension.     Palpations: Abdomen is soft. There is no mass.     Tenderness: There is no abdominal tenderness. There is no guarding or rebound.  Musculoskeletal:     Cervical back: Normal range of motion and neck supple.  Lymphadenopathy:     Cervical: No cervical adenopathy.  Skin:    General: Skin is warm.     Coloration: Skin is not pale.     Findings: No erythema or rash.  Neurological:     Mental Status: She is alert and oriented to person, place, and time.     Cranial Nerves: No cranial nerve deficit.     Motor: No abnormal muscle tone.     Coordination: Coordination normal.     Deep Tendon Reflexes: Reflexes normal.  Psychiatric:        Mood and Affect: Mood normal.        Speech: Speech normal.        Behavior: Behavior normal.        Thought Content: Thought content normal.         Judgment: Judgment normal.           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  Thankfully does not seem to be progressive at the present time.  But I would avoid centrally acting medications such as benzodiazepines.  Of asked her to go home and make sure she is not taking any NSAIDs including meloxicam with her Eliquis.  I want her to call us back with medication list so that we can make sure our medicine list is accurate.  Her blood pressure today is acceptable at 140/82.  She does complain of some fatigue so we will check a TSH and a B12 level.  I will also check a CBC CMP lipid panel and A1c.  Goal A1c is less than 7.  Goal LDL cholesterol is less than 100.  Immunization list are up-to-date.  I would like to make sure the patient is on an SSRI which I believe will help some with anxiety.  Could also consider a mild anxiolytic such as hydroxyzine pending upon her medication list. 

## 2021-12-15 NOTE — Addendum Note (Signed)
Addended by: Jenna Luo T on: 12/15/2021 03:35 PM   Modules accepted: Orders

## 2021-12-16 ENCOUNTER — Other Ambulatory Visit: Payer: Self-pay | Admitting: Family Medicine

## 2021-12-16 LAB — CBC WITH DIFFERENTIAL/PLATELET
Absolute Monocytes: 590 cells/uL (ref 200–950)
Basophils Absolute: 87 cells/uL (ref 0–200)
Basophils Relative: 1.3 %
Eosinophils Absolute: 382 cells/uL (ref 15–500)
Eosinophils Relative: 5.7 %
HCT: 38.3 % (ref 35.0–45.0)
Hemoglobin: 12 g/dL (ref 11.7–15.5)
Lymphs Abs: 2332 cells/uL (ref 850–3900)
MCH: 28.2 pg (ref 27.0–33.0)
MCHC: 31.3 g/dL — ABNORMAL LOW (ref 32.0–36.0)
MCV: 89.9 fL (ref 80.0–100.0)
MPV: 12.9 fL — ABNORMAL HIGH (ref 7.5–12.5)
Monocytes Relative: 8.8 %
Neutro Abs: 3310 cells/uL (ref 1500–7800)
Neutrophils Relative %: 49.4 %
Platelets: 336 10*3/uL (ref 140–400)
RBC: 4.26 10*6/uL (ref 3.80–5.10)
RDW: 13.7 % (ref 11.0–15.0)
Total Lymphocyte: 34.8 %
WBC: 6.7 10*3/uL (ref 3.8–10.8)

## 2021-12-16 LAB — COMPLETE METABOLIC PANEL WITH GFR
AG Ratio: 1.7 (calc) (ref 1.0–2.5)
ALT: 14 U/L (ref 6–29)
AST: 22 U/L (ref 10–35)
Albumin: 4.3 g/dL (ref 3.6–5.1)
Alkaline phosphatase (APISO): 99 U/L (ref 37–153)
BUN/Creatinine Ratio: 13 (calc) (ref 6–22)
BUN: 17 mg/dL (ref 7–25)
CO2: 22 mmol/L (ref 20–32)
Calcium: 9.7 mg/dL (ref 8.6–10.4)
Chloride: 105 mmol/L (ref 98–110)
Creat: 1.3 mg/dL — ABNORMAL HIGH (ref 0.60–1.00)
Globulin: 2.6 g/dL (calc) (ref 1.9–3.7)
Glucose, Bld: 110 mg/dL — ABNORMAL HIGH (ref 65–99)
Potassium: 4.9 mmol/L (ref 3.5–5.3)
Sodium: 138 mmol/L (ref 135–146)
Total Bilirubin: 0.6 mg/dL (ref 0.2–1.2)
Total Protein: 6.9 g/dL (ref 6.1–8.1)
eGFR: 42 mL/min/{1.73_m2} — ABNORMAL LOW (ref >=60)

## 2021-12-16 LAB — LIPID PANEL
Cholesterol: 136 mg/dL (ref <200)
HDL: 48 mg/dL — ABNORMAL LOW (ref >=50)
LDL Cholesterol (Calc): 61 mg/dL (calc)
Non-HDL Cholesterol (Calc): 88 mg/dL (calc) (ref <130)
Total CHOL/HDL Ratio: 2.8 (calc) (ref <5.0)
Triglycerides: 195 mg/dL — ABNORMAL HIGH (ref <150)

## 2021-12-16 LAB — VITAMIN B12: Vitamin B-12: 563 pg/mL (ref 200–1100)

## 2021-12-16 LAB — HEMOGLOBIN A1C
Hgb A1c MFr Bld: 5.9 % of total Hgb — ABNORMAL HIGH (ref <5.7)
Mean Plasma Glucose: 123 mg/dL
eAG (mmol/L): 6.8 mmol/L

## 2021-12-16 LAB — TSH: TSH: 2.1 mIU/L (ref 0.40–4.50)

## 2021-12-17 NOTE — Telephone Encounter (Signed)
Requested medication (s) are due for refill today - yes  Requested medication (s) are on the active medication list -yes  Future visit scheduled -no  Last refill: 11/20/21 #30  Notes to clinic: Last RF courtesy RF with notes. Request sent for review   Requested Prescriptions  Pending Prescriptions Disp Refills   metoprolol succinate (TOPROL-XL) 25 MG 24 hr tablet [Pharmacy Med Name: METOPROLOL SUCCINATE ER 25 MG TAB] 30 tablet 0    Sig: TAKE 1 TABLET BY MOUTH ONCE A DAY (NEED OFFICE VISIT FOR FUTURE REFILLS)     Cardiovascular:  Beta Blockers Failed - 12/16/2021  4:03 PM      Failed - Last BP in normal range    BP Readings from Last 1 Encounters:  12/15/21 140/82         Failed - Valid encounter within last 6 months    Recent Outpatient Visits           10 months ago Type 2 diabetes mellitus with hyperlipidemia ([place])   [facility] Pickard, Cammie Mcgee, MD   1 year ago Bibasilar crackles   [place] [name], MD   1 year ago Type 2 diabetes mellitus with hyperlipidemia ([place])   [place] [name], MD   2 years ago Type 2 diabetes mellitus with hyperlipidemia ([place])   [place] Pickard, Cammie Mcgee, MD   3 years ago Carpal tunnel syndrome of right wrist   [place] Pickard, Cammie Mcgee, MD              Passed - Last Heart Rate in normal range    Pulse Readings from Last 1 Encounters:  12/15/21 (!) 59            Requested Prescriptions  Pending Prescriptions Disp Refills   metoprolol succinate (TOPROL-XL) 25 MG 24 hr tablet [Pharmacy Med Name: METOPROLOL SUCCINATE ER 25 MG TAB] 30 tablet 0    Sig: TAKE 1 TABLET BY MOUTH ONCE A DAY (NEED OFFICE VISIT FOR FUTURE REFILLS)     Cardiovascular:  Beta Blockers Failed - 12/16/2021  4:03 PM      Failed - Last BP in normal range    BP Readings from Last 1 Encounters:  12/15/21 140/82         Failed - Valid encounter  within last 6 months    Recent Outpatient Visits           10 months ago Type 2 diabetes mellitus with hyperlipidemia ([place])   [place] Pickard, Cammie Mcgee, MD   1 year ago Bibasilar crackles   [place] [name], MD   1 year ago Type 2 diabetes mellitus with hyperlipidemia ([place])   [place] [name], MD   2 years ago Type 2 diabetes mellitus with hyperlipidemia ([place])   [place] Pickard, Cammie Mcgee, MD   3 years ago Carpal tunnel syndrome of right wrist   [place], Cammie Mcgee, MD              Passed - Last Heart Rate in normal range    Pulse Readings from Last 1 Encounters:  12/15/21 (!) 59

## 2021-12-28 ENCOUNTER — Other Ambulatory Visit: Payer: Self-pay | Admitting: Family Medicine

## 2021-12-30 NOTE — Telephone Encounter (Signed)
LOV 12/15/21 Last refill 11/20/21, #30, 1 refills  Please review, thanks!

## 2022-01-09 ENCOUNTER — Ambulatory Visit: Payer: Medicare Other | Admitting: Plastic Surgery

## 2022-01-09 ENCOUNTER — Encounter: Payer: Self-pay | Admitting: Plastic Surgery

## 2022-01-09 VITALS — BP 144/81 | HR 81 | Ht 65.0 in | Wt 157.2 lb

## 2022-01-09 DIAGNOSIS — Z7901 Long term (current) use of anticoagulants: Secondary | ICD-10-CM | POA: Diagnosis not present

## 2022-01-09 DIAGNOSIS — N1831 Chronic kidney disease, stage 3a: Secondary | ICD-10-CM | POA: Diagnosis not present

## 2022-01-09 DIAGNOSIS — E1122 Type 2 diabetes mellitus with diabetic chronic kidney disease: Secondary | ICD-10-CM | POA: Diagnosis not present

## 2022-01-09 DIAGNOSIS — S91302A Unspecified open wound, left foot, initial encounter: Secondary | ICD-10-CM | POA: Diagnosis not present

## 2022-01-09 DIAGNOSIS — S91309A Unspecified open wound, unspecified foot, initial encounter: Secondary | ICD-10-CM | POA: Insufficient documentation

## 2022-01-09 NOTE — H&P (View-Only) (Signed)
Patient ID: Kathryn Wall, female    DOB: 07/21/1945, 76 y.o.   MRN: [id number]   Chief Complaint  Patient presents with  . Consult  . Skin Problem    Patient is a 76 year old female here with her daughter for evaluation of her left foot.  Patient had left foot surgery sometime ago with Dr. Lucia Gaskins.  I am having trouble finding that in the computer.  She developed some hematomas that were addressed.  When the splint was removed she was found to have a large eschar on the dorsal aspect of the left foot.  I cannot tell how deep it is or what might be exposed.  Due to her Eliquis I cannot do that in the office.  She does have a history of atrial fibrillation which is why she takes the Eliquis.  She has diabetes and kidney disease as well.  She has a history of breast cancer and hypercholesterolemia.  The wound is approximately 3 x 4 cm and looks to be full-thickness.   Review of Systems  Constitutional:  Positive for activity change. Negative for appetite change.  Eyes: Negative.   Respiratory: Negative.  Negative for chest tightness and shortness of breath.   Cardiovascular:  Positive for leg swelling.  Gastrointestinal: Negative.   Endocrine: Negative.   Genitourinary: Negative.   Musculoskeletal: Negative.   Skin:  Positive for color change and wound.  Psychiatric/Behavioral: Negative.      Past Medical History:  Diagnosis Date  . Anxiety   . Breast cancer ([place]) 06/29/1995   AGE 65, BRCA 1 NEGATIVE 2. 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 CKD (chronic kidney disease), stage III ([place])   . Cognitive deficits   . Diabetes mellitus    TYPE II  . High cholesterol   . Hypertension   . PAF (paroxysmal atrial fibrillation) ([place])     Past Surgical History:  Procedure Laterality Date  . ABDOMINAL HYSTERECTOMY  1985   TAH  . BREAST SURGERY  1997   RIGHT BREAST LUMPECTOMY  . CHOLECYSTECTOMY  1980      Current Outpatient Medications:  .  acetaminophen  (TYLENOL) 325 MG tablet, Take 2 tablets (650 mg total) by mouth every 6 (six) hours as needed for mild pain (or Fever >/= 101)., Disp: , Rfl:  .  amLODipine (NORVASC) 5 MG tablet, TAKE 1 TABLET BY MOUTH ONCE A DAY, Disp: 90 tablet, Rfl: 3 .  Ascorbic Acid (VITA-C PO), Take 1 tablet by mouth daily., Disp: , Rfl:  .  atorvastatin (LIPITOR) 40 MG tablet, TAKE 1 TABLET BY MOUTH AT BEDTIME, Disp: 90 tablet, Rfl: 3 .  cholecalciferol (VITAMIN D3) 25 MCG (1000 UNIT) tablet, Take 1,000 Units by mouth daily., Disp: , Rfl:  .  ELIQUIS 5 MG TABS tablet, TAKE 1 TABLET BY MOUTH TWICE A DAY, Disp: 180 tablet, Rfl: 3 .  JARDIANCE 25 MG TABS tablet, TAKE 1 TABLET BY MOUTH ONCE A DAY, Disp: 90 tablet, Rfl: 3 .  losartan (COZAAR) 50 MG tablet, TAKE 1 TABLET BY MOUTH ONCE A DAY, Disp: 90 tablet, Rfl: 3 .  metFORMIN (GLUCOPHAGE-XR) 500 MG 24 hr tablet, TAKE 4 TABLETS BY MOUTH ONCE DAILY WITH BREAKFAST., Disp: 360 tablet, Rfl: 3 .  metoprolol succinate (TOPROL-XL) 25 MG 24 hr tablet, TAKE 1 TABLET BY MOUTH ONCE A DAY (NEED OFFICE VISIT FOR FUTURE REFILLS), Disp: 30 tablet, Rfl: 0 .  mirabegron ER (MYRBETRIQ) 25 MG TB24 tablet, Take 1  tablet (25 mg total) by mouth daily., Disp: 30 tablet, Rfl: 3 .  nystatin ointment (MYCOSTATIN), APPLY TO AFFECTED AREAS TWICE DAILY, Disp: 30 g, Rfl: 3 .  pantoprazole (PROTONIX) 40 MG tablet, TAKE 1 TABLET BY MOUTH EVERY MORNING, Disp: 90 tablet, Rfl: 3 .  PARoxetine (PAXIL) 20 MG tablet, TAKE 1 TABLET BY MOUTH ONCE A DAY, Disp: 90 tablet, Rfl: 0 .  QUEtiapine (SEROQUEL) 50 MG tablet, TAKE 1 TABLET BY MOUTH DAILY AT BEDTIME, Disp: 30 tablet, Rfl: 1   Objective:   Vitals:   01/09/22 0858  BP: (!) 144/81  Pulse: 81  SpO2: 94%    Physical Exam Vitals reviewed.  Constitutional:      Appearance: Normal appearance.  HENT:     Head: Normocephalic.  Cardiovascular:     Rate and Rhythm: Normal rate.     Pulses: Normal pulses.  Pulmonary:     Effort: Pulmonary effort is normal. No  respiratory distress.  Musculoskeletal:        General: Swelling, tenderness and signs of injury present.       Legs:  Skin:    General: Skin is warm.     Capillary Refill: Capillary refill takes less than 2 seconds.     Findings: Bruising, erythema and lesion present.  Neurological:     Mental Status: She is alert and oriented to person, place, and time.  Psychiatric:        Mood and Affect: Mood normal.        Behavior: Behavior normal.    Assessment & Plan:  Chronic kidney disease, stage 3a (HCC)  Type 2 diabetes mellitus with stage 3a chronic kidney disease, without long-term current use of insulin (HCC)  Open wound of left foot, initial encounter  We will need to get her to the operating room quickly to debride this.  I think it would be best to do a debridement with myriad placement and VAC.  Once we know how deep it is we will have a better idea of what we can do to help her heal.  In the meantime she needs to be really strict about her blood sugar control.  She needs to increase her protein.  She needs to be sure she is on a multivitamin and vitamin C.  She will need to hold her Eliquis and check with the physician who put her on that.  We will check as well.  We will need to see if she needs to be bridged with Lovenox.  We will also try and get her home health and home VAC.  Pictures were obtained of the patient and placed in the chart with the patient's or guardian's permission.   [place], DO

## 2022-01-09 NOTE — Progress Notes (Signed)
Patient ID: Kathryn Wall, female    DOB: 18-Aug-1945, 76 y.o.   MRN: [id number]   Chief Complaint  Patient presents with  . Consult  . Skin Problem    Patient is a 76 year old female here with her daughter for evaluation of her left foot.  Patient had left foot surgery sometime ago with Dr. Lucia Gaskins.  I am having trouble finding that in the computer.  She developed some hematomas that were addressed.  When the splint was removed she was found to have a large eschar on the dorsal aspect of the left foot.  I cannot tell how deep it is or what might be exposed.  Due to her Eliquis I cannot do that in the office.  She does have a history of atrial fibrillation which is why she takes the Eliquis.  She has diabetes and kidney disease as well.  She has a history of breast cancer and hypercholesterolemia.  The wound is approximately 3 x 4 cm and looks to be full-thickness.   Review of Systems  Constitutional:  Positive for activity change. Negative for appetite change.  Eyes: Negative.   Respiratory: Negative.  Negative for chest tightness and shortness of breath.   Cardiovascular:  Positive for leg swelling.  Gastrointestinal: Negative.   Endocrine: Negative.   Genitourinary: Negative.   Musculoskeletal: Negative.   Skin:  Positive for color change and wound.  Psychiatric/Behavioral: Negative.      Past Medical History:  Diagnosis Date  . Anxiety   . Breast cancer ([place]) 06/29/1995   AGE 39, BRCA 1 NEGATIVE 2. 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 CKD (chronic kidney disease), stage III ([place])   . Cognitive deficits   . Diabetes mellitus    TYPE II  . High cholesterol   . Hypertension   . PAF (paroxysmal atrial fibrillation) ([place])     Past Surgical History:  Procedure Laterality Date  . ABDOMINAL HYSTERECTOMY  1985   TAH  . BREAST SURGERY  1997   RIGHT BREAST LUMPECTOMY  . CHOLECYSTECTOMY  1980      Current Outpatient Medications:  .  acetaminophen  (TYLENOL) 325 MG tablet, Take 2 tablets (650 mg total) by mouth every 6 (six) hours as needed for mild pain (or Fever >/= 101)., Disp: , Rfl:  .  amLODipine (NORVASC) 5 MG tablet, TAKE 1 TABLET BY MOUTH ONCE A DAY, Disp: 90 tablet, Rfl: 3 .  Ascorbic Acid (VITA-C PO), Take 1 tablet by mouth daily., Disp: , Rfl:  .  atorvastatin (LIPITOR) 40 MG tablet, TAKE 1 TABLET BY MOUTH AT BEDTIME, Disp: 90 tablet, Rfl: 3 .  cholecalciferol (VITAMIN D3) 25 MCG (1000 UNIT) tablet, Take 1,000 Units by mouth daily., Disp: , Rfl:  .  ELIQUIS 5 MG TABS tablet, TAKE 1 TABLET BY MOUTH TWICE A DAY, Disp: 180 tablet, Rfl: 3 .  JARDIANCE 25 MG TABS tablet, TAKE 1 TABLET BY MOUTH ONCE A DAY, Disp: 90 tablet, Rfl: 3 .  losartan (COZAAR) 50 MG tablet, TAKE 1 TABLET BY MOUTH ONCE A DAY, Disp: 90 tablet, Rfl: 3 .  metFORMIN (GLUCOPHAGE-XR) 500 MG 24 hr tablet, TAKE 4 TABLETS BY MOUTH ONCE DAILY WITH BREAKFAST., Disp: 360 tablet, Rfl: 3 .  metoprolol succinate (TOPROL-XL) 25 MG 24 hr tablet, TAKE 1 TABLET BY MOUTH ONCE A DAY (NEED OFFICE VISIT FOR FUTURE REFILLS), Disp: 30 tablet, Rfl: 0 .  mirabegron ER (MYRBETRIQ) 25 MG TB24 tablet, Take 1  tablet (25 mg total) by mouth daily., Disp: 30 tablet, Rfl: 3 .  nystatin ointment (MYCOSTATIN), APPLY TO AFFECTED AREAS TWICE DAILY, Disp: 30 g, Rfl: 3 .  pantoprazole (PROTONIX) 40 MG tablet, TAKE 1 TABLET BY MOUTH EVERY MORNING, Disp: 90 tablet, Rfl: 3 .  PARoxetine (PAXIL) 20 MG tablet, TAKE 1 TABLET BY MOUTH ONCE A DAY, Disp: 90 tablet, Rfl: 0 .  QUEtiapine (SEROQUEL) 50 MG tablet, TAKE 1 TABLET BY MOUTH DAILY AT BEDTIME, Disp: 30 tablet, Rfl: 1   Objective:   Vitals:   01/09/22 0858  BP: (!) 144/81  Pulse: 81  SpO2: 94%    Physical Exam Vitals reviewed.  Constitutional:      Appearance: Normal appearance.  HENT:     Head: Normocephalic.  Cardiovascular:     Rate and Rhythm: Normal rate.     Pulses: Normal pulses.  Pulmonary:     Effort: Pulmonary effort is normal. No  respiratory distress.  Musculoskeletal:        General: Swelling, tenderness and signs of injury present.       Legs:  Skin:    General: Skin is warm.     Capillary Refill: Capillary refill takes less than 2 seconds.     Findings: Bruising, erythema and lesion present.  Neurological:     Mental Status: She is alert and oriented to person, place, and time.  Psychiatric:        Mood and Affect: Mood normal.        Behavior: Behavior normal.    Assessment & Plan:  Chronic kidney disease, stage 3a (HCC)  Type 2 diabetes mellitus with stage 3a chronic kidney disease, without long-term current use of insulin (HCC)  Open wound of left foot, initial encounter  We will need to get her to the operating room quickly to debride this.  I think it would be best to do a debridement with myriad placement and VAC.  Once we know how deep it is we will have a better idea of what we can do to help her heal.  In the meantime she needs to be really strict about her blood sugar control.  She needs to increase her protein.  She needs to be sure she is on a multivitamin and vitamin C.  She will need to hold her Eliquis and check with the physician who put her on that.  We will check as well.  We will need to see if she needs to be bridged with Lovenox.  We will also try and get her home health and home VAC.  Pictures were obtained of the patient and placed in the chart with the patient's or guardian's permission.   [place], DO

## 2022-01-11 ENCOUNTER — Telehealth: Payer: Self-pay

## 2022-01-11 ENCOUNTER — Ambulatory Visit (INDEPENDENT_AMBULATORY_CARE_PROVIDER_SITE_OTHER): Payer: Medicare Other | Admitting: Student

## 2022-01-11 DIAGNOSIS — S91302D Unspecified open wound, left foot, subsequent encounter: Secondary | ICD-10-CM

## 2022-01-11 MED ORDER — ZINC 220 (50 ZN) MG PO CAPS: 220.0000 mg | ORAL_CAPSULE | Freq: Every day | ORAL | 0 refills | Status: AC

## 2022-01-11 NOTE — Progress Notes (Signed)
   Patient ID: Kathryn Wall, female    DOB: 11/13/1945, 76 y.o.   MRN: [id number]  No chief complaint on file.   No diagnosis found.   History of Present Illness: Kathryn Wall is a 76 y.o.  female  with a history of left foot wound.  She presents for preoperative evaluation for upcoming procedure, debridement with myriad placement and wound vac placement, scheduled for 01/14/22 with Dr. Dillingham.  The patient has not had problems with anesthesia.  Patient reports she has A-fib and is on Eliquis.  She denies any other cardiac issues.  Patient reports she is not a smoker.  Patient denies being on any birth control or hormone replacement.  She reports she has had 1 miscarriage.  She denies any personal or family history of blood clots or clotting disease.  She denies any recent surgeries, traumas, infections, stroke, or heart attacks.  She does report she has a history of breast cancer.  She denies any varicose veins or swollen legs.  Summary of Previous Visit: Patient was seen in the clinic by Dr. Dillingham on 01/09/2022.  Patient had left foot surgery a while ago with Dr. Adair.  She developed some hematomas that were addressed.  She then had a splint to remove to which she was then found to have a large eschar on the dorsal aspect of the left foot.  Plan was to take her to the operating room for debridement with myriad placement and wound VAC.  Job: Retired  PMH Significant for: A-fib, type 2 diabetes, CKD, breast cancer  Patient's most recent A1c was 3 weeks ago.  It was 5.9.   Past Medical History: Allergies: Allergies  Allergen Reactions   Ambien [Zolpidem Tartrate] Other (See Comments)    Can not tolerate, causes sleepwalking   Cymbalta [Duloxetine Hcl] Other (See Comments)    Unknown Reaction   Other     SENSITIVE TO ANTIBIOTICS   Ciprofloxacin Nausea Only    Nausea    Doxazosin Nausea And Vomiting   Percocet [Oxycodone-Acetaminophen] Nausea And Vomiting     Current Medications:  Current Outpatient Medications:    acetaminophen (TYLENOL) 325 MG tablet, Take 2 tablets (650 mg total) by mouth every 6 (six) hours as needed for mild pain (or Fever >/= 101)., Disp: , Rfl:    amLODipine (NORVASC) 5 MG tablet, TAKE 1 TABLET BY MOUTH ONCE A DAY, Disp: 90 tablet, Rfl: 3   atorvastatin (LIPITOR) 40 MG tablet, TAKE 1 TABLET BY MOUTH AT BEDTIME, Disp: 90 tablet, Rfl: 3   ELIQUIS 5 MG TABS tablet, TAKE 1 TABLET BY MOUTH TWICE A DAY, Disp: 180 tablet, Rfl: 3   JARDIANCE 25 MG TABS tablet, TAKE 1 TABLET BY MOUTH ONCE A DAY (Patient taking differently: Take 25 mg by mouth at bedtime.), Disp: 90 tablet, Rfl: 3   losartan (COZAAR) 50 MG tablet, TAKE 1 TABLET BY MOUTH ONCE A DAY (Patient taking differently: Take 50 mg by mouth at bedtime.), Disp: 90 tablet, Rfl: 3   metFORMIN (GLUCOPHAGE-XR) 500 MG 24 hr tablet, TAKE 4 TABLETS BY MOUTH ONCE DAILY WITH BREAKFAST., Disp: 360 tablet, Rfl: 3   metoprolol succinate (TOPROL-XL) 25 MG 24 hr tablet, TAKE 1 TABLET BY MOUTH ONCE A DAY (NEED OFFICE VISIT FOR FUTURE REFILLS), Disp: 30 tablet, Rfl: 0   mirabegron ER (MYRBETRIQ) 25 MG TB24 tablet, Take 1 tablet (25 mg total) by mouth daily., Disp: 30 tablet, Rfl: 3   nystatin ointment (MYCOSTATIN), APPLY TO   AFFECTED AREAS TWICE DAILY (Patient not taking: Reported on 01/11/2022), Disp: 30 g, Rfl: 3   pantoprazole (PROTONIX) 40 MG tablet, TAKE 1 TABLET BY MOUTH EVERY MORNING (Patient taking differently: Take 40 mg by mouth at bedtime.), Disp: 90 tablet, Rfl: 3   PARoxetine (PAXIL) 20 MG tablet, TAKE 1 TABLET BY MOUTH ONCE A DAY, Disp: 90 tablet, Rfl: 0   QUEtiapine (SEROQUEL) 50 MG tablet, TAKE 1 TABLET BY MOUTH DAILY AT BEDTIME, Disp: 30 tablet, Rfl: 1   Zinc 220 (50 Zn) MG CAPS, Take 220 mg by mouth daily for 20 days., Disp: 30 capsule, Rfl: 0  Past Medical Problems: Past Medical History:  Diagnosis Date   Anxiety    Breast cancer ([place]) 06/29/1995   AGE 57, BRCA 1 NEGATIVE  2. 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    CKD (chronic kidney disease), stage III (HCC)    Cognitive deficits    Diabetes mellitus    TYPE II   High cholesterol    Hypertension    PAF (paroxysmal atrial fibrillation) (HCC)     Past Surgical History: Past Surgical History:  Procedure Laterality Date   ABDOMINAL HYSTERECTOMY  1985   TAH   BREAST SURGERY  1997   RIGHT BREAST LUMPECTOMY   CHOLECYSTECTOMY  1980    Social History: Social History   Socioeconomic History   Marital status: Married    Spouse name: Not on file   Number of children: Not on file   Years of education: Not on file   Highest education level: Not on file  Occupational History   Not on file  Tobacco Use   Smoking status: Never   Smokeless tobacco: Never  Substance and Sexual Activity   Alcohol use: No   Drug use: No   Sexual activity: Yes    Birth control/protection: Post-menopausal, Surgical    Comment: HYST  Other Topics Concern   Not on file  Social History Narrative   Not on file   Social Determinants of Health   Financial Resource Strain: Not on file  Food Insecurity: Not on file  Transportation Needs: Not on file  Physical Activity: Not on file  Stress: Not on file  Social Connections: Not on file  Intimate Partner Violence: Not on file    Family History: Family History  Problem Relation Age of Onset   Cancer Mother        COLON   Hypertension Father    Heart disease Father     Review of Systems: Denies interim changes in her health   Assessment/Plan: The patient is scheduled for debridement of left foot wound with placement of myriad and wound VAC with Dr. Marla Roe.  Risks, benefits, and alternatives of procedure discussed, questions answered and consent obtained.    Smoking Status: Non-smoker; Counseling Given?  N/A  Caprini Score: 8; Risk Factors include: Age, history of previous malignancy, BMI > 25, and length of planned surgery. Recommendation for  mechanical prophylaxis. Encourage early ambulation. Patient may resume eliquis after surgery.   Pictures obtained: 01/09/2022  Post-op Rx sent to pharmacy: Oxycodone, Zofran, Keflex  Patient states she has taken oxycodone in the past and has not had any issues with it.  Discussed with the patient that she should hold her Jardiance until surgery.  Discussed with patient that she should continue to hold her Eliquis.  I have reached out to patient's primary care provider, Dr. Dennard Schaumann, in regards to a Lovenox bridge while patient is not taking  Eliquis.  Awaiting final response from him.  I discussed the general surgical risks with the patient over the phone.  All of the patient's questions were answered.  Recommended calling if they have any further questions.  The patient gave consent to have this visit done by telemedicine / virtual visit, two identifiers were used to identify patient. This is also consent for access the chart and treat the patient via this visit. The patient is located at home.  I, the provider, am at the office.  We spent approximately 20 minutes together for the visit.  Joined by telephone.    Electronically signed by:  E , PA-C 01/11/2022 8:36 PM  

## 2022-01-11 NOTE — Telephone Encounter (Signed)
Per pt called stated that she is having surgery [place], 7/19,  would like to know if she can stop her Elliquis  Please advice

## 2022-01-11 NOTE — H&P (View-Only) (Signed)
   Patient ID: Kathryn Wall Severe, female    DOB: 02/04/1946, 76 y.o.   MRN: [id number]  No chief complaint on file.   No diagnosis found.   History of Present Illness: Kathryn Wall is Wall 76 y.o.  female  with Wall history of left foot wound.  She presents for preoperative evaluation for upcoming procedure, debridement with myriad placement and wound vac placement, scheduled for 01/14/22 with Dr. Dillingham.  The patient has not had problems with anesthesia.  Patient reports she has Wall-fib and is on Eliquis.  She denies any other cardiac issues.  Patient reports she is not Wall smoker.  Patient denies being on any birth control or hormone replacement.  She reports she has had 1 miscarriage.  She denies any personal or family history of blood clots or clotting disease.  She denies any recent surgeries, traumas, infections, stroke, or heart attacks.  She does report she has Wall history of breast cancer.  She denies any varicose veins or swollen legs.  Summary of Previous Visit: Patient was seen in the clinic by Dr. Dillingham on 01/09/2022.  Patient had left foot surgery Wall while ago with Dr. Adair.  She developed some hematomas that were addressed.  She then had Wall splint to remove to which she was then found to have Wall large eschar on the dorsal aspect of the left foot.  Plan was to take her to the operating room for debridement with myriad placement and wound VAC.  Job: Retired  PMH Significant for: Wall-fib, type 2 diabetes, CKD, breast cancer  Patient's most recent A1c was 3 weeks ago.  It was 5.9.   Past Medical History: Allergies: Allergies  Allergen Reactions   Ambien [Zolpidem Tartrate] Other (See Comments)    Can not tolerate, causes sleepwalking   Cymbalta [Duloxetine Hcl] Other (See Comments)    Unknown Reaction   Other     SENSITIVE TO ANTIBIOTICS   Ciprofloxacin Nausea Only    Nausea    Doxazosin Nausea And Vomiting   Percocet [Oxycodone-Acetaminophen] Nausea And Vomiting     Current Medications:  Current Outpatient Medications:    acetaminophen (TYLENOL) 325 MG tablet, Take 2 tablets (650 mg total) by mouth every 6 (six) hours as needed for mild pain (or Fever >/= 101)., Disp: , Rfl:    amLODipine (NORVASC) 5 MG tablet, TAKE 1 TABLET BY MOUTH ONCE Wall DAY, Disp: 90 tablet, Rfl: 3   atorvastatin (LIPITOR) 40 MG tablet, TAKE 1 TABLET BY MOUTH AT BEDTIME, Disp: 90 tablet, Rfl: 3   ELIQUIS 5 MG TABS tablet, TAKE 1 TABLET BY MOUTH TWICE Wall DAY, Disp: 180 tablet, Rfl: 3   JARDIANCE 25 MG TABS tablet, TAKE 1 TABLET BY MOUTH ONCE Wall DAY (Patient taking differently: Take 25 mg by mouth at bedtime.), Disp: 90 tablet, Rfl: 3   losartan (COZAAR) 50 MG tablet, TAKE 1 TABLET BY MOUTH ONCE Wall DAY (Patient taking differently: Take 50 mg by mouth at bedtime.), Disp: 90 tablet, Rfl: 3   metFORMIN (GLUCOPHAGE-XR) 500 MG 24 hr tablet, TAKE 4 TABLETS BY MOUTH ONCE DAILY WITH BREAKFAST., Disp: 360 tablet, Rfl: 3   metoprolol succinate (TOPROL-XL) 25 MG 24 hr tablet, TAKE 1 TABLET BY MOUTH ONCE Wall DAY (NEED OFFICE VISIT FOR FUTURE REFILLS), Disp: 30 tablet, Rfl: 0   mirabegron ER (MYRBETRIQ) 25 MG TB24 tablet, Take 1 tablet (25 mg total) by mouth daily., Disp: 30 tablet, Rfl: 3   nystatin ointment (MYCOSTATIN), APPLY TO   AFFECTED AREAS TWICE DAILY (Patient not taking: Reported on 01/11/2022), Disp: 30 g, Rfl: 3   pantoprazole (PROTONIX) 40 MG tablet, TAKE 1 TABLET BY MOUTH EVERY MORNING (Patient taking differently: Take 40 mg by mouth at bedtime.), Disp: 90 tablet, Rfl: 3   PARoxetine (PAXIL) 20 MG tablet, TAKE 1 TABLET BY MOUTH ONCE Wall DAY, Disp: 90 tablet, Rfl: 0   QUEtiapine (SEROQUEL) 50 MG tablet, TAKE 1 TABLET BY MOUTH DAILY AT BEDTIME, Disp: 30 tablet, Rfl: 1   Zinc 220 (50 Zn) MG CAPS, Take 220 mg by mouth daily for 20 days., Disp: 30 capsule, Rfl: 0  Past Medical Problems: Past Medical History:  Diagnosis Date   Anxiety    Breast cancer ([place]) 06/29/1995   AGE 89, BRCA 1 NEGATIVE  2. 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    CKD (chronic kidney disease), stage III (HCC)    Cognitive deficits    Diabetes mellitus    TYPE II   High cholesterol    Hypertension    PAF (paroxysmal atrial fibrillation) (HCC)     Past Surgical History: Past Surgical History:  Procedure Laterality Date   ABDOMINAL HYSTERECTOMY  1985   TAH   BREAST SURGERY  1997   RIGHT BREAST LUMPECTOMY   CHOLECYSTECTOMY  1980    Social History: Social History   Socioeconomic History   Marital status: Married    Spouse name: Not on file   Number of children: Not on file   Years of education: Not on file   Highest education level: Not on file  Occupational History   Not on file  Tobacco Use   Smoking status: Never   Smokeless tobacco: Never  Substance and Sexual Activity   Alcohol use: No   Drug use: No   Sexual activity: Yes    Birth control/protection: Post-menopausal, Surgical    Comment: HYST  Other Topics Concern   Not on file  Social History Narrative   Not on file   Social Determinants of Health   Financial Resource Strain: Not on file  Food Insecurity: Not on file  Transportation Needs: Not on file  Physical Activity: Not on file  Stress: Not on file  Social Connections: Not on file  Intimate Partner Violence: Not on file    Family History: Family History  Problem Relation Age of Onset   Cancer Mother        COLON   Hypertension Father    Heart disease Father     Review of Systems: Denies interim changes in her health   Assessment/Plan: The patient is scheduled for debridement of left foot wound with placement of myriad and wound VAC with Dr. Marla Roe.  Risks, benefits, and alternatives of procedure discussed, questions answered and consent obtained.    Smoking Status: Non-smoker; Counseling Given?  N/Wall  Caprini Score: 8; Risk Factors include: Age, history of previous malignancy, BMI > 25, and length of planned surgery. Recommendation for  mechanical prophylaxis. Encourage early ambulation. Patient may resume eliquis after surgery.   Pictures obtained: 01/09/2022  Post-op Rx sent to pharmacy: Oxycodone, Zofran, Keflex  Patient states she has taken oxycodone in the past and has not had any issues with it.  Discussed with the patient that she should hold her Jardiance until surgery.  Discussed with patient that she should continue to hold her Eliquis.   I have reached out to patient's primary care provider, Dr. Dennard Schaumann, in regards to Wall Lovenox bridge while patient is not  taking Eliquis. Per Dr. Dennard Schaumann, patient does not need lovenox bridge and can resume eliquis after surgery.   I discussed the general surgical risks with the patient over the phone.  All of the patient's questions were answered.  Recommended calling if they have any further questions.  The patient gave consent to have this visit done by telemedicine / virtual visit, two identifiers were used to identify patient. This is also consent for access the chart and treat the patient via this visit. The patient is located at home.  I, the provider, am at the office.  We spent approximately 20 minutes together for the visit.  Joined by telephone.   I discussed the plan with Dr. Marla Roe   Electronically signed by: Clance Boll, PA-C 01/11/2022 8:36 PM

## 2022-01-11 NOTE — Addendum Note (Signed)
Addended by: Wallace Going on: 01/11/2022 06:40 PM   Modules accepted: Orders

## 2022-01-11 NOTE — Telephone Encounter (Signed)
LM regarding holding Elliquis before surgery on Wed. 7/19 per DPR we can leave MSG on her voice Mail

## 2022-01-12 ENCOUNTER — Other Ambulatory Visit: Payer: Self-pay | Admitting: Student

## 2022-01-12 ENCOUNTER — Institutional Professional Consult (permissible substitution): Payer: Medicare Other | Admitting: Plastic Surgery

## 2022-01-12 DIAGNOSIS — S91302D Unspecified open wound, left foot, subsequent encounter: Secondary | ICD-10-CM

## 2022-01-12 MED ORDER — CEPHALEXIN 500 MG PO CAPS: 500.0000 mg | ORAL_CAPSULE | Freq: Four times a day (QID) | ORAL | 0 refills | Status: AC

## 2022-01-12 MED ORDER — OXYCODONE HCL 5 MG PO TABS: 5.0000 mg | ORAL_TABLET | Freq: Three times a day (TID) | ORAL | 0 refills | Status: DC | PRN

## 2022-01-12 MED ORDER — ONDANSETRON HCL 4 MG PO TABS: 4.0000 mg | ORAL_TABLET | Freq: Three times a day (TID) | ORAL | 0 refills | Status: DC | PRN

## 2022-01-12 NOTE — Progress Notes (Signed)
Home health care has been ordered for the patient.  I called the patient and updated her.  Patient knowledge.  I discussed walking with the patient postoperatively.  I discussed with the patient that we will follow orthopedics recommendations for weightbearing status.  She states that orthopedics has been allowing her to bear weight on her left foot.  Discussed plan with Dr. Marla Roe

## 2022-01-13 ENCOUNTER — Other Ambulatory Visit: Payer: Self-pay | Admitting: Family Medicine

## 2022-01-13 ENCOUNTER — Telehealth: Payer: Self-pay

## 2022-01-13 ENCOUNTER — Encounter (HOSPITAL_COMMUNITY): Payer: Self-pay | Admitting: Plastic Surgery

## 2022-01-13 ENCOUNTER — Other Ambulatory Visit: Payer: Self-pay

## 2022-01-13 NOTE — Progress Notes (Signed)
Anesthesia Chart Review:  Pt is a same day work up   Case: [id number] Date/Time: 01/14/22 0915   Procedures:      APPLICATION OF WOUND VAC (Left)     APPLICATION OF SKIN SUBSTITUTE APPLICATION OF MYRAID (Left)   Anesthesia type: General   Pre-op diagnosis: OPEN WOUND OF LEFT FOOT   Location: [facility] OR ROOM 08 / [facility]   Surgeons: Wallace Going, DO       DISCUSSION: Pt is 76 years old with hx PAF, HTN, DM, CKD  Notes from cardiology indicate pt was briefly in afib during hospitalization July 2022 for encephalopathy, PNA, and diverticulitis. Afib thought due to illness and possibly untreated OSA. Pt has not pursued outpatient sleep study. No known recurrence of afib and event monitor November 2022 did not show afib.   Pt takes eliquis - to hold based on instructions from surgeon - looks like she was told to stop it 01/11/22 by notes  PROVIDERS: - PCP is Pickard, Cammie Mcgee, MD - Cardiologist is Gwyndolyn Kaufman, MD   LABS: Will be obtained day of surgery    IMAGES: CXR 02/04/21:  - No active cardiopulmonary disease.  EKG 04/29/21: NSR 70bpm, RSR pattern V1 similar to prior, nonspecific STTW changes    CV: Cardiac event monitor 06/19/21:  Patch wear time is 13 days and 20 hours Predominant rhythm was NSR with average HR 68bpm (ranging from 45-164bpm) 3 runs of brief, nonsustained SVT lasting 7 beats Rare SVE, rare PVCs No sustained arrhythmias or significant pauses.  Echo 01/02/21:  1. Left ventricular ejection fraction, by estimation, is 60 to 65%. The left ventricle has normal function. The left ventricle has no regional wall motion abnormalities. There is mild left ventricular hypertrophy. Left ventricular diastolic parameters were normal.  2. Right ventricular systolic function is normal. The right ventricular size is normal. There is normal pulmonary artery systolic pressure. The estimated right ventricular systolic pressure is 65.9 mmHg.  3. The mitral valve is normal in  structure. Trivial mitral valve regurgitation. No evidence of mitral stenosis.  4. The aortic valve is tricuspid. Aortic valve regurgitation is not visualized. Mild to moderate aortic valve sclerosis/calcification is present, without any evidence of aortic stenosis.  5. The inferior vena cava is dilated in size with >50% respiratory variability, suggesting right atrial pressure of 8 mmHg.    Past Medical History:  Diagnosis Date   Anxiety    Breast cancer ([place]) 06/29/1995   AGE 55, BRCA 1 NEGATIVE 2. 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    CKD (chronic kidney disease), stage III (HCC)    Cognitive deficits    Depression    Diabetes mellitus    TYPE II   High cholesterol    Hypertension    PAF (paroxysmal atrial fibrillation) ([place])     Past Surgical History:  Procedure Laterality Date   ABDOMINAL HYSTERECTOMY  1985   TAH   BREAST SURGERY  1997   RIGHT BREAST LUMPECTOMY   CHOLECYSTECTOMY  1980    MEDICATIONS: No current facility-administered medications for this encounter.    acetaminophen (TYLENOL) 325 MG tablet   amLODipine (NORVASC) 5 MG tablet   atorvastatin (LIPITOR) 40 MG tablet   ELIQUIS 5 MG TABS tablet   JARDIANCE 25 MG TABS tablet   losartan (COZAAR) 50 MG tablet   metFORMIN (GLUCOPHAGE-XR) 500 MG 24 hr tablet   metoprolol succinate (TOPROL-XL) 25 MG 24 hr tablet   mirabegron ER (MYRBETRIQ) 25 MG TB24  tablet   pantoprazole (PROTONIX) 40 MG tablet   PARoxetine (PAXIL) 20 MG tablet   QUEtiapine (SEROQUEL) 50 MG tablet   cephALEXin (KEFLEX) 500 MG capsule   nystatin ointment (MYCOSTATIN)   ondansetron (ZOFRAN) 4 MG tablet   oxyCODONE (ROXICODONE) 5 MG immediate release tablet   Zinc 220 (50 Zn) MG CAPS    If labs acceptable day of surgery, I anticipate pt can proceed with surgery as scheduled.  Willeen Cass, PhD, FNP-BC [facility]/Anesthesiology Phone: [phone] 01/13/2022 11:32 AM

## 2022-01-13 NOTE — Telephone Encounter (Signed)
-----   Message from Clance Boll, PA-C sent at 01/12/2022  1:43 PM EDT ----- Regarding: [place],  I am going to put home health orders in for this patient for wound VAC changes.  Can you all help me set this up for her?  She has surgery upcoming on Thursday I believe.  Thanks,  Lyndee Leo

## 2022-01-13 NOTE — Telephone Encounter (Signed)
L/M for patient to see if she is currently receiving home health for her foot or if she has in the past and with what facility if so.   Also, I spoke with Dawn at [facility] regarding wound vac. She will work on the order and Management consultant, [place] for an e-sig. She will check with her contact at [facility] to see if they have some in stock for tomorrow's surgery.

## 2022-01-13 NOTE — Progress Notes (Signed)
PCP - Dr Jenna Luo Cardiologist - Dr Gwyndolyn Kaufman   Chest x-ray - 02/04/21 (2V) EKG - 04/29/21 Stress Test - 01/22/09 ECHO - 01/02/21 Cardiac Cath - n/a  ICD Pacemaker/Loop - n/a  Sleep Study -  n/a CPAP - none  Diabetes Type 2 - Patient does not check her blood sugar.   Do not take Metformin on the day of surgery.  Hold Jardiance for 72 hours prior to procedure per anesthesia protocol.  Last dose of  Jardiance was on 01/10/22.  Blood Thinner Instructions:  Follow your surgeon's instructions on when to stop Eliquis prior to surgery.  Last dose was on  01/09/22  Per Dr. Dennard Schaumann, patient does not need lovenox bridge and can resume eliquis after surgery (see note 01/11/22).    Anesthesia review: Yes  STOP now taking any Aspirin (unless otherwise instructed by your surgeon), Aleve, Naproxen, Ibuprofen, Motrin, Advil, Goody's, BC's, all herbal medications, fish oil, and all vitamins.   Coronavirus Screening Do you have any of the following symptoms:  Cough yes/no: No Fever (>100.25F)  yes/no: No Runny nose yes/no: No Sore throat yes/no: No Difficulty breathing/shortness of breath  yes/no: No  Have you traveled in the last 14 days and where? yes/no: No  Daughter Tye [place] [phone] verbalized understanding of instructions that were given via phone.

## 2022-01-13 NOTE — Telephone Encounter (Signed)
L/M w/Dawn at [facility] inquiring about wound vac for patient.

## 2022-01-13 NOTE — Anesthesia Preprocedure Evaluation (Addendum)
Anesthesia Evaluation  Patient identified by MRN, date of birth, ID band Patient awake    Reviewed: Allergy & Precautions, NPO status , Patient's Chart, lab work & pertinent test results  Airway Mallampati: II  TM Distance: >3 FB     Dental   Pulmonary pneumonia,    breath sounds clear to auscultation       Cardiovascular hypertension,  Rhythm:Regular Rate:Normal     Neuro/Psych PSYCHIATRIC DISORDERS negative neurological ROS     GI/Hepatic Neg liver ROS, GERD  ,  Endo/Other  diabetes  Renal/GU Renal disease     Musculoskeletal   Abdominal   Peds  Hematology   Anesthesia Other Findings   Reproductive/Obstetrics                            Anesthesia Physical Anesthesia Plan  ASA: 3  Anesthesia Plan: General   Post-op Pain Management:    Induction: Intravenous  PONV Risk Score and Plan: 3 and Ondansetron and Dexamethasone  Airway Management Planned: Oral ETT  Additional Equipment:   Intra-op Plan:   Post-operative Plan: Extubation in OR  Informed Consent: I have reviewed the patients History and Physical, chart, labs and discussed the procedure including the risks, benefits and alternatives for the proposed anesthesia with the patient or authorized representative who has indicated his/her understanding and acceptance.     Dental advisory given  Plan Discussed with: CRNA  Anesthesia Plan Comments: (See APP note by Durel Salts, FNP )       Anesthesia Quick Evaluation

## 2022-01-14 ENCOUNTER — Ambulatory Visit (HOSPITAL_COMMUNITY)
Admission: RE | Admit: 2022-01-14 | Discharge: 2022-01-14 | Disposition: A | Discharge status: Home / Self Care | Payer: Medicare Other | Attending: Plastic Surgery | Admitting: Plastic Surgery

## 2022-01-14 ENCOUNTER — Ambulatory Visit (HOSPITAL_BASED_OUTPATIENT_CLINIC_OR_DEPARTMENT_OTHER): Payer: Medicare Other | Admitting: Emergency Medicine

## 2022-01-14 ENCOUNTER — Encounter (HOSPITAL_COMMUNITY)
Admission: RE | Disposition: A | Discharge status: Home / Self Care | Payer: Self-pay | Source: Home / Self Care | Attending: Plastic Surgery

## 2022-01-14 ENCOUNTER — Ambulatory Visit (HOSPITAL_COMMUNITY): Payer: Medicare Other | Admitting: Emergency Medicine

## 2022-01-14 ENCOUNTER — Encounter (HOSPITAL_COMMUNITY): Payer: Self-pay | Admitting: Plastic Surgery

## 2022-01-14 ENCOUNTER — Other Ambulatory Visit: Payer: Self-pay

## 2022-01-14 DIAGNOSIS — N289 Disorder of kidney and ureter, unspecified: Secondary | ICD-10-CM | POA: Diagnosis not present

## 2022-01-14 DIAGNOSIS — N1831 Chronic kidney disease, stage 3a: Secondary | ICD-10-CM | POA: Diagnosis not present

## 2022-01-14 DIAGNOSIS — I10 Essential (primary) hypertension: Secondary | ICD-10-CM | POA: Diagnosis not present

## 2022-01-14 DIAGNOSIS — S91302A Unspecified open wound, left foot, initial encounter: Secondary | ICD-10-CM

## 2022-01-14 DIAGNOSIS — Z7984 Long term (current) use of oral hypoglycemic drugs: Secondary | ICD-10-CM

## 2022-01-14 DIAGNOSIS — E1122 Type 2 diabetes mellitus with diabetic chronic kidney disease: Secondary | ICD-10-CM | POA: Diagnosis not present

## 2022-01-14 DIAGNOSIS — T148XXA Other injury of unspecified body region, initial encounter: Secondary | ICD-10-CM | POA: Diagnosis present

## 2022-01-14 DIAGNOSIS — X58XXXA Exposure to other specified factors, initial encounter: Secondary | ICD-10-CM | POA: Insufficient documentation

## 2022-01-14 DIAGNOSIS — E119 Type 2 diabetes mellitus without complications: Secondary | ICD-10-CM | POA: Diagnosis not present

## 2022-01-14 HISTORY — DX: Depression, unspecified: F32.A

## 2022-01-14 HISTORY — PX: APPLICATION OF WOUND VAC: SHX5189

## 2022-01-14 LAB — GLUCOSE, CAPILLARY
Glucose-Capillary: 123 mg/dL — ABNORMAL HIGH (ref 70–99)
Glucose-Capillary: 117 mg/dL — ABNORMAL HIGH (ref 70–99)
Glucose-Capillary: 131 mg/dL — ABNORMAL HIGH (ref 70–99)

## 2022-01-14 LAB — CBC
HCT: 36.1 % (ref 36.0–46.0)
Hemoglobin: 11 g/dL — ABNORMAL LOW (ref 12.0–15.0)
MCH: 27.7 pg (ref 26.0–34.0)
MCHC: 30.5 g/dL (ref 30.0–36.0)
MCV: 90.9 fL (ref 80.0–100.0)
Platelets: 316 10*3/uL (ref 150–400)
RBC: 3.97 MIL/uL (ref 3.87–5.11)
RDW: 15.2 % (ref 11.5–15.5)
WBC: 7.1 10*3/uL (ref 4.0–10.5)
nRBC: 0 % (ref 0.0–0.2)

## 2022-01-14 LAB — BASIC METABOLIC PANEL
Anion gap: 13 (ref 5–15)
BUN: 12 mg/dL (ref 8–23)
CO2: 21 mmol/L — ABNORMAL LOW (ref 22–32)
Calcium: 9.3 mg/dL (ref 8.9–10.3)
Chloride: 106 mmol/L (ref 98–111)
Creatinine, Ser: 1.16 mg/dL — ABNORMAL HIGH (ref 0.44–1.00)
GFR, Estimated: 48 mL/min — ABNORMAL LOW (ref >60)
Glucose, Bld: 107 mg/dL — ABNORMAL HIGH (ref 70–99)
Potassium: 4.3 mmol/L (ref 3.5–5.1)
Sodium: 140 mmol/L (ref 135–145)

## 2022-01-14 SURGERY — APPLICATION, WOUND VAC
Anesthesia: General | Site: Foot | Laterality: Left

## 2022-01-14 MED ORDER — EPHEDRINE SULFATE-NACL 50-0.9 MG/10ML-% IV SOSY
PREFILLED_SYRINGE | INTRAVENOUS | Status: DC | PRN
  Administered 2022-01-14 (×2): 10 mg via INTRAVENOUS

## 2022-01-14 MED ORDER — CHLORHEXIDINE GLUCONATE 0.12 % MT SOLN
15.0000 mL | Freq: Once | OROMUCOSAL | Status: AC
  Administered 2022-01-14: 15 mL via OROMUCOSAL
  Filled 2022-01-14: qty 15

## 2022-01-14 MED ORDER — PROPOFOL 10 MG/ML IV BOLUS
INTRAVENOUS | Status: AC
Start: 2022-01-14 — End: ?
  Filled 2022-01-14: qty 20

## 2022-01-14 MED ORDER — INSULIN ASPART 100 UNIT/ML IJ SOLN: 0.0000 [IU] | INTRAMUSCULAR | Status: DC | PRN

## 2022-01-14 MED ORDER — FENTANYL CITRATE (PF) 250 MCG/5ML IJ SOLN
INTRAMUSCULAR | Status: AC
  Filled 2022-01-14: qty 5

## 2022-01-14 MED ORDER — PHENYLEPHRINE 80 MCG/ML (10ML) SYRINGE FOR IV PUSH (FOR BLOOD PRESSURE SUPPORT)
PREFILLED_SYRINGE | INTRAVENOUS | Status: AC
Start: 2022-01-14 — End: ?
  Filled 2022-01-14: qty 10

## 2022-01-14 MED ORDER — DEXAMETHASONE SODIUM PHOSPHATE 10 MG/ML IJ SOLN
INTRAMUSCULAR | Status: AC
Start: 2022-01-14 — End: ?
  Filled 2022-01-14: qty 1

## 2022-01-14 MED ORDER — MIDAZOLAM HCL 5 MG/5ML IJ SOLN
INTRAMUSCULAR | Status: DC | PRN
  Administered 2022-01-14: 2 mg via INTRAVENOUS

## 2022-01-14 MED ORDER — LIDOCAINE 2% (20 MG/ML) 5 ML SYRINGE
INTRAMUSCULAR | Status: AC
  Filled 2022-01-14: qty 5

## 2022-01-14 MED ORDER — ORAL CARE MOUTH RINSE: 15.0000 mL | Freq: Once | OROMUCOSAL | Status: AC

## 2022-01-14 MED ORDER — SODIUM CHLORIDE 0.9% FLUSH: 3.0000 mL | INTRAVENOUS | Status: DC | PRN

## 2022-01-14 MED ORDER — EPHEDRINE 5 MG/ML INJ
INTRAVENOUS | Status: AC
Start: 2022-01-14 — End: ?
  Filled 2022-01-14: qty 5

## 2022-01-14 MED ORDER — LACTATED RINGERS IV SOLN: INTRAVENOUS | Status: DC

## 2022-01-14 MED ORDER — MIDAZOLAM HCL 2 MG/2ML IJ SOLN
INTRAMUSCULAR | Status: AC
  Filled 2022-01-14: qty 2

## 2022-01-14 MED ORDER — ONDANSETRON HCL 4 MG/2ML IJ SOLN
INTRAMUSCULAR | Status: DC | PRN
  Administered 2022-01-14: 4 mg via INTRAVENOUS

## 2022-01-14 MED ORDER — LIDOCAINE 2% (20 MG/ML) 5 ML SYRINGE
INTRAMUSCULAR | Status: DC | PRN
  Administered 2022-01-14: 100 mg via INTRAVENOUS

## 2022-01-14 MED ORDER — CHLORHEXIDINE GLUCONATE CLOTH 2 % EX PADS: 6.0000 | MEDICATED_PAD | Freq: Once | CUTANEOUS | Status: DC

## 2022-01-14 MED ORDER — PHENYLEPHRINE 80 MCG/ML (10ML) SYRINGE FOR IV PUSH (FOR BLOOD PRESSURE SUPPORT)
PREFILLED_SYRINGE | INTRAVENOUS | Status: DC | PRN
  Administered 2022-01-14: 160 ug via INTRAVENOUS

## 2022-01-14 MED ORDER — PROPOFOL 10 MG/ML IV BOLUS
INTRAVENOUS | Status: DC | PRN
  Administered 2022-01-14: 140 mg via INTRAVENOUS

## 2022-01-14 MED ORDER — EPHEDRINE 5 MG/ML INJ
INTRAVENOUS | Status: AC
  Filled 2022-01-14: qty 5

## 2022-01-14 MED ORDER — DEXAMETHASONE SODIUM PHOSPHATE 10 MG/ML IJ SOLN
INTRAMUSCULAR | Status: DC | PRN
  Administered 2022-01-14: 10 mg via INTRAVENOUS

## 2022-01-14 MED ORDER — ONDANSETRON HCL 4 MG/2ML IJ SOLN
INTRAMUSCULAR | Status: AC
  Filled 2022-01-14: qty 2

## 2022-01-14 MED ORDER — PHENYLEPHRINE 80 MCG/ML (10ML) SYRINGE FOR IV PUSH (FOR BLOOD PRESSURE SUPPORT)
PREFILLED_SYRINGE | INTRAVENOUS | Status: AC
  Filled 2022-01-14: qty 10

## 2022-01-14 MED ORDER — SODIUM CHLORIDE 0.9% FLUSH: 3.0000 mL | Freq: Two times a day (BID) | INTRAVENOUS | Status: DC

## 2022-01-14 MED ORDER — SODIUM CHLORIDE 0.9 % IV SOLN: 250.0000 mL | INTRAVENOUS | Status: DC | PRN

## 2022-01-14 MED ORDER — CEFAZOLIN SODIUM-DEXTROSE 2-4 GM/100ML-% IV SOLN
2.0000 g | INTRAVENOUS | Status: AC
  Administered 2022-01-14: 2 g via INTRAVENOUS
  Filled 2022-01-14: qty 100

## 2022-01-14 MED ORDER — FENTANYL CITRATE (PF) 100 MCG/2ML IJ SOLN: 25.0000 ug | INTRAMUSCULAR | Status: DC | PRN

## 2022-01-14 SURGICAL SUPPLY — 38 items
BAG COUNTER SPONGE SURGICOUNT (BAG) ×1 IMPLANT
BAG SPNG CNTER NS LX DISP (BAG)
BNDG ELASTIC 4X5.8 VLCR STR LF (GAUZE/BANDAGES/DRESSINGS) ×1 IMPLANT
BNDG GAUZE DERMACEA FLUFF (GAUZE/BANDAGES/DRESSINGS) ×1
BNDG GAUZE DERMACEA FLUFF 4 (GAUZE/BANDAGES/DRESSINGS) IMPLANT
BNDG GZE DERMACEA 4 6PLY (GAUZE/BANDAGES/DRESSINGS) ×1
CANISTER WOUND CARE 500ML ATS (WOUND CARE) ×1 IMPLANT
COVER SURGICAL LIGHT HANDLE (MISCELLANEOUS) ×2 IMPLANT
DRAPE INCISE IOBAN 66X45 STRL (DRAPES) ×1 IMPLANT
DRSG ADAPTIC 3X8 NADH LF (GAUZE/BANDAGES/DRESSINGS) IMPLANT
DRSG CUTIMED SORBACT 7X9 (GAUZE/BANDAGES/DRESSINGS) ×2 IMPLANT
DRSG PAD ABDOMINAL 8X10 ST (GAUZE/BANDAGES/DRESSINGS) ×1 IMPLANT
DRSG VAC ATS LRG SENSATRAC (GAUZE/BANDAGES/DRESSINGS) IMPLANT
DRSG VAC ATS MED SENSATRAC (GAUZE/BANDAGES/DRESSINGS) IMPLANT
DRSG VAC ATS SM SENSATRAC (GAUZE/BANDAGES/DRESSINGS) ×1 IMPLANT
ELECT CAUTERY BLADE 6.4 (BLADE) ×1 IMPLANT
ELECT REM PT RETURN 9FT ADLT (ELECTROSURGICAL) ×2
ELECTRODE REM PT RTRN 9FT ADLT (ELECTROSURGICAL) IMPLANT
GAUZE SPONGE 4X4 12PLY STRL (GAUZE/BANDAGES/DRESSINGS) ×2 IMPLANT
GAUZE SPONGE 4X4 12PLY STRL LF (GAUZE/BANDAGES/DRESSINGS) ×1 IMPLANT
GEL ULTRASOUND 20GR AQUASONIC (MISCELLANEOUS) ×1 IMPLANT
GLOVE BIO SURGEON STRL SZ 6.5 (GLOVE) ×3 IMPLANT
GOWN STRL REUS W/ TWL LRG LVL3 (GOWN DISPOSABLE) ×3 IMPLANT
GOWN STRL REUS W/TWL LRG LVL3 (GOWN DISPOSABLE) ×8
GRAFT MYRIAD 3 LAYER 5X5 (Graft) ×1 IMPLANT
KIT BASIN OR (CUSTOM PROCEDURE TRAY) ×2 IMPLANT
PACK GENERAL/GYN (CUSTOM PROCEDURE TRAY) ×2 IMPLANT
POWDER MYRIAD MORCELLS 500MG (Miscellaneous) ×1 IMPLANT
STAPLER VISISTAT 35W (STAPLE) IMPLANT
STOCKINETTE IMPERVIOUS 9X36 MD (GAUZE/BANDAGES/DRESSINGS) IMPLANT
STOCKINETTE IMPERVIOUS LG (DRAPES) IMPLANT
SURGILUBE 2OZ TUBE FLIPTOP (MISCELLANEOUS) ×1 IMPLANT
SUT SILK 4 0 P 3 (SUTURE) ×1 IMPLANT
SUT VIC AB 5-0 PS2 18 (SUTURE) ×3 IMPLANT
TOWEL GREEN STERILE (TOWEL DISPOSABLE) ×2 IMPLANT
TOWEL GREEN STERILE FF (TOWEL DISPOSABLE) IMPLANT
TUBE CONNECTING 12X1/4 (SUCTIONS) ×1 IMPLANT
YANKAUER SUCT BULB TIP NO VENT (SUCTIONS) ×1 IMPLANT

## 2022-01-14 NOTE — Telephone Encounter (Signed)
Patient called, left VM to return the call to the office to scheduled an appt for medication refill request.   

## 2022-01-14 NOTE — Anesthesia Postprocedure Evaluation (Signed)
Anesthesia Post Note  Patient: Kathryn Wall  Procedure(s) Performed: APPLICATION OF WOUND VAC (Left: Foot) APPLICATION OF SKIN SUBSTITUTE APPLICATION OF MYRAID (Left: Foot)     Patient location during evaluation: PACU Anesthesia Type: General Level of consciousness: awake Pain management: pain level controlled Vital Signs Assessment: post-procedure vital signs reviewed and stable Respiratory status: spontaneous breathing Cardiovascular status: stable Postop Assessment: no apparent nausea or vomiting Anesthetic complications: no   No notable events documented.  Last Vitals:  Vitals:   01/14/22 0716 01/14/22 1020  BP: (!) 156/96 (!) 155/93  Pulse: (!) 57 61  Resp: 18 14  Temp: 36.8 C 36.7 C  SpO2: 95% 97%    Last Pain:  Vitals:   01/14/22 1020  TempSrc:   PainSc: 0-No pain                 Rubens Cranston

## 2022-01-14 NOTE — Anesthesia Procedure Notes (Signed)
Procedure Name: LMA Insertion Date/Time: 01/14/2022 9:40 AM  Performed by: Moshe Salisbury, CRNAPre-anesthesia Checklist: Patient identified, Emergency Drugs available, Suction available and Patient being monitored Patient Re-evaluated:Patient Re-evaluated prior to induction Oxygen Delivery Method: Circle System Utilized Preoxygenation: Pre-oxygenation with 100% oxygen Induction Type: IV induction Ventilation: Mask ventilation without difficulty LMA: LMA inserted LMA Size: 4.0 Number of attempts: 1 Placement Confirmation: positive ETCO2 Tube secured with: Tape Dental Injury: Teeth and Oropharynx as per pre-operative assessment

## 2022-01-14 NOTE — Transfer of Care (Signed)
Immediate Anesthesia Transfer of Care Note  Patient: Kathryn Wall  Procedure(s) Performed: APPLICATION OF WOUND VAC (Left) APPLICATION OF SKIN SUBSTITUTE APPLICATION OF MYRAID (Left)  Patient Location: PACU  Anesthesia Type:General  Level of Consciousness: drowsy and patient cooperative  Airway & Oxygen Therapy: Patient Spontanous Breathing and Patient connected to nasal cannula oxygen  Post-op Assessment: Report given to RN, Post -op Vital signs reviewed and stable and Patient moving all extremities  Post vital signs: Reviewed and stable  Last Vitals:  Vitals Value Taken Time  BP 155/93 01/14/22 1017  Temp    Pulse 61 01/14/22 1020  Resp 14 01/14/22 1020  SpO2 97 % 01/14/22 1020  Vitals shown include unvalidated device data.  Last Pain:  Vitals:   01/14/22 0726  TempSrc:   PainSc: 0-No pain      Patients Stated Pain Goal: 0 (69/79/48 0165)  Complications: No notable events documented.

## 2022-01-14 NOTE — Interval H&P Note (Signed)
History and Physical Interval Note:  01/14/2022 8:08 AM  Kathryn Wall  has presented today for surgery, with the diagnosis of OPEN WOUND OF LEFT FOOT.  The various methods of treatment have been discussed with the patient and family. After consideration of risks, benefits and other options for treatment, the patient has consented to  Procedure(s): APPLICATION OF WOUND VAC (Left) APPLICATION OF SKIN SUBSTITUTE APPLICATION OF MYRAID (Left) as a surgical intervention.  The patient's history has been reviewed, patient examined, no change in status, stable for surgery.  I have reviewed the patient's chart and labs.  Questions were answered to the patient's satisfaction.     [name] Larue Lightner

## 2022-01-14 NOTE — Interval H&P Note (Signed)
History and Physical Interval Note:  01/14/2022 8:08 AM  Kathryn Wall  has presented today for surgery, with the diagnosis of OPEN WOUND OF LEFT FOOT.  The various methods of treatment have been discussed with the patient and family. After consideration of risks, benefits and other options for treatment, the patient has consented to  Procedure(s): APPLICATION OF WOUND VAC (Left) APPLICATION OF SKIN SUBSTITUTE APPLICATION OF MYRAID (Left) as a surgical intervention.  The patient's history has been reviewed, patient examined, no change in status, stable for surgery.  I have reviewed the patient's chart and labs.  Questions were answered to the patient's satisfaction.     [name] [name]

## 2022-01-14 NOTE — Op Note (Addendum)
DATE OF OPERATION: 01/14/2022  LOCATION: [facility]  PREOPERATIVE DIAGNOSIS: Left foot wound  POSTOPERATIVE DIAGNOSIS: Same  PROCEDURE:  Excision of left foot wound 4 x 6 cm skin, soft tissue and muscle. Placement of Myriad powder 500 mg and sheet 5 x 5 cm Placement of VAC  SURGEON: Danayah Smyre H. J. Heinz, DO  ASSISTANT: Merry Proud Hedges, PA  EBL: 1 cc  CONDITION: Stable  COMPLICATIONS: None  INDICATION: The patient, Kathryn Wall, is a 76 y.o. female born on 09-15-1945, is here for treatment of a chronic left foot wound after surgery for a foot injury.   PROCEDURE DETAILS:  The patient was seen prior to surgery and marked.  The IV antibiotics were given. The patient was taken to the operating room and given a general anesthetic. A standard time out was performed and all information was confirmed by those in the room. SCD was placed on the right leg.  The left leg was prepped and draped.  The wound was irrigated with saline.  The #10 blade was used to excise the escar, skin, soft tissue and muscle of the 4 x 6 cm wound.  Irrigation was done again.  There was no bone exposed.  All of the myriad powder and sheet was applied to the 4 x 6 cm wound.  The sheet was secured with the 5-0 Vicryl.  The sorbact was sutured in place with the 5-0 Vicryl.  KY gel and the vac was applied.  There was an excellent seal.  The patient was allowed to wake up and taken to recovery room in stable condition at the end of the case. The family was notified at the end of the case.   The advanced practice practitioner (APP) assisted throughout the case.  The APP was essential in retraction and counter traction when needed to make the case progress smoothly.  This retraction and assistance made it possible to see the tissue plans for the procedure.  The assistance was needed for blood control, tissue re-approximation and assisted with closure of the incision site.

## 2022-01-14 NOTE — Telephone Encounter (Signed)
Requested medication (s) are due for refill today: yes  Requested medication (s) are on the active medication list: yes  Last refill:  10/22/21 #90/3  Future visit scheduled: no  Notes to clinic:  Unable to refill per protocol, appointment needed.      Requested Prescriptions  Pending Prescriptions Disp Refills   atorvastatin (LIPITOR) 40 MG tablet [Pharmacy Med Name: ATORVASTATIN CALCIUM 40 MG TAB] 30 tablet     Sig: TAKE 1 TABLET BY MOUTH ONCE A DAY     Cardiovascular:  Antilipid - Statins Failed - 01/13/2022  8:31 AM      Failed - Valid encounter within last 12 months    Recent Outpatient Visits           11 months ago Type 2 diabetes mellitus with hyperlipidemia ([place])   [place] Pickard, Cammie Mcgee, MD   1 year ago Bibasilar crackles   [place] [name], MD   1 year ago Type 2 diabetes mellitus with hyperlipidemia ([place])   [place] [name], MD   2 years ago Type 2 diabetes mellitus with hyperlipidemia ([place])   [place] Pickard, Cammie Mcgee, MD   3 years ago Carpal tunnel syndrome of right wrist   [place] [name], MD              Failed - Lipid Panel in normal range within the last 12 months    Cholesterol  Date Value Ref Range Status  12/15/2021 136 <200 mg/dL Final   LDL Cholesterol (Calc)  Date Value Ref Range Status  12/15/2021 61 mg/dL (calc) Final    Comment:    Reference range: <100 . Desirable range <100 mg/dL for primary prevention;   <70 mg/dL for patients with CHD or diabetic patients  with > or = 2 CHD risk factors. Marland Kitchen LDL-C is now calculated using the Martin-Hopkins  calculation, which is a validated novel method providing  better accuracy than the Friedewald equation in the  estimation of LDL-C.  Cresenciano Genre et al. Annamaria Helling. 6010;932(35): 2061-2068  (http://education.[employer].com/faq/FAQ164)    Direct LDL  Date Value  Ref Range Status  11/22/2019 62 <100 mg/dL Final    Comment:    Greatly elevated Triglycerides values (>1200 mg/dL) interfere with the dLDL assay. As no Triglycerides  testing was ordered, interpret results with caution. . Desirable range <100 mg/dL for primary prevention;   <70 mg/dL for patients with CHD or diabetic patients  with > or = 2 CHD risk factors. Marland Kitchen    HDL  Date Value Ref Range Status  12/15/2021 48 (L) > OR = 50 mg/dL Final   Triglycerides  Date Value Ref Range Status  12/15/2021 195 (H) <150 mg/dL Final         Passed - Patient is not pregnant

## 2022-01-14 NOTE — Discharge Instructions (Addendum)
Wound Care   Guide to Wound Care  Proper wound care may reduce the risk of infection, improve healing rates, and limit scarring.  This is a general guide to help care for and manage wounds treated with product wound matrix.   Dressing Changes The frequency of dressing changes can vary based on which product was applied, the size of the wound, or the amount of wound drainage. Dressing inspections are recommended, at least weekly.   If you have a Wound VAC it will be changed in one week after the first time it is applied.  Then it will be changed once or twice a week.   If you don't have a Wound VAC, then place KY gel on the wound daily and cover with gauze.  Dressing Types Primary Dressing:  Non-adherent dressing goes directly over wounds being treated with the powder or sheet.  Secondary Dressing:  Secures the primary dressing in place and provides extra protection, compression, and absorption.  1. Wash Hands - To help decrease the risk of infection, caregivers should wash their hands for a minimum of 20 seconds and may use medical gloves.   2. Remove the Dressings - Avoid removing product from the wound by carefully removing the applicable dressing(s) at the time points recommended above, or as recommended by the treating physician.  Expected Color and Odor:  It is entirely normal for the wound to have an unpleasant odor and to form a caramel-colored gel as the product absorbs into the wound. It is important to leave this gel on the wound site.  3. Clean the Wound - Use clean water or saline to gently rinse around the wound surface and remove any excess discharge that may be present on the wound. Do not wipe off any of the caramel-colored gel on the wound.   What to look out for:  Large or increased amount of drainage   Surrounding skin has worsening redness or hot to touch   Increased pain in or around the wound   Flu-like symptoms, fatigue, decreased appetite, fever   Hard, crusty wound  surface with black or brown coloring  4. Apply New Dressings - Dressings should cover the entire wound and be suitable for maintaining a moist wound environment.  The non-adherent mesh dressing should be left in place.  New dressing should consist of KY Jelly to keep the wound moist and soft gauze secured with a wrap or tape.   Maintain a Hydrated Wound Area It is important to keep the wound area moist throughout the healing process. If the wound appears to be dry during dressing changes, select a dressing that will hydrate the wound and maintain that ideal moist environment. If you are unsure what to do, ask the treating physician.  Remodeling Process Every patient heals differently, and no two cases are the same. The size and location of the wound, product type and layering configurations, and general patient health all contribute to how quickly a wound will heal.  While many factors can influence the rate at which the product absorbs, the following can be used as a general guide.   THINGS TO DO: Refrain from smoking High protein diet with plenty of vegetables and some fruit  Limit simple processed carbohydrates and sugar Protect the wound from trauma Protect the dressing  powder       Sheet            Sorbact dressing

## 2022-01-15 ENCOUNTER — Encounter (HOSPITAL_COMMUNITY): Payer: Self-pay | Admitting: Plastic Surgery

## 2022-01-18 ENCOUNTER — Telehealth: Payer: Self-pay | Admitting: Plastic Surgery

## 2022-01-18 ENCOUNTER — Encounter (HOSPITAL_COMMUNITY): Payer: Self-pay | Admitting: Plastic Surgery

## 2022-01-18 NOTE — Telephone Encounter (Signed)
Received call from [place] with [facility] regarding referral they received for this patient; states they're unable to service the patient due to a staffing shortage. Cheryl recommended contacting the PCP to place a referral with the hopes the request will be expedited.  Please advise patient at [phone].

## 2022-01-20 NOTE — Telephone Encounter (Signed)
Received call from [place] at [facility] to follow up on request they received for home health for the patient; they are unable to accept the patient since her insurance is out of network. If you need additional information, please contact Erasmo Downer at [phone].

## 2022-01-20 NOTE — Telephone Encounter (Signed)
[facility] is unable to help.  Sent fax requesting [place] to [facility] with confirmed receipt.

## 2022-01-21 NOTE — Telephone Encounter (Signed)
Faxed [place] request to [facility].

## 2022-01-22 ENCOUNTER — Ambulatory Visit (INDEPENDENT_AMBULATORY_CARE_PROVIDER_SITE_OTHER): Payer: Medicare Other | Admitting: Physician Assistant

## 2022-01-22 DIAGNOSIS — S91302A Unspecified open wound, left foot, initial encounter: Secondary | ICD-10-CM

## 2022-01-22 DIAGNOSIS — S91302D Unspecified open wound, left foot, subsequent encounter: Secondary | ICD-10-CM

## 2022-01-22 NOTE — Progress Notes (Signed)
This is a pleasant 76 year old female seen in our office for follow-up evaluation and wound VAC change status post excision of left foot wound with placement of myriad powder and placement of VAC by Dr. Marla Roe on 01/14/2022.  Patient notes she has been doing well since that time.  The wound VAC has been functioning appropriately.  She denies any significant pain.  She does note that the wound care people have not come out to her house yet.   On exam the wound VAC was removed prior to my evaluation, her Sorbact dressing intact, no surrounding redness or warmth to touch.   I did change her wound VAC dressing by applying K-Y jelly followed by foam and VAC.  The patient will follow-up in our office on Tuesday if wound care has not seen her by that time.    Patient was given strict return precautions, she verbalized understanding and agreement to today's plan had no further questions or concerns at today's visit.

## 2022-01-26 ENCOUNTER — Encounter (HOSPITAL_BASED_OUTPATIENT_CLINIC_OR_DEPARTMENT_OTHER): Payer: Self-pay | Admitting: Emergency Medicine

## 2022-01-26 ENCOUNTER — Emergency Department (HOSPITAL_BASED_OUTPATIENT_CLINIC_OR_DEPARTMENT_OTHER): Payer: Medicare Other

## 2022-01-26 ENCOUNTER — Encounter: Payer: Medicare Other | Admitting: Plastic Surgery

## 2022-01-26 ENCOUNTER — Other Ambulatory Visit: Payer: Self-pay

## 2022-01-26 ENCOUNTER — Ambulatory Visit: Payer: Medicare Other | Admitting: Physician Assistant

## 2022-01-26 ENCOUNTER — Emergency Department (HOSPITAL_BASED_OUTPATIENT_CLINIC_OR_DEPARTMENT_OTHER)
Admission: EM | Admit: 2022-01-26 | Discharge: 2022-01-27 | Disposition: A | Discharge status: Home / Self Care | Payer: Medicare Other | Attending: Emergency Medicine | Admitting: Emergency Medicine

## 2022-01-26 DIAGNOSIS — I129 Hypertensive chronic kidney disease with stage 1 through stage 4 chronic kidney disease, or unspecified chronic kidney disease: Secondary | ICD-10-CM | POA: Insufficient documentation

## 2022-01-26 DIAGNOSIS — Z79899 Other long term (current) drug therapy: Secondary | ICD-10-CM | POA: Insufficient documentation

## 2022-01-26 DIAGNOSIS — E119 Type 2 diabetes mellitus without complications: Secondary | ICD-10-CM | POA: Diagnosis not present

## 2022-01-26 DIAGNOSIS — R1010 Upper abdominal pain, unspecified: Secondary | ICD-10-CM | POA: Insufficient documentation

## 2022-01-26 DIAGNOSIS — R112 Nausea with vomiting, unspecified: Secondary | ICD-10-CM | POA: Diagnosis not present

## 2022-01-26 DIAGNOSIS — R531 Weakness: Secondary | ICD-10-CM | POA: Insufficient documentation

## 2022-01-26 DIAGNOSIS — N189 Chronic kidney disease, unspecified: Secondary | ICD-10-CM | POA: Diagnosis not present

## 2022-01-26 DIAGNOSIS — Z7984 Long term (current) use of oral hypoglycemic drugs: Secondary | ICD-10-CM | POA: Insufficient documentation

## 2022-01-26 DIAGNOSIS — Z7901 Long term (current) use of anticoagulants: Secondary | ICD-10-CM | POA: Insufficient documentation

## 2022-01-26 DIAGNOSIS — Z853 Personal history of malignant neoplasm of breast: Secondary | ICD-10-CM | POA: Insufficient documentation

## 2022-01-26 DIAGNOSIS — R197 Diarrhea, unspecified: Secondary | ICD-10-CM | POA: Diagnosis not present

## 2022-01-26 LAB — LIPASE, BLOOD: Lipase: 44 U/L (ref 11–51)

## 2022-01-26 LAB — COMPREHENSIVE METABOLIC PANEL
ALT: 13 U/L (ref 0–44)
AST: 20 U/L (ref 15–41)
Albumin: 4.5 g/dL (ref 3.5–5.0)
Alkaline Phosphatase: 83 U/L (ref 38–126)
Anion gap: 12 (ref 5–15)
BUN: 12 mg/dL (ref 8–23)
CO2: 23 mmol/L (ref 22–32)
Calcium: 9.6 mg/dL (ref 8.9–10.3)
Chloride: 103 mmol/L (ref 98–111)
Creatinine, Ser: 1.14 mg/dL — ABNORMAL HIGH (ref 0.44–1.00)
GFR, Estimated: 49 mL/min — ABNORMAL LOW (ref >60)
Glucose, Bld: 117 mg/dL — ABNORMAL HIGH (ref 70–99)
Potassium: 4.1 mmol/L (ref 3.5–5.1)
Sodium: 138 mmol/L (ref 135–145)
Total Bilirubin: 0.7 mg/dL (ref 0.3–1.2)
Total Protein: 7.3 g/dL (ref 6.5–8.1)

## 2022-01-26 MED ORDER — MORPHINE SULFATE (PF) 4 MG/ML IV SOLN
4.0000 mg | Freq: Once | INTRAVENOUS | Status: AC
  Administered 2022-01-26: 4 mg via INTRAVENOUS
  Filled 2022-01-26: qty 1

## 2022-01-26 MED ORDER — SODIUM CHLORIDE 0.9 % IV SOLN
INTRAVENOUS | Status: DC
Start: 2022-01-26 — End: 2022-01-27

## 2022-01-26 MED ORDER — IOHEXOL 300 MG/ML  SOLN
100.0000 mL | Freq: Once | INTRAMUSCULAR | Status: AC | PRN
  Administered 2022-01-26: 80 mL via INTRAVENOUS

## 2022-01-26 MED ORDER — ONDANSETRON HCL 4 MG/2ML IJ SOLN
4.0000 mg | Freq: Once | INTRAMUSCULAR | Status: AC
  Administered 2022-01-26: 4 mg via INTRAVENOUS
  Filled 2022-01-26: qty 2

## 2022-01-26 MED ORDER — SODIUM CHLORIDE 0.9 % IV BOLUS
1000.0000 mL | Freq: Once | INTRAVENOUS | Status: AC
  Administered 2022-01-26: 1000 mL via INTRAVENOUS

## 2022-01-26 NOTE — ED Triage Notes (Signed)
Pt via ems from home with nausea x 3 days and emesis today. Pt was supposed to see doctor for wound vac today but didn't feel up to going. Pt states she feels poorly today. Pt alert & oriented, nad noted.

## 2022-01-26 NOTE — ED Provider Notes (Signed)
[place] EMERGENCY DEPT Provider Note   CSN: [id number] Arrival date & time: 01/26/22  2142     History {Add pertinent medical, surgical, social history, OB history to HPI:1} Chief Complaint  Patient presents with   Emesis    Kathryn Wall is a 76 y.o. female.   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  Patient states the last couple days she started experiencing nausea and vomiting.  Today she had multiple episodes and has not been able to keep anything down.  She has been feeling weak.  She measured her temperature at home of 101.  She is having pain in her upper abdomen.  She had some diarrhea but no constipation.  She has had prior abdominal surgeries including hysterectomy cholecystectomy and appendectomy    Home Medications Prior to Admission medications   Medication Sig Start Date End Date Taking? Authorizing Provider  acetaminophen (TYLENOL) 325 MG tablet Take 2 tablets (650 mg total) by mouth every 6 (six) hours as needed for mild pain (or Fever >/= 101). 01/05/21  Yes Samtani, Jai-Gurmukh, MD  amLODipine (NORVASC) 5 MG tablet TAKE 1 TABLET BY MOUTH ONCE A DAY 05/04/21  Yes [name], MD  atorvastatin (LIPITOR) 40 MG tablet TAKE 1 TABLET BY MOUTH ONCE A DAY 01/14/22  Yes Pickard, Cammie Mcgee, MD  ELIQUIS 5 MG TABS tablet TAKE 1 TABLET BY MOUTH TWICE A DAY 05/04/21  Yes Pickard, Cammie Mcgee, MD  JARDIANCE 25 MG TABS tablet TAKE 1 TABLET BY MOUTH ONCE A DAY Patient taking differently: Take 25 mg by mouth at bedtime. 05/04/21  Yes [name], MD  losartan (COZAAR) 50 MG tablet TAKE 1 TABLET BY MOUTH ONCE A DAY Patient taking differently: Take 50 mg by mouth at bedtime. 09/30/21  Yes [name], MD  metFORMIN (GLUCOPHAGE-XR) 500 MG 24 hr tablet TAKE 4 TABLETS BY MOUTH ONCE DAILY WITH  BREAKFAST. 05/04/21  Yes [name], MD  metoprolol succinate (TOPROL-XL) 25 MG 24 hr tablet TAKE 1 TABLET BY MOUTH ONCE A DAY (NEED OFFICE VISIT FOR FUTURE REFILLS) 01/07/22  Yes [name], MD  mirabegron ER (MYRBETRIQ) 25 MG TB24 tablet Take 1 tablet (25 mg total) by mouth daily. 12/15/21  Yes [name], MD  nystatin ointment (MYCOSTATIN) APPLY TO AFFECTED AREAS TWICE DAILY 11/10/20  Yes [name], MD  ondansetron (ZOFRAN) 4 MG tablet Take 1 tablet (4 mg total) by mouth every 8 (eight) hours as needed for up to 20 doses for nausea or vomiting. 01/12/22  Yes Clance Boll, PA-C  oxyCODONE (ROXICODONE) 5 MG immediate release tablet Take 1 tablet (5 mg total) by mouth every 8 (eight) hours as needed for up to 20 doses for severe pain. 01/12/22  Yes Clance Boll, PA-C  pantoprazole (PROTONIX) 40 MG tablet TAKE 1 TABLET BY MOUTH EVERY MORNING Patient taking differently: Take 40 mg by mouth at bedtime. 07/01/21  Yes [name], MD  PARoxetine (PAXIL) 20 MG tablet TAKE 1 TABLET BY MOUTH ONCE A DAY 10/20/21  Yes [name], MD  QUEtiapine (SEROQUEL) 50 MG tablet TAKE 1 TABLET BY MOUTH DAILY AT BEDTIME 12/30/21  Yes [name], MD  Zinc 220 (50 Zn) MG CAPS Take 220 mg by mouth daily for 20 days. 01/11/22 01/31/22  Dillingham, [name], DO  Allergies    Ambien [zolpidem tartrate], Cymbalta [duloxetine hcl], Other, Ciprofloxacin, Doxazosin, and Percocet [oxycodone-acetaminophen]    Review of Systems   Review of Systems  Gastrointestinal:  Positive for vomiting.    Physical Exam Updated Vital Signs BP (!) 173/114 (BP Location: Left Arm)   Pulse 98   Temp 98.1 F (36.7 C) (Oral)   Resp 18   Ht 1.651 m ('5\' 5"'$ )   Wt 68.9 kg   SpO2 100%   BMI 25.29 kg/m  Physical Exam Vitals and nursing note reviewed.  Constitutional:      Appearance: She is well-developed. She is not diaphoretic.  HENT:     Head: Normocephalic and atraumatic.     Right  Ear: External ear normal.     Left Ear: External ear normal.  Eyes:     General: No scleral icterus.       Right eye: No discharge.        Left eye: No discharge.     Conjunctiva/sclera: Conjunctivae normal.  Neck:     Trachea: No tracheal deviation.  Cardiovascular:     Rate and Rhythm: Normal rate and regular rhythm.  Pulmonary:     Effort: Pulmonary effort is normal. No respiratory distress.     Breath sounds: Normal breath sounds. No stridor. No wheezing or rales.  Abdominal:     General: Bowel sounds are decreased. There is no distension.     Palpations: Abdomen is soft.     Tenderness: There is abdominal tenderness in the epigastric area. There is no guarding or rebound.  Musculoskeletal:        General: No tenderness or deformity.     Cervical back: Neck supple.  Skin:    General: Skin is warm and dry.     Findings: No rash.  Neurological:     General: No focal deficit present.     Mental Status: She is alert.     Cranial Nerves: No cranial nerve deficit (no facial droop, extraocular movements intact, no slurred speech).     Sensory: No sensory deficit.     Motor: No abnormal muscle tone or seizure activity.     Coordination: Coordination normal.  Psychiatric:        Mood and Affect: Mood normal.     ED Results / Procedures / Treatments   Labs (all labs ordered are listed, but only abnormal results are displayed) Labs Reviewed  COMPREHENSIVE METABOLIC PANEL  LIPASE, BLOOD  CBC WITH DIFFERENTIAL/PLATELET  URINALYSIS, ROUTINE W REFLEX MICROSCOPIC    EKG None  Radiology No results found.  Procedures Procedures  {Document cardiac monitor, telemetry assessment procedure when appropriate:1}  Medications Ordered in ED Medications  sodium chloride 0.9 % bolus 1,000 mL (has no administration in time range)    And  0.9 %  sodium chloride infusion (has no administration in time range)  ondansetron (ZOFRAN) injection 4 mg (has no administration in time range)   morphine (PF) 4 MG/ML injection 4 mg (has no administration in time range)    ED Course/ Medical Decision Making/ A&P                           Medical Decision Making Amount and/or Complexity of Data Reviewed Labs: ordered.  Risk Prescription drug management.   ***  {Document critical care time when appropriate:1} {Document review of labs and clinical decision tools ie heart score, Chads2Vasc2 etc:1}  {Document your independent review of radiology  images, and any outside records:1} {Document your discussion with family members, caretakers, and with consultants:1} {Document social determinants of health affecting pt's care:1} {Document your decision making why or why not admission, treatments were needed:1} Final Clinical Impression(s) / ED Diagnoses Final diagnoses:  None    Rx / DC Orders ED Discharge Orders     None

## 2022-01-27 LAB — URINALYSIS, ROUTINE W REFLEX MICROSCOPIC
Bilirubin Urine: NEGATIVE
Glucose, UA: >1000 mg/dL — AB
Hgb urine dipstick: NEGATIVE
Ketones, ur: 15 mg/dL — AB
Nitrite: NEGATIVE
Specific Gravity, Urine: >1.046 — ABNORMAL HIGH (ref 1.005–1.030)
pH: 5.5 (ref 5.0–8.0)

## 2022-01-27 LAB — CBC WITH DIFFERENTIAL/PLATELET
Abs Immature Granulocytes: 0.1 10*3/uL — ABNORMAL HIGH (ref 0.00–0.07)
Basophils Absolute: 0.1 10*3/uL (ref 0.0–0.1)
Basophils Relative: 1 %
Eosinophils Absolute: 0 10*3/uL (ref 0.0–0.5)
Eosinophils Relative: 0 %
HCT: 36 % (ref 36.0–46.0)
Hemoglobin: 11.7 g/dL — ABNORMAL LOW (ref 12.0–15.0)
Immature Granulocytes: 1 %
Lymphocytes Relative: 27 %
Lymphs Abs: 2.7 10*3/uL (ref 0.7–4.0)
MCH: 27.3 pg (ref 26.0–34.0)
MCHC: 32.5 g/dL (ref 30.0–36.0)
MCV: 84.1 fL (ref 80.0–100.0)
Monocytes Absolute: 0.5 10*3/uL (ref 0.1–1.0)
Monocytes Relative: 5 %
Neutro Abs: 6.5 10*3/uL (ref 1.7–7.7)
Neutrophils Relative %: 66 %
Platelets: 362 10*3/uL (ref 150–400)
RBC: 4.28 MIL/uL (ref 3.87–5.11)
RDW: 14.9 % (ref 11.5–15.5)
WBC: 9.8 10*3/uL (ref 4.0–10.5)
nRBC: 0 % (ref 0.0–0.2)

## 2022-01-27 MED ORDER — SODIUM CHLORIDE 0.9 % IV BOLUS
1000.0000 mL | Freq: Once | INTRAVENOUS | Status: AC
  Administered 2022-01-27: 1000 mL via INTRAVENOUS

## 2022-01-27 NOTE — ED Provider Notes (Signed)
Nursing notes and vitals signs, including pulse oximetry, reviewed.  Summary of this visit's results, reviewed by myself:  EKG:  EKG Interpretation  Date/Time:    Ventricular Rate:    PR Interval:    QRS Duration:   QT Interval:    QTC Calculation:   R Axis:     Text Interpretation:          Labs:  Results for orders placed or performed during the hospital encounter of 01/26/22 (from the past 24 hour(s))  Comprehensive metabolic panel     Status: Abnormal   Collection Time: 01/26/22 11:13 PM  Result Value Ref Range   Sodium 138 135 - 145 mmol/L   Potassium 4.1 3.5 - 5.1 mmol/L   Chloride 103 98 - 111 mmol/L   CO2 23 22 - 32 mmol/L   Glucose, Bld 117 (H) 70 - 99 mg/dL   BUN 12 8 - 23 mg/dL   Creatinine, Ser 1.14 (H) 0.44 - 1.00 mg/dL   Calcium 9.6 8.9 - 10.3 mg/dL   Total Protein 7.3 6.5 - 8.1 g/dL   Albumin 4.5 3.5 - 5.0 g/dL   AST 20 15 - 41 U/L   ALT 13 0 - 44 U/L   Alkaline Phosphatase 83 38 - 126 U/L   Total Bilirubin 0.7 0.3 - 1.2 mg/dL   GFR, Estimated 49 (L) >60 mL/min   Anion gap 12 5 - 15  Lipase, blood     Status: None   Collection Time: 01/26/22 11:13 PM  Result Value Ref Range   Lipase 44 11 - 51 U/L  CBC with Diff     Status: Abnormal   Collection Time: 01/26/22 11:13 PM  Result Value Ref Range   WBC 9.8 4.0 - 10.5 K/uL   RBC 4.28 3.87 - 5.11 MIL/uL   Hemoglobin 11.7 (L) 12.0 - 15.0 g/dL   HCT 36.0 36.0 - 46.0 %   MCV 84.1 80.0 - 100.0 fL   MCH 27.3 26.0 - 34.0 pg   MCHC 32.5 30.0 - 36.0 g/dL   RDW 14.9 11.5 - 15.5 %   Platelets 362 150 - 400 K/uL   nRBC 0.0 0.0 - 0.2 %   Neutrophils Relative % 66 %   Neutro Abs 6.5 1.7 - 7.7 K/uL   Lymphocytes Relative 27 %   Lymphs Abs 2.7 0.7 - 4.0 K/uL   Monocytes Relative 5 %   Monocytes Absolute 0.5 0.1 - 1.0 K/uL   Eosinophils Relative 0 %   Eosinophils Absolute 0.0 0.0 - 0.5 K/uL   Basophils Relative 1 %   Basophils Absolute 0.1 0.0 - 0.1 K/uL   Immature Granulocytes 1 %   Abs Immature  Granulocytes 0.10 (H) 0.00 - 0.07 K/uL  Urinalysis, Routine w reflex microscopic Urine, Clean Catch     Status: Abnormal   Collection Time: 01/27/22  1:07 AM  Result Value Ref Range   Color, Urine YELLOW YELLOW   APPearance CLEAR CLEAR   Specific Gravity, Urine >1.046 (H) 1.005 - 1.030   pH 5.5 5.0 - 8.0   Glucose, UA >1,000 (A) NEGATIVE mg/dL   Hgb urine dipstick NEGATIVE NEGATIVE   Bilirubin Urine NEGATIVE NEGATIVE   Ketones, ur 15 (A) NEGATIVE mg/dL   Protein, ur TRACE (A) NEGATIVE mg/dL   Nitrite NEGATIVE NEGATIVE   Leukocytes,Ua TRACE (A) NEGATIVE   RBC / HPF 0-5 0 - 5 RBC/hpf   WBC, UA 6-10 0 - 5 WBC/hpf   Bacteria, UA FEW (A) NONE  SEEN   Squamous Epithelial / LPF 0-5 0 - 5    Imaging Studies: CT ABDOMEN PELVIS W CONTRAST  Result Date: 01/27/2022 CLINICAL DATA:  Acute abdominal pain and nausea for several days EXAM: CT ABDOMEN AND PELVIS WITH CONTRAST TECHNIQUE: Multidetector CT imaging of the abdomen and pelvis was performed using the standard protocol following bolus administration of intravenous contrast. RADIATION DOSE REDUCTION: This exam was performed according to the departmental dose-optimization program which includes automated exposure control, adjustment of the mA and/or kV according to patient size and/or use of iterative reconstruction technique. CONTRAST:  54m OMNIPAQUE IOHEXOL 300 MG/ML  SOLN COMPARISON:  12/30/2020 FINDINGS: Lower chest: No acute abnormality. Hepatobiliary: Gallbladder has been surgically removed. Mild prominence of the biliary tree is noted related to the post cholecystectomy state. Stable hypodensity in the right lobe of the liver is noted likely representing hemangioma stable from the prior exam. Pancreas: Unremarkable. No pancreatic ductal dilatation or surrounding inflammatory changes. Spleen: Normal in size without focal abnormality. Adrenals/Urinary Tract: Adrenal glands are within normal limits. Kidneys are well visualized bilaterally. No renal  calculi or obstructive changes are noted. Scattered small hypodensities are noted likely representing cysts but stable from the prior exam. No further follow-up is recommended. Considerable lobulation is noted. Bladder is within normal limits. Stomach/Bowel: Diverticular change of the colon is noted. No evidence of diverticulitis is seen. Appendix is not visualized. No inflammatory changes to suggest appendicitis are noted. Small bowel and stomach are within normal limits. Vascular/Lymphatic: Aortic atherosclerosis. No enlarged abdominal or pelvic lymph nodes. Reproductive: Status post hysterectomy. No adnexal masses. Other: No abdominal wall hernia or abnormality. No abdominopelvic ascites. Musculoskeletal: Degenerative changes of the lumbar spine are noted. IMPRESSION: Diverticular change without diverticulitis. Renal cortical scarring bilaterally with tiny cysts. No follow-up is recommended. Stable hypodensity within the liver for several years dating back to 2007 likely representing a hemangioma. Electronically Signed   By: MInez CatalinaM.D.   On: 01/27/2022 00:26    1:24 AM Patient able to eat ice chips without vomiting.  Urine specific gravity is still high, urine appears dark, will give an additional liter of fluid.   Emaleigh Guimond, JJenny Reichmann MD 01/27/22 [phone]

## 2022-01-29 ENCOUNTER — Ambulatory Visit: Payer: Medicare Other | Admitting: Physician Assistant

## 2022-01-29 DIAGNOSIS — S91302D Unspecified open wound, left foot, subsequent encounter: Secondary | ICD-10-CM

## 2022-01-29 NOTE — Progress Notes (Signed)
This is a pleasant 76 year old female seen in our office for follow-up evaluation and potential wound VAC change status post excision of left foot wound with placement of myriad powder and placement of VAC by Dr. Marla Roe on 01/14/2022.  The patient was most recently seen in our office on 01/22/2022.  At that time she was having difficulty getting home health come out and help change her wound VAC, we have been working on this but due to insurance issues she has not had anyone come out at this point.  She notes that since her last office visit she had been feeling unwell and presented to the emergency room for evaluation.  No significant etiology was noted at that time.  She notes she is having some nausea today but denies any fever or vomiting.  She has not remove the dressing from her foot.  I did remove the wound VAC and dressing, the Sorbact dressing did come off with the wound VAC dressing.  Underneath the wound shows good granulation tissue to the level of the skin, her wound edges are moist with no surrounding redness, warmth or discharge.      Given the patient's quick progression and granulation I did elect not to place the wound VAC back on.  We will order collagen sheets via [employer] that the patient can change every other day.  We did give instructions to the patient's family via phone who agrees they will help her, she understands that she may return to office to help with this as well.  We would like to see the patient back in our office in 2 weeks for reevaluation.  We would like to see her sooner if she develops any new or worsening signs or symptoms.  The patient verbalized understanding and agreement to today's plan had no further questions or concerns.

## 2022-02-01 ENCOUNTER — Telehealth: Payer: Self-pay | Admitting: *Deleted

## 2022-02-01 ENCOUNTER — Telehealth: Payer: Self-pay

## 2022-02-01 NOTE — Telephone Encounter (Signed)
Faxed on (01/30/2022) Order to [facility] for supplies for the patient.  Confirmation received and copy scanned into the chart.//AB/CMA

## 2022-02-01 NOTE — Telephone Encounter (Signed)
Faxed [facility] and they are unable to take patient as well. Wound Vac has been removed at least OV. Will follow-up in office.

## 2022-02-09 ENCOUNTER — Telehealth: Payer: Self-pay | Admitting: Plastic Surgery

## 2022-02-09 NOTE — Telephone Encounter (Signed)
Gwen is calling in reference to the pt having wound care they are needing to have measurements of the pts wounds so that it can be sent to the pts insurance.  You can give her a call back at [phone] Ext: [phone] and please reference # [id number] when calling back.

## 2022-02-11 ENCOUNTER — Ambulatory Visit: Payer: Medicare Other | Admitting: Physician Assistant

## 2022-02-11 DIAGNOSIS — S91302D Unspecified open wound, left foot, subsequent encounter: Secondary | ICD-10-CM | POA: Diagnosis not present

## 2022-02-11 NOTE — Progress Notes (Signed)
This is a pleasant 77 year old female seen in our office for follow-up evaluation status post surgery with excision of left foot wound and placement of myriad powder and wound VAC by Dr. Marla Roe on 01/14/2022.  Patient was most recently seen in our office on 01/29/2022.  At that time she had been doing very well.  Her granulation tissue was near to the level of the skin and she had no signs of infectious etiology.  At that time we elected not to replace the wound VAC but rather order collagen sheets and have her start these dressing changes.  The patient is accompanied by a friend who notes they have been doing this every other day as instructed.  The patient notes the wound has not caused any issues, she denies any infectious symptoms.  Unfortunately the patient did suffer a fall and hurt her right foot which she has associated swelling difficulty ambulating.  She notes she has not had any evaluation for this yet.  On exam she has a left dorsal foot wound measuring approximately 4 x 6 x 0 cm wound with granulation tissue to the level and slightly above the skin.  The wound edges are clean and moist with no surrounding redness warmth or discharge.  Her right foot has swelling and ecchymosis to the dorsal aspect.  Overall the patient's wound appears to be healing well without complications or issues.  I will discuss the case with Dr. Marla Roe as far as next steps in her care.  We will reach out to the patient to schedule a follow-up appointment at that time.  In the meantime I have instructed the patient to follow-up closely with either primary care or urgent care or the emergency room for evaluation of her foot given the fact that it has been this way for approximately a week and she is having difficulty ambulating with associated swelling.  She reports she will follow-up immediately for evaluation of this.  The patient was given strict return precautions, both the patient and her friend verbalized  understanding and agreement to today's plan had no further questions or concerns.

## 2022-02-12 ENCOUNTER — Other Ambulatory Visit: Payer: Self-pay | Admitting: Family Medicine

## 2022-02-16 ENCOUNTER — Ambulatory Visit (INDEPENDENT_AMBULATORY_CARE_PROVIDER_SITE_OTHER): Payer: Medicare Other | Admitting: Physician Assistant

## 2022-02-16 DIAGNOSIS — S91302D Unspecified open wound, left foot, subsequent encounter: Secondary | ICD-10-CM

## 2022-02-16 NOTE — Progress Notes (Signed)
Patient is a 76 year old female with history of a left foot wound.  Patient underwent excision of her left foot wound and placement of myriad powder and wound VAC to the wound on 01/14/2022 with Dr. Marla Roe.  Patient presents to the clinic for postoperative follow-up.  Patient was last seen in the clinic on 02/11/2022.  At this visit, reported she had been using collagen sheets every other day for dressing changes.  On exam, her wound measured approximately 4 x 6 x 0 cm with granulation tissue to the level and slightly above the skin.  The wound edges were clean.    Since her last visit she notes she has been doing well, she denies any significant complaints or concerns.  She has continued to use the collagen sheets every other day.   On exam her left dorsal foot wound measures 3 x 2 x 0 cm.  She has good granulation tissue that has somewhat extended above the level of the skin, no surrounding redness or erythema, no discharge  Overall the patient is doing very well from a healing standpoint.  She had significant interval reduction in the size of the wound with good granulation tissue no infectious signs.  Given the granulation tissue was above the level of skin I did perform chemical cautery with silver nitrate, the patient tolerated this without difficulty.  The patient will continue her collagen sheet therapy every other day.  I would like to see her back in the clinic in 2 weeks for reevaluation.  The patient verbalized understanding and agreement to today's plan had no further questions or concerns.

## 2022-02-16 NOTE — Progress Notes (Signed)
Patient is a 76 year old female with history of a left foot wound.  Patient underwent excision of her left foot wound and placement of myriad powder and wound VAC to the wound on 01/14/2022 with Dr. Marla Roe.  Patient presents to the clinic for postoperative follow-up.  Patient was last seen in the clinic on 02/11/2022.  At this visit, reported she had been using collagen sheets every other day for dressing changes.  On exam, her wound measured approximately 4 x 6 x 0 cm with granulation tissue to the level and slightly above the skin.  The wound edges were clean.

## 2022-03-10 ENCOUNTER — Encounter: Payer: Self-pay | Admitting: Physician Assistant

## 2022-03-10 ENCOUNTER — Ambulatory Visit: Payer: Medicare Other | Admitting: Physician Assistant

## 2022-03-10 DIAGNOSIS — S91302D Unspecified open wound, left foot, subsequent encounter: Secondary | ICD-10-CM

## 2022-03-10 NOTE — Progress Notes (Signed)
This is a 76 year old female with a history of left foot wound.  She underwent excision of left foot wound and placement of myriad powder and wound VAC to the wound on 01/14/2022 with Dr. Marla Roe.  She presents today for follow-up evaluation.  She was most recently seen in the office on 02/16/2022.  She had been using collagen sheets at that time and has continued to do so.  At her last office visit her wound measured 3 x 2 x 0 cm.  Since her last office visit she denies any complaints or concerns, she feels that her wound had healed over but noted some granulation tissue that had opened up again.  She is very happy with the progress.  She denies any infectious symptoms or any significant pain.  On exam the left foot has a small wound measuring 2 cm x 0.5 cm x.0 cm with granulation tissue, no surrounding redness warmth or discharge, skin edges are clean.     Overall the patient is doing very well.  Her wound continues to improve, I did use some silver nitrate to bring down the granulation tissue to the level of the wound edges.  I have encouraged her to continue using the collagen sheets, would like to see her back in our office in 2 weeks.  I anticipate she will continue to have improvement.  She was given strict return precautions.  She verbalized understanding and agreement to today's plan.

## 2022-03-16 ENCOUNTER — Other Ambulatory Visit: Payer: Self-pay | Admitting: Family Medicine

## 2022-03-23 ENCOUNTER — Other Ambulatory Visit: Payer: Self-pay

## 2022-03-23 ENCOUNTER — Emergency Department (HOSPITAL_COMMUNITY): Payer: Medicare Other

## 2022-03-23 ENCOUNTER — Encounter (HOSPITAL_COMMUNITY): Payer: Self-pay

## 2022-03-23 ENCOUNTER — Inpatient Hospital Stay (HOSPITAL_COMMUNITY)
Admission: EM | Admit: 2022-03-23 | Discharge: 2022-03-27 | DRG: 103 | Disposition: A | Discharge status: Home Health | Payer: Medicare Other | Attending: Internal Medicine | Admitting: Internal Medicine

## 2022-03-23 DIAGNOSIS — E7800 Pure hypercholesterolemia, unspecified: Secondary | ICD-10-CM | POA: Diagnosis present

## 2022-03-23 DIAGNOSIS — Z79899 Other long term (current) drug therapy: Secondary | ICD-10-CM | POA: Diagnosis not present

## 2022-03-23 DIAGNOSIS — W19XXXA Unspecified fall, initial encounter: Secondary | ICD-10-CM | POA: Diagnosis present

## 2022-03-23 DIAGNOSIS — R42 Dizziness and giddiness: Secondary | ICD-10-CM

## 2022-03-23 DIAGNOSIS — I129 Hypertensive chronic kidney disease with stage 1 through stage 4 chronic kidney disease, or unspecified chronic kidney disease: Secondary | ICD-10-CM | POA: Diagnosis present

## 2022-03-23 DIAGNOSIS — Z9049 Acquired absence of other specified parts of digestive tract: Secondary | ICD-10-CM

## 2022-03-23 DIAGNOSIS — Z885 Allergy status to narcotic agent status: Secondary | ICD-10-CM

## 2022-03-23 DIAGNOSIS — R4189 Other symptoms and signs involving cognitive functions and awareness: Secondary | ICD-10-CM | POA: Diagnosis present

## 2022-03-23 DIAGNOSIS — N183 Chronic kidney disease, stage 3 unspecified: Secondary | ICD-10-CM | POA: Diagnosis present

## 2022-03-23 DIAGNOSIS — Z923 Personal history of irradiation: Secondary | ICD-10-CM

## 2022-03-23 DIAGNOSIS — I480 Paroxysmal atrial fibrillation: Secondary | ICD-10-CM | POA: Diagnosis present

## 2022-03-23 DIAGNOSIS — Z881 Allergy status to other antibiotic agents status: Secondary | ICD-10-CM

## 2022-03-23 DIAGNOSIS — E860 Dehydration: Secondary | ICD-10-CM | POA: Diagnosis present

## 2022-03-23 DIAGNOSIS — F0781 Postconcussional syndrome: Secondary | ICD-10-CM | POA: Diagnosis not present

## 2022-03-23 DIAGNOSIS — Z7901 Long term (current) use of anticoagulants: Secondary | ICD-10-CM | POA: Diagnosis not present

## 2022-03-23 DIAGNOSIS — F32A Depression, unspecified: Secondary | ICD-10-CM | POA: Diagnosis present

## 2022-03-23 DIAGNOSIS — E871 Hypo-osmolality and hyponatremia: Secondary | ICD-10-CM | POA: Diagnosis present

## 2022-03-23 DIAGNOSIS — F419 Anxiety disorder, unspecified: Secondary | ICD-10-CM | POA: Diagnosis present

## 2022-03-23 DIAGNOSIS — Z90710 Acquired absence of both cervix and uterus: Secondary | ICD-10-CM

## 2022-03-23 DIAGNOSIS — Z23 Encounter for immunization: Secondary | ICD-10-CM

## 2022-03-23 DIAGNOSIS — N179 Acute kidney failure, unspecified: Secondary | ICD-10-CM | POA: Diagnosis not present

## 2022-03-23 DIAGNOSIS — D509 Iron deficiency anemia, unspecified: Secondary | ICD-10-CM | POA: Diagnosis present

## 2022-03-23 DIAGNOSIS — E1165 Type 2 diabetes mellitus with hyperglycemia: Secondary | ICD-10-CM | POA: Diagnosis present

## 2022-03-23 DIAGNOSIS — Z853 Personal history of malignant neoplasm of breast: Secondary | ICD-10-CM

## 2022-03-23 DIAGNOSIS — R1110 Vomiting, unspecified: Secondary | ICD-10-CM | POA: Diagnosis present

## 2022-03-23 DIAGNOSIS — E538 Deficiency of other specified B group vitamins: Secondary | ICD-10-CM | POA: Diagnosis present

## 2022-03-23 DIAGNOSIS — S0990XA Unspecified injury of head, initial encounter: Principal | ICD-10-CM

## 2022-03-23 DIAGNOSIS — Z7984 Long term (current) use of oral hypoglycemic drugs: Secondary | ICD-10-CM

## 2022-03-23 DIAGNOSIS — E872 Acidosis, unspecified: Secondary | ICD-10-CM | POA: Diagnosis present

## 2022-03-23 DIAGNOSIS — E1122 Type 2 diabetes mellitus with diabetic chronic kidney disease: Secondary | ICD-10-CM | POA: Diagnosis present

## 2022-03-23 DIAGNOSIS — Z8249 Family history of ischemic heart disease and other diseases of the circulatory system: Secondary | ICD-10-CM

## 2022-03-23 DIAGNOSIS — Z888 Allergy status to other drugs, medicaments and biological substances status: Secondary | ICD-10-CM

## 2022-03-23 LAB — CBC WITH DIFFERENTIAL/PLATELET
Abs Immature Granulocytes: 0.06 10*3/uL (ref 0.00–0.07)
Basophils Absolute: 0.1 10*3/uL (ref 0.0–0.1)
Basophils Relative: 1 %
Eosinophils Absolute: 0 10*3/uL (ref 0.0–0.5)
Eosinophils Relative: 0 %
HCT: 42.1 % (ref 36.0–46.0)
Hemoglobin: 12.5 g/dL (ref 12.0–15.0)
Immature Granulocytes: 1 %
Lymphocytes Relative: 10 %
Lymphs Abs: 1 10*3/uL (ref 0.7–4.0)
MCH: 26.2 pg (ref 26.0–34.0)
MCHC: 29.7 g/dL — ABNORMAL LOW (ref 30.0–36.0)
MCV: 88.1 fL (ref 80.0–100.0)
Monocytes Absolute: 0.3 10*3/uL (ref 0.1–1.0)
Monocytes Relative: 3 %
Neutro Abs: 8.6 10*3/uL — ABNORMAL HIGH (ref 1.7–7.7)
Neutrophils Relative %: 85 %
Platelets: 289 10*3/uL (ref 150–400)
RBC: 4.78 MIL/uL (ref 3.87–5.11)
RDW: 17.2 % — ABNORMAL HIGH (ref 11.5–15.5)
WBC: 10 10*3/uL (ref 4.0–10.5)
nRBC: 0 % (ref 0.0–0.2)

## 2022-03-23 LAB — BASIC METABOLIC PANEL
Anion gap: 10 (ref 5–15)
BUN: 10 mg/dL (ref 8–23)
CO2: 15 mmol/L — ABNORMAL LOW (ref 22–32)
Calcium: 7.8 mg/dL — ABNORMAL LOW (ref 8.9–10.3)
Chloride: 115 mmol/L — ABNORMAL HIGH (ref 98–111)
Creatinine, Ser: 0.82 mg/dL (ref 0.44–1.00)
GFR, Estimated: >60 mL/min (ref >60)
Glucose, Bld: 135 mg/dL — ABNORMAL HIGH (ref 70–99)
Potassium: 4.1 mmol/L (ref 3.5–5.1)
Sodium: 140 mmol/L (ref 135–145)

## 2022-03-23 MED ORDER — INSULIN ASPART 100 UNIT/ML IJ SOLN: 0.0000 [IU] | Freq: Every day | INTRAMUSCULAR | Status: DC

## 2022-03-23 MED ORDER — ONDANSETRON HCL 4 MG/2ML IJ SOLN
4.0000 mg | Freq: Four times a day (QID) | INTRAMUSCULAR | Status: DC | PRN
  Administered 2022-03-26: 4 mg via INTRAVENOUS
  Filled 2022-03-23: qty 2

## 2022-03-23 MED ORDER — APIXABAN 5 MG PO TABS
5.0000 mg | ORAL_TABLET | Freq: Two times a day (BID) | ORAL | Status: DC
  Administered 2022-03-24 – 2022-03-27 (×8): 5 mg via ORAL
  Filled 2022-03-23 (×8): qty 1

## 2022-03-23 MED ORDER — QUETIAPINE FUMARATE 50 MG PO TABS
50.0000 mg | ORAL_TABLET | Freq: Every day | ORAL | Status: DC
  Administered 2022-03-24 – 2022-03-26 (×4): 50 mg via ORAL
  Filled 2022-03-23: qty 1
  Filled 2022-03-23: qty 2
  Filled 2022-03-23 (×2): qty 1

## 2022-03-23 MED ORDER — PAROXETINE HCL 20 MG PO TABS
20.0000 mg | ORAL_TABLET | Freq: Every day | ORAL | Status: DC
  Administered 2022-03-24 – 2022-03-27 (×4): 20 mg via ORAL
  Filled 2022-03-23 (×5): qty 1

## 2022-03-23 MED ORDER — MELATONIN 5 MG PO TABS
5.0000 mg | ORAL_TABLET | Freq: Every evening | ORAL | Status: DC | PRN
  Administered 2022-03-25 – 2022-03-26 (×3): 5 mg via ORAL
  Filled 2022-03-23 (×3): qty 1

## 2022-03-23 MED ORDER — PANTOPRAZOLE SODIUM 40 MG PO TBEC
40.0000 mg | DELAYED_RELEASE_TABLET | Freq: Every day | ORAL | Status: DC
  Administered 2022-03-24 – 2022-03-26 (×4): 40 mg via ORAL
  Filled 2022-03-23 (×4): qty 1

## 2022-03-23 MED ORDER — ATORVASTATIN CALCIUM 40 MG PO TABS
40.0000 mg | ORAL_TABLET | Freq: Every day | ORAL | Status: DC
  Administered 2022-03-24 – 2022-03-27 (×4): 40 mg via ORAL
  Filled 2022-03-23 (×4): qty 1

## 2022-03-23 MED ORDER — POLYETHYLENE GLYCOL 3350 17 G PO PACK: 17.0000 g | PACK | Freq: Every day | ORAL | Status: DC | PRN

## 2022-03-23 MED ORDER — LORAZEPAM 2 MG/ML IJ SOLN
0.5000 mg | Freq: Once | INTRAMUSCULAR | Status: AC
  Administered 2022-03-23: 0.5 mg via INTRAVENOUS
  Filled 2022-03-23: qty 1

## 2022-03-23 MED ORDER — INSULIN ASPART 100 UNIT/ML IJ SOLN: 0.0000 [IU] | Freq: Three times a day (TID) | INTRAMUSCULAR | Status: DC

## 2022-03-23 MED ORDER — ACETAMINOPHEN 325 MG PO TABS
650.0000 mg | ORAL_TABLET | Freq: Four times a day (QID) | ORAL | Status: DC | PRN
  Administered 2022-03-24 – 2022-03-25 (×2): 650 mg via ORAL
  Filled 2022-03-23 (×3): qty 2

## 2022-03-23 MED ORDER — ONDANSETRON HCL 4 MG/2ML IJ SOLN
4.0000 mg | Freq: Once | INTRAMUSCULAR | Status: AC
  Administered 2022-03-23: 4 mg via INTRAVENOUS
  Filled 2022-03-23: qty 2

## 2022-03-23 MED ORDER — MECLIZINE HCL 25 MG PO TABS
12.5000 mg | ORAL_TABLET | Freq: Once | ORAL | Status: AC
  Administered 2022-03-23: 12.5 mg via ORAL
  Filled 2022-03-23: qty 1

## 2022-03-23 MED ORDER — AMLODIPINE BESYLATE 5 MG PO TABS
5.0000 mg | ORAL_TABLET | Freq: Every day | ORAL | Status: DC
  Administered 2022-03-24 – 2022-03-27 (×4): 5 mg via ORAL
  Filled 2022-03-23 (×4): qty 1

## 2022-03-23 NOTE — ED Notes (Signed)
Attempted to ambulate pt around room. Pt unable to ambulate. When RN stood pt up, pt stated she was very dizzy and had to lay back down. Pt also began vomiting while standing. MD notified. Pt also soiled herself. Pt cleaned. Full linen change completed.

## 2022-03-23 NOTE — ED Triage Notes (Signed)
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

## 2022-03-23 NOTE — ED Provider Notes (Signed)
[facility] EMERGENCY DEPARTMENT Provider Note   CSN: [id number] Arrival date & time: 03/23/22  1800     History  Chief Complaint  Patient presents with   Earlyne Iba is a 76 y.o. female.  HPI Patient presents via EMS after a fall that actually occurred yesterday.  She fell backwards, struck her head, did not lose consciousness, probably, but felt sudden onset of pain with a hematoma.  She was seen and evaluated by EMS at that point, declined transport.  However, today, the patient awakened with nausea, has had vomiting, as well as dizziness.  No weakness in any extremity, no chest pain, dyspnea, no relief with anything until EMS provided Zofran.    Home Medications Prior to Admission medications   Medication Sig Start Date End Date Taking? Authorizing Provider  acetaminophen (TYLENOL) 325 MG tablet Take 2 tablets (650 mg total) by mouth every 6 (six) hours as needed for mild pain (or Fever >/= 101). 01/05/21   Nita Sells, MD  amLODipine (NORVASC) 5 MG tablet TAKE 1 TABLET BY MOUTH ONCE A DAY 05/04/21   [name], MD  atorvastatin (LIPITOR) 40 MG tablet TAKE 1 TABLET BY MOUTH ONCE A DAY 01/14/22   [name], MD  ELIQUIS 5 MG TABS tablet TAKE 1 TABLET BY MOUTH TWICE A DAY 05/04/21   [name], MD  JARDIANCE 25 MG TABS tablet TAKE 1 TABLET BY MOUTH ONCE A DAY Patient taking differently: Take 25 mg by mouth at bedtime. 05/04/21   [name], MD  losartan (COZAAR) 50 MG tablet TAKE 1 TABLET BY MOUTH ONCE A DAY Patient taking differently: Take 50 mg by mouth at bedtime. 09/30/21   [name], MD  metFORMIN (GLUCOPHAGE-XR) 500 MG 24 hr tablet TAKE 4 TABLETS BY MOUTH ONCE DAILY WITH BREAKFAST. 05/04/21   [name], MD  metoprolol succinate (TOPROL-XL) 25 MG 24 hr tablet TAKE 1 TABLET BY MOUTH ONCE A DAY (NEED OFFICE VISIT FOR FUTURE REFILLS) 02/12/22   [name], MD  mirabegron ER (MYRBETRIQ) 25 MG TB24  tablet Take 1 tablet (25 mg total) by mouth daily. 12/15/21   [name], MD  nystatin ointment (MYCOSTATIN) APPLY TO AFFECTED AREAS TWICE DAILY 11/10/20   [name], MD  ondansetron (ZOFRAN) 4 MG tablet Take 1 tablet (4 mg total) by mouth every 8 (eight) hours as needed for up to 20 doses for nausea or vomiting. 01/12/22   Clance Boll, PA-C  oxyCODONE (ROXICODONE) 5 MG immediate release tablet Take 1 tablet (5 mg total) by mouth every 8 (eight) hours as needed for up to 20 doses for severe pain. 01/12/22   Clance Boll, PA-C  pantoprazole (PROTONIX) 40 MG tablet TAKE 1 TABLET BY MOUTH EVERY MORNING Patient taking differently: Take 40 mg by mouth at bedtime. 07/01/21   [name], MD  PARoxetine (PAXIL) 20 MG tablet TAKE 1 TABLET BY MOUTH ONCE A DAY 10/20/21   [name], MD  QUEtiapine (SEROQUEL) 50 MG tablet TAKE 1 TABLET BY MOUTH DAILY AT BEDTIME 03/16/22   [name], MD      Allergies    Ambien [zolpidem tartrate], Cymbalta [duloxetine hcl], Other, Ciprofloxacin, Doxazosin, and Percocet [oxycodone-acetaminophen]    Review of Systems   Review of Systems  All other systems reviewed and are negative.   Physical Exam Updated Vital Signs BP (!) 160/86   Pulse 80  Temp 97.6 F (36.4 C) (Oral)   Resp 15   Ht '5\' 5"'$  (1.651 m)   Wt 68.9 kg   SpO2 99%   BMI 25.28 kg/m  Physical Exam Vitals and nursing note reviewed.  Constitutional:      General: She is not in acute distress.    Appearance: She is well-developed.  HENT:     Head: Normocephalic.   Eyes:     Conjunctiva/sclera: Conjunctivae normal.  Cardiovascular:     Rate and Rhythm: Normal rate and regular rhythm.  Pulmonary:     Effort: Pulmonary effort is normal. No respiratory distress.     Breath sounds: Normal breath sounds. No stridor.  Abdominal:     General: There is no distension.  Skin:    General: Skin is warm and dry.  Neurological:     Mental Status: She is alert and  oriented to person, place, and time.     Cranial Nerves: No cranial nerve deficit.     Motor: Atrophy present.     Comments: Age-appropriate atrophy otherwise neurologic exam unremarkable  Psychiatric:        Mood and Affect: Mood normal.     ED Results / Procedures / Treatments   Labs (all labs ordered are listed, but only abnormal results are displayed) Labs Reviewed  BASIC METABOLIC PANEL - Abnormal; Notable for the following components:      Result Value   Chloride 115 (*)    CO2 15 (*)    Glucose, Bld 135 (*)    Calcium 7.8 (*)    All other components within normal limits  CBC WITH DIFFERENTIAL/PLATELET - Abnormal; Notable for the following components:   MCHC 29.7 (*)    RDW 17.2 (*)    Neutro Abs 8.6 (*)    All other components within normal limits    EKG None  Radiology CT Head Wo Contrast  Result Date: 03/23/2022 CLINICAL DATA:  Blunt fall onto occiput and neck with nausea and vomiting EXAM: CT HEAD WITHOUT CONTRAST CT CERVICAL SPINE WITHOUT CONTRAST TECHNIQUE: Multidetector CT imaging of the head and cervical spine was performed following the standard protocol without intravenous contrast. Multiplanar CT image reconstructions of the cervical spine were also generated. RADIATION DOSE REDUCTION: This exam was performed according to the departmental dose-optimization program which includes automated exposure control, adjustment of the mA and/or kV according to patient size and/or use of iterative reconstruction technique. COMPARISON:  MRI head 01/03/2021 FINDINGS: CT HEAD FINDINGS Brain: No intracranial hemorrhage, mass effect, or evidence of acute infarct. No hydrocephalus. No extra-axial fluid collection. Generalized cerebral atrophy. Ill-defined hypoattenuation within the cerebral white matter is nonspecific but consistent with chronic small vessel ischemic disease. Vascular: No hyperdense vessel. Intracranial arterial calcification. Skull: No fracture or focal lesion. Small  4.5 x 1.2 cm hematoma near the posterior right paramidline occipital scalp. Sinuses/Orbits: No acute finding. Paranasal sinuses and mastoid air cells are well aerated. Other: None. CT CERVICAL SPINE FINDINGS Alignment: Loss of lordosis with slight anterolisthesis of C3 is chronic. Skull base and vertebrae: No acute fracture. No primary bone lesion or focal pathologic process. Soft tissues and spinal canal: No prevertebral fluid or swelling. No visible canal hematoma. Disc levels: Advanced spondylosis with bulky anterior osteophytes, disc space height loss, and degenerative endplate changes at Z6-X0. Posterior disc osteophyte complex at C5-C6 causes mild-moderate effacement of the anterior thecal sac greater on the left. Multilevel uncovertebral spurring and facet arthropathy cause advanced neural foraminal narrowing on the left at  C3-C4 and on the left at C5-C6. Upper chest: Negative. Other: Aortic atherosclerotic calcification. IMPRESSION: 1. 1. No acute intracranial abnormality. 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 2. Small hematoma near the occipital scalp. No underlying fracture. 3. No acute fracture in the cervical spine. Multilevel degenerative spondylosis. Electronically Signed   By: Placido Sou M.D.   On: 03/23/2022 19:08   CT Cervical Spine Wo Contrast  Result Date: 03/23/2022 CLINICAL DATA:  Blunt fall onto occiput and neck with nausea and vomiting EXAM: CT HEAD WITHOUT CONTRAST CT CERVICAL SPINE WITHOUT CONTRAST TECHNIQUE: Multidetector CT imaging of the head and cervical spine was performed following the standard protocol without intravenous contrast. Multiplanar CT image reconstructions of the cervical spine were also generated. RADIATION DOSE REDUCTION: This exam was performed according to the departmental dose-optimization program which includes automated exposure control, adjustment of the mA and/or kV according to patient size and/or use of iterative reconstruction  technique. COMPARISON:  MRI head 01/03/2021 FINDINGS: CT HEAD FINDINGS Brain: No intracranial hemorrhage, mass effect, or evidence of acute infarct. No hydrocephalus. No extra-axial fluid collection. Generalized cerebral atrophy. Ill-defined hypoattenuation within the cerebral white matter is nonspecific but consistent with chronic small vessel ischemic disease. Vascular: No hyperdense vessel. Intracranial arterial calcification. Skull: No fracture or focal lesion. Small 4.5 x 1.2 cm hematoma near the posterior right paramidline occipital scalp. Sinuses/Orbits: No acute finding. Paranasal sinuses and mastoid air cells are well aerated. Other: None. CT CERVICAL SPINE FINDINGS Alignment: Loss of lordosis with slight anterolisthesis of C3 is chronic. Skull base and vertebrae: No acute fracture. No primary bone lesion or focal pathologic process. Soft tissues and spinal canal: No prevertebral fluid or swelling. No visible canal hematoma. Disc levels: Advanced spondylosis with bulky anterior osteophytes, disc space height loss, and degenerative endplate changes at T2-I7. Posterior disc osteophyte complex at C5-C6 causes mild-moderate effacement of the anterior thecal sac greater on the left. Multilevel uncovertebral spurring and facet arthropathy cause advanced neural foraminal narrowing on the left at C3-C4 and on the left at C5-C6. Upper chest: Negative. Other: Aortic atherosclerotic calcification. IMPRESSION: 1. 1. No acute intracranial abnormality. 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 2. Small hematoma near the occipital scalp. No underlying fracture. 3. No acute fracture in the cervical spine. Multilevel degenerative spondylosis. Electronically Signed   By: Placido Sou M.D.   On: 03/23/2022 19:08    Procedures Procedures    Medications Ordered in ED Medications  meclizine (ANTIVERT) tablet 12.5 mg (has no administration in time range)    ED Course/ Medical Decision Making/  A&P This patient with a Hx of hypertension, diabetes presents to the ED for concern of nausea, vomiting, following head trauma, this involves an extensive number of treatment options, and is a complaint that carries with it a high risk of complications and morbidity.    The differential diagnosis includes concussion, intracranial hemorrhage, fracture, as well as metabolic considerations given history of diabetes, hypertension.   Social Determinants of Health:  Age  Additional history obtained:  Additional history and/or information obtained from EMS, notable for details included above   After the initial evaluation, orders, including: CT labs monitoring were initiated.  Meclizine provided   Patient placed on Cardiac and Pulse-Oximetry Monitors. The patient was maintained on a cardiac monitor.  The cardiac monitored showed an rhythm of 80 sinus normal The patient was also maintained on pulse oximetry. The readings were typically 100% room air normal   On repeat evaluation of the  patient stayed the same 9:17 PM Patient unable to stand, without dizziness, nausea, vomiting  11:21 PM  Lab Tests:  I personally interpreted labs.  The pertinent results include: Unremarkable labs  Imaging Studies ordered:  I independently visualized and interpreted imaging which showed no intracranial hemorrhage, no skull fracture, no cervical spine fracture I agree with the radiologist interpretation  Consultations Obtained:  I requested consultation with the neurology,  and discussed lab and imaging findings as well as pertinent plan - they recommend: Admission  Dispostion / Final MDM:  After consideration of the diagnostic results and the patient's response to treatment, patient with persistent dizziness in the context of recent head trauma, concern for posttraumatic concussion, with ongoing symptoms.  Patient had minimal improvement with meclizine, was unable to walk, was admitted for further  monitoring, management.  Final Clinical Impression(s) / ED Diagnoses Final diagnoses:  Injury of head, initial encounter  Dizziness and giddiness     Carmin Muskrat, MD 03/23/22 2322

## 2022-03-23 NOTE — H&P (Incomplete)
History and Physical  Kathryn Wall QIW:[id number] DOB: Dec 02, 1945 DOA: 03/23/2022  Referring physician: Dr. Vanita Panda, [place]  PCP: [name], MD  Outpatient Specialists: Cardiology, plastic surgery. Patient coming from: Home.  Chief Complaint: Dizziness and vomiting after fall 1 day ago.  HPI: Kathryn Wall is a 76 y.o. female with medical history significant for hypertension, hyperlipidemia, breast cancer with lumpectomy and radiation BRCA negative, type 2 diabetes, paroxysmal A-fib on Eliquis, who presented to [facility] ED due to dizziness and vomiting post fall 1 day ago.  Associated with difficulty ambulating due to persistent dizziness.  She fell and hit the back of her head, did not lose consciousness.  EMS was activated however she declined going to the hospital at that time.  Since then, she has had persistent dizziness.   In the ED, the patient is unable to stand without dizziness nausea or vomiting.  Meclizine was provided with minimal improvement.  EDP discussed the case with neurology, recommended MRI brain and admission for monitoring and management.  The patient was admitted by [facility], hospitalist service.  ED Course: Tmax 98.1.  BP 140/81, pulse 81, respiratory rate 16, O2 saturation 97% on room air.  Lab studies remarkable for serum bicarb 15, glucose 135, calcium 7.8.  Last albumin 4.5.  Review of Systems: Review of systems as noted in the HPI. All other systems reviewed and are negative.   Past Medical History:  Diagnosis Date   Anxiety    Breast cancer ([place]) 06/29/1995   AGE 44, BRCA 1 NEGATIVE 2. 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    CKD (chronic kidney disease), stage III (HCC)    Cognitive deficits    mild   Depression    Diabetes mellitus    TYPE II   High cholesterol    Hypertension    PAF (paroxysmal atrial fibrillation) [facility])    Past Surgical History:  Procedure Laterality Date   ABDOMINAL HYSTERECTOMY  06/29/1983   TAH   ANKLE  SURGERY Right    APPLICATION OF WOUND VAC Left 01/14/2022   Procedure: APPLICATION OF WOUND VAC;  Surgeon: Wallace Going, DO;  Location: [place];  Service: Plastics;  Laterality: Left;   BREAST SURGERY  06/29/1995   RIGHT BREAST LUMPECTOMY   CHOLECYSTECTOMY  06/28/1978   COLONOSCOPY      Social History:  reports that she has never smoked. She has never used smokeless tobacco. She reports that she does not drink alcohol and does not use drugs.   Allergies  Allergen Reactions   Ambien [Zolpidem Tartrate] Other (See Comments)    Can not tolerate, causes sleepwalking   Cymbalta [Duloxetine Hcl] Other (See Comments)    Unknown Reaction   Other     SENSITIVE TO ANTIBIOTICS   Ciprofloxacin Nausea Only    Nausea    Doxazosin Nausea And Vomiting   Percocet [Oxycodone-Acetaminophen] Nausea And Vomiting    Family History  Problem Relation Age of Onset   Cancer Mother        COLON   Hypertension Father    Heart disease Father       Prior to Admission medications   Medication Sig Start Date End Date Taking? Authorizing Provider  acetaminophen (TYLENOL) 325 MG tablet Take 2 tablets (650 mg total) by mouth every 6 (six) hours as needed for mild pain (or Fever >/= 101). 01/05/21   Nita Sells, MD  amLODipine (NORVASC) 5 MG tablet TAKE 1 TABLET BY MOUTH ONCE A DAY 05/04/21  [name], MD  atorvastatin (LIPITOR) 40 MG tablet TAKE 1 TABLET BY MOUTH ONCE A DAY 01/14/22   [name], MD  ELIQUIS 5 MG TABS tablet TAKE 1 TABLET BY MOUTH TWICE A DAY 05/04/21   [name], MD  JARDIANCE 25 MG TABS tablet TAKE 1 TABLET BY MOUTH ONCE A DAY Patient taking differently: Take 25 mg by mouth at bedtime. 05/04/21   [name], MD  losartan (COZAAR) 50 MG tablet TAKE 1 TABLET BY MOUTH ONCE A DAY Patient taking differently: Take 50 mg by mouth at bedtime. 09/30/21   [name], MD  metFORMIN (GLUCOPHAGE-XR) 500 MG 24 hr tablet TAKE 4 TABLETS BY MOUTH ONCE DAILY  WITH BREAKFAST. 05/04/21   [name], MD  metoprolol succinate (TOPROL-XL) 25 MG 24 hr tablet TAKE 1 TABLET BY MOUTH ONCE A DAY (NEED OFFICE VISIT FOR FUTURE REFILLS) 02/12/22   [name], MD  mirabegron ER (MYRBETRIQ) 25 MG TB24 tablet Take 1 tablet (25 mg total) by mouth daily. 12/15/21   [name], MD  nystatin ointment (MYCOSTATIN) APPLY TO AFFECTED AREAS TWICE DAILY 11/10/20   [name], MD  ondansetron (ZOFRAN) 4 MG tablet Take 1 tablet (4 mg total) by mouth every 8 (eight) hours as needed for up to 20 doses for nausea or vomiting. 01/12/22   Clance Boll, PA-C  oxyCODONE (ROXICODONE) 5 MG immediate release tablet Take 1 tablet (5 mg total) by mouth every 8 (eight) hours as needed for up to 20 doses for severe pain. 01/12/22   Clance Boll, PA-C  pantoprazole (PROTONIX) 40 MG tablet TAKE 1 TABLET BY MOUTH EVERY MORNING Patient taking differently: Take 40 mg by mouth at bedtime. 07/01/21   [name], MD  PARoxetine (PAXIL) 20 MG tablet TAKE 1 TABLET BY MOUTH ONCE A DAY 10/20/21   [name], MD  QUEtiapine (SEROQUEL) 50 MG tablet TAKE 1 TABLET BY MOUTH DAILY AT BEDTIME 03/16/22   [name], MD    Physical Exam: BP (!) 140/98   Pulse 78   Temp 98 F (36.7 C) (Oral)   Resp 15   Ht 5' 5" (1.651 m)   Wt 68.9 kg   SpO2 97%   BMI 25.28 kg/m   General: 76 y.o. year-old female well developed well nourished in no acute distress.  Alert and oriented x3. Cardiovascular: Regular rate and rhythm with no rubs or gallops.  No thyromegaly or JVD noted.  No lower extremity edema. 2/4 pulses in all 4 extremities. Respiratory: Clear to auscultation with no wheezes or rales. Good inspiratory effort. Abdomen: Soft nontender nondistended with normal bowel sounds x4 quadrants. Muskuloskeletal: No cyanosis, clubbing or edema noted bilaterally Neuro: CN II-XII intact, strength, sensation, reflexes Skin: No ulcerative lesions noted or rashes.   Bruising on the back of the head. Psychiatry: Judgement and insight appear normal. Mood is appropriate for condition and setting          Labs on Admission:  Basic Metabolic Panel: Recent Labs  Lab 03/23/22 1844  NA 140  K 4.1  CL 115*  CO2 15*  GLUCOSE 135*  BUN 10  CREATININE 0.82  CALCIUM 7.8*   Liver Function Tests: No results for input(s): "AST", "ALT", "ALKPHOS", "BILITOT", "PROT", "ALBUMIN" in the last 168 hours. No results for input(s): "LIPASE", "AMYLASE" in the last 168 hours. No results for input(s): "AMMONIA" in the last 168 hours. CBC: Recent Labs  Lab 03/23/22  1844  WBC 10.0  NEUTROABS 8.6*  HGB 12.5  HCT 42.1  MCV 88.1  PLT 289   Cardiac Enzymes: No results for input(s): "CKTOTAL", "CKMB", "CKMBINDEX", "TROPONINI" in the last 168 hours.  BNP (last 3 results) No results for input(s): "BNP" in the last 8760 hours.  ProBNP (last 3 results) No results for input(s): "PROBNP" in the last 8760 hours.  CBG: No results for input(s): "GLUCAP" in the last 168 hours.  Radiological Exams on Admission: MR Brain Wo Contrast (neuro protocol)  Result Date: 03/23/2022 CLINICAL DATA:  Dizzy, vomiting EXAM: MRI HEAD WITHOUT CONTRAST TECHNIQUE: Multiplanar, multiecho pulse sequences of the brain and surrounding structures were obtained without intravenous contrast. COMPARISON:  01/03/2021 MRI head, correlation is also made with 03/23/2022 CT head FINDINGS: Brain: No restricted diffusion to suggest acute or subacute infarct. No acute hemorrhage, mass, mass effect, or midline shift. No hemosiderin deposition to suggest remote hemorrhage. No hydrocephalus or extra-axial collection. Degree of cerebral volume loss is somewhat advanced for age. T2 hyperintense signal in the periventricular white matter, likely the sequela of mild-to-moderate chronic small vessel ischemic disease. Vascular: Normal arterial flow voids. Skull and upper cervical spine: Normal marrow signal.  Sinuses/Orbits: Mucosal thickening in the inferior maxillary sinuses. The orbits are unremarkable. Other: Fluid in left mastoid air cells. IMPRESSION: No acute intracranial process. No evidence of acute or subacute infarct. Electronically Signed   By: Merilyn Baba M.D.   On: 03/23/2022 23:13   CT Head Wo Contrast  Result Date: 03/23/2022 CLINICAL DATA:  Blunt fall onto occiput and neck with nausea and vomiting EXAM: CT HEAD WITHOUT CONTRAST CT CERVICAL SPINE WITHOUT CONTRAST TECHNIQUE: Multidetector CT imaging of the head and cervical spine was performed following the standard protocol without intravenous contrast. Multiplanar CT image reconstructions of the cervical spine were also generated. RADIATION DOSE REDUCTION: This exam was performed according to the departmental dose-optimization program which includes automated exposure control, adjustment of the mA and/or kV according to patient size and/or use of iterative reconstruction technique. COMPARISON:  MRI head 01/03/2021 FINDINGS: CT HEAD FINDINGS Brain: No intracranial hemorrhage, mass effect, or evidence of acute infarct. No hydrocephalus. No extra-axial fluid collection. Generalized cerebral atrophy. Ill-defined hypoattenuation within the cerebral white matter is nonspecific but consistent with chronic small vessel ischemic disease. Vascular: No hyperdense vessel. Intracranial arterial calcification. Skull: No fracture or focal lesion. Small 4.5 x 1.2 cm hematoma near the posterior right paramidline occipital scalp. Sinuses/Orbits: No acute finding. Paranasal sinuses and mastoid air cells are well aerated. Other: None. CT CERVICAL SPINE FINDINGS Alignment: Loss of lordosis with slight anterolisthesis of C3 is chronic. Skull base and vertebrae: No acute fracture. No primary bone lesion or focal pathologic process. Soft tissues and spinal canal: No prevertebral fluid or swelling. No visible canal hematoma. Disc levels: Advanced spondylosis with bulky  anterior osteophytes, disc space height loss, and degenerative endplate changes at B0-F7. Posterior disc osteophyte complex at C5-C6 causes mild-moderate effacement of the anterior thecal sac greater on the left. Multilevel uncovertebral spurring and facet arthropathy cause advanced neural foraminal narrowing on the left at C3-C4 and on the left at C5-C6. Upper chest: Negative. Other: Aortic atherosclerotic calcification. IMPRESSION: 1. 1. No acute intracranial abnormality. 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 2. Small hematoma near the occipital scalp. No underlying fracture. 3. No acute fracture in the cervical spine. Multilevel degenerative spondylosis. Electronically Signed   By: Placido Sou M.D.   On: 03/23/2022 19:08   CT Cervical Spine  Wo Contrast  Result Date: 03/23/2022 CLINICAL DATA:  Blunt fall onto occiput and neck with nausea and vomiting EXAM: CT HEAD WITHOUT CONTRAST CT CERVICAL SPINE WITHOUT CONTRAST TECHNIQUE: Multidetector CT imaging of the head and cervical spine was performed following the standard protocol without intravenous contrast. Multiplanar CT image reconstructions of the cervical spine were also generated. RADIATION DOSE REDUCTION: This exam was performed according to the departmental dose-optimization program which includes automated exposure control, adjustment of the mA and/or kV according to patient size and/or use of iterative reconstruction technique. COMPARISON:  MRI head 01/03/2021 FINDINGS: CT HEAD FINDINGS Brain: No intracranial hemorrhage, mass effect, or evidence of acute infarct. No hydrocephalus. No extra-axial fluid collection. Generalized cerebral atrophy. Ill-defined hypoattenuation within the cerebral white matter is nonspecific but consistent with chronic small vessel ischemic disease. Vascular: No hyperdense vessel. Intracranial arterial calcification. Skull: No fracture or focal lesion. Small 4.5 x 1.2 cm hematoma near the posterior  right paramidline occipital scalp. Sinuses/Orbits: No acute finding. Paranasal sinuses and mastoid air cells are well aerated. Other: None. CT CERVICAL SPINE FINDINGS Alignment: Loss of lordosis with slight anterolisthesis of C3 is chronic. Skull base and vertebrae: No acute fracture. No primary bone lesion or focal pathologic process. Soft tissues and spinal canal: No prevertebral fluid or swelling. No visible canal hematoma. Disc levels: Advanced spondylosis with bulky anterior osteophytes, disc space height loss, and degenerative endplate changes at M3-W4. Posterior disc osteophyte complex at C5-C6 causes mild-moderate effacement of the anterior thecal sac greater on the left. Multilevel uncovertebral spurring and facet arthropathy cause advanced neural foraminal narrowing on the left at C3-C4 and on the left at C5-C6. Upper chest: Negative. Other: Aortic atherosclerotic calcification. IMPRESSION: 1. 1. No acute intracranial abnormality. 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 2. Small hematoma near the occipital scalp. No underlying fracture. 3. No acute fracture in the cervical spine. Multilevel degenerative spondylosis. Electronically Signed   By: Placido Sou M.D.   On: 03/23/2022 19:08    EKG: I independently viewed the EKG done and my findings are as followed: Sinus rhythm rate of 62, nonspecific ST-T changes.  QTc 444.  Assessment/Plan Present on Admission:  Post concussion syndrome  Principal Problem:   Post concussion syndrome  Post concussion syndrome, poa Dizziness and vomiting Post fall 1 day prior to presentation MRI negative for acute CVA PT/OT to assess Fall precautions Gentle IV fluid hydration LR at 50 cc/h x 2 days  Paroxysmal A-fib on Eliquis Rate controlled on home Toprol-XL Eliquis for CVA prevention Monitor on telemetry  Non anion gap Metabolic acidosis Serum bicarb 15 Anion gap of 10 Sodium bicarb tablet 1300 mg 3 times daily x3 doses Gentle  IV fluid hydration LR at 50 cc/h x 2 days  Hypocalcemia Repleted intravenously with 1 g calcium gluconate x1  Type 2 diabetes with hyperglycemia Last hemoglobin A1c 5.9 on 12/15/2021, previously 7.9 on 08/11/2020 Insulin sliding scale  Hyperlipidemia Resume home regimen  Chronic anxiety/depression Resume home regimen  Hypertension Resume home Norvasc and Toprol-XL  Monitor vital signs.    DVT prophylaxis: Eliquis  Code Status: Full code  Family Communication: None at bedside  Disposition Plan: Admitted to telemetry medical unit  Consults called: None  Admission status: Inpatient status.   Status is: Inpatient The patient requires at least 2 midnights for further evaluation and treatment of present condition.   Kayleen Memos MD [facility] Pager [phone]  If 7PM-7AM, please contact night-coverage [email or web address] Password [facility]  03/23/2022, 11:52 PM

## 2022-03-23 NOTE — ED Notes (Signed)
Back from CT

## 2022-03-24 ENCOUNTER — Ambulatory Visit: Payer: Medicare Other | Admitting: Surgical

## 2022-03-24 DIAGNOSIS — F0781 Postconcussional syndrome: Secondary | ICD-10-CM | POA: Diagnosis not present

## 2022-03-24 LAB — CBC
HCT: 34.3 % — ABNORMAL LOW (ref 36.0–46.0)
Hemoglobin: 10.7 g/dL — ABNORMAL LOW (ref 12.0–15.0)
MCH: 26.4 pg (ref 26.0–34.0)
MCHC: 31.2 g/dL (ref 30.0–36.0)
MCV: 84.5 fL (ref 80.0–100.0)
Platelets: 325 10*3/uL (ref 150–400)
RBC: 4.06 MIL/uL (ref 3.87–5.11)
RDW: 16.9 % — ABNORMAL HIGH (ref 11.5–15.5)
WBC: 10 10*3/uL (ref 4.0–10.5)
nRBC: 0 % (ref 0.0–0.2)

## 2022-03-24 LAB — HEMOGLOBIN A1C
Hgb A1c MFr Bld: 6 % — ABNORMAL HIGH (ref 4.8–5.6)
Mean Plasma Glucose: 125.5 mg/dL

## 2022-03-24 LAB — BASIC METABOLIC PANEL
Anion gap: 12 (ref 5–15)
BUN: 12 mg/dL (ref 8–23)
CO2: 21 mmol/L — ABNORMAL LOW (ref 22–32)
Calcium: 9.2 mg/dL (ref 8.9–10.3)
Chloride: 105 mmol/L (ref 98–111)
Creatinine, Ser: 1.04 mg/dL — ABNORMAL HIGH (ref 0.44–1.00)
GFR, Estimated: 55 mL/min — ABNORMAL LOW (ref >60)
Glucose, Bld: 122 mg/dL — ABNORMAL HIGH (ref 70–99)
Potassium: 4 mmol/L (ref 3.5–5.1)
Sodium: 138 mmol/L (ref 135–145)

## 2022-03-24 LAB — CBG MONITORING, ED
Glucose-Capillary: 120 mg/dL — ABNORMAL HIGH (ref 70–99)
Glucose-Capillary: 108 mg/dL — ABNORMAL HIGH (ref 70–99)
Glucose-Capillary: 117 mg/dL — ABNORMAL HIGH (ref 70–99)
Glucose-Capillary: 135 mg/dL — ABNORMAL HIGH (ref 70–99)

## 2022-03-24 LAB — MAGNESIUM: Magnesium: 1.5 mg/dL — ABNORMAL LOW (ref 1.7–2.4)

## 2022-03-24 LAB — GLUCOSE, CAPILLARY: Glucose-Capillary: 108 mg/dL — ABNORMAL HIGH (ref 70–99)

## 2022-03-24 LAB — PHOSPHORUS: Phosphorus: 4 mg/dL (ref 2.5–4.6)

## 2022-03-24 MED ORDER — DIPHENHYDRAMINE HCL 50 MG/ML IJ SOLN
12.5000 mg | INTRAMUSCULAR | Status: AC
  Administered 2022-03-24: 12.5 mg via INTRAVENOUS
  Filled 2022-03-24: qty 1

## 2022-03-24 MED ORDER — KETOROLAC TROMETHAMINE 15 MG/ML IJ SOLN
15.0000 mg | INTRAMUSCULAR | Status: AC
  Administered 2022-03-24: 15 mg via INTRAVENOUS
  Filled 2022-03-24: qty 1

## 2022-03-24 MED ORDER — MAGNESIUM SULFATE 2 GM/50ML IV SOLN
2.0000 g | Freq: Once | INTRAVENOUS | Status: AC
  Administered 2022-03-24: 2 g via INTRAVENOUS
  Filled 2022-03-24: qty 50

## 2022-03-24 MED ORDER — SODIUM BICARBONATE 650 MG PO TABS
1300.0000 mg | ORAL_TABLET | Freq: Three times a day (TID) | ORAL | Status: AC
  Administered 2022-03-24 (×3): 1300 mg via ORAL
  Filled 2022-03-24 (×5): qty 2

## 2022-03-24 MED ORDER — LACTATED RINGERS IV SOLN: INTRAVENOUS | Status: DC

## 2022-03-24 MED ORDER — OXYCODONE HCL 5 MG PO TABS
5.0000 mg | ORAL_TABLET | Freq: Once | ORAL | Status: AC
  Administered 2022-03-24: 5 mg via ORAL
  Filled 2022-03-24: qty 1

## 2022-03-24 MED ORDER — INFLUENZA VAC A&B SA ADJ QUAD 0.5 ML IM PRSY
0.5000 mL | PREFILLED_SYRINGE | INTRAMUSCULAR | Status: AC
  Administered 2022-03-27: 0.5 mL via INTRAMUSCULAR
  Filled 2022-03-24: qty 0.5

## 2022-03-24 MED ORDER — METOPROLOL SUCCINATE ER 25 MG PO TB24
12.5000 mg | ORAL_TABLET | Freq: Every day | ORAL | Status: DC
  Administered 2022-03-24 – 2022-03-27 (×4): 12.5 mg via ORAL
  Filled 2022-03-24 (×4): qty 1

## 2022-03-24 MED ORDER — METOCLOPRAMIDE HCL 5 MG/ML IJ SOLN
5.0000 mg | INTRAMUSCULAR | Status: AC
  Administered 2022-03-24: 5 mg via INTRAVENOUS
  Filled 2022-03-24: qty 2

## 2022-03-24 MED ORDER — CALCIUM GLUCONATE-NACL 1-0.675 GM/50ML-% IV SOLN
1.0000 g | Freq: Once | INTRAVENOUS | Status: AC
  Administered 2022-03-24: 1000 mg via INTRAVENOUS
  Filled 2022-03-24: qty 50

## 2022-03-24 NOTE — Progress Notes (Signed)
Kathryn Wall  YCX:[id number] DOB: [birth year] DOA: 03/23/2022 PCP: [name], MD    Brief Narrative:  76 year old with a history of HTN, HLD, breast cancer status postlumpectomy and XRT, DM2, and PAF on Eliquis who fell 1 day prior striking the back of her head.  She did not lose consciousness at that time, and EMS arrived at her home, but she declined transport.  Since that time she has developed constant persisting dizziness with intermittent vomiting.  While in the ED she was unable to stand without severe dizziness and citing nausea with vomiting.  Meclizine did not prove to be helpful.   Consultants:  None  Goals of Care:  Code Status: Full Code   DVT prophylaxis: Eliquis  Interim Hx: Afebrile.  Vital signs stable.  Dizziness persists, though it is slightly improved since admission per her report.  Patient did poorly with therapy evaluations.  Denies chest pain shortness of breath or nausea.  Assessment & Plan:  Concussion -postconcussion syndrome -dizziness with vomiting MRA brain negative for acute findings -CT head and cervical spine without acute findings -patient did appear to be dehydrated at time of presentation -continue gentle IV fluid -continue PT/OT with vestibular evaluation  Paroxysmal atrial fibrillation on Eliquis Continue Toprol and Eliquis -rate controlled  Hypocalcemia Corrected with supplementation  Hypomagnesemia Likely due to GI losses -supplement and follow  DM2 A1c 5.04 December 2021 -A1c 6.0 this admission  HTN Reasonably well controlled -follow for now without change in regimen  HLD Continue usual home medication  Chronic anxiety/depression Continue usual home medication  Family Communication: No family present at time of exam Disposition: Hopeful for discharge home if does well with therapy   Objective: Blood pressure (!) 134/103, pulse 66, temperature 98.4 F (36.9 C), temperature source Oral, resp. rate 20, height '5\' 5"'$  (1.651  m), weight 68.9 kg, SpO2 98 %. No intake or output data in the 24 hours ending 03/24/22 0738 Filed Weights   03/23/22 1810  Weight: 68.9 kg    Examination: General: No acute respiratory distress Lungs: Clear to auscultation bilaterally without wheezes or crackles Cardiovascular: Regular rate and rhythm without murmur gallop or rub normal S1 and S2 Abdomen: Nontender, nondistended, soft, bowel sounds positive, no rebound, no ascites, no appreciable mass Extremities: No significant cyanosis, clubbing, or edema bilateral lower extremities  CBC: Recent Labs  Lab 03/23/22 1844 03/24/22 0411  WBC 10.0 10.0  NEUTROABS 8.6*  --   HGB 12.5 10.7*  HCT 42.1 34.3*  MCV 88.1 84.5  PLT 289 497   Basic Metabolic Panel: Recent Labs  Lab 03/23/22 1844 03/24/22 0411  NA 140 138  K 4.1 4.0  CL 115* 105  CO2 15* 21*  GLUCOSE 135* 122*  BUN 10 12  CREATININE 0.82 1.04*  CALCIUM 7.8* 9.2  MG  --  1.5*  PHOS  --  4.0   GFR: Estimated Creatinine Clearance: 43.5 mL/min (A) (by C-G formula based on SCr of 1.04 mg/dL (H)).   Scheduled Meds:  amLODipine  5 mg Oral Daily   apixaban  5 mg Oral BID   atorvastatin  40 mg Oral Daily   insulin aspart  0-5 Units Subcutaneous QHS   insulin aspart  0-9 Units Subcutaneous TID WC   metoprolol succinate  12.5 mg Oral Daily   pantoprazole  40 mg Oral QHS   PARoxetine  20 mg Oral Daily   QUEtiapine  50 mg Oral QHS   sodium bicarbonate  1,300 mg Oral TID  Continuous Infusions:  lactated ringers 50 mL/hr at 03/24/22 0407     LOS: 1 day   Cherene Altes, MD [facility] Office  [phone] Pager - Text Page per Amion  If 7PM-7AM, please contact night-coverage per Amion 03/24/2022, 7:38 AM

## 2022-03-24 NOTE — Evaluation (Signed)
Occupational Therapy Evaluation Patient Details Name: Kathryn Wall MRN: [id number] DOB: 01/13/1946 Today's Date: 03/24/2022   History of Present Illness Pt is a 76 y/o female admitted due to dizziness and fall at home where she hit her head. MRI negative for acute abnormalities though concussion suspected. PMH: HTN, HLD, breast cancer w/ lumpectomy and radiation, DM2, a fib   Clinical Impression   PTA, pt lives alone, reports Modified Independent with ADLs, IADLs, driving and mobility using cane vs Rollator. Pt presents now with deficits in balance, strength, endurance and cognition. Pt requires Min A for bed mobility and standing at bedside though unable to progress away from stretcher due to dizziness. Pt requires Setup for UB ADL and Mod-Max A for LB ADLs d/t deficits. Educated re: concussion precautions, use of visual anchor if room spinning. Due to dizziness and being at increased risk for falls, recommend SNF rehab at [place]. However, if dizziness resolves and pt able to demonstrate safe ambulation of household distances, may be able to progress home.       Recommendations for follow up therapy are one component of a multi-disciplinary discharge planning process, led by the attending physician.  Recommendations may be updated based on patient status, additional functional criteria and insurance authorization.   Follow Up Recommendations  Skilled nursing-short term rehab (<3 hours/day) (if able to ambulate household distances safely, may can progress to [facility])    Assistance Recommended at Discharge Frequent or constant Supervision/Assistance  Patient can return home with the following A lot of help with walking and/or transfers;A lot of help with bathing/dressing/bathroom;Assistance with cooking/housework;Assist for transportation    Functional Status Assessment  Patient has had a recent decline in their functional status and demonstrates the ability to make significant improvements in  function in a reasonable and predictable amount of time.  Equipment Recommendations  None recommended by OT    Recommendations for Other Services       Precautions / Restrictions Precautions Precautions: Fall Restrictions Weight Bearing Restrictions: No      Mobility Bed Mobility Overal bed mobility: Needs Assistance Bed Mobility: Supine to Sit, Sit to Supine     Supine to sit: Min assist, HOB elevated Sit to supine: Min assist   General bed mobility comments: light assist to bring LEs to EOB and lift trunk. Min A for LE back to bed    Transfers Overall transfer level: Needs assistance Equipment used: 1 person hand held assist Transfers: Sit to/from Stand Sit to Stand: Min assist           General transfer comment: steadying assist to stand at stretcher side, unable to progress away from stretcher d/t dizziness      Balance Overall balance assessment: Needs assistance, History of Falls Sitting-balance support: No upper extremity supported, Feet supported Sitting balance-Leahy Scale: Fair     Standing balance support: Single extremity supported, During functional activity Standing balance-Leahy Scale: Poor                             ADL either performed or assessed with clinical judgement   ADL Overall ADL's : Needs assistance/impaired Eating/Feeding: Independent   Grooming: Set up;Sitting   Upper Body Bathing: Set up;Sitting   Lower Body Bathing: Moderate assistance;Sit to/from stand   Upper Body Dressing : Set up   Lower Body Dressing: Maximal assistance;Sit to/from stand       Toileting- Water quality scientist and Hygiene: Maximal assistance;Sit to/from stand  General ADL Comments: Pt limited by dizziness in sitting, standing and with quick movements. encouraged slow transitional movements throughout. pt with some difficulty explaining symptoms associated with dizziness, at increased risk for falls     Vision Baseline  Vision/History: 1 Wears glasses Ability to See in Adequate Light: 1 Impaired Patient Visual Report: No change from baseline Vision Assessment?: No apparent visual deficits     Perception     Praxis      Pertinent Vitals/Pain Pain Assessment Pain Assessment: No/denies pain     Hand Dominance Right   Extremity/Trunk Assessment Upper Extremity Assessment Upper Extremity Assessment: Generalized weakness   Lower Extremity Assessment Lower Extremity Assessment: Defer to PT evaluation   Cervical / Trunk Assessment Cervical / Trunk Assessment: Normal   Communication Communication Communication: No difficulties   Cognition Arousal/Alertness: Awake/alert Behavior During Therapy: WFL for tasks assessed/performed Overall Cognitive Status: No family/caregiver present to determine baseline cognitive functioning                                 General Comments: some decreased insight into deficits, questionable memory deficits. functional, pleasant and follows directions consistently     General Comments  Systolic BP 161W after activity.    Exercises     Shoulder Instructions      Home Living Family/patient expects to be discharged to:: Private residence Living Arrangements: Alone Available Help at Discharge: Family;Friend(s);Available PRN/intermittently (both daughters work day shift) Type of Home: House Home Access: Ramped entrance     [place]: One level     Bathroom Shower/Tub: Teacher, early years/pre: Handicapped height     Home Equipment: [place] (4 wheels);Cane - single point;Shower seat;BSC/3in1;Rolling Environmental consultant (2 wheels);Wheelchair - manual          Prior Functioning/Environment Prior Level of Function : Independent/Modified Independent;Driving             Mobility Comments: uses Rollator vs cane ADLs Comments: MOD I for ADLs, IADLs, driving and grocery shopping        OT Problem List: Decreased strength;Decreased  activity tolerance;Impaired balance (sitting and/or standing);Decreased cognition;Decreased safety awareness      OT Treatment/Interventions: Self-care/ADL training;Therapeutic exercise;Energy conservation;DME and/or AE instruction;Therapeutic activities;Patient/family education;Balance training    OT Goals(Current goals can be found in the care plan section) Acute Rehab OT Goals Patient Stated Goal: for dizziness to get better, go home if able to OT Goal Formulation: With patient Time For Goal Achievement: 04/07/22 Potential to Achieve Goals: Good ADL Goals Pt Will Perform Grooming: with modified independence;standing Pt Will Perform Lower Body Bathing: with supervision;sitting/lateral leans;sit to/from stand Pt Will Perform Lower Body Dressing: with supervision;sit to/from stand;sitting/lateral leans Pt Will Transfer to Toilet: with supervision;ambulating  OT Frequency: Min 2X/week    Co-evaluation              AM-PAC OT "6 Clicks" Daily Activity     Outcome Measure Help from another person eating meals?: None Help from another person taking care of personal grooming?: A Little Help from another person toileting, which includes using toliet, bedpan, or urinal?: A Lot Help from another person bathing (including washing, rinsing, drying)?: A Lot Help from another person to put on and taking off regular upper body clothing?: A Little Help from another person to put on and taking off regular lower body clothing?: A Lot 6 Click Score: 16   End of Session Nurse Communication: Mobility status  Activity Tolerance: Treatment limited secondary to medical complications (Comment) Patient left: in bed;with call bell/phone within reach  OT Visit Diagnosis: Unsteadiness on feet (R26.81);Other abnormalities of gait and mobility (R26.89);History of falling (Z91.81);Dizziness and giddiness (R42)                Time: 1116-1130 OT Time Calculation (min): 14 min Charges:  OT General  Charges $OT Visit: 1 Visit OT Evaluation $OT Eval Low Complexity: 1 Low  Malachy Chamber, OTR/L Acute Rehab Services Office: [phone]   Layla Maw 03/24/2022, 12:25 PM

## 2022-03-24 NOTE — Evaluation (Signed)
Physical Therapy Evaluation Patient Details Name: Kathryn Wall MRN: [id number] DOB: Sep 27, 1945 Today's Date: 03/24/2022  History of Present Illness  Pt is a 76 y/o female admitted due to dizziness and fall at home where she hit her head. MRI negative for acute abnormalities though concussion suspected. PMH: HTN, HLD, breast cancer w/ lumpectomy and radiation, DM2, a fib  Clinical Impression  Pt admitted secondary to problem above with deficits below. Pt requiring min A to stand and take steps at EOB. Continues to report dizziness, especially with bed mobility. Did note nystagmus at end range of horizontal visual tracking to L and R. Would likely benefit from further vestibular assessment. Will need to progress mobility to determine most appropriate d/c recommendations. Will continue to follow acutely.        Recommendations for follow up therapy are one component of a multi-disciplinary discharge planning process, led by the attending physician.  Recommendations may be updated based on patient status, additional functional criteria and insurance authorization.  Follow Up Recommendations Other (comment) (TBD pending progression)      Assistance Recommended at Discharge Frequent or constant Supervision/Assistance  Patient can return home with the following  A little help with walking and/or transfers;A little help with bathing/dressing/bathroom;Assistance with cooking/housework;Help with stairs or ramp for entrance;Assist for transportation    Equipment Recommendations Other (comment) (TBD)  Recommendations for Other Services       Functional Status Assessment Patient has had a recent decline in their functional status and demonstrates the ability to make significant improvements in function in a reasonable and predictable amount of time.     Precautions / Restrictions Precautions Precautions: Fall Restrictions Weight Bearing Restrictions: No      Mobility  Bed Mobility Overal  bed mobility: Needs Assistance Bed Mobility: Supine to Sit, Sit to Supine     Supine to sit: Min assist, HOB elevated Sit to supine: Min assist   General bed mobility comments: light assist to bring LEs to EOB and lift trunk. Min A for LE back to bed. Pt reporting dizziness with both position changes.    Transfers Overall transfer level: Needs assistance Equipment used: 1 person hand held assist Transfers: Sit to/from Stand Sit to Stand: Min assist           General transfer comment: steadying assist to stand. Pt reports dizziness improved from OT session    Ambulation/Gait Ambulation/Gait assistance: Min assist Gait Distance (Feet): 3 Feet Assistive device: 1 person hand held assist Gait Pattern/deviations: Step-through pattern, Decreased stride length Gait velocity: Decreased     General Gait Details: took steps forward and back at EOB. Min A for steadying. Mild dizziness reported  Stairs            Wheelchair Mobility    Modified Rankin (Stroke Patients Only)       Balance Overall balance assessment: Needs assistance, History of Falls Sitting-balance support: No upper extremity supported, Feet supported Sitting balance-Leahy Scale: Fair     Standing balance support: Single extremity supported, During functional activity Standing balance-Leahy Scale: Poor                               Pertinent Vitals/Pain Pain Assessment Pain Assessment: 0-10 Pain Score: 5  Pain Location: headache Pain Descriptors / Indicators: Headache Pain Intervention(s): Limited activity within patient's tolerance, Monitored during session, Repositioned    Home Living Family/patient expects to be discharged to:: Private residence Living Arrangements: Alone  Available Help at Discharge: Family;Friend(s);Available PRN/intermittently (both daughters work day shift) Type of Home: House Home Access: Ramped entrance       Home Layout: One level Home Equipment:  [place] (4 wheels);Cane - single point;Shower seat;BSC/3in1;Rolling Environmental consultant (2 wheels);Wheelchair - manual      Prior Function Prior Level of Function : Independent/Modified Independent;Driving             Mobility Comments: uses Rollator vs cane ADLs Comments: MOD I for ADLs, IADLs, driving and grocery shopping     Hand Dominance   Dominant Hand: Right    Extremity/Trunk Assessment   Upper Extremity Assessment Upper Extremity Assessment: Defer to OT evaluation    Lower Extremity Assessment Lower Extremity Assessment: Generalized weakness    Cervical / Trunk Assessment Cervical / Trunk Assessment: Normal  Communication   Communication: No difficulties  Cognition Arousal/Alertness: Awake/alert Behavior During Therapy: WFL for tasks assessed/performed Overall Cognitive Status: No family/caregiver present to determine baseline cognitive functioning                                          General Comments General comments (skin integrity, edema, etc.): Noted nystagmus at end range with horizontal visual tracking.    Exercises     Assessment/Plan    PT Assessment Patient needs continued PT services  PT Problem List Decreased strength;Decreased activity tolerance;Decreased balance;Decreased mobility;Decreased knowledge of use of DME;Decreased knowledge of precautions       PT Treatment Interventions DME instruction;Gait training;Stair training;Functional mobility training;Therapeutic activities;Therapeutic exercise;Balance training;Patient/family education    PT Goals (Current goals can be found in the Care Plan section)  Acute Rehab PT Goals Patient Stated Goal: to feel better PT Goal Formulation: With patient Time For Goal Achievement: 04/07/22 Potential to Achieve Goals: Good    Frequency Min 3X/week     Co-evaluation               AM-PAC PT "6 Clicks" Mobility  Outcome Measure Help needed turning from your back to your side  while in a flat bed without using bedrails?: A Little Help needed moving from lying on your back to sitting on the side of a flat bed without using bedrails?: A Little Help needed moving to and from a bed to a chair (including a wheelchair)?: A Little Help needed standing up from a chair using your arms (e.g., wheelchair or bedside chair)?: A Little Help needed to walk in hospital room?: A Little Help needed climbing 3-5 steps with a railing? : A Lot 6 Click Score: 17    End of Session Equipment Utilized During Treatment: Gait belt Activity Tolerance: Patient tolerated treatment well Patient left: in bed;with call bell/phone within reach (on stretcher in ED) Nurse Communication: Mobility status PT Visit Diagnosis: Unsteadiness on feet (R26.81);Muscle weakness (generalized) (M62.81);History of falling (Z91.81)    Time: 1457-1510 PT Time Calculation (min) (ACUTE ONLY): 13 min   Charges:   PT Evaluation $PT Eval Moderate Complexity: 1 Mod          Reuel Derby, PT, DPT  Acute Rehabilitation Services  Office: [phone]   Rudean Hitt 03/24/2022, 4:24 PM

## 2022-03-24 NOTE — Progress Notes (Signed)
Pt reports headache, 7/10, unrelieved by tylenol and is requesting stronger pain medication. Oxycodone 5 mg x1 ordered per Abigail Chavez-NP. Will monitor for effects.

## 2022-03-25 DIAGNOSIS — F0781 Postconcussional syndrome: Secondary | ICD-10-CM | POA: Diagnosis not present

## 2022-03-25 LAB — CBC
HCT: 32.1 % — ABNORMAL LOW (ref 36.0–46.0)
Hemoglobin: 10.2 g/dL — ABNORMAL LOW (ref 12.0–15.0)
MCH: 26.8 pg (ref 26.0–34.0)
MCHC: 31.8 g/dL (ref 30.0–36.0)
MCV: 84.3 fL (ref 80.0–100.0)
Platelets: 275 10*3/uL (ref 150–400)
RBC: 3.81 MIL/uL — ABNORMAL LOW (ref 3.87–5.11)
RDW: 17.1 % — ABNORMAL HIGH (ref 11.5–15.5)
WBC: 8.2 10*3/uL (ref 4.0–10.5)
nRBC: 0 % (ref 0.0–0.2)

## 2022-03-25 LAB — IRON AND TIBC
Iron: 27 ug/dL — ABNORMAL LOW (ref 28–170)
Saturation Ratios: 8 % — ABNORMAL LOW (ref 10.4–31.8)
TIBC: 349 ug/dL (ref 250–450)
UIBC: 322 ug/dL

## 2022-03-25 LAB — BASIC METABOLIC PANEL
Anion gap: 9 (ref 5–15)
BUN: 19 mg/dL (ref 8–23)
CO2: 24 mmol/L (ref 22–32)
Calcium: 8.4 mg/dL — ABNORMAL LOW (ref 8.9–10.3)
Chloride: 101 mmol/L (ref 98–111)
Creatinine, Ser: 1.46 mg/dL — ABNORMAL HIGH (ref 0.44–1.00)
GFR, Estimated: 37 mL/min — ABNORMAL LOW (ref >60)
Glucose, Bld: 142 mg/dL — ABNORMAL HIGH (ref 70–99)
Potassium: 4.1 mmol/L (ref 3.5–5.1)
Sodium: 134 mmol/L — ABNORMAL LOW (ref 135–145)

## 2022-03-25 LAB — FOLATE: Folate: 7.9 ng/mL (ref >5.9)

## 2022-03-25 LAB — RETICULOCYTES
Immature Retic Fract: 27.3 % — ABNORMAL HIGH (ref 2.3–15.9)
RBC.: 3.81 MIL/uL — ABNORMAL LOW (ref 3.87–5.11)
Retic Count, Absolute: 62.1 10*3/uL (ref 19.0–186.0)
Retic Ct Pct: 1.6 % (ref 0.4–3.1)

## 2022-03-25 LAB — FERRITIN: Ferritin: 11 ng/mL (ref 11–307)

## 2022-03-25 LAB — MAGNESIUM: Magnesium: 2 mg/dL (ref 1.7–2.4)

## 2022-03-25 LAB — GLUCOSE, CAPILLARY
Glucose-Capillary: 127 mg/dL — ABNORMAL HIGH (ref 70–99)
Glucose-Capillary: 156 mg/dL — ABNORMAL HIGH (ref 70–99)
Glucose-Capillary: 136 mg/dL — ABNORMAL HIGH (ref 70–99)
Glucose-Capillary: 113 mg/dL — ABNORMAL HIGH (ref 70–99)

## 2022-03-25 LAB — VITAMIN B12: Vitamin B-12: 206 pg/mL (ref 180–914)

## 2022-03-25 MED ORDER — OXYCODONE HCL 5 MG PO TABS
5.0000 mg | ORAL_TABLET | Freq: Once | ORAL | Status: AC
  Administered 2022-03-25: 5 mg via ORAL
  Filled 2022-03-25: qty 1

## 2022-03-25 MED ORDER — CALCIUM CARBONATE ANTACID 500 MG PO CHEW: 1.0000 | CHEWABLE_TABLET | Freq: Two times a day (BID) | ORAL | Status: DC

## 2022-03-25 MED ORDER — SODIUM CHLORIDE 0.9 % IV SOLN
510.0000 mg | Freq: Once | INTRAVENOUS | Status: AC
  Administered 2022-03-25: 510 mg via INTRAVENOUS
  Filled 2022-03-25: qty 17

## 2022-03-25 MED ORDER — CYANOCOBALAMIN 1000 MCG/ML IJ SOLN
1000.0000 ug | Freq: Every day | INTRAMUSCULAR | Status: AC
  Administered 2022-03-25 – 2022-03-27 (×3): 1000 ug via SUBCUTANEOUS
  Filled 2022-03-25 (×4): qty 1

## 2022-03-25 MED ORDER — CALCIUM CARBONATE 1250 (500 CA) MG PO TABS
1.0000 | ORAL_TABLET | Freq: Two times a day (BID) | ORAL | Status: DC
  Administered 2022-03-26 – 2022-03-27 (×2): 1250 mg via ORAL
  Filled 2022-03-25 (×3): qty 1

## 2022-03-25 NOTE — Progress Notes (Signed)
Kathryn Wall  PFY:[id number] DOB: October 02, 1945 DOA: 03/23/2022 PCP: [name], MD    Brief Narrative:  76 year old with a history of HTN, HLD, breast cancer status postlumpectomy and XRT, DM2, and PAF on Eliquis who fell 1 day prior striking the back of her head.  She did not lose consciousness at that time, and EMS arrived at her home, but she declined transport.  Since that time she has developed constant persisting dizziness with intermittent vomiting.  While in the ED she was unable to stand without severe dizziness and citing nausea with vomiting.  Meclizine did not prove to be helpful.   Consultants:  None  Goals of Care:  Code Status: Full Code   DVT prophylaxis: Eliquis  Interim Hx: No acute events reported overnight.  Afebrile with vital signs stable.  Reports ongoing vertigo, but feels that it is slowly improving.  PT/OT has suggested a rehab stay but patient tells me she is not interested and plans to go home.  Assessment & Plan:  Concussion -postconcussion syndrome -dizziness with vomiting MRA brain negative for acute findings -CT head and cervical spine without acute findings -patient did appear to be dehydrated at time of presentation -continue gentle IV fluid -continue PT/OT with vestibular tx  Paroxysmal atrial fibrillation on Eliquis Continue Toprol and Eliquis -rate controlled  Acute kidney injury Creatinine 0.82 at presentation and during hospital stay has trended up to 1.46 -likely simply due to poor intake and dehydration related to recurrent vomiting -continue to hydrate and follow  Mild hyponatremia Appears to be hypovolemic in etiology -continue volume resuscitation  B12 deficiency Vitamin B12 206 -initiate replacement and follow-up in 8-12 weeks as outpatient  Hypocalcemia Corrected with supplementation -continue daily supplementation  Iron deficiency anemia Ferritin 11, iron 27, and TIBC of 349 -patient confirms she has a colonoscopy every 5  years, though she admits she is now due -encouraged her to schedule this soon in the outpatient setting -loaded with IV iron during this admission  Hypomagnesemia Likely due to GI losses -corrected with supplementation  DM2 A1c 5.04 December 2021 - A1c 6.0 this admission -CBG controlled  HTN Reasonably well controlled -follow for now without change in regimen  HLD Continue usual home medication  Chronic anxiety/depression Continue usual home medication  Family Communication: No family present at time of exam Disposition: Anticipate discharge home, likely 9/29 depending on how she performs with therapy   Objective: Blood pressure (!) 157/78, pulse 65, temperature 98.1 F (36.7 C), temperature source Oral, resp. rate 17, height '5\' 5"'$  (1.651 m), weight 75.4 kg, SpO2 96 %.  Intake/Output Summary (Last 24 hours) at 03/25/2022 1021 Last data filed at 03/25/2022 0443 Gross per 24 hour  Intake --  Output 600 ml  Net -600 ml   Filed Weights   03/23/22 1810 03/24/22 2146  Weight: 68.9 kg 75.4 kg    Examination: General: No acute respiratory distress Lungs: Clear to auscultation bilaterally without wheezes or crackles Cardiovascular: Regular rate and rhythm without murmur  Abdomen: Nontender, nondistended, soft, bowel sounds positive, no rebound Extremities: No significant cyanosis, clubbing, or edema bilateral lower extremities  CBC: Recent Labs  Lab 03/23/22 1844 03/24/22 0411 03/25/22 0302  WBC 10.0 10.0 8.2  NEUTROABS 8.6*  --   --   HGB 12.5 10.7* 10.2*  HCT 42.1 34.3* 32.1*  MCV 88.1 84.5 84.3  PLT 289 325 817    Basic Metabolic Panel: Recent Labs  Lab 03/23/22 1844 03/24/22 0411 03/25/22 0302  NA  140 138 134*  K 4.1 4.0 4.1  CL 115* 105 101  CO2 15* 21* 24  GLUCOSE 135* 122* 142*  BUN '10 12 19  '$ CREATININE 0.82 1.04* 1.46*  CALCIUM 7.8* 9.2 8.4*  MG  --  1.5* 2.0  PHOS  --  4.0  --     GFR: Estimated Creatinine Clearance: 32.3 mL/min (A) (by C-G  formula based on SCr of 1.46 mg/dL (H)).   Scheduled Meds:  amLODipine  5 mg Oral Daily   apixaban  5 mg Oral BID   atorvastatin  40 mg Oral Daily   influenza vaccine adjuvanted  0.5 mL Intramuscular Tomorrow-1000   insulin aspart  0-5 Units Subcutaneous QHS   insulin aspart  0-9 Units Subcutaneous TID WC   metoprolol succinate  12.5 mg Oral Daily   pantoprazole  40 mg Oral QHS   PARoxetine  20 mg Oral Daily   QUEtiapine  50 mg Oral QHS   Continuous Infusions:  lactated ringers 50 mL/hr at 03/24/22 2228     LOS: 2 days   Cherene Altes, MD [facility] Office  [phone] Pager - Text Page per Shea Evans  If 7PM-7AM, please contact night-coverage per Amion 03/25/2022, 10:21 AM

## 2022-03-25 NOTE — Progress Notes (Signed)
Physical Therapy Treatment Patient Details Name: Kathryn Wall MRN: [id number] DOB: December 20, 1945 Today's Date: 03/25/2022   History of Present Illness Pt is a 76 y/o female admitted due to dizziness and fall at home where she hit her head. MRI negative for acute abnormalities though concussion suspected. PMH: HTN, HLD, breast cancer w/ lumpectomy and radiation, DM2, a fib    PT Comments    Pt admitted with above diagnosis. Pt positive for right posterior canal BPPV and treated with Epley maneuver. Pt dry heaving after treatment completed and had to lie pt back down. Pt reports feeling a little better a few min after treatment. Will check back in am to see if pt needs repeat treatment.  Pt currently with functional limitations due to balance and endurance deficits. Pt will benefit from skilled PT to increase their independence and safety with mobility to allow discharge to the venue listed below.      Recommendations for follow up therapy are one component of a multi-disciplinary discharge planning process, led by the attending physician.  Recommendations may be updated based on patient status, additional functional criteria and insurance authorization.  Follow Up Recommendations  Skilled nursing-short term rehab (<3 hours/day) (for vestibular rehab) Can patient physically be transported by private vehicle: Yes   Assistance Recommended at Discharge Frequent or constant Supervision/Assistance  Patient can return home with the following A little help with walking and/or transfers;A little help with bathing/dressing/bathroom;Assistance with cooking/housework;Help with stairs or ramp for entrance;Assist for transportation   Equipment Recommendations  None recommended by PT    Recommendations for Other Services       Precautions / Restrictions Precautions Precautions: Fall Restrictions Weight Bearing Restrictions: No     Mobility  Bed Mobility Overal bed mobility: Needs Assistance Bed  Mobility: Supine to Sit, Sit to Supine     Supine to sit: Min assist, HOB elevated Sit to supine: Min assist   General bed mobility comments: Pt postiive for right BPPV.  Treated wtih Epley maneuver for right posterior canal BPPV.  Pt needed light assist to bring LEs to EOB and lift trunk. Min A for LE back to bed. Pt dry heaving after treatment therefore got her positioned in bed with HOB 30 degrees to decr nausea.  Pt settled and felt better on PT departure.    Transfers                        Ambulation/Gait                   Stairs             Wheelchair Mobility    Modified Rankin (Stroke Patients Only)       Balance Overall balance assessment: Needs assistance, History of Falls Sitting-balance support: No upper extremity supported, Feet supported, Bilateral upper extremity supported Sitting balance-Leahy Scale: Poor Sitting balance - Comments: relied on UE support on EOB due to dizziness                                    Cognition Arousal/Alertness: Awake/alert Behavior During Therapy: WFL for tasks assessed/performed Overall Cognitive Status: No family/caregiver present to determine baseline cognitive functioning                                 General Comments: some  decreased insight into deficits, questionable memory deficits. functional, pleasant and follows directions consistently        Exercises      General Comments        Pertinent Vitals/Pain Pain Assessment Pain Assessment: No/denies pain    Home Living                          Prior Function            PT Goals (current goals can now be found in the care plan section) Acute Rehab PT Goals Patient Stated Goal: to feel better Progress towards PT goals: Not progressing toward goals - comment (incr dizziness post Epley)    Frequency    Min 3X/week      PT Plan      Co-evaluation              AM-PAC PT "6  Clicks" Mobility   Outcome Measure  Help needed turning from your back to your side while in a flat bed without using bedrails?: A Little Help needed moving from lying on your back to sitting on the side of a flat bed without using bedrails?: A Little Help needed moving to and from a bed to a chair (including a wheelchair)?: A Little Help needed standing up from a chair using your arms (e.g., wheelchair or bedside chair)?: A Lot Help needed to walk in hospital room?: A Lot Help needed climbing 3-5 steps with a railing? : A Lot 6 Click Score: 15    End of Session Equipment Utilized During Treatment: Gait belt Activity Tolerance: Patient limited by fatigue (limited by dizziness) Patient left: in bed;with call bell/phone within reach;with bed alarm set Nurse Communication: Mobility status PT Visit Diagnosis: Unsteadiness on feet (R26.81);Muscle weakness (generalized) (M62.81);History of falling (Z91.81)     Time: 0034-9179 PT Time Calculation (min) (ACUTE ONLY): 23 min  Charges:  $Therapeutic Activity: 8-22 mins $Canalith Rep Proc: 8-22 mins                     Kathryn Wall M,PT Acute Rehab Services [phone]    Kathryn Wall 03/25/2022, 2:16 PM

## 2022-03-25 NOTE — Plan of Care (Signed)
  Problem: Education: Goal: Ability to describe self-care measures that may prevent or decrease complications (Diabetes Survival Skills Education) will improve Outcome: Progressing   Problem: Education: Goal: Knowledge of General Education information will improve Description: Including pain rating scale, medication(s)/side effects and non-pharmacologic comfort measures Outcome: Progressing   

## 2022-03-26 DIAGNOSIS — F0781 Postconcussional syndrome: Secondary | ICD-10-CM | POA: Diagnosis not present

## 2022-03-26 LAB — CBC
HCT: 32.1 % — ABNORMAL LOW (ref 36.0–46.0)
Hemoglobin: 10 g/dL — ABNORMAL LOW (ref 12.0–15.0)
MCH: 26.5 pg (ref 26.0–34.0)
MCHC: 31.2 g/dL (ref 30.0–36.0)
MCV: 84.9 fL (ref 80.0–100.0)
Platelets: 310 10*3/uL (ref 150–400)
RBC: 3.78 MIL/uL — ABNORMAL LOW (ref 3.87–5.11)
RDW: 17.2 % — ABNORMAL HIGH (ref 11.5–15.5)
WBC: 9.2 10*3/uL (ref 4.0–10.5)
nRBC: 0 % (ref 0.0–0.2)

## 2022-03-26 LAB — MAGNESIUM: Magnesium: 1.9 mg/dL (ref 1.7–2.4)

## 2022-03-26 LAB — GLUCOSE, CAPILLARY
Glucose-Capillary: 126 mg/dL — ABNORMAL HIGH (ref 70–99)
Glucose-Capillary: 123 mg/dL — ABNORMAL HIGH (ref 70–99)
Glucose-Capillary: 140 mg/dL — ABNORMAL HIGH (ref 70–99)
Glucose-Capillary: 134 mg/dL — ABNORMAL HIGH (ref 70–99)

## 2022-03-26 LAB — BASIC METABOLIC PANEL
Anion gap: 7 (ref 5–15)
BUN: 12 mg/dL (ref 8–23)
CO2: 29 mmol/L (ref 22–32)
Calcium: 9.1 mg/dL (ref 8.9–10.3)
Chloride: 103 mmol/L (ref 98–111)
Creatinine, Ser: 1.14 mg/dL — ABNORMAL HIGH (ref 0.44–1.00)
GFR, Estimated: 49 mL/min — ABNORMAL LOW (ref >60)
Glucose, Bld: 124 mg/dL — ABNORMAL HIGH (ref 70–99)
Potassium: 3.9 mmol/L (ref 3.5–5.1)
Sodium: 139 mmol/L (ref 135–145)

## 2022-03-26 MED ORDER — TRAMADOL HCL 50 MG PO TABS
50.0000 mg | ORAL_TABLET | Freq: Four times a day (QID) | ORAL | Status: DC | PRN
  Administered 2022-03-26: 50 mg via ORAL
  Filled 2022-03-26: qty 1

## 2022-03-26 NOTE — Care Management Important Message (Signed)
Important Message  Patient Details  Name: Kathryn Wall MRN: [id number] Date of Birth: 1945-12-21   Medicare Important Message Given:  Yes     Hannah Beat 03/26/2022, 3:44 PM

## 2022-03-26 NOTE — TOC Initial Note (Signed)
Transition of Care [facility]) - Initial/Assessment Note    Patient Details  Name: Kathryn Wall MRN: [id number] Date of Birth: 03/14/1946  Transition of [facility] CM/SW Contact:    Tresa Endo Phone Number: 03/26/2022, 4:14 PM  Clinical Narrative:                 CSW spoke with pt about PT recommendations for Snf. Pt stated the no longer wants to go to a rehab facility, she does not  think she needs it but is agreeable to [facility]. Pt has had HH in the past but does not remember what agency. Pt has all DME from a past injury.  CSW will sign off unless any further needs arise.         Patient Goals and CMS Choice        Expected Discharge Plan and Services                                                Prior Living Arrangements/Services                       Activities of Daily Living Home Assistive Devices/Equipment: Cane (specify quad or straight), Eyeglasses, Walker (specify type), CBG Meter ADL Screening (condition at time of admission) Patient's cognitive ability adequate to safely complete daily activities?: Yes Is the patient deaf or have difficulty hearing?: No Does the patient have difficulty seeing, even when wearing glasses/contacts?: No Does the patient have difficulty concentrating, remembering, or making decisions?: No Patient able to express need for assistance with ADLs?: Yes Does the patient have difficulty dressing or bathing?: No Independently performs ADLs?: Yes (appropriate for developmental age) Does the patient have difficulty walking or climbing stairs?: Yes Weakness of Legs: Both Weakness of Arms/Hands: Both  Permission Sought/Granted                  Emotional Assessment              Admission diagnosis:  Dizziness and giddiness [R42] Post concussion syndrome [F07.81] Injury of head, initial encounter [S09.90XA] Patient Active Problem List   Diagnosis Date Noted   Post concussion syndrome 03/23/2022    Open wound of foot 01/09/2022   Generalized weakness 12/31/2020   Fall at home, initial encounter 12/31/2020   Pneumonia of right lower lobe due to infectious organism 12/31/2020   Mixed diabetic hyperlipidemia associated with type 2 diabetes mellitus ([place]) 12/31/2020   GERD without esophagitis 12/31/2020   SIRS (systemic inflammatory response syndrome) ([place]) 12/31/2020   Acute diverticulitis 12/31/2020   Weakness 12/31/2020   Osteopenia of femoral neck, bilateral 10/15/2016   Breast cancer of lower-outer quadrant of right female breast ([place]) 04/02/2014   Chronic kidney disease, stage 3a ([place])    Essential hypertension, benign 08/14/2012   Hypercholesteremia 08/14/2012   Type 2 diabetes mellitus with stage 3a chronic kidney disease, without long-term current use of insulin ([place]) 08/14/2012   Pyelonephritis 05/29/2011   Constipation 05/29/2011   AKI (acute kidney injury) ([place]) 05/29/2011   PCP:  [name], MD Pharmacy:   [facility], [place] [address] [place] [address] Phone: [phone] Fax: [phone]     Social Determinants of Health (SDOH) Interventions    Readmission Risk Interventions     No data to  display

## 2022-03-26 NOTE — Plan of Care (Signed)
  Problem: Education: Goal: Ability to describe self-care measures that may prevent or decrease complications (Diabetes Survival Skills Education) will improve Outcome: Progressing   Problem: Education: Goal: Knowledge of General Education information will improve Description: Including pain rating scale, medication(s)/side effects and non-pharmacologic comfort measures Outcome: Progressing   

## 2022-03-26 NOTE — Progress Notes (Signed)
Patient stated that she sleeps until 1-2 pm every day and preferred not to be woken up early for pills. Holding daily meds until she wakes up per her request

## 2022-03-26 NOTE — Progress Notes (Signed)
Kathryn Wall  KXF:[id number] DOB: Apr 12, 1946 DOA: 03/23/2022 PCP: [name], MD    Brief Narrative:  76 year old with a history of HTN, HLD, breast cancer status postlumpectomy and XRT, DM2, and PAF on Eliquis who fell 1 day prior striking the back of her head.  She did not lose consciousness at that time, and EMS arrived at her home, but she declined transport.  Since that time she has developed constant persisting dizziness with intermittent vomiting.  While in the ED she was unable to stand without severe dizziness and citing nausea with vomiting.  Meclizine did not prove to be helpful.   Consultants:  None  Goals of Care:  Code Status: Full Code   DVT prophylaxis: Eliquis  Interim Hx: Resting comfortably in a bedside chair.  Reports of vertigo continues to improve though is still present.  Is not interested in pursuing a rehab stay and wishes to go home.  Reports some nausea and minimal dizziness when moving her head.  Overall does feel that she is making progress.  Assessment & Plan:  Concussion -postconcussion syndrome -dizziness with vomiting MRA brain negative for acute findings -CT head and cervical spine without acute findings -patient did appear to be dehydrated at time of presentation - supported with IV fluid resuscitation during this hospital stay - PT/OT provided ongoing care and vestibular rehabilitation during her stay -recommendation was for the patient to go into a SNF for a short-term rehab stay but she declined and desired discharge home instead  Paroxysmal atrial fibrillation on Eliquis Continue Toprol and Eliquis -rate controlled throughout the hospital stay  Acute kidney injury Creatinine 0.82 at presentation and during hospital stay trended up to a peak of 1.46 -likely simply due to poor intake and dehydration related to recurrent vomiting -creatinine improved with simple volume resuscitation -patient encouraged to maintain adequate oral intake at  home  Mild hyponatremia Appears to have been hypovolemic in etiology -resolved with volume resuscitation  B12 deficiency Vitamin B12 206 -initiated replacement and follow-up in 8-12 weeks as outpatient  Hypocalcemia Corrected with supplementation -continue daily supplementation  Iron deficiency anemia Ferritin 11, iron 27, and TIBC of 349 -patient confirms she has a colonoscopy every 5 years, though she admits she is now due -encouraged her to schedule this soon in the outpatient setting -loaded with IV iron during this admission  Hypomagnesemia Likely due to GI losses -corrected with supplementation  DM2 A1c 5.04 December 2021 - A1c 6.0 this admission -CBG controlled throughout this admission  HTN Reasonably well controlled -follow for now without change in regimen  HLD Continue usual home medication  Chronic anxiety/depression Continue usual home medication  Family Communication: No family present at time of exam Disposition: Anticipate discharge home, likely 9/29 depending on how she performs with therapy   Objective: Blood pressure (!) 142/81, pulse 69, temperature 98.7 F (37.1 C), temperature source Oral, resp. rate 16, height '5\' 5"'$  (1.651 m), weight 75.4 kg, SpO2 94 %.  Intake/Output Summary (Last 24 hours) at 03/26/2022 0943 Last data filed at 03/26/2022 0510 Gross per 24 hour  Intake 2630.91 ml  Output 2450 ml  Net 180.91 ml    Filed Weights   03/23/22 1810 03/24/22 2146  Weight: 68.9 kg 75.4 kg    Examination: General: No acute respiratory distress Lungs: Clear to auscultation bilaterally Cardiovascular: Regular rate and rhythm without murmur  Abdomen: Nontender, nondistended, soft, bowel sounds positive, no rebound Extremities: No significant edema BLE  CBC: Recent Labs  Lab  03/23/22 1844 03/24/22 0411 03/25/22 0302 03/26/22 0213  WBC 10.0 10.0 8.2 9.2  NEUTROABS 8.6*  --   --   --   HGB 12.5 10.7* 10.2* 10.0*  HCT 42.1 34.3* 32.1* 32.1*  MCV  88.1 84.5 84.3 84.9  PLT 289 325 275 174    Basic Metabolic Panel: Recent Labs  Lab 03/24/22 0411 03/25/22 0302 03/26/22 0213  NA 138 134* 139  K 4.0 4.1 3.9  CL 105 101 103  CO2 21* 24 29  GLUCOSE 122* 142* 124*  BUN '12 19 12  '$ CREATININE 1.04* 1.46* 1.14*  CALCIUM 9.2 8.4* 9.1  MG 1.5* 2.0 1.9  PHOS 4.0  --   --     GFR: Estimated Creatinine Clearance: 41.3 mL/min (A) (by C-G formula based on SCr of 1.14 mg/dL (H)).   Scheduled Meds:  amLODipine  5 mg Oral Daily   apixaban  5 mg Oral BID   atorvastatin  40 mg Oral Daily   calcium carbonate  1 tablet Oral BID WC   cyanocobalamin  1,000 mcg Subcutaneous Daily   influenza vaccine adjuvanted  0.5 mL Intramuscular Tomorrow-1000   insulin aspart  0-5 Units Subcutaneous QHS   insulin aspart  0-9 Units Subcutaneous TID WC   metoprolol succinate  12.5 mg Oral Daily   pantoprazole  40 mg Oral QHS   PARoxetine  20 mg Oral Daily   QUEtiapine  50 mg Oral QHS     LOS: 3 days   Cherene Altes, MD [facility] Office  [phone] Pager - Text Page per Shea Evans  If 7PM-7AM, please contact night-coverage per Amion 03/26/2022, 9:43 AM

## 2022-03-26 NOTE — NC FL2 (Signed)
[place] MEDICAID FL2 LEVEL OF CARE SCREENING TOOL     IDENTIFICATION  Patient Name: Kathryn Wall Birthdate: [birth year] Sex: female Admission Date (Current Location): 03/23/2022  [facility] and [place] Number:  Herbalist and Address:  [facility] [place] [place], [place] [address], [place], [place] [address]      Provider Number: [id number]  Attending Physician Name and Address:  Cherene Altes, MD  Relative Name and Phone Number:  Lincoln Brigham (Daughter)   [phone]    Current Level of Care: Hospital Recommended Level of Care: [place] Prior Approval Number:    Date Approved/Denied:   PASRR Number: [id number]  Discharge Plan: SNF    Current Diagnoses: Patient Active Problem List   Diagnosis Date Noted   Post concussion syndrome 03/23/2022   Open wound of foot 01/09/2022   Generalized weakness 12/31/2020   Fall at home, initial encounter 12/31/2020   Pneumonia of right lower lobe due to infectious organism 12/31/2020   Mixed diabetic hyperlipidemia associated with type 2 diabetes mellitus ([place]) 12/31/2020   GERD without esophagitis 12/31/2020   SIRS (systemic inflammatory response syndrome) ([place]) 12/31/2020   Acute diverticulitis 12/31/2020   Weakness 12/31/2020   Osteopenia of femoral neck, bilateral 10/15/2016   Breast cancer of lower-outer quadrant of right female breast ([place]) 04/02/2014   Chronic kidney disease, stage 3a ([place])    Essential hypertension, benign 08/14/2012   Hypercholesteremia 08/14/2012   Type 2 diabetes mellitus with stage 3a chronic kidney disease, without long-term current use of insulin ([place]) 08/14/2012   Pyelonephritis 05/29/2011   Constipation 05/29/2011   AKI (acute kidney injury) ([place]) 05/29/2011    Orientation RESPIRATION BLADDER Height & Weight     Self, Time, Place, Situation  Normal Continent, External catheter Weight: 166 lb 3.6 oz (75.4 kg) Height:  '5\' 5"'$  (165.1 cm)  BEHAVIORAL  SYMPTOMS/MOOD NEUROLOGICAL BOWEL NUTRITION STATUS      Continent Diet (See DC  Summary)  AMBULATORY STATUS COMMUNICATION OF NEEDS Skin   Limited Assist Verbally Normal                       Personal Care Assistance Level of Assistance  Bathing, Feeding, Dressing Bathing Assistance: Limited assistance Feeding assistance: Independent Dressing Assistance: Limited assistance     Functional Limitations Info  Sight, Hearing, Speech Sight Info: Adequate Hearing Info: Adequate Speech Info: Adequate    SPECIAL CARE FACTORS FREQUENCY  PT (By licensed PT), OT (By licensed OT)     PT Frequency: 5x a week OT Frequency: 5x a week            Contractures Contractures Info: Not present    Additional Factors Info  Code Status, Allergies Code Status Info: Full Allergies Info: Ambien (Zolpidem Tartrate)   Cymbalta (Duloxetine Hcl)   Other   Ciprofloxacin   Doxazosin   Percocet (Oxycodone-acetaminophen)           Current Medications (03/26/2022):  This is the current hospital active medication list Current Facility-Administered Medications  Medication Dose Route Frequency Provider Last Rate Last Admin   acetaminophen (TYLENOL) tablet 650 mg  650 mg Oral Q6H PRN Cyndee, Giammarco N, DO   650 mg at 03/25/22 0047   amLODipine (NORVASC) tablet 5 mg  5 mg Oral Daily Dyana, Magner N, DO   5 mg at 03/26/22 1134   apixaban (ELIQUIS) tablet 5 mg  5 mg Oral BID Irene Pap N, DO   5 mg at 03/26/22  1135   atorvastatin (LIPITOR) tablet 40 mg  40 mg Oral Daily Huong, Luthi N, DO   40 mg at 03/26/22 1135   calcium carbonate (OS-CAL - dosed in mg of elemental calcium) tablet 1,250 mg  1 tablet Oral BID WC Cherene Altes, MD   1,250 mg at 03/26/22 1135   cyanocobalamin (VITAMIN B12) injection 1,000 mcg  1,000 mcg Subcutaneous Daily Cherene Altes, MD   1,000 mcg at 03/26/22 1134   influenza vaccine adjuvanted (FLUAD) injection 0.5 mL  0.5 mL Intramuscular Tomorrow-1000 Cherene Altes, MD        insulin aspart (novoLOG) injection 0-5 Units  0-5 Units Subcutaneous QHS Hall, Fritzi N, DO       insulin aspart (novoLOG) injection 0-9 Units  0-9 Units Subcutaneous TID WC Hall, Katie N, DO       melatonin tablet 5 mg  5 mg Oral QHS PRN Irene Pap N, DO   5 mg at 03/25/22 2212   metoprolol succinate (TOPROL-XL) 24 hr tablet 12.5 mg  12.5 mg Oral Daily Audreanna, Torrisi N, DO   12.5 mg at 03/26/22 1134   ondansetron (ZOFRAN) injection 4 mg  4 mg Intravenous Q6H PRN Irene Pap N, DO   4 mg at 03/26/22 1142   pantoprazole (PROTONIX) EC tablet 40 mg  40 mg Oral QHS Hall, Haeleigh N, DO   40 mg at 03/25/22 2207   PARoxetine (PAXIL) tablet 20 mg  20 mg Oral Daily Jadah, Bobak N, DO   20 mg at 03/26/22 1134   polyethylene glycol (MIRALAX / GLYCOLAX) packet 17 g  17 g Oral Daily PRN Irene Pap N, DO       QUEtiapine (SEROQUEL) tablet 50 mg  50 mg Oral QHS Hall, Rosanna N, DO   50 mg at 03/25/22 2207     Discharge Medications: Please see discharge summary for a list of discharge medications.  Relevant Imaging Results:  Relevant Lab Results:   Additional Information SSN: [id number]. [place] COVID-19 Vaccine 08/06/2020, 08/08/2019 , 07/18/2019  Reece Agar, LCSWA

## 2022-03-26 NOTE — Progress Notes (Signed)
Occupational Therapy Treatment Patient Details Name: Kathryn Wall MRN: [id number] DOB: Jul 26, 1945 Today's Date: 03/26/2022   History of present illness Pt is a 76 y/o female admitted due to dizziness and fall at home where she hit her head. MRI negative for acute abnormalities though concussion suspected. PMH: HTN, HLD, breast cancer w/ lumpectomy and radiation, DM2, a fib   OT comments  Pt in bed upon therapy arrival and agreeable to participate in OT tx session. Pt's call light indicating need for assist and reports that the Pure wick did not suction and her bed is wet. Assisted patient with LB bathing and UB dressing with patient demonstrating improvement with sitting and standing balance while completing LB bathing standing. Pt transferred to recliner from bed with RW and SBA. No LOB noted. Provided pt with supplies to brush her teeth later. Bedding was changed. All needs met at end of session. Pt will call nursing if Pure wick continues to malfunction. OT will continue to follow acutely.   Recommendations for follow up therapy are one component of a multi-disciplinary discharge planning process, led by the attending physician.  Recommendations may be updated based on patient status, additional functional criteria and insurance authorization.    Follow Up Recommendations  Skilled nursing-short term rehab (<3 hours/day)    Assistance Recommended at Discharge Intermittent Supervision/Assistance  Patient can return home with the following  A little help with walking and/or transfers;A little help with bathing/dressing/bathroom;Assist for transportation;Assistance with cooking/housework;Help with stairs or ramp for entrance   Equipment Recommendations  None recommended by OT       Precautions / Restrictions Precautions Precautions: Fall Restrictions Weight Bearing Restrictions: No       Mobility Bed Mobility Overal bed mobility: Needs Assistance Bed Mobility: Supine to Sit      Supine to sit: Min assist, HOB elevated     General bed mobility comments: Required some assistance to bring upper body up completely when transitioning from supine to sit. Pt able to complete 75% of task. Patient Response: Cooperative  Transfers Overall transfer level: Needs assistance Equipment used: Rollator (4 wheels) Transfers: Sit to/from Stand, Bed to chair/wheelchair/BSC Sit to Stand: Supervision     Step pivot transfers: Supervision           Balance   Sitting-balance support: No upper extremity supported, Feet supported Sitting balance-Leahy Scale: Good     Standing balance support: Single extremity supported, During functional activity, Reliant on assistive device for balance Standing balance-Leahy Scale: Poor Standing balance comment: relied on at least 1 UE support on RW for balance when standing to complete peri care         ADL either performed or assessed with clinical judgement   ADL Overall ADL's : Needs assistance/impaired Eating/Feeding: Independent;Sitting   Grooming: Brushing hair;Set up;Sitting       Lower Body Bathing: Min guard;Sit to/from stand   Upper Body Dressing : Set up;Sitting     Functional mobility during ADLs: Supervision/safety;Rolling walker (2 wheels) General ADL Comments: Pt reports that dizziness is still present although decreased from last session.      Cognition Arousal/Alertness: Awake/alert Behavior During Therapy: WFL for tasks assessed/performed Overall Cognitive Status: Within Functional Limits for tasks assessed                       Pertinent Vitals/ Pain       Pain Assessment Pain Assessment: No/denies pain Pain Score: 0-No pain  Frequency  Min 2X/week        Progress Toward Goals  OT Goals(current goals can now be found in the care plan section)  Progress towards OT goals: Progressing toward goals     Plan Discharge plan remains appropriate;Frequency remains appropriate        AM-PAC OT "6 Clicks" Daily Activity     Outcome Measure   Help from another person eating meals?: None Help from another person taking care of personal grooming?: A Little Help from another person toileting, which includes using toliet, bedpan, or urinal?: A Little Help from another person bathing (including washing, rinsing, drying)?: A Little Help from another person to put on and taking off regular upper body clothing?: A Little Help from another person to put on and taking off regular lower body clothing?: A Little 6 Click Score: 19    End of Session Equipment Utilized During Treatment: Rolling walker (2 wheels)  OT Visit Diagnosis: Unsteadiness on feet (R26.81);Other abnormalities of gait and mobility (R26.89);History of falling (Z91.81);Dizziness and giddiness (R42)   Activity Tolerance Patient tolerated treatment well   Patient Left in chair;with call bell/phone within reach;with chair alarm set           Time: 0601-5615 OT Time Calculation (min): 27 min  Charges: OT General Charges $OT Visit: 1 Visit OT Treatments $Self Care/Home Management : 23-37 mins  Ailene Ravel, OTR/L,CBIS  Supplemental OT - [facility] and [facility]   Kailash Hinze, Clarene Duke 03/26/2022, 2:43 PM

## 2022-03-26 NOTE — Progress Notes (Signed)
Physical Therapy Treatment Patient Details Name: Kathryn Wall MRN: [id number] DOB: [birth year] Today's Date: 03/26/2022   History of Present Illness Pt is a 76 y/o female admitted due to dizziness and fall at home where she hit her head. MRI negative for acute abnormalities though concussion suspected. PMH: HTN, HLD, breast cancer w/ lumpectomy and radiation, DM2, a fib    PT Comments    Pt admitted with above diagnosis. Pt was able to ambulate around bed with min guard assist with RW with better stability today as pt's vertigo is better.  Pt still needs continued vestibular rehab but is making some progress.  Pt currently with functional limitations due to balance and endurance deficits. Pt will benefit from skilled PT to increase their independence and safety with mobility to allow discharge to the venue listed below.      Recommendations for follow up therapy are one component of a multi-disciplinary discharge planning process, led by the attending physician.  Recommendations may be updated based on patient status, additional functional criteria and insurance authorization.  Follow Up Recommendations  Skilled nursing-short term rehab (<3 hours/day) (for vestibular rehab) Can patient physically be transported by private vehicle: Yes   Assistance Recommended at Discharge Frequent or constant Supervision/Assistance  Patient can return home with the following A little help with walking and/or transfers;A little help with bathing/dressing/bathroom;Assistance with cooking/housework;Help with stairs or ramp for entrance;Assist for transportation   Equipment Recommendations  Rolling walker (2 wheels)    Recommendations for Other Services       Precautions / Restrictions Precautions Precautions: Fall Restrictions Weight Bearing Restrictions: No     Mobility  Bed Mobility Overal bed mobility: Needs Assistance Bed Mobility: Supine to Sit, Sit to Supine     Supine to sit: Min assist,  HOB elevated     General bed mobility comments: Pt states her dizziness is better than yesterday.  Re tested for right BPPV and repeated Epley maneuver for right posterior canal BPPV to ensure all crystals moved.   Pt needed light assist to bring LEs to EOB and lift trunk.    Transfers Overall transfer level: Needs assistance Equipment used: Rolling walker (2 wheels) Transfers: Sit to/from Stand Sit to Stand: Min assist, Min guard           General transfer comment: steadying assist to stand. Pt reports dizziness improved from  last session    Ambulation/Gait Ambulation/Gait assistance: Min guard Gait Distance (Feet): 50 Feet Assistive device: Rolling walker (2 wheels) Gait Pattern/deviations: Step-through pattern, Decreased stride length Gait velocity: Decreased Gait velocity interpretation: <1.31 ft/sec, indicative of household ambulator   General Gait Details: Min guard A for steadying with pt using RW for support. Pt reports dizziness fairly resolved after treatment today.   Stairs             Wheelchair Mobility    Modified Rankin (Stroke Patients Only)       Balance Overall balance assessment: Needs assistance, History of Falls Sitting-balance support: No upper extremity supported, Feet supported, Bilateral upper extremity supported Sitting balance-Leahy Scale: Poor Sitting balance - Comments: relied on UE support on EOB due to dizziness   Standing balance support: Single extremity supported, During functional activity, Bilateral upper extremity supported, No upper extremity supported Standing balance-Leahy Scale: Poor Standing balance comment: relies on at least 1 UE support for balance.  Cognition Arousal/Alertness: Awake/alert Behavior During Therapy: WFL for tasks assessed/performed Overall Cognitive Status: No family/caregiver present to determine baseline cognitive functioning                                  General Comments: some decreased insight into deficits, questionable memory deficits. functional, pleasant and follows directions consistently        Exercises      General Comments        Pertinent Vitals/Pain Pain Assessment Pain Assessment: No/denies pain    Home Living                          Prior Function            PT Goals (current goals can now be found in the care plan section) Acute Rehab PT Goals Patient Stated Goal: to feel better Progress towards PT goals: Progressing toward goals    Frequency    Min 3X/week      PT Plan Current plan remains appropriate    Co-evaluation              AM-PAC PT "6 Clicks" Mobility   Outcome Measure  Help needed turning from your back to your side while in a flat bed without using bedrails?: A Little Help needed moving from lying on your back to sitting on the side of a flat bed without using bedrails?: A Little Help needed moving to and from a bed to a chair (including a wheelchair)?: A Little Help needed standing up from a chair using your arms (e.g., wheelchair or bedside chair)?: A Little Help needed to walk in hospital room?: A Little Help needed climbing 3-5 steps with a railing? : A Lot 6 Click Score: 17    End of Session Equipment Utilized During Treatment: Gait belt Activity Tolerance: Patient limited by fatigue Patient left: with call bell/phone within reach;in chair;with chair alarm set Nurse Communication: Mobility status PT Visit Diagnosis: Unsteadiness on feet (R26.81);Muscle weakness (generalized) (M62.81);History of falling (Z91.81)     Time: 6861-6837 PT Time Calculation (min) (ACUTE ONLY): 27 min  Charges:  $Gait Training: 8-22 mins $Canalith Rep Proc: 8-22 mins                     [name] M,PT Acute Rehab Services [phone]    [name] 03/26/2022, 12:59 PM

## 2022-03-26 NOTE — TOC Initial Note (Addendum)
Transition of Care [facility]) - Initial/Assessment Note    Patient Details  Name: Kathryn Wall MRN: [id number] Date of Birth: [birth year]  Transition of Care [facility] CM/SW Contact:    Marilu Favre, RN Phone Number: 03/26/2022, 4:27 PM  Clinical Narrative:                 Discussed PT recommendations with patient for SNF. Patient declining wanting to go home at discharge.   Patient lives alone however daughter lives next door, another daughter lives 6 miles away ( works full time) . She has a girlfriend who might stay with her. Patient states daughter knows her plan is to go home at discharge.   Patient already has rollator, cane, wheel chair, walker , shower chair and bedside commode at home.   She has had home health 4 months ago but does not remember the name of the agency .   She has no preference in agency.   Claiborne Billings with [facility] accepted referral   Expected Discharge Plan: [place]     Patient Goals and CMS Choice Patient states their goals for this hospitalization and ongoing recovery are:: to return to home CMS Medicare.gov Compare Post Acute Care list provided to:: Patient Choice offered to / list presented to : Patient  Expected Discharge Plan and Services Expected Discharge Plan: [place]   Discharge Planning Services: CM Consult Post Acute Care Choice: [place] arrangements for the past 2 months: Single Family Home                   DME Agency: NA       HH Arranged: PT          Prior Living Arrangements/Services Living arrangements for the past 2 months: Single Family Home Lives with:: Self Patient language and need for interpreter reviewed:: Yes Do you feel safe going back to the place where you live?: Yes      Need for Family Participation in Patient Care: Yes (Comment) Care giver support system in place?: Yes (comment) Current home services: DME Criminal Activity/Legal Involvement Pertinent to  Current Situation/Hospitalization: No - Comment as needed  Activities of Daily Living Home Assistive Devices/Equipment: Cane (specify quad or straight), Eyeglasses, Walker (specify type), CBG Meter ADL Screening (condition at time of admission) Patient's cognitive ability adequate to safely complete daily activities?: Yes Is the patient deaf or have difficulty hearing?: No Does the patient have difficulty seeing, even when wearing glasses/contacts?: No Does the patient have difficulty concentrating, remembering, or making decisions?: No Patient able to express need for assistance with ADLs?: Yes Does the patient have difficulty dressing or bathing?: No Independently performs ADLs?: Yes (appropriate for developmental age) Does the patient have difficulty walking or climbing stairs?: Yes Weakness of Legs: Both Weakness of Arms/Hands: Both  Permission Sought/Granted   Permission granted to share information with : No              Emotional Assessment Appearance:: Appears stated age Attitude/Demeanor/Rapport: Engaged Affect (typically observed): Accepting Orientation: : Oriented to Self, Oriented to Place, Oriented to  Time, Oriented to Situation Alcohol / Substance Use: Not Applicable Psych Involvement: No (comment)  Admission diagnosis:  Dizziness and giddiness [R42] Post concussion syndrome [F07.81] Injury of head, initial encounter [S09.90XA] Patient Active Problem List   Diagnosis Date Noted   Post concussion syndrome 03/23/2022   Open wound of foot 01/09/2022   Generalized weakness 12/31/2020   Fall at  home, initial encounter 12/31/2020   Pneumonia of right lower lobe due to infectious organism 12/31/2020   Mixed diabetic hyperlipidemia associated with type 2 diabetes mellitus ([place]) 12/31/2020   GERD without esophagitis 12/31/2020   SIRS (systemic inflammatory response syndrome) ([place]) 12/31/2020   Acute diverticulitis 12/31/2020   Weakness 12/31/2020   Osteopenia of  femoral neck, bilateral 10/15/2016   Breast cancer of lower-outer quadrant of right female breast ([place]) 04/02/2014   Chronic kidney disease, stage 3a ([place])    Essential hypertension, benign 08/14/2012   Hypercholesteremia 08/14/2012   Type 2 diabetes mellitus with stage 3a chronic kidney disease, without long-term current use of insulin ([place]) 08/14/2012   Pyelonephritis 05/29/2011   Constipation 05/29/2011   AKI (acute kidney injury) ([place]) 05/29/2011   PCP:  [name], MD Pharmacy:   [facility], [place] [place] [address] Phone: [phone] Fax: [phone]     Social Determinants of Health (SDOH) Interventions    Readmission Risk Interventions     No data to display

## 2022-03-27 DIAGNOSIS — F0781 Postconcussional syndrome: Secondary | ICD-10-CM | POA: Diagnosis not present

## 2022-03-27 LAB — GLUCOSE, CAPILLARY
Glucose-Capillary: 98 mg/dL (ref 70–99)
Glucose-Capillary: 151 mg/dL — ABNORMAL HIGH (ref 70–99)

## 2022-03-27 LAB — BASIC METABOLIC PANEL
Anion gap: 13 (ref 5–15)
BUN: 11 mg/dL (ref 8–23)
CO2: 23 mmol/L (ref 22–32)
Calcium: 9.3 mg/dL (ref 8.9–10.3)
Chloride: 102 mmol/L (ref 98–111)
Creatinine, Ser: 0.95 mg/dL (ref 0.44–1.00)
GFR, Estimated: >60 mL/min (ref >60)
Glucose, Bld: 107 mg/dL — ABNORMAL HIGH (ref 70–99)
Potassium: 4.3 mmol/L (ref 3.5–5.1)
Sodium: 138 mmol/L (ref 135–145)

## 2022-03-27 MED ORDER — VITAMIN B-12 1000 MCG PO TABS: 1000.0000 ug | ORAL_TABLET | Freq: Every day | ORAL | Status: DC

## 2022-03-27 MED ORDER — CALCIUM CARBONATE 1250 (500 CA) MG PO TABS: 1.0000 | ORAL_TABLET | Freq: Two times a day (BID) | ORAL | Status: DC

## 2022-03-27 NOTE — Progress Notes (Signed)
Patient discharged to home in stable condition. Discharge instruction and packet given.

## 2022-03-27 NOTE — Discharge Summary (Signed)
[place]  MR#: [id number]  DOB:[birth year]  Date of Admission: 03/23/2022 Date of Discharge: 03/27/2022  Attending Physician:Jacie Tristan Hennie Duos, MD  Patient's EXB:[id number], Cammie Mcgee, MD  Consults: None  Disposition: Discharge home per patient preference -SNF rehab stay recommended by medical team  Follow-up Appts:  Follow-up Information     [facility].   Specialty: [facility] Contact information: [place] [address] [phone]         [name], MD Follow up in 1 week(s).   Specialty: Family Medicine Contact information: [address] [place] [address] [phone]                 Tests Needing Follow-up: -Recheck B12 level in 8-12 weeks to determine if oral replacement is sufficient -Recheck calcium as outpatient -Encouraged patient to schedule screening colonoscopy for evaluation of iron deficiency anemia  Discharge Diagnoses: Concussion -postconcussion syndrome -dizziness with vomiting Paroxysmal atrial fibrillation on Eliquis Acute kidney injury Mild hyponatremia B12 deficiency Hypocalcemia Iron deficiency anemia Hypomagnesemia DM2 HTN HLD Chronic anxiety/depression  Initial presentation: 76 year old with a history of HTN, HLD, breast cancer status postlumpectomy and XRT, DM2, and PAF on Eliquis who fell 1 day prior striking the back of her head.  She did not lose consciousness at that time, and EMS arrived at her home, but she declined transport.  Since that time she has developed constant persisting dizziness with intermittent vomiting.  While in the ED she was unable to stand without severe dizziness and citing nausea with vomiting.  Meclizine did not prove to be helpful.  Hospital Course:  Concussion - postconcussion syndrome - dizziness with vomiting MRA brain negative for acute findings -CT head and cervical spine without acute  findings - patient did appear to be dehydrated at time of presentation - supported with IV fluid resuscitation during this hospital stay - PT/OT provided ongoing care and vestibular rehabilitation during her stay -recommendation was for the patient to go into a SNF for a short-term rehab stay but she declined and desired discharge home instead -Home health assistance arranged at patient's request at time of discharge and that she was resistant to our suggestion for the most safe discharge being to SNF for rehab   Paroxysmal atrial fibrillation on Eliquis Continue Toprol and Eliquis -rate controlled throughout the hospital stay   Acute kidney injury Creatinine 0.82 at presentation and during hospital stay trended up to a peak of 1.46 - likely simply due to poor intake and dehydration related to recurrent vomiting - creatinine improved with simple volume resuscitation - patient encouraged to maintain adequate oral intake at home   Mild hyponatremia Appears to have been hypovolemic in etiology - resolved with volume resuscitation   B12 deficiency Vitamin B12 206 -initiated replacement and follow-up in 8-12 weeks as outpatient   Hypocalcemia Corrected with supplementation - continue daily supplementation   Iron deficiency anemia Ferritin 11, iron 27, and TIBC of 349 - patient confirms she has a colonoscopy every 5 years, though she admits she is now due -encouraged her to schedule this soon in the outpatient setting - loaded with IV iron during this admission   Hypomagnesemia Likely due to GI losses -corrected with supplementation   DM2 A1c 5.04 December 2021 - A1c 6.0 this admission - CBG controlled throughout this admission   HTN Reasonably well controlled during this admission   HLD Continue usual home medication  Chronic anxiety/depression Continue usual home medication    Allergies as of 03/27/2022       Reactions   Ambien [zolpidem Tartrate] Other (See Comments)   Can not  tolerate, causes sleepwalking   Cymbalta [duloxetine Hcl] Other (See Comments)   Unknown Reaction   Other    SENSITIVE TO ANTIBIOTICS   Ciprofloxacin Nausea Only   Nausea   Doxazosin Nausea And Vomiting   Percocet [oxycodone-acetaminophen] Nausea And Vomiting        Medication List     TAKE these medications    acetaminophen 325 MG tablet Commonly known as: TYLENOL Take 2 tablets (650 mg total) by mouth every 6 (six) hours as needed for mild pain (or Fever >/= 101).   amLODipine 5 MG tablet Commonly known as: NORVASC TAKE 1 TABLET BY MOUTH ONCE A DAY   atorvastatin 40 MG tablet Commonly known as: LIPITOR TAKE 1 TABLET BY MOUTH ONCE A DAY   calcium carbonate 1250 (500 Ca) MG tablet Commonly known as: OS-CAL - dosed in mg of elemental calcium Take 1 tablet (1,250 mg total) by mouth 2 (two) times daily with a meal.   cyanocobalamin 1000 MCG tablet Commonly known as: VITAMIN B12 Take 1 tablet (1,000 mcg total) by mouth daily.   Eliquis 5 MG Tabs tablet Generic drug: apixaban TAKE 1 TABLET BY MOUTH TWICE A DAY What changed: how much to take   Jardiance 25 MG Tabs tablet Generic drug: empagliflozin TAKE 1 TABLET BY MOUTH ONCE A DAY What changed:  how much to take when to take this   losartan 50 MG tablet Commonly known as: COZAAR TAKE 1 TABLET BY MOUTH ONCE A DAY What changed: when to take this   metFORMIN 500 MG 24 hr tablet Commonly known as: GLUCOPHAGE-XR TAKE 4 TABLETS BY MOUTH ONCE DAILY WITH BREAKFAST. What changed: See the new instructions.   metoprolol succinate 25 MG 24 hr tablet Commonly known as: TOPROL-XL TAKE 1 TABLET BY MOUTH ONCE A DAY (NEED OFFICE VISIT FOR FUTURE REFILLS) What changed: See the new instructions.   nystatin ointment Commonly known as: MYCOSTATIN APPLY TO AFFECTED AREAS TWICE DAILY What changed: See the new instructions.   pantoprazole 40 MG tablet Commonly known as: PROTONIX TAKE 1 TABLET BY MOUTH EVERY MORNING What  changed: when to take this   PARoxetine 20 MG tablet Commonly known as: PAXIL TAKE 1 TABLET BY MOUTH ONCE A DAY   QUEtiapine 50 MG tablet Commonly known as: SEROQUEL TAKE 1 TABLET BY MOUTH DAILY AT BEDTIME        Day of Discharge BP (!) 143/69 (BP Location: Left Arm)   Pulse 63   Temp 97.8 F (36.6 C) (Oral)   Resp 16   Ht '5\' 5"'$  (1.651 m)   Wt 75.4 kg   SpO2 95%   BMI 27.66 kg/m   Physical Exam: General: No acute respiratory distress Lungs: Clear to auscultation bilaterally without wheezes or crackles Cardiovascular: Regular rate and rhythm without murmur gallop or rub normal S1 and S2 Abdomen: Nontender, nondistended, soft, bowel sounds positive, no rebound, no ascites, no appreciable mass Extremities: No significant cyanosis, clubbing, or edema bilateral lower extremities  Basic Metabolic Panel: Recent Labs  Lab 03/23/22 1844 03/24/22 0411 03/25/22 0302 03/26/22 0213 03/27/22 0239  NA 140 138 134* 139 138  K 4.1 4.0 4.1 3.9 4.3  CL 115* 105 101 103 102  CO2 15* 21* '24 29 23  '$ GLUCOSE 135* 122* 142* 124* 107*  BUN 10  $'12 19 12 11  't$ CREATININE 0.82 1.04* 1.46* 1.14* 0.95  CALCIUM 7.8* 9.2 8.4* 9.1 9.3  MG  --  1.5* 2.0 1.9  --   PHOS  --  4.0  --   --   --    CBC: Recent Labs  Lab 03/23/22 1844 03/24/22 0411 03/25/22 0302 03/26/22 0213  WBC 10.0 10.0 8.2 9.2  NEUTROABS 8.6*  --   --   --   HGB 12.5 10.7* 10.2* 10.0*  HCT 42.1 34.3* 32.1* 32.1*  MCV 88.1 84.5 84.3 84.9  PLT 289 325 275 310    Time spent in discharge (includes decision making & examination of pt): 35 minutes  03/27/2022, 10:11 AM   Cherene Altes, MD [facility] Office  [phone]

## 2022-03-29 ENCOUNTER — Telehealth: Payer: Self-pay

## 2022-03-29 NOTE — Telephone Encounter (Signed)
Transition Care Management Unsuccessful Follow-up Telephone Call  Date of discharge and from where:  03/27/22[facility]  Attempts:  1st Attempt  Reason for unsuccessful TCM follow-up call:  Left voice message

## 2022-04-22 ENCOUNTER — Other Ambulatory Visit: Payer: Self-pay | Admitting: Family Medicine

## 2022-04-27 ENCOUNTER — Telehealth: Payer: Self-pay | Admitting: Student

## 2022-04-27 NOTE — Telephone Encounter (Signed)
Called pt to check up on home health services and see which home health she has gone with. Left vmail

## 2022-05-18 ENCOUNTER — Other Ambulatory Visit: Payer: Self-pay | Admitting: Family Medicine

## 2022-05-18 NOTE — Telephone Encounter (Signed)
Requested Prescriptions  Pending Prescriptions Disp Refills   QUEtiapine (SEROQUEL) 50 MG tablet [Pharmacy Med Name: QUETIAPINE FUMARATE 50 MG TAB] 30 tablet 1    Sig: TAKE 1 TABLET BY MOUTH DAILY AT BEDTIME     Not Delegated - Psychiatry:  Antipsychotics - Second Generation (Atypical) - quetiapine Failed - 05/18/2022  4:45 PM      Failed - This refill cannot be delegated      Failed - Valid encounter within last 6 months    Recent Outpatient Visits           1 year ago Type 2 diabetes mellitus with hyperlipidemia ([place])   [place] Pickard, Kathryn Mcgee, MD   1 year ago Bibasilar crackles   [employer] Kathryn Frizzle, MD   1 year ago Type 2 diabetes mellitus with hyperlipidemia ([place])   [place] Kathryn Frizzle, MD   2 years ago Type 2 diabetes mellitus with hyperlipidemia ([place])   [place] Pickard, Kathryn Mcgee, MD   3 years ago Carpal tunnel syndrome of right wrist   [place] Kathryn Frizzle, MD              Failed - Lipid Panel in normal range within the last 12 months    Cholesterol  Date Value Ref Range Status  12/15/2021 136 <200 mg/dL Final   LDL Cholesterol (Calc)  Date Value Ref Range Status  12/15/2021 61 mg/dL (calc) Final    Comment:    Reference range: <100 . Desirable range <100 mg/dL for primary prevention;   <70 mg/dL for patients with CHD or diabetic patients  with > or = 2 CHD risk factors. Marland Kitchen LDL-C is now calculated using the Martin-Hopkins  calculation, which is a validated novel method providing  better accuracy than the Friedewald equation in the  estimation of LDL-C.  Cresenciano Genre et al. Annamaria Helling. 0321;224(82): 2061-2068  (http://education.[facility].com/faq/FAQ164)    Direct LDL  Date Value Ref Range Status  11/22/2019 62 <100 mg/dL Final    Comment:    Greatly elevated Triglycerides values (>1200 mg/dL) interfere with the dLDL assay. As no  Triglycerides  testing was ordered, interpret results with caution. . Desirable range <100 mg/dL for primary prevention;   <70 mg/dL for patients with CHD or diabetic patients  with > or = 2 CHD risk factors. Marland Kitchen    HDL  Date Value Ref Range Status  12/15/2021 48 (L) > OR = 50 mg/dL Final   Triglycerides  Date Value Ref Range Status  12/15/2021 195 (H) <150 mg/dL Final         Passed - TSH in normal range and within 360 days    TSH  Date Value Ref Range Status  12/15/2021 2.10 0.40 - 4.50 mIU/L Final         Passed - Last BP in normal range    BP Readings from Last 1 Encounters:  03/27/22 139/85         Passed - Last Heart Rate in normal range    Pulse Readings from Last 1 Encounters:  03/27/22 66         Passed - CBC within normal limits and completed in the last 12 months    WBC  Date Value Ref Range Status  03/26/2022 9.2 4.0 - 10.5 K/uL Final   RBC  Date Value Ref Range Status  03/26/2022 3.78 (L) 3.87 - 5.11 MIL/uL Final  Hemoglobin  Date Value Ref Range Status  03/26/2022 10.0 (L) 12.0 - 15.0 g/dL Final  12/16/2020 11.8 11.1 - 15.9 g/dL Final   HCT  Date Value Ref Range Status  03/26/2022 32.1 (L) 36.0 - 46.0 % Final   Hematocrit  Date Value Ref Range Status  12/16/2020 36.5 34.0 - 46.6 % Final   MCHC  Date Value Ref Range Status  03/26/2022 31.2 30.0 - 36.0 g/dL Final   [facility]  Date Value Ref Range Status  03/26/2022 26.5 26.0 - 34.0 pg Final   MCV  Date Value Ref Range Status  03/26/2022 84.9 80.0 - 100.0 fL Final  12/16/2020 91 79 - 97 fL Final   No results found for: "PLTCOUNTKUC", "LABPLAT", "POCPLA" RDW  Date Value Ref Range Status  03/26/2022 17.2 (H) 11.5 - 15.5 % Final  12/16/2020 13.7 11.7 - 15.4 % Final         Passed - CMP within normal limits and completed in the last 12 months    Albumin  Date Value Ref Range Status  01/26/2022 4.5 3.5 - 5.0 g/dL Final  12/16/2020 4.5 3.7 - 4.7 g/dL Final   Alkaline Phosphatase  Date  Value Ref Range Status  01/26/2022 83 38 - 126 U/L Final   Alkaline phosphatase (APISO)  Date Value Ref Range Status  12/15/2021 99 37 - 153 U/L Final   ALT  Date Value Ref Range Status  01/26/2022 13 0 - 44 U/L Final   AST  Date Value Ref Range Status  01/26/2022 20 15 - 41 U/L Final   BUN  Date Value Ref Range Status  03/27/2022 11 8 - 23 mg/dL Final  12/16/2020 17 8 - 27 mg/dL Final   Calcium  Date Value Ref Range Status  03/27/2022 9.3 8.9 - 10.3 mg/dL Final   CO2  Date Value Ref Range Status  03/27/2022 23 22 - 32 mmol/L Final   Creat  Date Value Ref Range Status  12/15/2021 1.30 (H) 0.60 - 1.00 mg/dL Final   Creatinine, Ser  Date Value Ref Range Status  03/27/2022 0.95 0.44 - 1.00 mg/dL Final   Creatinine, Urine  Date Value Ref Range Status  05/28/2011 23.13 mg/dL Final   Glucose, Bld  Date Value Ref Range Status  03/27/2022 107 (H) 70 - 99 mg/dL Final    Comment:    Glucose reference range applies only to samples taken after fasting for at least 8 hours.   Glucose-Capillary  Date Value Ref Range Status  03/27/2022 151 (H) 70 - 99 mg/dL Final    Comment:    Glucose reference range applies only to samples taken after fasting for at least 8 hours.   Potassium  Date Value Ref Range Status  03/27/2022 4.3 3.5 - 5.1 mmol/L Final   Sodium  Date Value Ref Range Status  03/27/2022 138 135 - 145 mmol/L Final  12/16/2020 143 134 - 144 mmol/L Final   Total Bilirubin  Date Value Ref Range Status  01/26/2022 0.7 0.3 - 1.2 mg/dL Final   Bilirubin Total  Date Value Ref Range Status  12/16/2020 0.3 0.0 - 1.2 mg/dL Final   Bilirubin, Direct  Date Value Ref Range Status  06/18/2016 0.1 <=0.2 mg/dL Final   Indirect Bilirubin  Date Value Ref Range Status  06/18/2016 0.4 0.2 - 1.2 mg/dL Final   Protein, ur  Date Value Ref Range Status  01/27/2022 TRACE (A) NEGATIVE mg/dL Final   Total Protein  Date Value Ref Range Status  01/26/2022 7.3 6.5 - 8.1  g/dL Final  12/16/2020 7.4 6.0 - 8.5 g/dL Final   GFR, Est African American  Date Value Ref Range Status  08/11/2020 56 (L) > OR = 60 mL/min/1.44m Final   eGFR  Date Value Ref Range Status  12/15/2021 42 (L) > OR = 60 mL/min/1.747mFinal    Comment:    The eGFR is based on the CKD-EPI 2021 equation. To calculate  the new eGFR from a previous Creatinine or Cystatin C result, go to https://www.kidney.org/professionals/ kdoqi/gfr%5Fcalculator   12/16/2020 50 (L) >59 mL/min/1.73 Final   GFR, Est Non African American  Date Value Ref Range Status  08/11/2020 48 (L) > OR = 60 mL/min/1.7356minal   GFR, Estimated  Date Value Ref Range Status  03/27/2022 >60 >60 mL/min Final    Comment:    (NOTE) Calculated using the CKD-EPI Creatinine Equation (2021)           pantoprazole (PROTONIX) 40 MG tablet [Pharmacy Med Name: PANTOPRAZOLE SODIUM 40 MG DR TAB] 90 tablet 3    Sig: TAKE 1 TABLET BY MOUTH EVERY MORNING     Gastroenterology: Proton Pump Inhibitors Failed - 05/18/2022  4:45 PM      Failed - Valid encounter within last 12 months    Recent Outpatient Visits           1 year ago Type 2 [place] with hyperlipidemia [place] WarCammie McgeeD   1 year ago Bibasilar crackles   BroCartercSusy FrizzleD   1 year ago Type 2 diabetes mellitus with hyperlipidemia ([place] BroChurchillcSusy FrizzleD   2 years ago Type 2 diabetes mellitus with hyperlipidemia ([place], WarCammie McgeeD   3 years ago Carpal tunnel syndrome of right wrist   BroPetroleumckard, WarCammie McgeeD

## 2022-05-18 NOTE — Telephone Encounter (Signed)
Requested medications are due for refill today.  yes  Requested medications are on the active medications list.  yes  Last refill. 03/16/2022 #30 1 rf  Future visit scheduled.   no  Notes to clinic.  Refill not delegated.    Requested Prescriptions  Pending Prescriptions Disp Refills   QUEtiapine (SEROQUEL) 50 MG tablet [Pharmacy Med Name: QUETIAPINE FUMARATE 50 MG TAB] 30 tablet 1    Sig: TAKE 1 TABLET BY MOUTH DAILY AT BEDTIME     Not Delegated - Psychiatry:  Antipsychotics - Second Generation (Atypical) - quetiapine Failed - 05/18/2022  4:45 PM      Failed - This refill cannot be delegated      Failed - Valid encounter within last 6 months    Recent Outpatient Visits           1 year ago Type 2 diabetes mellitus with hyperlipidemia ([place])   [place] Pickard, Cammie Mcgee, MD   1 year ago Bibasilar crackles   [place] [name], MD   1 year ago Type 2 diabetes mellitus with hyperlipidemia ([place])   [place] [name], MD   2 years ago Type 2 diabetes mellitus with hyperlipidemia ([place])   [place] Pickard, Cammie Mcgee, MD   3 years ago Carpal tunnel syndrome of right wrist   [place] [name], MD              Failed - Lipid Panel in normal range within the last 12 months    Cholesterol  Date Value Ref Range Status  12/15/2021 136 <200 mg/dL Final   LDL Cholesterol (Calc)  Date Value Ref Range Status  12/15/2021 61 mg/dL (calc) Final    Comment:    Reference range: <100 . Desirable range <100 mg/dL for primary prevention;   <70 mg/dL for patients with CHD or diabetic patients  with > or = 2 CHD risk factors. Marland Kitchen LDL-C is now calculated using the Martin-Hopkins  calculation, which is a validated novel method providing  better accuracy than the Friedewald equation in the  estimation of LDL-C.  Cresenciano Genre et al. Annamaria Helling. 8841;660(63): 2061-2068   (http://education.[facility].com/faq/FAQ164)    Direct LDL  Date Value Ref Range Status  11/22/2019 62 <100 mg/dL Final    Comment:    Greatly elevated Triglycerides values (>1200 mg/dL) interfere with the dLDL assay. As no Triglycerides  testing was ordered, interpret results with caution. . Desirable range <100 mg/dL for primary prevention;   <70 mg/dL for patients with CHD or diabetic patients  with > or = 2 CHD risk factors. Marland Kitchen    HDL  Date Value Ref Range Status  12/15/2021 48 (L) > OR = 50 mg/dL Final   Triglycerides  Date Value Ref Range Status  12/15/2021 195 (H) <150 mg/dL Final         Passed - TSH in normal range and within 360 days    TSH  Date Value Ref Range Status  12/15/2021 2.10 0.40 - 4.50 mIU/L Final         Passed - Last BP in normal range    BP Readings from Last 1 Encounters:  03/27/22 139/85         Passed - Last Heart Rate in normal range    Pulse Readings from Last 1 Encounters:  03/27/22 66         Passed - CBC within normal limits  and completed in the last 12 months    WBC  Date Value Ref Range Status  03/26/2022 9.2 4.0 - 10.5 K/uL Final   RBC  Date Value Ref Range Status  03/26/2022 3.78 (L) 3.87 - 5.11 MIL/uL Final   Hemoglobin  Date Value Ref Range Status  03/26/2022 10.0 (L) 12.0 - 15.0 g/dL Final  12/16/2020 11.8 11.1 - 15.9 g/dL Final   HCT  Date Value Ref Range Status  03/26/2022 32.1 (L) 36.0 - 46.0 % Final   Hematocrit  Date Value Ref Range Status  12/16/2020 36.5 34.0 - 46.6 % Final   MCHC  Date Value Ref Range Status  03/26/2022 31.2 30.0 - 36.0 g/dL Final   [facility]  Date Value Ref Range Status  03/26/2022 26.5 26.0 - 34.0 pg Final   MCV  Date Value Ref Range Status  03/26/2022 84.9 80.0 - 100.0 fL Final  12/16/2020 91 79 - 97 fL Final   No results found for: "PLTCOUNTKUC", "LABPLAT", "POCPLA" RDW  Date Value Ref Range Status  03/26/2022 17.2 (H) 11.5 - 15.5 % Final  12/16/2020 13.7 11.7 - 15.4  % Final         Passed - CMP within normal limits and completed in the last 12 months    Albumin  Date Value Ref Range Status  01/26/2022 4.5 3.5 - 5.0 g/dL Final  12/16/2020 4.5 3.7 - 4.7 g/dL Final   Alkaline Phosphatase  Date Value Ref Range Status  01/26/2022 83 38 - 126 U/L Final   Alkaline phosphatase (APISO)  Date Value Ref Range Status  12/15/2021 99 37 - 153 U/L Final   ALT  Date Value Ref Range Status  01/26/2022 13 0 - 44 U/L Final   AST  Date Value Ref Range Status  01/26/2022 20 15 - 41 U/L Final   BUN  Date Value Ref Range Status  03/27/2022 11 8 - 23 mg/dL Final  12/16/2020 17 8 - 27 mg/dL Final   Calcium  Date Value Ref Range Status  03/27/2022 9.3 8.9 - 10.3 mg/dL Final   CO2  Date Value Ref Range Status  03/27/2022 23 22 - 32 mmol/L Final   Creat  Date Value Ref Range Status  12/15/2021 1.30 (H) 0.60 - 1.00 mg/dL Final   Creatinine, Ser  Date Value Ref Range Status  03/27/2022 0.95 0.44 - 1.00 mg/dL Final   Creatinine, Urine  Date Value Ref Range Status  05/28/2011 23.13 mg/dL Final   Glucose, Bld  Date Value Ref Range Status  03/27/2022 107 (H) 70 - 99 mg/dL Final    Comment:    Glucose reference range applies only to samples taken after fasting for at least 8 hours.   Glucose-Capillary  Date Value Ref Range Status  03/27/2022 151 (H) 70 - 99 mg/dL Final    Comment:    Glucose reference range applies only to samples taken after fasting for at least 8 hours.   Potassium  Date Value Ref Range Status  03/27/2022 4.3 3.5 - 5.1 mmol/L Final   Sodium  Date Value Ref Range Status  03/27/2022 138 135 - 145 mmol/L Final  12/16/2020 143 134 - 144 mmol/L Final   Total Bilirubin  Date Value Ref Range Status  01/26/2022 0.7 0.3 - 1.2 mg/dL Final   Bilirubin Total  Date Value Ref Range Status  12/16/2020 0.3 0.0 - 1.2 mg/dL Final   Bilirubin, Direct  Date Value Ref Range Status  06/18/2016 0.1 <=0.2 mg/dL Final  Indirect  Bilirubin  Date Value Ref Range Status  06/18/2016 0.4 0.2 - 1.2 mg/dL Final   Protein, ur  Date Value Ref Range Status  01/27/2022 TRACE (A) NEGATIVE mg/dL Final   Total Protein  Date Value Ref Range Status  01/26/2022 7.3 6.5 - 8.1 g/dL Final  12/16/2020 7.4 6.0 - 8.5 g/dL Final   GFR, Est African American  Date Value Ref Range Status  08/11/2020 56 (L) > OR = 60 mL/min/1.21m Final   eGFR  Date Value Ref Range Status  12/15/2021 42 (L) > OR = 60 mL/min/1.743mFinal    Comment:    The eGFR is based on the CKD-EPI 2021 equation. To calculate  the new eGFR from a previous Creatinine or Cystatin C result, go to https://www.kidney.org/professionals/ kdoqi/gfr%5Fcalculator   12/16/2020 50 (L) >59 mL/min/1.73 Final   GFR, Est Non African American  Date Value Ref Range Status  08/11/2020 48 (L) > OR = 60 mL/min/1.7327minal   GFR, Estimated  Date Value Ref Range Status  03/27/2022 >60 >60 mL/min Final    Comment:    (NOTE) Calculated using the CKD-EPI Creatinine Equation (2021)          Refused Prescriptions Disp Refills   pantoprazole (PROTONIX) 40 MG tablet [Pharmacy Med Name: PANTOPRAZOLE SODIUM 40 MG DR TAB] 90 tablet 3    Sig: TAKE 1 TABLET BY MOUTH EVERY MORNING     Gastroenterology: Proton Pump Inhibitors Failed - 05/18/2022  4:45 PM      Failed - Valid encounter within last 12 months    Recent Outpatient Visits           1 year ago Type 2 diabetes mellitus with hyperlipidemia ([place]ck[place], WarCammie McgeeD   1 year ago Bibasilar crackles   BroKinseycSusy FrizzleD   1 year ago Type 2 diabetes mellitus with hyperlipidemia ([place]Sus[place] FrizzleD   2 years ago Type 2 diabetes mellitus with hyperlipidemia ([place] BroCath[place]ckard, WarCammie McgeeD   3 years ago Carpal tunnel syndrome of right wrist   BroDresserckard, WarCammie McgeeD

## 2022-05-19 ENCOUNTER — Other Ambulatory Visit: Payer: Self-pay | Admitting: Family Medicine

## 2022-05-19 MED ORDER — QUETIAPINE FUMARATE 50 MG PO TABS: 50.0000 mg | ORAL_TABLET | Freq: Every day | ORAL | 0 refills | Status: DC

## 2022-05-21 ENCOUNTER — Other Ambulatory Visit: Payer: Self-pay | Admitting: Family Medicine

## 2022-07-06 ENCOUNTER — Other Ambulatory Visit: Payer: Self-pay | Admitting: Family Medicine

## 2022-07-12 ENCOUNTER — Other Ambulatory Visit: Payer: Self-pay | Admitting: Family Medicine

## 2022-07-12 ENCOUNTER — Telehealth: Payer: Self-pay | Admitting: Family Medicine

## 2022-07-12 ENCOUNTER — Other Ambulatory Visit: Payer: Self-pay

## 2022-07-12 DIAGNOSIS — I10 Essential (primary) hypertension: Secondary | ICD-10-CM

## 2022-07-12 DIAGNOSIS — K219 Gastro-esophageal reflux disease without esophagitis: Secondary | ICD-10-CM

## 2022-07-12 MED ORDER — METOPROLOL SUCCINATE ER 25 MG PO TB24: 25.0000 mg | ORAL_TABLET | Freq: Every day | ORAL | 1 refills | Status: DC

## 2022-07-12 MED ORDER — PANTOPRAZOLE SODIUM 40 MG PO TBEC: 40.0000 mg | DELAYED_RELEASE_TABLET | Freq: Every morning | ORAL | 1 refills | Status: DC

## 2022-07-12 NOTE — Telephone Encounter (Signed)
Patient called to request courtesy refills of the following meds for enough to last until her upcoming cpe scheduled for 08/05/22:  metoprolol succinate (TOPROL-XL) 25 MG 24 hr tablet [[id number]]   pantoprazole (PROTONIX) 40 MG tablet [[id number]]   Pharmacy confirmed as:   [facility] [place] [address] [place], [place] [address] Phone: [phone]  Fax: [phone]   Please advise patient when refills sent in at [phone].

## 2022-08-05 ENCOUNTER — Ambulatory Visit (INDEPENDENT_AMBULATORY_CARE_PROVIDER_SITE_OTHER): Payer: Medicare Other | Admitting: Family Medicine

## 2022-08-05 ENCOUNTER — Encounter: Payer: Self-pay | Admitting: Family Medicine

## 2022-08-05 VITALS — BP 126/78 | HR 61 | Temp 98.3°F | Ht 65.0 in | Wt 163.0 lb

## 2022-08-05 DIAGNOSIS — Z0000 Encounter for general adult medical examination without abnormal findings: Secondary | ICD-10-CM

## 2022-08-05 DIAGNOSIS — Z0001 Encounter for general adult medical examination with abnormal findings: Secondary | ICD-10-CM

## 2022-08-05 DIAGNOSIS — E1169 Type 2 diabetes mellitus with other specified complication: Secondary | ICD-10-CM

## 2022-08-05 DIAGNOSIS — Z1231 Encounter for screening mammogram for malignant neoplasm of breast: Secondary | ICD-10-CM

## 2022-08-05 DIAGNOSIS — I10 Essential (primary) hypertension: Secondary | ICD-10-CM

## 2022-08-05 DIAGNOSIS — R413 Other amnesia: Secondary | ICD-10-CM

## 2022-08-05 DIAGNOSIS — E785 Hyperlipidemia, unspecified: Secondary | ICD-10-CM

## 2022-08-05 DIAGNOSIS — Z1211 Encounter for screening for malignant neoplasm of colon: Secondary | ICD-10-CM

## 2022-08-05 MED ORDER — VENLAFAXINE HCL ER 75 MG PO CP24: 75.0000 mg | ORAL_CAPSULE | Freq: Every day | ORAL | 3 refills | Status: DC

## 2022-08-05 NOTE — Progress Notes (Signed)
Subjective:    Patient ID: Kathryn Wall, female    DOB: [birth year], 77 y.o.   MRN: [id number] Patient is a very sweet 77 year old Caucasian female who seems to be doing remarkably well since I last saw her.  She did suffer a fall and a fracture to the dorsum of her left midfoot.  This required several surgeries.  Per her report got complicated by an infection.  It is finally healed and she is ambulating with a cane.  There is hyperpigmentation of the skin covering the entire foot from the residual inflammation however she denies any significant pain.  She does have some numbness.  She has not been checking her blood sugar.  She denies any symptoms of hypoglycemia.  She denies any chest pain or shortness of breath or dyspnea on exertion.  She denies any significant bones.  She does have some mild short-term memory issues however these have remained remarkably stable and seem to be much better after she stopped Xanax.  However she does report worsening depression.  She has been on Paxil for a year.  She lost her husband and this Christmas has been very difficult for her.  She reports anhedonia and sadness and lack of energy.  She has tried Lexapro and Zoloft in the past without benefit Immunization History  Administered Date(s) Administered   Fluad Quad(high Dose 65+) 05/03/2019, 03/27/2022   Influenza Split 05/31/2011   Influenza, High Dose Seasonal PF 04/30/2013, 03/17/2017   Influenza,inj,Quad PF,6+ Mos 04/21/2015, 05/25/2016   PFIZER(Purple Top)SARS-COV-2 Vaccination 07/18/2019, 08/08/2019   Pneumococcal Conjugate-13 08/08/2013, 10/18/2013   Pneumococcal Polysaccharide-23 05/31/2011    Past Medical History:  Diagnosis Date   Anxiety    Breast cancer ([place]) 06/29/1995   AGE 19, BRCA 1 NEGATIVE 2. 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    CKD (chronic kidney disease), stage III (HCC)    Cognitive deficits    mild   Depression    Diabetes mellitus    TYPE II   High  cholesterol    Hypertension    PAF (paroxysmal atrial fibrillation) ([place])    Past Surgical History:  Procedure Laterality Date   ABDOMINAL HYSTERECTOMY  06/29/1983   TAH   ANKLE SURGERY Right    APPLICATION OF WOUND VAC Left 01/14/2022   Procedure: APPLICATION OF WOUND VAC;  Surgeon: Wallace Going, DO;  Location: [facility];  Service: Plastics;  Laterality: Left;   BREAST SURGERY  06/29/1995   RIGHT BREAST LUMPECTOMY   CHOLECYSTECTOMY  06/28/1978   COLONOSCOPY     Current Outpatient Medications on File Prior to Visit  Medication Sig Dispense Refill   acetaminophen (TYLENOL) 325 MG tablet Take 2 tablets (650 mg total) by mouth every 6 (six) hours as needed for mild pain (or Fever >/= 101).     amLODipine (NORVASC) 5 MG tablet TAKE 1 TABLET BY MOUTH ONCE A DAY 90 tablet 3   atorvastatin (LIPITOR) 40 MG tablet TAKE 1 TABLET BY MOUTH ONCE A DAY 30 tablet 3   calcium carbonate (OS-CAL - DOSED IN MG OF ELEMENTAL CALCIUM) 1250 (500 Ca) MG tablet Take 1 tablet (1,250 mg total) by mouth 2 (two) times daily with a meal.     cyanocobalamin (VITAMIN B12) 1000 MCG tablet Take 1 tablet (1,000 mcg total) by mouth daily.     ELIQUIS 5 MG TABS tablet TAKE 1 TABLET BY MOUTH TWICE A DAY 180 tablet 3   JARDIANCE 25 MG TABS tablet TAKE 1 TABLET  BY MOUTH ONCE A DAY 90 tablet 3   losartan (COZAAR) 50 MG tablet TAKE 1 TABLET BY MOUTH ONCE A DAY (Patient taking differently: Take 50 mg by mouth at bedtime.) 90 tablet 3   metFORMIN (GLUCOPHAGE-XR) 500 MG 24 hr tablet TAKE 4 TABLETS BY MOUTH ONCE DAILY WITH BREAKFAST. 360 tablet 3   metoprolol succinate (TOPROL-XL) 25 MG 24 hr tablet Take 1 tablet (25 mg total) by mouth daily. TAKE 1 TABLET BY MOUTH ONCE A DAY Strength: 25 mg 30 tablet 1   MYRBETRIQ 25 MG TB24 tablet TAKE 1 TABLET BY MOUTH DAILY 30 tablet 3   pantoprazole (PROTONIX) 40 MG tablet Take 1 tablet (40 mg total) by mouth every morning. 30 tablet 1   PARoxetine (PAXIL) 20 MG tablet TAKE 1 TABLET BY  MOUTH ONCE A DAY 90 tablet 0   QUEtiapine (SEROQUEL) 50 MG tablet TAKE 1 TABLET BY MOUTH EVERY NIGHT AT BEDTIME 30 tablet 0   No current facility-administered medications on file prior to visit.   Allergies  Allergen Reactions   Ambien [Zolpidem Tartrate] Other (See Comments)    Can not tolerate, causes sleepwalking   Cymbalta [Duloxetine Hcl] Other (See Comments)    Unknown Reaction   Other     SENSITIVE TO ANTIBIOTICS   Ciprofloxacin Nausea Only    Nausea    Doxazosin Nausea And Vomiting   Percocet [Oxycodone-Acetaminophen] Nausea And Vomiting   Social History   Socioeconomic History   Marital status: Married    Spouse name: Not on file   Number of children: Not on file   Years of education: Not on file   Highest education level: Not on file  Occupational History   Not on file  Tobacco Use   Smoking status: Never   Smokeless tobacco: Never  Vaping Use   Vaping Use: Never used  Substance and Sexual Activity   Alcohol use: No   Drug use: No   Sexual activity: Yes    Birth control/protection: Post-menopausal, Surgical    Comment: hysterectomy  Other Topics Concern   Not on file  Social History Narrative   Not on file   Social Determinants of Health   Financial Resource Strain: Not on file  Food Insecurity: No Food Insecurity (03/24/2022)   Hunger Vital Sign    Worried About Running Out of Food in the Last Year: Never true    Ran Out of Food in the Last Year: Never true  Transportation Needs: No Transportation Needs (03/24/2022)   PRAPARE - Hydrologist (Medical): No    Lack of Transportation (Non-Medical): No  Physical Activity: Not on file  Stress: Not on file  Social Connections: Not on file  Intimate Partner Violence: Not At Risk (03/24/2022)   Humiliation, Afraid, Rape, and Kick questionnaire    Fear of Current or Ex-Partner: No    Emotionally Abused: No    Physically Abused: No    Sexually Abused: No   Family History   Problem Relation Age of Onset   Cancer Mother        COLON   Hypertension Father    Heart disease Father       Review of Systems  All other systems reviewed and are negative.      Objective:   Physical Exam Vitals reviewed.  Constitutional:      General: She is not in acute distress.    Appearance: She is well-developed. She is not diaphoretic.  HENT:  Head: Normocephalic and atraumatic.     Right Ear: External ear normal.     Left Ear: External ear normal.     Nose: Nose normal.     Mouth/Throat:     Pharynx: No oropharyngeal exudate.  Eyes:     General: No scleral icterus.       Right eye: No discharge.        Left eye: No discharge.     Conjunctiva/sclera: Conjunctivae normal.     Pupils: Pupils are equal, round, and reactive to light.  Neck:     Thyroid: No thyromegaly.     Trachea: No tracheal deviation.  Cardiovascular:     Rate and Rhythm: Normal rate and regular rhythm.     Heart sounds: Normal heart sounds. No murmur heard.    No friction rub. No gallop.  Pulmonary:     Effort: Pulmonary effort is normal. No respiratory distress.     Breath sounds: Normal breath sounds. No stridor. No wheezing or rales.  Abdominal:     General: Bowel sounds are normal. There is no distension.     Palpations: Abdomen is soft. There is no mass.     Tenderness: There is no abdominal tenderness. There is no guarding or rebound.  Musculoskeletal:     Cervical back: Normal range of motion and neck supple.  Lymphadenopathy:     Cervical: No cervical adenopathy.  Skin:    General: Skin is warm.     Coloration: Skin is not pale.     Findings: No erythema or rash.  Neurological:     Mental Status: She is alert and oriented to person, place, and time.     Cranial Nerves: No cranial nerve deficit.     Motor: No abnormal muscle tone.     Coordination: Coordination normal.     Deep Tendon Reflexes: Reflexes normal.  Psychiatric:        Mood and Affect: Mood normal.         Speech: Speech normal.        Behavior: Behavior normal.        Thought Content: Thought content normal.        Judgment: Judgment normal.           Assessment & Plan:  Encounter for screening mammogram for malignant neoplasm of breast - Plan: MM Digital Screening  Colon cancer screening - Plan: Cologuard  Type 2 diabetes mellitus with hyperlipidemia ([place]) - Plan: CBC with Differential/Platelet, COMPLETE METABOLIC PANEL WITH GFR, Hemoglobin A1c, Lipid panel, Protein / Creatinine Ratio, Urine  Memory loss  Essential hypertension, benign  General medical exam Patient has done remarkably well over the last year.  Her memory loss seems stable or even slightly better compared to last year.  I think the best thing that ever happened her was getting away from the Xanax.  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  However she seems to do well with this.  She does report depression so I will stop Paxil and replace with venlafaxine 75 mg p.o. every morning.  Her blood pressure today is excellent.  I will check a CBC a CMP a lipid panel and and a urine protein to creatinine ratio.  Also check a hemoglobin A1c.  Goal A1c is less than 7.  Goal LDL cholesterol is less than 100.  Goal protein to creatinine ratio is less than 200.  I offered the patient physical therapy to improve the strength in her legs but  she politely declines and prefers to do physical therapy on her own.  She would like to screen for cancer so I will schedule her for mammogram screening for breast cancer and a Cologuard to screen for colon cancer.  Her bone density test was last in 2020 and showed osteopenia.  I recommended calcium and vitamin D which she is not currently taking.  Immunizations are up-to-date except for COVID shot which I recommended an RSV vaccine

## 2022-08-06 ENCOUNTER — Other Ambulatory Visit: Payer: Self-pay | Admitting: Family Medicine

## 2022-08-06 ENCOUNTER — Other Ambulatory Visit: Payer: Self-pay

## 2022-08-06 LAB — PROTEIN / CREATININE RATIO, URINE
Creatinine, Urine: 58 mg/dL (ref 20–275)
Protein/Creat Ratio: 397 mg/g creat — ABNORMAL HIGH (ref 24–184)
Protein/Creatinine Ratio: 0.397 mg/mg creat — ABNORMAL HIGH (ref 0.024–0.184)
Total Protein, Urine: 23 mg/dL (ref 5–24)

## 2022-08-06 LAB — COMPLETE METABOLIC PANEL WITH GFR
AG Ratio: 1.6 (calc) (ref 1.0–2.5)
ALT: 14 U/L (ref 6–29)
AST: 18 U/L (ref 10–35)
Albumin: 4.4 g/dL (ref 3.6–5.1)
Alkaline phosphatase (APISO): 86 U/L (ref 37–153)
BUN/Creatinine Ratio: 14 (calc) (ref 6–22)
BUN: 16 mg/dL (ref 7–25)
CO2: 21 mmol/L (ref 20–32)
Calcium: 9.3 mg/dL (ref 8.6–10.4)
Chloride: 107 mmol/L (ref 98–110)
Creat: 1.11 mg/dL — ABNORMAL HIGH (ref 0.60–1.00)
Globulin: 2.7 g/dL (calc) (ref 1.9–3.7)
Glucose, Bld: 170 mg/dL — ABNORMAL HIGH (ref 65–99)
Potassium: 4.5 mmol/L (ref 3.5–5.3)
Sodium: 140 mmol/L (ref 135–146)
Total Bilirubin: 0.5 mg/dL (ref 0.2–1.2)
Total Protein: 7.1 g/dL (ref 6.1–8.1)
eGFR: 51 mL/min/{1.73_m2} — ABNORMAL LOW (ref >=60)

## 2022-08-06 LAB — CBC WITH DIFFERENTIAL/PLATELET
Absolute Monocytes: 496 cells/uL (ref 200–950)
Basophils Absolute: 92 cells/uL (ref 0–200)
Basophils Relative: 1.1 %
Eosinophils Absolute: 311 cells/uL (ref 15–500)
Eosinophils Relative: 3.7 %
HCT: 43.3 % (ref 35.0–45.0)
Hemoglobin: 14.2 g/dL (ref 11.7–15.5)
Lymphs Abs: 2360 cells/uL (ref 850–3900)
MCH: 29.9 pg (ref 27.0–33.0)
MCHC: 32.8 g/dL (ref 32.0–36.0)
MCV: 91.2 fL (ref 80.0–100.0)
MPV: 13.2 fL — ABNORMAL HIGH (ref 7.5–12.5)
Monocytes Relative: 5.9 %
Neutro Abs: 5141 cells/uL (ref 1500–7800)
Neutrophils Relative %: 61.2 %
Platelets: 268 10*3/uL (ref 140–400)
RBC: 4.75 10*6/uL (ref 3.80–5.10)
RDW: 12.8 % (ref 11.0–15.0)
Total Lymphocyte: 28.1 %
WBC: 8.4 10*3/uL (ref 3.8–10.8)

## 2022-08-06 LAB — LIPID PANEL
Cholesterol: 142 mg/dL (ref <200)
HDL: 50 mg/dL (ref >=50)
LDL Cholesterol (Calc): 64 mg/dL (calc)
Non-HDL Cholesterol (Calc): 92 mg/dL (calc) (ref <130)
Total CHOL/HDL Ratio: 2.8 (calc) (ref <5.0)
Triglycerides: 228 mg/dL — ABNORMAL HIGH (ref <150)

## 2022-08-06 LAB — HEMOGLOBIN A1C
Hgb A1c MFr Bld: 6.4 % of total Hgb — ABNORMAL HIGH (ref <5.7)
Mean Plasma Glucose: 137 mg/dL
eAG (mmol/L): 7.6 mmol/L

## 2022-08-09 ENCOUNTER — Other Ambulatory Visit: Payer: Self-pay | Admitting: Family Medicine

## 2022-08-09 MED ORDER — QUETIAPINE FUMARATE 50 MG PO TABS: 50.0000 mg | ORAL_TABLET | Freq: Every day | ORAL | 5 refills | Status: DC

## 2022-09-13 ENCOUNTER — Other Ambulatory Visit: Payer: Self-pay | Admitting: Family Medicine

## 2022-09-13 DIAGNOSIS — K219 Gastro-esophageal reflux disease without esophagitis: Secondary | ICD-10-CM

## 2022-09-15 ENCOUNTER — Other Ambulatory Visit: Payer: Self-pay | Admitting: Family Medicine

## 2022-09-15 DIAGNOSIS — I10 Essential (primary) hypertension: Secondary | ICD-10-CM

## 2022-09-30 ENCOUNTER — Ambulatory Visit: Payer: Medicare Other | Admitting: Family Medicine

## 2022-11-02 ENCOUNTER — Other Ambulatory Visit: Payer: Self-pay | Admitting: Family Medicine

## 2022-11-02 NOTE — Telephone Encounter (Signed)
Requested Prescriptions  Pending Prescriptions Disp Refills   losartan (COZAAR) 50 MG tablet [Pharmacy Med Name: LOSARTAN POTASSIUM 50MG  TABLET] 100 tablet 0    Sig: TAKE ONE TABLET BY MOUTH ONCE A DAY     Cardiovascular:  Angiotensin Receptor Blockers Failed - 11/02/2022  3:43 PM      Failed - Cr in normal range and within 180 days    Creat  Date Value Ref Range Status  08/05/2022 1.11 (H) 0.60 - 1.00 mg/dL Final   Creatinine, Urine  Date Value Ref Range Status  08/05/2022 58 20 - 275 mg/dL Final         Failed - Valid encounter within last 6 months    Recent Outpatient Visits           1 year ago Type 2 diabetes mellitus with hyperlipidemia (HCC)   [facility] Pickard, Priscille Heidelberg, MD   2 years ago Bibasilar crackles   [facility] Donita Brooks, MD   2 years ago Type 2 diabetes mellitus with hyperlipidemia (HCC)   [facility] Donita Brooks, MD   2 years ago Type 2 diabetes mellitus with hyperlipidemia (HCC)   [facility] Donita Brooks, MD   3 years ago Carpal tunnel syndrome of right wrist   [facility] Medicine Pickard, Priscille Heidelberg, MD              Passed - K in normal range and within 180 days    Potassium  Date Value Ref Range Status  08/05/2022 4.5 3.5 - 5.3 mmol/L Final         Passed - Patient is not pregnant      Passed - Last BP in normal range    BP Readings from Last 1 Encounters:  08/05/22 126/78          atorvastatin (LIPITOR) 40 MG tablet [Pharmacy Med Name: ATORVASTATIN CALCIUM 40MG  TABLET] 90 tablet 0    Sig: TAKE ONE TABLET BY MOUTH ONCE A DAY     Cardiovascular:  Antilipid - Statins Failed - 11/02/2022  3:43 PM      Failed - Valid encounter within last 12 months    Recent Outpatient Visits           1 year ago Type 2 diabetes mellitus with hyperlipidemia (HCC)   [facility] Pickard, Priscille Heidelberg, MD   2 years ago Bibasilar crackles   [facility]  Family Medicine Donita Brooks, MD   2 years ago Type 2 diabetes mellitus with hyperlipidemia (HCC)   [facility] Donita Brooks, MD   2 years ago Type 2 diabetes mellitus with hyperlipidemia (HCC)   [facility] Pickard, Priscille Heidelberg, MD   3 years ago Carpal tunnel syndrome of right wrist   [facility] Pickard, Priscille Heidelberg, MD              Failed - Lipid Panel in normal range within the last 12 months    Cholesterol  Date Value Ref Range Status  08/05/2022 142 <200 mg/dL Final   LDL Cholesterol (Calc)  Date Value Ref Range Status  08/05/2022 64 mg/dL (calc) Final    Comment:    Reference range: <100 . Desirable range <100 mg/dL for primary prevention;   <70 mg/dL for patients with CHD or diabetic patients  with > or = 2 CHD risk factors. Marland Kitchen LDL-C is now calculated  using the Martin-Hopkins  calculation, which is a validated novel method providing  better accuracy than the Friedewald equation in the  estimation of LDL-C.  Horald Pollen et al. Lenox Ahr. 6578;469(62): 2061-2068  (http://education.[facility].com/faq/FAQ164)    Direct LDL  Date Value Ref Range Status  11/22/2019 62 <100 mg/dL Final    Comment:    Greatly elevated Triglycerides values (>1200 mg/dL) interfere with the dLDL assay. As no Triglycerides  testing was ordered, interpret results with caution. . Desirable range <100 mg/dL for primary prevention;   <70 mg/dL for patients with CHD or diabetic patients  with > or = 2 CHD risk factors. Marland Kitchen    HDL  Date Value Ref Range Status  08/05/2022 50 > OR = 50 mg/dL Final   Triglycerides  Date Value Ref Range Status  08/05/2022 228 (H) <150 mg/dL Final    Comment:    . If a non-fasting specimen was collected, consider repeat triglyceride testing on a fasting specimen if clinically indicated.  Perry Mount et al. J. of Clin. Lipidol. 2015;9:129-169. Marland Kitchen          Passed - Patient is not pregnant

## 2022-11-09 ENCOUNTER — Other Ambulatory Visit: Payer: Self-pay | Admitting: Family Medicine

## 2022-11-09 DIAGNOSIS — K219 Gastro-esophageal reflux disease without esophagitis: Secondary | ICD-10-CM

## 2022-11-09 NOTE — Telephone Encounter (Signed)
Requested Prescriptions  Pending Prescriptions Disp Refills   venlafaxine XR (EFFEXOR-XR) 75 MG 24 hr capsule [Pharmacy Med Name: VENLAFAXINE HYDROCHLORIDE ER 75MG  ER CAPSULE ER 24HR] 30 capsule 3    Sig: TAKE ONE CAPSULE BY MOUTH ONCE DAILY WITH BREAKFAST     Psychiatry: Antidepressants - SNRI - desvenlafaxine & venlafaxine Failed - 11/09/2022  3:16 PM      Failed - Cr in normal range and within 360 days    Creat  Date Value Ref Range Status  08/05/2022 1.11 (H) 0.60 - 1.00 mg/dL Final   Creatinine, Urine  Date Value Ref Range Status  08/05/2022 58 20 - 275 mg/dL Final         Failed - Valid encounter within last 6 months    Recent Outpatient Visits           1 year ago Type 2 diabetes mellitus with hyperlipidemia (HCC)   [facility] Family Medicine Pickard, Priscille Heidelberg, MD   2 years ago Bibasilar crackles   [facility] Medicine Donita Brooks, MD   2 years ago Type 2 diabetes mellitus with hyperlipidemia (HCC)   [facility] Family Medicine Donita Brooks, MD   2 years ago Type 2 diabetes mellitus with hyperlipidemia (HCC)   [facility] Pickard, Priscille Heidelberg, MD   4 years ago Carpal tunnel syndrome of right wrist   [facility] Family Medicine Pickard, Priscille Heidelberg, MD              Failed - Lipid Panel in normal range within the last 12 months    Cholesterol  Date Value Ref Range Status  08/05/2022 142 <200 mg/dL Final   LDL Cholesterol (Calc)  Date Value Ref Range Status  08/05/2022 64 mg/dL (calc) Final    Comment:    Reference range: <100 . Desirable range <100 mg/dL for primary prevention;   <70 mg/dL for patients with CHD or diabetic patients  with > or = 2 CHD risk factors. Marland Kitchen LDL-C is now calculated using the Martin-Hopkins  calculation, which is a validated novel method providing  better accuracy than the Friedewald equation in the  estimation of LDL-C.  Horald Pollen et al. Lenox Ahr. 1610;960(45): 2061-2068   (http://education.[facility].com/faq/FAQ164)    Direct LDL  Date Value Ref Range Status  11/22/2019 62 <100 mg/dL Final    Comment:    Greatly elevated Triglycerides values (>1200 mg/dL) interfere with the dLDL assay. As no Triglycerides  testing was ordered, interpret results with caution. . Desirable range <100 mg/dL for primary prevention;   <70 mg/dL for patients with CHD or diabetic patients  with > or = 2 CHD risk factors. Marland Kitchen    HDL  Date Value Ref Range Status  08/05/2022 50 > OR = 50 mg/dL Final   Triglycerides  Date Value Ref Range Status  08/05/2022 228 (H) <150 mg/dL Final    Comment:    . If a non-fasting specimen was collected, consider repeat triglyceride testing on a fasting specimen if clinically indicated.  Perry Mount et al. J. of Clin. Lipidol. 2015;9:129-169. Marland Kitchen          Passed - Last BP in normal range    BP Readings from Last 1 Encounters:  08/05/22 126/78          PARoxetine (PAXIL) 20 MG tablet [Pharmacy Med Name: PAROXETINE HYDROCHLORIDE 20MG  TABLET] 90 tablet 0    Sig: TAKE ONE TABLET BY MOUTH ONCE A DAY     Psychiatry:  Antidepressants - SSRI Failed - 11/09/2022  3:16 PM      Failed - Valid encounter within last 6 months    Recent Outpatient Visits           1 year ago Type 2 diabetes mellitus with hyperlipidemia (HCC)   [facility] Pickard, Priscille Heidelberg, MD   2 years ago Bibasilar crackles   [facility] Donita Brooks, MD   2 years ago Type 2 diabetes mellitus with hyperlipidemia (HCC)   [facility] Medicine Donita Brooks, MD   2 years ago Type 2 diabetes mellitus with hyperlipidemia (HCC)   [facility] Medicine Pickard, Priscille Heidelberg, MD   4 years ago Carpal tunnel syndrome of right wrist   [facility] Pickard, Priscille Heidelberg, MD               pantoprazole (PROTONIX) 40 MG tablet [Pharmacy Med Name: PANTOPRAZOLE SODIUM 40MG  TABLET DR] 30 tablet 1    Sig: TAKE  ONE TABLET BY MOUTH EVERY MORNING     Gastroenterology: Proton Pump Inhibitors Failed - 11/09/2022  3:16 PM      Failed - Valid encounter within last 12 months    Recent Outpatient Visits           1 year ago Type 2 diabetes mellitus with hyperlipidemia (HCC)   [facility] Medicine Pickard, Priscille Heidelberg, MD   2 years ago Bibasilar crackles   [facility] Medicine Donita Brooks, MD   2 years ago Type 2 diabetes mellitus with hyperlipidemia (HCC)   [facility] Medicine Donita Brooks, MD   2 years ago Type 2 diabetes mellitus with hyperlipidemia (HCC)   [facility] Medicine Pickard, Priscille Heidelberg, MD   4 years ago Carpal tunnel syndrome of right wrist   [facility] Family Medicine Pickard, Priscille Heidelberg, MD

## 2022-12-13 ENCOUNTER — Other Ambulatory Visit: Payer: Self-pay | Admitting: Family Medicine

## 2022-12-13 DIAGNOSIS — I10 Essential (primary) hypertension: Secondary | ICD-10-CM

## 2022-12-26 ENCOUNTER — Encounter (HOSPITAL_COMMUNITY): Payer: Self-pay

## 2022-12-26 ENCOUNTER — Other Ambulatory Visit: Payer: Self-pay

## 2022-12-26 ENCOUNTER — Inpatient Hospital Stay (HOSPITAL_COMMUNITY)
Admission: EM | Admit: 2022-12-26 | Discharge: 2022-12-30 | DRG: 392 | Disposition: A | Discharge status: Skilled Nursing Facility | Payer: Medicare Other | Attending: Internal Medicine | Admitting: Internal Medicine

## 2022-12-26 DIAGNOSIS — Z7984 Long term (current) use of oral hypoglycemic drugs: Secondary | ICD-10-CM

## 2022-12-26 DIAGNOSIS — Z8249 Family history of ischemic heart disease and other diseases of the circulatory system: Secondary | ICD-10-CM

## 2022-12-26 DIAGNOSIS — E1122 Type 2 diabetes mellitus with diabetic chronic kidney disease: Secondary | ICD-10-CM | POA: Diagnosis present

## 2022-12-26 DIAGNOSIS — K529 Noninfective gastroenteritis and colitis, unspecified: Secondary | ICD-10-CM

## 2022-12-26 DIAGNOSIS — N1831 Chronic kidney disease, stage 3a: Secondary | ICD-10-CM | POA: Diagnosis present

## 2022-12-26 DIAGNOSIS — Z800 Family history of malignant neoplasm of digestive organs: Secondary | ICD-10-CM

## 2022-12-26 DIAGNOSIS — A084 Viral intestinal infection, unspecified: Principal | ICD-10-CM | POA: Diagnosis present

## 2022-12-26 DIAGNOSIS — R8271 Bacteriuria: Secondary | ICD-10-CM | POA: Diagnosis present

## 2022-12-26 DIAGNOSIS — E876 Hypokalemia: Secondary | ICD-10-CM | POA: Diagnosis present

## 2022-12-26 DIAGNOSIS — E119 Type 2 diabetes mellitus without complications: Secondary | ICD-10-CM

## 2022-12-26 DIAGNOSIS — Z7901 Long term (current) use of anticoagulants: Secondary | ICD-10-CM

## 2022-12-26 DIAGNOSIS — F32A Depression, unspecified: Secondary | ICD-10-CM | POA: Diagnosis present

## 2022-12-26 DIAGNOSIS — E872 Acidosis, unspecified: Secondary | ICD-10-CM | POA: Diagnosis not present

## 2022-12-26 DIAGNOSIS — K219 Gastro-esophageal reflux disease without esophagitis: Secondary | ICD-10-CM | POA: Diagnosis present

## 2022-12-26 DIAGNOSIS — E7800 Pure hypercholesterolemia, unspecified: Secondary | ICD-10-CM | POA: Diagnosis present

## 2022-12-26 DIAGNOSIS — Z885 Allergy status to narcotic agent status: Secondary | ICD-10-CM

## 2022-12-26 DIAGNOSIS — M6282 Rhabdomyolysis: Secondary | ICD-10-CM | POA: Diagnosis present

## 2022-12-26 DIAGNOSIS — Z888 Allergy status to other drugs, medicaments and biological substances status: Secondary | ICD-10-CM

## 2022-12-26 DIAGNOSIS — Z79899 Other long term (current) drug therapy: Secondary | ICD-10-CM

## 2022-12-26 DIAGNOSIS — E860 Dehydration: Secondary | ICD-10-CM | POA: Diagnosis present

## 2022-12-26 DIAGNOSIS — I480 Paroxysmal atrial fibrillation: Secondary | ICD-10-CM | POA: Diagnosis present

## 2022-12-26 DIAGNOSIS — R197 Diarrhea, unspecified: Secondary | ICD-10-CM

## 2022-12-26 DIAGNOSIS — I10 Essential (primary) hypertension: Secondary | ICD-10-CM | POA: Diagnosis present

## 2022-12-26 DIAGNOSIS — F0390 Unspecified dementia without behavioral disturbance: Secondary | ICD-10-CM | POA: Diagnosis present

## 2022-12-26 DIAGNOSIS — R1110 Vomiting, unspecified: Secondary | ICD-10-CM

## 2022-12-26 DIAGNOSIS — R7401 Elevation of levels of liver transaminase levels: Secondary | ICD-10-CM | POA: Diagnosis present

## 2022-12-26 DIAGNOSIS — I129 Hypertensive chronic kidney disease with stage 1 through stage 4 chronic kidney disease, or unspecified chronic kidney disease: Secondary | ICD-10-CM | POA: Diagnosis present

## 2022-12-26 DIAGNOSIS — F419 Anxiety disorder, unspecified: Secondary | ICD-10-CM | POA: Diagnosis present

## 2022-12-26 DIAGNOSIS — Z853 Personal history of malignant neoplasm of breast: Secondary | ICD-10-CM

## 2022-12-26 DIAGNOSIS — E8729 Other acidosis: Principal | ICD-10-CM

## 2022-12-26 DIAGNOSIS — E1169 Type 2 diabetes mellitus with other specified complication: Secondary | ICD-10-CM

## 2022-12-26 DIAGNOSIS — R17 Unspecified jaundice: Secondary | ICD-10-CM | POA: Diagnosis present

## 2022-12-26 DIAGNOSIS — Z923 Personal history of irradiation: Secondary | ICD-10-CM

## 2022-12-26 DIAGNOSIS — Z881 Allergy status to other antibiotic agents status: Secondary | ICD-10-CM

## 2022-12-26 LAB — COMPREHENSIVE METABOLIC PANEL
ALT: 86 U/L — ABNORMAL HIGH (ref 0–44)
AST: 88 U/L — ABNORMAL HIGH (ref 15–41)
Albumin: 4.2 g/dL (ref 3.5–5.0)
Alkaline Phosphatase: 84 U/L (ref 38–126)
Anion gap: 19 — ABNORMAL HIGH (ref 5–15)
BUN: 17 mg/dL (ref 8–23)
CO2: 13 mmol/L — ABNORMAL LOW (ref 22–32)
Calcium: 9 mg/dL (ref 8.9–10.3)
Chloride: 107 mmol/L (ref 98–111)
Creatinine, Ser: 1.25 mg/dL — ABNORMAL HIGH (ref 0.44–1.00)
GFR, Estimated: 44 mL/min — ABNORMAL LOW (ref >60)
Glucose, Bld: 187 mg/dL — ABNORMAL HIGH (ref 70–99)
Potassium: 4.2 mmol/L (ref 3.5–5.1)
Sodium: 139 mmol/L (ref 135–145)
Total Bilirubin: 2.2 mg/dL — ABNORMAL HIGH (ref 0.3–1.2)
Total Protein: 7.4 g/dL (ref 6.5–8.1)

## 2022-12-26 LAB — CBC WITH DIFFERENTIAL/PLATELET
HCT: 42.7 % (ref 36.0–46.0)
MCH: 30.2 pg (ref 26.0–34.0)
MCV: 89.5 fL (ref 80.0–100.0)
Platelets: 312 10*3/uL (ref 150–400)
RBC: 4.77 MIL/uL (ref 3.87–5.11)
WBC: 22 10*3/uL — ABNORMAL HIGH (ref 4.0–10.5)

## 2022-12-26 LAB — CBG MONITORING, ED: Glucose-Capillary: 163 mg/dL — ABNORMAL HIGH (ref 70–99)

## 2022-12-26 LAB — LIPASE, BLOOD: Lipase: 61 U/L — ABNORMAL HIGH (ref 11–51)

## 2022-12-26 MED ORDER — SODIUM CHLORIDE 0.9 % IV BOLUS
500.0000 mL | Freq: Once | INTRAVENOUS | Status: AC
  Administered 2022-12-26: 500 mL via INTRAVENOUS

## 2022-12-26 MED ORDER — ONDANSETRON HCL 4 MG/2ML IJ SOLN
4.0000 mg | Freq: Once | INTRAMUSCULAR | Status: AC
  Administered 2022-12-26: 4 mg via INTRAVENOUS
  Filled 2022-12-26: qty 2

## 2022-12-26 MED ORDER — FAMOTIDINE IN NACL 20-0.9 MG/50ML-% IV SOLN
20.0000 mg | Freq: Once | INTRAVENOUS | Status: AC
  Administered 2022-12-26: 20 mg via INTRAVENOUS
  Filled 2022-12-26: qty 50

## 2022-12-26 MED ORDER — FENTANYL CITRATE PF 50 MCG/ML IJ SOSY
50.0000 ug | PREFILLED_SYRINGE | Freq: Once | INTRAMUSCULAR | Status: AC
  Administered 2022-12-26: 50 ug via INTRAVENOUS
  Filled 2022-12-26: qty 1

## 2022-12-26 NOTE — ED Notes (Signed)
Pt called out stating she urinated on bed/floor.  Pt has called out numerous times prior requesting water; informed by staff that she cannot have anything with each call.  Pt informed by staff at this time that she needs to use call bell if she has to urinate.  Pt states she did not know this, despite using call bell numerous times prior.

## 2022-12-26 NOTE — ED Notes (Signed)
Patient refuses to wear monitoring equipment as we will not give her food and drink. Patient urinates and poops on herself and floor. Was cleaned by tech staff and clean linens and gown provided

## 2022-12-26 NOTE — ED Notes (Signed)
Patient requesting pain meds and water was advised she is here for vomiting and needs to wait to see doctor before meds and fluids can be given. Patient becomes angry and verbally abusive

## 2022-12-26 NOTE — ED Provider Notes (Signed)
[place] EMERGENCY DEPARTMENT AT [facility] Provider Note   CSN: [id number] Arrival date & time: 12/26/22  2158     History {Add pertinent medical, surgical, social history, OB history to HPI:1} Chief Complaint  Patient presents with   Emesis    Patient arrived via ems from home stating she has been vomiting since after dinner last night. Hx of DM EMS vitals 180/110 94 98% ra 20 CBG 242 given 4mg  iv Zofran and 500NS bolus enroute    Kathryn Wall is a 77 y.o. female.  77 year old female with a history of diabetes, hyperlipidemia, hypertension, paroxysmal A-fib (on chronic Eliquis), CKD presents to the emergency department for evaluation of nausea, vomiting, abdominal pain.  She reports that her symptoms began acutely on Saturday evening and have remained constant.  Her pain is periumbilical and nonradiating.  She has had multiple episodes of vomiting as well as diarrhea.  Denies any questionable food ingestions or associated fever.  She has an abdominal surgical history significant for cholecystectomy, appendectomy, partial hysterectomy.  Per chart review, it appears that she was seen for similar complaints at outside hospital in February.  Her abdominal CT at that time was generally reassuring.  The history is provided by the patient and the EMS personnel. No language interpreter was used.  Emesis      Home Medications Prior to Admission medications   Medication Sig Start Date End Date Taking? Authorizing Provider  acetaminophen (TYLENOL) 325 MG tablet Take 2 tablets (650 mg total) by mouth every 6 (six) hours as needed for mild pain (or Fever >/= 101). 01/05/21   Rhetta Mura, MD  amLODipine (NORVASC) 5 MG tablet TAKE 1 TABLET BY MOUTH ONCE A DAY 04/23/22   Donita Brooks, MD  atorvastatin (LIPITOR) 40 MG tablet TAKE ONE TABLET BY MOUTH ONCE A DAY 11/02/22   Donita Brooks, MD  calcium carbonate (OS-CAL - DOSED IN MG OF ELEMENTAL CALCIUM) 1250 (500 Ca) MG  tablet Take 1 tablet (1,250 mg total) by mouth 2 (two) times daily with a meal. 03/27/22   Lonia Blood, MD  cyanocobalamin (VITAMIN B12) 1000 MCG tablet Take 1 tablet (1,000 mcg total) by mouth daily. 03/27/22   Lonia Blood, MD  ELIQUIS 5 MG TABS tablet TAKE 1 TABLET BY MOUTH TWICE A DAY 04/23/22   Donita Brooks, MD  JARDIANCE 25 MG TABS tablet TAKE 1 TABLET BY MOUTH ONCE A DAY 04/23/22   Donita Brooks, MD  losartan (COZAAR) 50 MG tablet TAKE ONE TABLET BY MOUTH ONCE A DAY 11/02/22   Donita Brooks, MD  metFORMIN (GLUCOPHAGE-XR) 500 MG 24 hr tablet TAKE 4 TABLETS BY MOUTH ONCE DAILY WITH BREAKFAST. 04/23/22   Donita Brooks, MD  metoprolol succinate (TOPROL-XL) 25 MG 24 hr tablet TAKE ONE TABLET BY MOUTH DAILY 12/13/22   Donita Brooks, MD  MYRBETRIQ 25 MG TB24 tablet TAKE ONE TABLET BY MOUTH DAILY 09/13/22   Donita Brooks, MD  pantoprazole (PROTONIX) 40 MG tablet TAKE ONE TABLET BY MOUTH EVERY MORNING 11/09/22   Donita Brooks, MD  PARoxetine (PAXIL) 20 MG tablet TAKE ONE TABLET BY MOUTH ONCE A DAY 11/09/22   Donita Brooks, MD  QUEtiapine (SEROQUEL) 50 MG tablet TAKE ONE TABLET BY MOUTH EVERY NIGHT AT BEDTIME 09/13/22   Donita Brooks, MD  venlafaxine XR (EFFEXOR-XR) 75 MG 24 hr capsule TAKE ONE CAPSULE BY MOUTH ONCE DAILY WITH BREAKFAST 11/09/22   Donita Brooks,  MD      Allergies    Ambien [zolpidem tartrate], Cymbalta [duloxetine hcl], Other, Ciprofloxacin, Doxazosin, and Percocet [oxycodone-acetaminophen]    Review of Systems   Review of Systems  Gastrointestinal:  Positive for vomiting.  Ten systems reviewed and are negative for acute change, except as noted in the HPI.    Physical Exam Updated Vital Signs BP (!) 173/98   Pulse 81   Temp 98.1 F (36.7 C)   Resp 19   Ht 5\' 5"  (1.651 m)   Wt 75 kg   SpO2 99%   BMI 27.51 kg/m   Physical Exam Vitals and nursing note reviewed.  Constitutional:      General: She is not in acute distress.     Appearance: She is well-developed. She is not diaphoretic.     Comments: Chronically ill appearing. Erythema to facial skin with the appearance of a sunburn.   HENT:     Head: Normocephalic and atraumatic.  Eyes:     General: No scleral icterus.    Conjunctiva/sclera: Conjunctivae normal.  Cardiovascular:     Rate and Rhythm: Normal rate and regular rhythm.     Pulses: Normal pulses.  Pulmonary:     Effort: Pulmonary effort is normal. No respiratory distress.     Breath sounds: No stridor. No wheezing.     Comments: Respirations even and unlabored Abdominal:     Comments: Soft, obese abdomen. No guarding on palpation. No focal tenderness appreciated on exam. No peritoneal signs.  Musculoskeletal:        General: Normal range of motion.     Cervical back: Normal range of motion.  Skin:    General: Skin is warm and dry.     Coloration: Skin is not pale.     Findings: No erythema or rash.  Neurological:     Mental Status: She is alert and oriented to person, place, and time.     Coordination: Coordination normal.  Psychiatric:        Behavior: Behavior normal.     ED Results / Procedures / Treatments   Labs (all labs ordered are listed, but only abnormal results are displayed) Labs Reviewed  GASTROINTESTINAL PANEL BY PCR, STOOL (REPLACES STOOL CULTURE)  C DIFFICILE QUICK SCREEN W PCR REFLEX    CBC WITH DIFFERENTIAL/PLATELET  LIPASE, BLOOD  COMPREHENSIVE METABOLIC PANEL  URINALYSIS, ROUTINE W REFLEX MICROSCOPIC  LACTIC ACID, PLASMA  CBG MONITORING, ED    EKG None  Radiology No results found.  Procedures Procedures  {Document cardiac monitor, telemetry assessment procedure when appropriate:1}  Medications Ordered in ED Medications  famotidine (PEPCID) IVPB 20 mg premix (has no administration in time range)  fentaNYL (SUBLIMAZE) injection 50 mcg (has no administration in time range)  ondansetron (ZOFRAN) injection 4 mg (has no administration in time range)   sodium chloride 0.9 % bolus 500 mL (500 mLs Intravenous New Bag/Given 12/26/22 2307)    ED Course/ Medical Decision Making/ A&P   {   Click here for ABCD2, HEART and other calculatorsREFRESH Note before signing :1}                          Medical Decision Making Amount and/or Complexity of Data Reviewed Labs: ordered. Radiology: ordered.  Risk Prescription drug management.   ***  {Document critical care time when appropriate:1} {Document review of labs and clinical decision tools ie heart score, Chads2Vasc2 etc:1}  {Document your independent review of radiology images, and  any outside records:1} {Document your discussion with family members, caretakers, and with consultants:1} {Document social determinants of health affecting pt's care:1} {Document your decision making why or why not admission, treatments were needed:1} Final Clinical Impression(s) / ED Diagnoses Final diagnoses:  None    Rx / DC Orders ED Discharge Orders     None

## 2022-12-26 NOTE — ED Notes (Signed)
Assumed care of patient who arrived via ems from home c/o vomiting that began last night after dinner. Patient  rolling around in bed unable to lay still . Patient is diabetic was given 4 mg iv zofran and NS bouls on route to ER. Patient has dried stool to both feet is dirty and unkept . Patient a/o x 4 respirations even and non labored bowel sounds hyperactive in all quads CBG 242.

## 2022-12-27 ENCOUNTER — Observation Stay (HOSPITAL_COMMUNITY): Payer: Medicare Other

## 2022-12-27 ENCOUNTER — Emergency Department (HOSPITAL_COMMUNITY): Payer: Medicare Other

## 2022-12-27 DIAGNOSIS — E8729 Other acidosis: Secondary | ICD-10-CM | POA: Diagnosis not present

## 2022-12-27 DIAGNOSIS — R7401 Elevation of levels of liver transaminase levels: Secondary | ICD-10-CM | POA: Diagnosis present

## 2022-12-27 DIAGNOSIS — I10 Essential (primary) hypertension: Secondary | ICD-10-CM | POA: Diagnosis not present

## 2022-12-27 DIAGNOSIS — E1122 Type 2 diabetes mellitus with diabetic chronic kidney disease: Secondary | ICD-10-CM | POA: Diagnosis present

## 2022-12-27 DIAGNOSIS — Z7901 Long term (current) use of anticoagulants: Secondary | ICD-10-CM | POA: Diagnosis not present

## 2022-12-27 DIAGNOSIS — K529 Noninfective gastroenteritis and colitis, unspecified: Secondary | ICD-10-CM | POA: Diagnosis not present

## 2022-12-27 DIAGNOSIS — E860 Dehydration: Secondary | ICD-10-CM | POA: Diagnosis present

## 2022-12-27 DIAGNOSIS — E876 Hypokalemia: Secondary | ICD-10-CM | POA: Diagnosis present

## 2022-12-27 DIAGNOSIS — Z853 Personal history of malignant neoplasm of breast: Secondary | ICD-10-CM | POA: Diagnosis not present

## 2022-12-27 DIAGNOSIS — R8271 Bacteriuria: Secondary | ICD-10-CM | POA: Diagnosis present

## 2022-12-27 DIAGNOSIS — Z923 Personal history of irradiation: Secondary | ICD-10-CM | POA: Diagnosis not present

## 2022-12-27 DIAGNOSIS — I129 Hypertensive chronic kidney disease with stage 1 through stage 4 chronic kidney disease, or unspecified chronic kidney disease: Secondary | ICD-10-CM | POA: Diagnosis present

## 2022-12-27 DIAGNOSIS — I480 Paroxysmal atrial fibrillation: Secondary | ICD-10-CM | POA: Diagnosis present

## 2022-12-27 DIAGNOSIS — F0390 Unspecified dementia without behavioral disturbance: Secondary | ICD-10-CM | POA: Diagnosis present

## 2022-12-27 DIAGNOSIS — M6282 Rhabdomyolysis: Secondary | ICD-10-CM

## 2022-12-27 DIAGNOSIS — R17 Unspecified jaundice: Secondary | ICD-10-CM | POA: Diagnosis present

## 2022-12-27 DIAGNOSIS — R1110 Vomiting, unspecified: Secondary | ICD-10-CM

## 2022-12-27 DIAGNOSIS — Z885 Allergy status to narcotic agent status: Secondary | ICD-10-CM | POA: Diagnosis not present

## 2022-12-27 DIAGNOSIS — E119 Type 2 diabetes mellitus without complications: Secondary | ICD-10-CM

## 2022-12-27 DIAGNOSIS — E872 Acidosis, unspecified: Secondary | ICD-10-CM | POA: Diagnosis not present

## 2022-12-27 DIAGNOSIS — Z881 Allergy status to other antibiotic agents status: Secondary | ICD-10-CM | POA: Diagnosis not present

## 2022-12-27 DIAGNOSIS — N1831 Chronic kidney disease, stage 3a: Secondary | ICD-10-CM | POA: Diagnosis present

## 2022-12-27 DIAGNOSIS — Z888 Allergy status to other drugs, medicaments and biological substances status: Secondary | ICD-10-CM | POA: Diagnosis not present

## 2022-12-27 DIAGNOSIS — F419 Anxiety disorder, unspecified: Secondary | ICD-10-CM | POA: Diagnosis present

## 2022-12-27 DIAGNOSIS — F32A Depression, unspecified: Secondary | ICD-10-CM | POA: Diagnosis present

## 2022-12-27 DIAGNOSIS — A084 Viral intestinal infection, unspecified: Secondary | ICD-10-CM | POA: Diagnosis present

## 2022-12-27 DIAGNOSIS — K219 Gastro-esophageal reflux disease without esophagitis: Secondary | ICD-10-CM | POA: Diagnosis present

## 2022-12-27 DIAGNOSIS — Z79899 Other long term (current) drug therapy: Secondary | ICD-10-CM | POA: Diagnosis not present

## 2022-12-27 DIAGNOSIS — E7800 Pure hypercholesterolemia, unspecified: Secondary | ICD-10-CM | POA: Diagnosis present

## 2022-12-27 LAB — COMPREHENSIVE METABOLIC PANEL
ALT: 85 U/L — ABNORMAL HIGH (ref 0–44)
AST: 116 U/L — ABNORMAL HIGH (ref 15–41)
Albumin: 3.8 g/dL (ref 3.5–5.0)
Alkaline Phosphatase: 77 U/L (ref 38–126)
Anion gap: 18 — ABNORMAL HIGH (ref 5–15)
BUN: 15 mg/dL (ref 8–23)
CO2: 15 mmol/L — ABNORMAL LOW (ref 22–32)
Calcium: 8.6 mg/dL — ABNORMAL LOW (ref 8.9–10.3)
Chloride: 107 mmol/L (ref 98–111)
Creatinine, Ser: 1.05 mg/dL — ABNORMAL HIGH (ref 0.44–1.00)
GFR, Estimated: 54 mL/min — ABNORMAL LOW (ref >60)
Glucose, Bld: 168 mg/dL — ABNORMAL HIGH (ref 70–99)
Potassium: 3.9 mmol/L (ref 3.5–5.1)
Sodium: 140 mmol/L (ref 135–145)
Total Bilirubin: 2 mg/dL — ABNORMAL HIGH (ref 0.3–1.2)
Total Protein: 6.6 g/dL (ref 6.5–8.1)

## 2022-12-27 LAB — CBC WITH DIFFERENTIAL/PLATELET
Abs Immature Granulocytes: 0.08 10*3/uL — ABNORMAL HIGH (ref 0.00–0.07)
Basophils Absolute: 0 10*3/uL (ref 0.0–0.1)
Basophils Relative: 0 %
Basophils Relative: 0 %
Eosinophils Absolute: 0 10*3/uL (ref 0.0–0.5)
Eosinophils Absolute: 0 10*3/uL (ref 0.0–0.5)
Eosinophils Relative: 0 %
HCT: 39.3 % (ref 36.0–46.0)
Hemoglobin: 14.4 g/dL (ref 12.0–15.0)
Hemoglobin: 12.9 g/dL (ref 12.0–15.0)
Immature Granulocytes: 1 %
Lymphocytes Relative: 9 %
Lymphocytes Relative: 9 %
Lymphs Abs: 1.6 10*3/uL (ref 0.7–4.0)
MCH: 29.9 pg (ref 26.0–34.0)
MCHC: 33.7 g/dL (ref 30.0–36.0)
MCHC: 32.8 g/dL (ref 30.0–36.0)
MCV: 91.2 fL (ref 80.0–100.0)
Monocytes Absolute: 0.9 10*3/uL (ref 0.1–1.0)
Monocytes Absolute: 1 10*3/uL (ref 0.1–1.0)
Monocytes Relative: 4 %
Monocytes Relative: 6 %
Neutro Abs: 19.1 10*3/uL — ABNORMAL HIGH (ref 1.7–7.7)
Neutro Abs: 14.4 10*3/uL — ABNORMAL HIGH (ref 1.7–7.7)
Neutrophils Relative %: 84 %
Platelets: 253 10*3/uL (ref 150–400)
RBC: 4.31 MIL/uL (ref 3.87–5.11)
RDW: 14.1 % (ref 11.5–15.5)
RDW: 14.1 % (ref 11.5–15.5)
WBC: 17.1 10*3/uL — ABNORMAL HIGH (ref 4.0–10.5)
nRBC: 0 % (ref 0.0–0.2)
nRBC: 0 /100 WBC
nRBC: 0 % (ref 0.0–0.2)

## 2022-12-27 LAB — GLUCOSE, CAPILLARY
Glucose-Capillary: 138 mg/dL — ABNORMAL HIGH (ref 70–99)
Glucose-Capillary: 122 mg/dL — ABNORMAL HIGH (ref 70–99)
Glucose-Capillary: 127 mg/dL — ABNORMAL HIGH (ref 70–99)
Glucose-Capillary: 120 mg/dL — ABNORMAL HIGH (ref 70–99)

## 2022-12-27 LAB — CK: Total CK: 3672 U/L — ABNORMAL HIGH (ref 38–234)

## 2022-12-27 LAB — URINALYSIS, ROUTINE W REFLEX MICROSCOPIC
Bilirubin Urine: NEGATIVE
Glucose, UA: >=500 mg/dL — AB
Ketones, ur: 20 mg/dL — AB
Nitrite: NEGATIVE
Protein, ur: 30 mg/dL — AB
Specific Gravity, Urine: 1.023 (ref 1.005–1.030)
pH: 6 (ref 5.0–8.0)

## 2022-12-27 LAB — RAPID URINE DRUG SCREEN, HOSP PERFORMED
Amphetamines: NOT DETECTED
Barbiturates: NOT DETECTED
Benzodiazepines: NOT DETECTED
Cocaine: NOT DETECTED
Opiates: NOT DETECTED
Tetrahydrocannabinol: POSITIVE — AB

## 2022-12-27 LAB — HEPATITIS PANEL, ACUTE
HCV Ab: NONREACTIVE
Hep A IgM: NONREACTIVE
Hep B C IgM: NONREACTIVE
Hepatitis B Surface Ag: NONREACTIVE

## 2022-12-27 LAB — LACTIC ACID, PLASMA
Lactic Acid, Venous: 4.9 mmol/L (ref 0.5–1.9)
Lactic Acid, Venous: 3.5 mmol/L (ref 0.5–1.9)
Lactic Acid, Venous: 1.4 mmol/L (ref 0.5–1.9)

## 2022-12-27 LAB — PROTIME-INR
INR: 1.4 — ABNORMAL HIGH (ref 0.8–1.2)
Prothrombin Time: 17 seconds — ABNORMAL HIGH (ref 11.4–15.2)

## 2022-12-27 LAB — ETHANOL: Alcohol, Ethyl (B): <10 mg/dL (ref <10)

## 2022-12-27 LAB — SEDIMENTATION RATE: Sed Rate: 5 mm/hr (ref 0–22)

## 2022-12-27 LAB — APTT: aPTT: 24 seconds (ref 24–36)

## 2022-12-27 LAB — C-REACTIVE PROTEIN: CRP: <0.5 mg/dL (ref <1.0)

## 2022-12-27 MED ORDER — HYDROMORPHONE HCL 1 MG/ML IJ SOLN
1.0000 mg | INTRAMUSCULAR | Status: DC | PRN
  Administered 2022-12-27 – 2022-12-30 (×3): 1 mg via INTRAVENOUS
  Filled 2022-12-27 (×3): qty 1

## 2022-12-27 MED ORDER — ONDANSETRON HCL 4 MG/2ML IJ SOLN
4.0000 mg | Freq: Four times a day (QID) | INTRAMUSCULAR | Status: DC | PRN
  Administered 2022-12-27: 4 mg via INTRAVENOUS
  Filled 2022-12-27 (×2): qty 2

## 2022-12-27 MED ORDER — VANCOMYCIN HCL 1500 MG/300ML IV SOLN
1500.0000 mg | Freq: Once | INTRAVENOUS | Status: AC
  Administered 2022-12-27: 1500 mg via INTRAVENOUS
  Filled 2022-12-27: qty 300

## 2022-12-27 MED ORDER — ONDANSETRON HCL 4 MG PO TABS: 4.0000 mg | ORAL_TABLET | Freq: Four times a day (QID) | ORAL | Status: DC | PRN

## 2022-12-27 MED ORDER — AMLODIPINE BESYLATE 5 MG PO TABS
5.0000 mg | ORAL_TABLET | Freq: Every day | ORAL | Status: DC
  Administered 2022-12-27 – 2022-12-30 (×4): 5 mg via ORAL
  Filled 2022-12-27 (×4): qty 1

## 2022-12-27 MED ORDER — SODIUM CHLORIDE 0.9 % IV SOLN: INTRAVENOUS | Status: DC

## 2022-12-27 MED ORDER — SODIUM CHLORIDE 0.9 % IV SOLN
2.0000 g | Freq: Two times a day (BID) | INTRAVENOUS | Status: DC
  Administered 2022-12-27: 2 g via INTRAVENOUS
  Filled 2022-12-27: qty 12.5

## 2022-12-27 MED ORDER — LOSARTAN POTASSIUM 50 MG PO TABS
50.0000 mg | ORAL_TABLET | Freq: Every day | ORAL | Status: DC
  Administered 2022-12-27 – 2022-12-30 (×4): 50 mg via ORAL
  Filled 2022-12-27 (×4): qty 1

## 2022-12-27 MED ORDER — VANCOMYCIN HCL IN DEXTROSE 1-5 GM/200ML-% IV SOLN: 1000.0000 mg | INTRAVENOUS | Status: DC

## 2022-12-27 MED ORDER — SODIUM CHLORIDE 0.9 % IV BOLUS
500.0000 mL | Freq: Once | INTRAVENOUS | Status: AC
  Administered 2022-12-27: 500 mL via INTRAVENOUS

## 2022-12-27 MED ORDER — QUETIAPINE FUMARATE 25 MG PO TABS
25.0000 mg | ORAL_TABLET | Freq: Every day | ORAL | Status: DC
  Administered 2022-12-27 – 2022-12-29 (×3): 25 mg via ORAL
  Filled 2022-12-27 (×3): qty 1

## 2022-12-27 MED ORDER — VITAMIN B-12 1000 MCG PO TABS
1000.0000 ug | ORAL_TABLET | Freq: Every day | ORAL | Status: DC
  Administered 2022-12-27 – 2022-12-30 (×4): 1000 ug via ORAL
  Filled 2022-12-27 (×4): qty 1

## 2022-12-27 MED ORDER — VENLAFAXINE HCL ER 75 MG PO CP24
75.0000 mg | ORAL_CAPSULE | Freq: Every day | ORAL | Status: DC
  Administered 2022-12-27 – 2022-12-30 (×4): 75 mg via ORAL
  Filled 2022-12-27 (×4): qty 1

## 2022-12-27 MED ORDER — INSULIN ASPART 100 UNIT/ML IJ SOLN
0.0000 [IU] | INTRAMUSCULAR | Status: DC
  Administered 2022-12-27: 1 [IU] via SUBCUTANEOUS

## 2022-12-27 MED ORDER — PAROXETINE HCL 20 MG PO TABS
20.0000 mg | ORAL_TABLET | Freq: Every day | ORAL | Status: DC
  Administered 2022-12-27 – 2022-12-30 (×4): 20 mg via ORAL
  Filled 2022-12-27 (×4): qty 1

## 2022-12-27 MED ORDER — INSULIN ASPART 100 UNIT/ML IJ SOLN
0.0000 [IU] | Freq: Three times a day (TID) | INTRAMUSCULAR | Status: DC
  Administered 2022-12-27 – 2022-12-29 (×3): 1 [IU] via SUBCUTANEOUS
  Administered 2022-12-29: 2 [IU] via SUBCUTANEOUS

## 2022-12-27 MED ORDER — FENTANYL CITRATE PF 50 MCG/ML IJ SOSY
50.0000 ug | PREFILLED_SYRINGE | Freq: Once | INTRAMUSCULAR | Status: AC
  Administered 2022-12-27: 50 ug via INTRAVENOUS
  Filled 2022-12-27: qty 1

## 2022-12-27 MED ORDER — PIPERACILLIN-TAZOBACTAM 3.375 G IVPB
3.3750 g | Freq: Three times a day (TID) | INTRAVENOUS | Status: DC
  Administered 2022-12-27: 3.375 g via INTRAVENOUS
  Filled 2022-12-27: qty 50

## 2022-12-27 MED ORDER — APIXABAN 5 MG PO TABS: 5.0000 mg | ORAL_TABLET | Freq: Two times a day (BID) | ORAL | Status: DC

## 2022-12-27 MED ORDER — HEPARIN SODIUM (PORCINE) 5000 UNIT/ML IJ SOLN
5000.0000 [IU] | Freq: Three times a day (TID) | INTRAMUSCULAR | Status: DC
  Administered 2022-12-27: 5000 [IU] via SUBCUTANEOUS
  Filled 2022-12-27: qty 1

## 2022-12-27 MED ORDER — LORAZEPAM 2 MG/ML IJ SOLN
1.0000 mg | Freq: Once | INTRAMUSCULAR | Status: AC
  Administered 2022-12-27: 1 mg via INTRAVENOUS
  Filled 2022-12-27: qty 1

## 2022-12-27 MED ORDER — LACTATED RINGERS IV BOLUS
1000.0000 mL | Freq: Once | INTRAVENOUS | Status: AC
  Administered 2022-12-27: 1000 mL via INTRAVENOUS

## 2022-12-27 MED ORDER — PANTOPRAZOLE SODIUM 40 MG PO TBEC
40.0000 mg | DELAYED_RELEASE_TABLET | Freq: Every morning | ORAL | Status: DC
  Administered 2022-12-27 – 2022-12-30 (×4): 40 mg via ORAL
  Filled 2022-12-27 (×3): qty 1

## 2022-12-27 MED ORDER — HYDRALAZINE HCL 20 MG/ML IJ SOLN: 5.0000 mg | INTRAMUSCULAR | Status: DC | PRN

## 2022-12-27 MED ORDER — MIRABEGRON ER 25 MG PO TB24
25.0000 mg | ORAL_TABLET | Freq: Every day | ORAL | Status: DC
  Administered 2022-12-27 – 2022-12-30 (×4): 25 mg via ORAL
  Filled 2022-12-27 (×4): qty 1

## 2022-12-27 MED ORDER — IOHEXOL 350 MG/ML SOLN
75.0000 mL | Freq: Once | INTRAVENOUS | Status: AC | PRN
  Administered 2022-12-27: 75 mL via INTRAVENOUS

## 2022-12-27 MED ORDER — APIXABAN 5 MG PO TABS
5.0000 mg | ORAL_TABLET | Freq: Two times a day (BID) | ORAL | Status: DC
  Administered 2022-12-27 – 2022-12-30 (×7): 5 mg via ORAL
  Filled 2022-12-27 (×7): qty 1

## 2022-12-27 MED ORDER — METOPROLOL SUCCINATE ER 25 MG PO TB24
25.0000 mg | ORAL_TABLET | Freq: Every day | ORAL | Status: DC
  Administered 2022-12-27 – 2022-12-30 (×4): 25 mg via ORAL
  Filled 2022-12-27 (×4): qty 1

## 2022-12-27 MED ORDER — METRONIDAZOLE 500 MG/100ML IV SOLN
500.0000 mg | Freq: Two times a day (BID) | INTRAVENOUS | Status: DC
  Administered 2022-12-27: 500 mg via INTRAVENOUS
  Filled 2022-12-27: qty 100

## 2022-12-27 MED ORDER — LORAZEPAM 2 MG/ML IJ SOLN
0.5000 mg | Freq: Four times a day (QID) | INTRAMUSCULAR | Status: DC | PRN
  Administered 2022-12-27 – 2022-12-29 (×5): 0.5 mg via INTRAVENOUS
  Filled 2022-12-27 (×5): qty 1

## 2022-12-27 NOTE — Progress Notes (Signed)
Pharmacy Antibiotic Note  Kathryn Wall is a 77 y.o. female admitted on 12/26/2022 with  intra-abdominal issues .  Pharmacy has been consulted for Zosyn dosing. WBC elevated. Scr 1.05.   Plan: DC Vancomycin, Cefepime, Flaygl Start Zosyn 3.375G IV q8h to be infused over 4 hours   Height: 5\' 5"  (165.1 cm) Weight: 75 kg (165 lb 5.5 oz) IBW/kg (Calculated) : 57  Temp (24hrs), Avg:98.2 F (36.8 C), Min:97.9 F (36.6 C), Max:98.4 F (36.9 C)  Recent Labs  Lab 12/26/22 2308 12/26/22 2323 12/27/22 0205 12/27/22 0412  WBC 22.0*  --   --  17.1*  CREATININE 1.25*  --   --  1.05*  LATICACIDVEN  --  4.9* 3.5*  --     Estimated Creatinine Clearance: 44.8 mL/min (A) (by C-G formula based on SCr of 1.05 mg/dL (H)).    Allergies  Allergen Reactions   Ambien [Zolpidem Tartrate] Other (See Comments)    Can not tolerate, causes sleepwalking   Cymbalta [Duloxetine Hcl] Other (See Comments)    Unknown Reaction   Other     SENSITIVE TO ANTIBIOTICS   Ciprofloxacin Nausea Only    Nausea    Doxazosin Nausea And Vomiting   Percocet [Oxycodone-Acetaminophen] Nausea And Vomiting   Abran Duke, PharmD, BCPS Clinical Pharmacist Phone: [phone]

## 2022-12-27 NOTE — Progress Notes (Signed)
Pharmacy Antibiotic Note  Kathryn Wall is a 77 y.o. female admitted on 12/26/2022 with sepsis.  Pharmacy has been consulted for cefepime and vancomycin dosing.  Plan: Start cefepime 2g every 12 hours.  Give IV Vancomycin 1500 x 1 for loading dose, followed by IV Vancomycin 1000 every 24 hours for eAUC453. Scr 1.25  Flagyl 500 q 12h per MD.  Follow culture data for de-escalation.  Monitor renal function for dose adjustments as indicated.    Height: 5\' 5"  (165.1 cm) Weight: 75 kg (165 lb 5.5 oz) IBW/kg (Calculated) : 57  Temp (24hrs), Avg:98.1 F (36.7 C), Min:98.1 F (36.7 C), Max:98.1 F (36.7 C)  Recent Labs  Lab 12/26/22 2308 12/26/22 2323  WBC 22.0*  --   CREATININE 1.25*  --   LATICACIDVEN  --  4.9*    Estimated Creatinine Clearance: 37.6 mL/min (A) (by C-G formula based on SCr of 1.25 mg/dL (H)).    Allergies  Allergen Reactions   Ambien [Zolpidem Tartrate] Other (See Comments)    Can not tolerate, causes sleepwalking   Cymbalta [Duloxetine Hcl] Other (See Comments)    Unknown Reaction   Other     SENSITIVE TO ANTIBIOTICS   Ciprofloxacin Nausea Only    Nausea    Doxazosin Nausea And Vomiting   Percocet [Oxycodone-Acetaminophen] Nausea And Vomiting   Thank you for allowing pharmacy to be a part of this patient's care.  Estill Batten, PharmD, BCCCP  12/27/2022 12:36 AM

## 2022-12-27 NOTE — ED Notes (Signed)
ED TO INPATIENT HANDOFF REPORT  ED Nurse Name and Phone #: [phone] Kathryn Wall  S Name/Age/Gender Kathryn Wall 77 y.o. female Room/Bed: 030C/030C  Code Status   Code Status: Prior  Home/SNF/Other Home Patient oriented to: self, place, time, and situation Is this baseline? Yes   Triage Complete: Triage complete  Chief Complaint Diarrhea  Triage Note No notes on file   Allergies Allergies  Allergen Reactions   Ambien [Zolpidem Tartrate] Other (See Comments)    Can not tolerate, causes sleepwalking   Cymbalta [Duloxetine Hcl] Other (See Comments)    Unknown Reaction   Other     SENSITIVE TO ANTIBIOTICS   Ciprofloxacin Nausea Only    Nausea    Doxazosin Nausea And Vomiting   Percocet [Oxycodone-Acetaminophen] Nausea And Vomiting    Level of Care/Admitting Diagnosis ED Disposition     ED Disposition  Admit   Condition  --   Comment  The patient appears reasonably stabilized for admission considering the current resources, flow, and capabilities available in the ED at this time, and I doubt any other [facility] requiring further screening and/or treatment in the ED prior to admission is  present.          B Medical/Surgery History Past Medical History:  Diagnosis Date   Anxiety    Breast cancer (HCC) 06/29/1995   AGE 43, BRCA 1 NEGATIVE 2. 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    CKD (chronic kidney disease), stage III (HCC)    Cognitive deficits    mild   Depression    Diabetes mellitus    TYPE II   High cholesterol    Hypertension    PAF (paroxysmal atrial fibrillation) [facility])    Past Surgical History:  Procedure Laterality Date   ABDOMINAL HYSTERECTOMY  06/29/1983   TAH   ANKLE SURGERY Right    APPLICATION OF WOUND VAC Left 01/14/2022   Procedure: APPLICATION OF WOUND VAC;  Surgeon: Kathryn Form, DO;  Location: [facility] OR;  Service: Plastics;  Laterality: Left;   BREAST SURGERY  06/29/1995   RIGHT BREAST LUMPECTOMY    CHOLECYSTECTOMY  06/28/1978   COLONOSCOPY       A IV Location/Drains/Wounds Patient Lines/Drains/Airways Status     Active Line/Drains/Airways     Name Placement date Placement time Site Days   Peripheral IV 12/26/22 18 G Left Antecubital 12/26/22  2234  Antecubital  1            Intake/Output Last 24 hours  Intake/Output Summary (Last 24 hours) at 12/27/2022 0249 Last data filed at 12/27/2022 0205 Gross per 24 hour  Intake 750 ml  Output --  Net 750 ml    Labs/Imaging Results for orders placed or performed during the hospital encounter of 12/26/22 (from the past 48 hour(s))  CBC with Differential     Status: Abnormal   Collection Time: 12/26/22 11:08 PM  Result Value Ref Range   WBC 22.0 (H) 4.0 - 10.5 K/uL   RBC 4.77 3.87 - 5.11 MIL/uL   Hemoglobin 14.4 12.0 - 15.0 g/dL   HCT 45.4 09.8 - 11.9 %   MCV 89.5 80.0 - 100.0 fL   MCH 30.2 26.0 - 34.0 pg   MCHC 33.7 30.0 - 36.0 g/dL   RDW 14.7 82.9 - 56.2 %   Platelets 312 150 - 400 K/uL   nRBC 0.0 0.0 - 0.2 %   Neutrophils Relative % 87 %   Neutro Abs 19.1 (H) 1.7 - 7.7 K/uL  Lymphocytes Relative 9 %   Lymphs Abs 2.0 0.7 - 4.0 K/uL   Monocytes Relative 4 %   Monocytes Absolute 0.9 0.1 - 1.0 K/uL   Eosinophils Relative 0 %   Eosinophils Absolute 0.0 0.0 - 0.5 K/uL   Basophils Relative 0 %   Basophils Absolute 0.0 0.0 - 0.1 K/uL   nRBC 0 0 /100 WBC   Abs Immature Granulocytes 0.00 0.00 - 0.07 K/uL   Polychromasia PRESENT     Comment: Performed at [facility], [address] [place], [place] [address]  Lipase, blood     Status: Abnormal   Collection Time: 12/26/22 11:08 PM  Result Value Ref Range   Lipase 61 (H) 11 - 51 U/L    Comment: Performed at [facility], [address], [place], [place] [address]  Comprehensive metabolic panel     Status: Abnormal   Collection Time: 12/26/22 11:08 PM  Result Value Ref Range   Sodium 139 135 - 145 mmol/L   Potassium 4.2 3.5 - 5.1 mmol/L    Comment:  HEMOLYSIS AT THIS LEVEL MAY AFFECT RESULT   Chloride 107 98 - 111 mmol/L   CO2 13 (L) 22 - 32 mmol/L   Glucose, Bld 187 (H) 70 - 99 mg/dL    Comment: Glucose reference range applies only to samples taken after fasting for at least 8 hours.   BUN 17 8 - 23 mg/dL   Creatinine, Ser 1.30 (H) 0.44 - 1.00 mg/dL   Calcium 9.0 8.9 - 86.5 mg/dL   Total Protein 7.4 6.5 - 8.1 g/dL   Albumin 4.2 3.5 - 5.0 g/dL   AST 88 (H) 15 - 41 U/L    Comment: HEMOLYSIS AT THIS LEVEL MAY AFFECT RESULT   ALT 86 (H) 0 - 44 U/L    Comment: HEMOLYSIS AT THIS LEVEL MAY AFFECT RESULT   Alkaline Phosphatase 84 38 - 126 U/L   Total Bilirubin 2.2 (H) 0.3 - 1.2 mg/dL    Comment: HEMOLYSIS AT THIS LEVEL MAY AFFECT RESULT   GFR, Estimated 44 (L) >60 mL/min    Comment: (NOTE) Calculated using the CKD-EPI Creatinine Equation (2021)    Anion gap 19 (H) 5 - 15    Comment: Performed at [facility], [address] [place], [place] [address]  Lactic acid, plasma     Status: Abnormal   Collection Time: 12/26/22 11:23 PM  Result Value Ref Range   Lactic Acid, Venous 4.9 (HH) 0.5 - 1.9 mmol/L    Comment: CRITICAL RESULT CALLED TO, READ BACK BY AND VERIFIED WITH T. [facility], RN. [phone] 12/27/22. LPAIT Performed at [facility], [address], [place], [place] [address]   CBG monitoring, ED     Status: Abnormal   Collection Time: 12/26/22 11:37 PM  Result Value Ref Range   Glucose-Capillary 163 (H) 70 - 99 mg/dL    Comment: Glucose reference range applies only to samples taken after fasting for at least 8 hours.  Ethanol     Status: None   Collection Time: 12/27/22 12:45 AM  Result Value Ref Range   Alcohol, Ethyl (B) <10 <10 mg/dL    Comment: (NOTE) Lowest detectable limit for serum alcohol is 10 mg/dL.  For medical purposes only. Performed at [facility], [address] [place], [place] [address]   Protime-INR     Status: Abnormal   Collection Time: 12/27/22 12:45 AM  Result Value Ref Range  Prothrombin Time 17.0 (H) 11.4 - 15.2 seconds   INR 1.4 (H) 0.8 - 1.2    Comment: (NOTE) INR goal varies based on device and disease states. Performed at [facility], [address], [place], [place] [address]   APTT     Status: None   Collection Time: 12/27/22 12:45 AM  Result Value Ref Range   aPTT 24 24 - 36 seconds    Comment: Performed at [facility], [address] [place], [place] [address]  Urinalysis, Routine w reflex microscopic -Urine, Clean Catch     Status: Abnormal   Collection Time: 12/27/22 12:51 AM  Result Value Ref Range   Color, Urine YELLOW YELLOW   APPearance HAZY (A) CLEAR   Specific Gravity, Urine 1.023 1.005 - 1.030   pH 6.0 5.0 - 8.0   Glucose, UA >=500 (A) NEGATIVE mg/dL   Hgb urine dipstick LARGE (A) NEGATIVE   Bilirubin Urine NEGATIVE NEGATIVE   Ketones, ur 20 (A) NEGATIVE mg/dL   Protein, ur 30 (A) NEGATIVE mg/dL   Nitrite NEGATIVE NEGATIVE   Leukocytes,Ua MODERATE (A) NEGATIVE   RBC / HPF 0-5 0 - 5 RBC/hpf   WBC, UA 11-20 0 - 5 WBC/hpf   Bacteria, UA FEW (A) NONE SEEN   Squamous Epithelial / HPF 0-5 0 - 5 /HPF   Mucus PRESENT     Comment: Performed at [facility], [address] [place], [place] [address]  Lactic acid, plasma     Status: Abnormal   Collection Time: 12/27/22  2:05 AM  Result Value Ref Range   Lactic Acid, Venous 3.5 (HH) 0.5 - 1.9 mmol/L    Comment: CRITICAL VALUE NOTED. VALUE IS CONSISTENT WITH PREVIOUSLY REPORTED/CALLED VALUE Performed at [facility], [address], [place], [place] [address]    CT Angio Chest/Abd/Pel for Dissection W and/or W/WO  Result Date: 12/27/2022 CLINICAL DATA:  Abdominal pain EXAM: CT ANGIOGRAPHY CHEST, ABDOMEN AND PELVIS TECHNIQUE: Non-contrast CT of the chest was initially obtained. Multidetector CT imaging through the chest, abdomen and pelvis was performed using the standard protocol during bolus administration of intravenous contrast. Multiplanar reconstructed images  and MIPs were obtained and reviewed to evaluate the vascular anatomy. RADIATION DOSE REDUCTION: This exam was performed according to the departmental dose-optimization program which includes automated exposure control, adjustment of the mA and/or kV according to patient size and/or use of iterative reconstruction technique. CONTRAST:  75mL OMNIPAQUE IOHEXOL 350 MG/ML SOLN COMPARISON:  CT abdomen/pelvis dated 01/27/2022 FINDINGS: CTA CHEST FINDINGS Cardiovascular: On unenhanced CT, there is no evidence of intramural hematoma. Following contrast administration, there is no evidence of thoracic aortic aneurysm or dissection. Mild atherosclerotic calcifications of the aortic arch. Although not tailored for evaluation of the pulmonary arteries, there is no evidence of pulmonary embolism to the lobar level. The heart is normal in size.  No pericardial effusion. Mediastinum/Nodes: No suspicious mediastinal lymphadenopathy. Visualized thyroid is unremarkable. Lungs/Pleura: Lungs are essentially clear. Very mild scarring/atelectasis in the inferomedial right middle lobe and inferior lingula. No focal consolidation. No suspicious pulmonary nodules. No pleural effusion or pneumothorax. Musculoskeletal: Visualized osseous structures are within normal limits. Review of the MIP images confirms the above findings. CTA ABDOMEN AND PELVIS FINDINGS VASCULAR Aorta: No evidence of abdominal aortic aneurysm or dissection. Patent. Atherosclerotic calcifications. Celiac: Patent. SMA: Patent. Renals: Patent bilaterally. Atherosclerotic calcifications at the origin. IMA: Patent. Inflow: Patent bilaterally. Veins: Unremarkable. Review of the MIP images confirms the above findings.  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 Status post cholecystectomy. No intrahepatic or extrahepatic duct  dilatation. Pancreas: Within normal limits. Spleen: Within normal limits. Adrenals/Urinary Tract: Adrenal glands are within normal limits. Bilateral renal cortical atrophy.  No hydronephrosis. Bladder is within normal limits. Stomach/Bowel: Stomach is within normal limits. No evidence of bowel obstruction. Appendix is not discretely visualized. Left colonic diverticulosis, without evidence of diverticulitis. Lymphatic: No suspicious abdominopelvic lymphadenopathy. Reproductive: Status post hysterectomy. Bilateral ovaries are within normal limits. Other: No abdominopelvic ascites. Musculoskeletal: Degenerative changes of the lumbar spine. Review of the MIP images confirms the above findings. IMPRESSION: No evidence of thoracoabdominal aortic aneurysm or dissection. No evidence of pulmonary embolism. No CT findings to account for the patient's abdominal pain. Additional ancillary findings as above. Electronically Signed   By: Charline Bills M.D.   On: 12/27/2022 00:49    Pending Labs Unresulted Labs (From admission, onward)     Start     Ordered   12/27/22 0029  Urine Culture  Once,   URGENT       Question:  Indication  Answer:  Sepsis   12/27/22 0028   12/27/22 0028  Blood Culture (routine x 2)  (Undifferentiated presentation (screening labs and basic nursing orders))  BLOOD CULTURE X 2,   STAT      12/27/22 0028   12/26/22 2318  Gastrointestinal Panel by PCR , Stool  (Gastrointestinal Panel by PCR, Stool                                                                                                                                                     **Does Not include CLOSTRIDIUM DIFFICILE testing. **If CDIFF testing is needed, place order from the "C Difficile Testing" order set.**)  Once,   URGENT        12/26/22 2317   12/26/22 2318  C Difficile Quick Screen w PCR reflex  (C Difficile quick screen w PCR reflex panel )  Once, for 24 hours,   URGENT       References:    CDiff Information Tool    12/26/22 2317            Vitals/Pain Today's Vitals   12/26/22 2359 12/27/22 0000 12/27/22 0015 12/27/22 0211  BP:  (!) 158/83 (!) 158/72   Pulse:  85 87   Resp:      Temp:    98.4 F (36.9 C)  TempSrc:    Oral  SpO2:  96% 95%   Weight:      Height:      PainSc: 6        Isolation Precautions Enteric precautions (UV disinfection)  Medications Medications  metroNIDAZOLE (FLAGYL) IVPB 500 mg (0 mg  Intravenous Stopped 12/27/22 0205)  vancomycin (VANCOREADY) IVPB 1500 mg/300 mL (1,500 mg Intravenous New Bag/Given 12/27/22 0204)    Followed by  vancomycin (VANCOCIN) IVPB 1000 mg/200 mL premix (has no administration in time range)  ceFEPIme (MAXIPIME) 2 g in sodium chloride 0.9 % 100 mL IVPB (0 g Intravenous Stopped 12/27/22 0205)  fentaNYL (SUBLIMAZE) injection 50 mcg (has no administration in time range)  sodium chloride 0.9 % bolus 500 mL (0 mLs Intravenous Stopped 12/26/22 2338)  famotidine (PEPCID) IVPB 20 mg premix (0 mg Intravenous Stopped 12/27/22 0006)  fentaNYL (SUBLIMAZE) injection 50 mcg (50 mcg Intravenous Given 12/26/22 2330)  ondansetron (ZOFRAN) injection 4 mg (4 mg Intravenous Given 12/26/22 2330)  iohexol (OMNIPAQUE) 350 MG/ML injection 75 mL (75 mLs Intravenous Contrast Given 12/27/22 0038)  sodium chloride 0.9 % bolus 500 mL (500 mLs Intravenous New Bag/Given 12/27/22 0210)    Mobility walks with device     Focused Assessments GI assessment here for n/v/d metabolic acidosis   R Recommendations: See Admitting Provider Note  Report given to:   Additional Notes: will not wear monitoring equipment removes brief and urinates on bed has had no emesis or diarrhea since arrival pending blood cultures x 2 and stool samples phlebotomist attempted lab collection unable to obtain vs wnl when able to obtain

## 2022-12-27 NOTE — ED Notes (Signed)
Once again pt has removed all monitoring equiptment

## 2022-12-27 NOTE — ED Notes (Signed)
Patient continues to remove monitoring equipment and request water. I once again explained why she could not have water at present time.

## 2022-12-27 NOTE — ED Notes (Signed)
Report called to floor

## 2022-12-27 NOTE — ED Notes (Signed)
Patient still removing monitoring equipment despite numerous verbal reminders. Does not follow commands can not lay still removes her brief and urinates on bed screams for water despite emesis and abdominal pain

## 2022-12-27 NOTE — Consult Note (Signed)
[facility] Gastroenterology Consultation Note  Referring Provider: No ref. provider found Primary Care Physician:  Kathryn Brooks, MD Primary Gastroenterologist:  Dr. Bosie Clos  Reason for Consultation:  elevated LFTs  HPI: Kathryn Wall is a 77 y.o. female whom I've been asked to see for evaluation of nausea, vomiting, diarrhea, elevated LFTs.  Patient is confused and agitated and can provide no history.  She thinks she is in hospital because she just broke her leg.   Past Medical History:  Diagnosis Date   Anxiety    Breast cancer (HCC) 06/29/1995   AGE 69, BRCA 1 NEGATIVE 2. 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    CKD (chronic kidney disease), stage III (HCC)    Cognitive deficits    mild   Depression    Diabetes mellitus    TYPE II   High cholesterol    Hypertension    PAF (paroxysmal atrial fibrillation) [facility])     Past Surgical History:  Procedure Laterality Date   ABDOMINAL HYSTERECTOMY  06/29/1983   TAH   ANKLE SURGERY Right    APPLICATION OF WOUND VAC Left 01/14/2022   Procedure: APPLICATION OF WOUND VAC;  Surgeon: Peggye Form, DO;  Location: [facility] OR;  Service: Plastics;  Laterality: Left;   BREAST SURGERY  06/29/1995   RIGHT BREAST LUMPECTOMY   CHOLECYSTECTOMY  06/28/1978   COLONOSCOPY      Prior to Admission medications   Medication Sig Start Date End Date Taking? Authorizing Provider  acetaminophen (TYLENOL) 325 MG tablet Take 2 tablets (650 mg total) by mouth every 6 (six) hours as needed for mild pain (or Fever >/= 101). 01/05/21   Rhetta Mura, MD  amLODipine (NORVASC) 5 MG tablet TAKE 1 TABLET BY MOUTH ONCE A DAY 04/23/22   Kathryn Brooks, MD  atorvastatin (LIPITOR) 40 MG tablet TAKE ONE TABLET BY MOUTH ONCE A DAY 11/02/22   Kathryn Brooks, MD  calcium carbonate (OS-CAL - DOSED IN MG OF ELEMENTAL CALCIUM) 1250 (500 Ca) MG tablet Take 1 tablet (1,250 mg total) by mouth 2 (two) times daily with a meal. 03/27/22   Lonia Blood, MD  cyanocobalamin (VITAMIN B12) 1000 MCG tablet Take 1 tablet (1,000 mcg total) by mouth daily. 03/27/22   Lonia Blood, MD  ELIQUIS 5 MG TABS tablet TAKE 1 TABLET BY MOUTH TWICE A DAY 04/23/22   Kathryn Brooks, MD  JARDIANCE 25 MG TABS tablet TAKE 1 TABLET BY MOUTH ONCE A DAY 04/23/22   Kathryn Brooks, MD  losartan (COZAAR) 50 MG tablet TAKE ONE TABLET BY MOUTH ONCE A DAY 11/02/22   Kathryn Brooks, MD  metFORMIN (GLUCOPHAGE-XR) 500 MG 24 hr tablet TAKE 4 TABLETS BY MOUTH ONCE DAILY WITH BREAKFAST. 04/23/22   Kathryn Brooks, MD  metoprolol succinate (TOPROL-XL) 25 MG 24 hr tablet TAKE ONE TABLET BY MOUTH DAILY 12/13/22   Kathryn Brooks, MD  MYRBETRIQ 25 MG TB24 tablet TAKE ONE TABLET BY MOUTH DAILY 09/13/22   Kathryn Brooks, MD  pantoprazole (PROTONIX) 40 MG tablet TAKE ONE TABLET BY MOUTH EVERY MORNING 11/09/22   Kathryn Brooks, MD  PARoxetine (PAXIL) 20 MG tablet TAKE ONE TABLET BY MOUTH ONCE A DAY 11/09/22   Kathryn Brooks, MD  QUEtiapine (SEROQUEL) 50 MG tablet TAKE ONE TABLET BY MOUTH EVERY NIGHT AT BEDTIME 09/13/22   Kathryn Brooks, MD  venlafaxine XR (EFFEXOR-XR) 75 MG 24 hr capsule TAKE ONE CAPSULE BY MOUTH ONCE  DAILY WITH BREAKFAST 11/09/22   Kathryn Brooks, MD    Current Facility-Administered Medications  Medication Dose Route Frequency Provider Last Rate Last Admin   0.9 %  sodium chloride infusion   Intravenous Continuous Gery Pray, MD 125 mL/hr at 12/27/22 0657 New Bag at 12/27/22 0657   amLODipine (NORVASC) tablet 5 mg  5 mg Oral Daily Gery Pray, MD       heparin injection 5,000 Units  5,000 Units Subcutaneous Q8H Gery Pray, MD   5,000 Units at 12/27/22 0516   hydrALAZINE (APRESOLINE) injection 5 mg  5 mg Intravenous Q4H PRN Crosley, Debby, MD       HYDROmorphone (DILAUDID) injection 1 mg  1 mg Intravenous Q4H PRN Crosley, Debby, MD       insulin aspart (novoLOG) injection 0-9 Units  0-9 Units Subcutaneous Q4H Crosley, Debby, MD        LORazepam (ATIVAN) injection 0.5 mg  0.5 mg Intravenous Q6H PRN Joneen Roach, Debby, MD   0.5 mg at 12/27/22 0739   losartan (COZAAR) tablet 50 mg  50 mg Oral Daily Crosley, Debby, MD       metoprolol succinate (TOPROL-XL) 24 hr tablet 25 mg  25 mg Oral Daily Crosley, Debby, MD       ondansetron (ZOFRAN) tablet 4 mg  4 mg Oral Q6H PRN Crosley, Debby, MD       Or   ondansetron (ZOFRAN) injection 4 mg  4 mg Intravenous Q6H PRN Crosley, Debby, MD       PARoxetine (PAXIL) tablet 20 mg  20 mg Oral Daily Crosley, Debby, MD       piperacillin-tazobactam (ZOSYN) IVPB 3.375 g  3.375 g Intravenous Q8H Stevphen Rochester, RPH 12.5 mL/hr at 12/27/22 0744 3.375 g at 12/27/22 0744   QUEtiapine (SEROQUEL) tablet 25 mg  25 mg Oral QHS Crosley, Debby, MD       venlafaxine XR (EFFEXOR-XR) 24 hr capsule 75 mg  75 mg Oral Q breakfast Joneen Roach, Debby, MD   75 mg at 12/27/22 0740    Allergies as of 12/26/2022 - Review Complete 12/26/2022  Allergen Reaction Noted   Ambien [zolpidem tartrate] Other (See Comments) 01/02/2021   Cymbalta [duloxetine hcl] Other (See Comments) 12/01/2010   Other  12/01/2010   Ciprofloxacin Nausea Only 11/10/2015   Doxazosin Nausea And Vomiting 03/17/2017   Percocet [oxycodone-acetaminophen] Nausea And Vomiting 02/24/2019    Family History  Problem Relation Age of Onset   Cancer Mother        COLON   Hypertension Father    Heart disease Father     Social History   Socioeconomic History   Marital status: Married    Spouse name: Not on file   Number of children: Not on file   Years of education: Not on file   Highest education level: Not on file  Occupational History   Not on file  Tobacco Use   Smoking status: Never   Smokeless tobacco: Never  Vaping Use   Vaping Use: Never used  Substance and Sexual Activity   Alcohol use: No   Drug use: No   Sexual activity: Yes    Birth control/protection: Post-menopausal, Surgical    Comment: hysterectomy  Other Topics Concern    Not on file  Social History Narrative   Not on file   Social Determinants of Health   Financial Resource Strain: Not on file  Food Insecurity: No Food Insecurity (03/24/2022)   Hunger Vital Sign    Worried About  Running Out of Food in the Last Year: Never true    Ran Out of Food in the Last Year: Never true  Transportation Needs: No Transportation Needs (03/24/2022)   PRAPARE - Administrator, Civil Service (Medical): No    Lack of Transportation (Non-Medical): No  Physical Activity: Not on file  Stress: Not on file  Social Connections: Not on file  Intimate Partner Violence: Not At Risk (03/24/2022)   Humiliation, Afraid, Rape, and Kick questionnaire    Fear of Current or Ex-Partner: No    Emotionally Abused: No    Physically Abused: No    Sexually Abused: No    Review of Systems: Unable to obtain due to altered mental status  Physical Exam: Vital signs in last 24 hours: Temp:  [97.9 F (36.6 C)-98.4 F (36.9 C)] 98 F (36.7 C) (07/01 0700) Pulse Rate:  [61-87] 61 (07/01 0700) Resp:  [17-22] 17 (07/01 0700) BP: (139-173)/(72-98) 139/81 (07/01 0700) SpO2:  [93 %-100 %] 93 % (07/01 0600) Weight:  [75 kg] 75 kg (06/30 2207) Last BM Date : 12/26/22 General:   Confused, unable to converse coherently, a bit agitated Head:  Normocephalic and atraumatic. Eyes:  Sclera clear, no icterus.   Conjunctiva pink. Ears:  Normal auditory acuity. Nose:  No deformity, discharge,  or lesions. Mouth:  No deformity or lesions.  Oropharynx pink but dry Neck:  Supple; no masses or thyromegaly. Lungs:  No respiratory distress Abdomen:  Soft, nontender and nondistended. No masses, hepatosplenomegaly or hernias noted. Without guarding, and without rebound.     Msk:  Symmetrical without gross deformities. Normal posture. Pulses:  Normal pulses noted. Extremities:  Without clubbing or edema. Neurologic:  Confused, unable to converse coherently Skin:  Intact without significant  lesions or rashes. Cervical Nodes:  No significant cervical adenopathy. Psych:  Confused, agitated   Lab Results: Recent Labs    12/26/22 2308 12/27/22 0412  WBC 22.0* 17.1*  HGB 14.4 12.9  HCT 42.7 39.3  PLT 312 253   BMET Recent Labs    12/26/22 2308 12/27/22 0412  NA 139 140  K 4.2 3.9  CL 107 107  CO2 13* 15*  GLUCOSE 187* 168*  BUN 17 15  CREATININE 1.25* 1.05*  CALCIUM 9.0 8.6*   LFT Recent Labs    12/27/22 0412  PROT 6.6  ALBUMIN 3.8  AST 116*  ALT 85*  ALKPHOS 77  BILITOT 2.0*   PT/INR Recent Labs    12/27/22 0045  LABPROT 17.0*  INR 1.4*    Studies/Results: US Abdomen Limited RUQ (LIVER/GB)  Result Date: 12/27/2022 CLINICAL DATA:  Elevated liver function tests EXAM: ULTRASOUND ABDOMEN LIMITED RIGHT UPPER QUADRANT COMPARISON:  12/27/2022 CT chest abdomen pelvis FINDINGS: Gallbladder: Surgically absent Common bile duct: Diameter: 3 mm Liver: Parenchymal echogenicity: Within normal limits Contours: Normal Lesions: None. The lesion seen in the right hepatic lobe on the CT is not well visualized on ultrasound. Portal vein: Patent.  Hepatopetal flow Other: None. IMPRESSION: No significant sonographic abnormality of the liver. Electronically Signed   By: Acquanetta Belling M.D.   On: 12/27/2022 08:07   CT Angio Chest/Abd/Pel for Dissection W and/or W/WO  Result Date: 12/27/2022 CLINICAL DATA:  Abdominal pain EXAM: CT ANGIOGRAPHY CHEST, ABDOMEN AND PELVIS TECHNIQUE: Non-contrast CT of the chest was initially obtained. Multidetector CT imaging through the chest, abdomen and pelvis was performed using the standard protocol during bolus administration of intravenous contrast. Multiplanar reconstructed images and MIPs were obtained and  reviewed to evaluate the vascular anatomy. RADIATION DOSE REDUCTION: This exam was performed according to the departmental dose-optimization program which includes automated exposure control, adjustment of the mA and/or kV according to  patient size and/or use of iterative reconstruction technique. CONTRAST:  75mL OMNIPAQUE IOHEXOL 350 MG/ML SOLN COMPARISON:  CT abdomen/pelvis dated 01/27/2022 FINDINGS: CTA CHEST FINDINGS Cardiovascular: On unenhanced CT, there is no evidence of intramural hematoma. Following contrast administration, there is no evidence of thoracic aortic aneurysm or dissection. Mild atherosclerotic calcifications of the aortic arch. Although not tailored for evaluation of the pulmonary arteries, there is no evidence of pulmonary embolism to the lobar level. The heart is normal in size.  No pericardial effusion. Mediastinum/Nodes: No suspicious mediastinal lymphadenopathy. Visualized thyroid is unremarkable. Lungs/Pleura: Lungs are essentially clear. Very mild scarring/atelectasis in the inferomedial right middle lobe and inferior lingula. No focal consolidation. No suspicious pulmonary nodules. No pleural effusion or pneumothorax. Musculoskeletal: Visualized osseous structures are within normal limits. Review of the MIP images confirms the above findings. CTA ABDOMEN AND PELVIS FINDINGS VASCULAR Aorta: No evidence of abdominal aortic aneurysm or dissection. Patent. Atherosclerotic calcifications. Celiac: Patent. SMA: Patent. Renals: Patent bilaterally. Atherosclerotic calcifications at the origin. IMA: Patent. Inflow: Patent bilaterally. Veins: Unremarkable. Review of the MIP images confirms the above findings. 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 Status post cholecystectomy. No intrahepatic or extrahepatic duct dilatation. Pancreas: Within normal limits. Spleen: Within normal limits. Adrenals/Urinary Tract: Adrenal glands are within normal limits. Bilateral renal cortical atrophy.  No hydronephrosis. Bladder is within normal limits. Stomach/Bowel: Stomach is within  normal limits. No evidence of bowel obstruction. Appendix is not discretely visualized. Left colonic diverticulosis, without evidence of diverticulitis. Lymphatic: No suspicious abdominopelvic lymphadenopathy. Reproductive: Status post hysterectomy. Bilateral ovaries are within normal limits. Other: No abdominopelvic ascites. Musculoskeletal: Degenerative changes of the lumbar spine. Review of the MIP images confirms the above findings. IMPRESSION: No evidence of thoracoabdominal aortic aneurysm or dissection. No evidence of pulmonary embolism. No CT findings to account for the patient's abdominal pain. Additional ancillary findings as above. Electronically Signed   By: Charline Bills M.D.   On: 12/27/2022 00:49    Impression:   Elevated liver enzymes.  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 Altered mental status, confusion. Abdominal pain, nausea, vomiting, diarrhea per report.  Patient unable to corroborate these symptoms. Multiple medical problems.  Plan:   Patient unable to provide any history.  I do not think she has gallstone or biliary tract process, no suspicion of cirrhosis, and I don't think her elevated LFTs (which are only mildly elevated) is cause of her altered mental status. Would follow LFTs and consider further work-up for altered mental status per hospitalist team. Awaiting acute hepatitis panel.   If documented diarrhea, check GI path panel and C. Diff. [facility] will follow along at a distance.   LOS: 0 days   Jatia Musa M  12/27/2022, 10:17 AM  Cell [phone] If no answer or after 5 PM call [phone]

## 2022-12-27 NOTE — Progress Notes (Signed)
PROGRESS NOTE        PATIENT DETAILS Name: Kathryn Wall Age: 77 y.o. Sex: female Date of Birth: 03-01-1946 Admit Date: 12/26/2022 Admitting Physician Gery Pray, MD RUE:[id number], Priscille Heidelberg, MD  Brief Summary: Patient is a 77 y.o.  female with history of HTN, HLD, DM-2, PAF on Eliquis, breast cancer-s/p lumpectomy/radiation-presented with abdominal pain, along with nausea, vomiting and diarrhea.  Significant events: 6/30>> admit to [facility]  Significant studies: 7/1>> CT angio chest abdomen/pelvis: No evidence of aneurysm/dissection/PE.  No acute findings. 7/1>> RUQ ultrasound: No significant abnormality  Significant microbiology data: 7/1>> blood culture: Pending  Procedures: None  Consults: GI  Subjective: Appears comfortable-abdominal pain has resolved-no further vomiting or diarrhea this morning.  Objective: Vitals: Blood pressure 139/81, pulse 61, temperature 98 F (36.7 C), temperature source Oral, resp. rate 17, height 5\' 5"  (1.651 m), weight 75 kg, SpO2 93 %.   Exam: Gen Exam:Alert awake-not in any distress HEENT:atraumatic, normocephalic Chest: B/L clear to auscultation anteriorly CVS:S1S2 regular Abdomen:soft non tender, non distended Extremities:no edema Neurology: Non focal Skin: no rash  Pertinent Labs/Radiology:    Latest Ref Rng & Units 12/27/2022    4:12 AM 12/26/2022   11:08 PM 08/05/2022    3:21 PM  CBC  WBC 4.0 - 10.5 K/uL 17.1  22.0  8.4   Hemoglobin 12.0 - 15.0 g/dL 19.1  47.8  29.5   Hematocrit 36.0 - 46.0 % 39.3  42.7  43.3   Platelets 150 - 400 K/uL 253  312  268     Lab Results  Component Value Date   NA 140 12/27/2022   K 3.9 12/27/2022   CL 107 12/27/2022   CO2 15 (L) 12/27/2022      Assessment/Plan: Abdominal pain with nausea/vomiting/diarrhea ?  Viral etiology Improved with just supportive care CT imaging nonacute Continue supportive care however-will stop antibiotics and monitor If diarrhea  reoccurs-send stool studies  Transaminitis Probably reflective of viral gastroenteritis CT abdomen/RUQ ultrasound nonacute (s/p cholecystectomy) Trend LFTs  Leukocytosis Likely secondary to viral gastroenteritis Follow blood cultures  Rhabdomyolysis Mild Nontraumatic Hydrate with IVF-trend CK  CKD stage IIIa At baseline  Asymptomatic bacteriuria Does not have any symptoms consistent with UTI Stop Zosyn and monitor off antibiotics.  HLD Statin on hold until rhabdomyolysis improves further  HTN BP stable Norvasc/metoprolol/losartan  PAF Telemetry monitoring Metoprolol Resume Eliquis  Type II DM CBG stable with SSI Resume oral hypoglycemic agents on discharge  Recent Labs    12/26/22 2337 12/27/22 0828  GLUCAP 163* 138*    Chronic anxiety/depression Continue Paxil/Seroquel/venlafaxine  GERD PPI  BMI: Estimated body mass index is 27.51 kg/m as calculated from the following:   Height as of this encounter: 5\' 5"  (1.651 m).   Weight as of this encounter: 75 kg.   Code status:   Code Status: Full Code   DVT Prophylaxis: apixaban (ELIQUIS) tablet 5 mg     Family Communication: None at bedside   Disposition Plan: Status is: Observation The patient will require care spanning > 2 midnights and should be moved to inpatient because: Severity of illness   Planned Discharge Destination:Home   Diet: Diet Order             Diet clear liquid Room service appropriate? Yes; Fluid consistency: Thin  Diet effective now  Antimicrobial agents: Anti-infectives (From admission, onward)    Start     Dose/Rate Route Frequency Ordered Stop   12/28/22 0045  vancomycin (VANCOCIN) IVPB 1000 mg/200 mL premix  Status:  Discontinued       See Hyperspace for full Linked Orders Report.   1,000 mg 200 mL/hr over 60 Minutes Intravenous Every 24 hours 12/27/22 0036 12/27/22 0641   12/27/22 0745  piperacillin-tazobactam (ZOSYN) IVPB 3.375 g   Status:  Discontinued        3.375 g 12.5 mL/hr over 240 Minutes Intravenous Every 8 hours 12/27/22 0648 12/27/22 1149   12/27/22 0045  vancomycin (VANCOREADY) IVPB 1500 mg/300 mL       See Hyperspace for full Linked Orders Report.   1,500 mg 150 mL/hr over 120 Minutes Intravenous  Once 12/27/22 0036 12/27/22 0435   12/27/22 0045  ceFEPIme (MAXIPIME) 2 g in sodium chloride 0.9 % 100 mL IVPB  Status:  Discontinued        2 g 200 mL/hr over 30 Minutes Intravenous Every 12 hours 12/27/22 0036 12/27/22 0641   12/27/22 0030  metroNIDAZOLE (FLAGYL) IVPB 500 mg  Status:  Discontinued        500 mg 100 mL/hr over 60 Minutes Intravenous Every 12 hours 12/27/22 0029 12/27/22 0641        MEDICATIONS: Scheduled Meds:  amLODipine  5 mg Oral Daily   apixaban  5 mg Oral BID   cyanocobalamin  1,000 mcg Oral Daily   insulin aspart  0-9 Units Subcutaneous TID WC   losartan  50 mg Oral Daily   metoprolol succinate  25 mg Oral Daily   mirabegron ER  25 mg Oral Daily   pantoprazole  40 mg Oral q morning   PARoxetine  20 mg Oral Daily   QUEtiapine  25 mg Oral QHS   venlafaxine XR  75 mg Oral Q breakfast   Continuous Infusions:  sodium chloride 125 mL/hr at 12/27/22 0657   PRN Meds:.hydrALAZINE, HYDROmorphone (DILAUDID) injection, LORazepam, ondansetron **OR** ondansetron (ZOFRAN) IV   I have personally reviewed following labs and imaging studies  LABORATORY DATA: CBC: Recent Labs  Lab 12/26/22 2308 12/27/22 0412  WBC 22.0* 17.1*  NEUTROABS 19.1* 14.4*  HGB 14.4 12.9  HCT 42.7 39.3  MCV 89.5 91.2  PLT 312 253    Basic Metabolic Panel: Recent Labs  Lab 12/26/22 2308 12/27/22 0412  NA 139 140  K 4.2 3.9  CL 107 107  CO2 13* 15*  GLUCOSE 187* 168*  BUN 17 15  CREATININE 1.25* 1.05*  CALCIUM 9.0 8.6*    GFR: Estimated Creatinine Clearance: 44.8 mL/min (A) (by C-G formula based on SCr of 1.05 mg/dL (H)).  Liver Function Tests: Recent Labs  Lab 12/26/22 2308  12/27/22 0412  AST 88* 116*  ALT 86* 85*  ALKPHOS 84 77  BILITOT 2.2* 2.0*  PROT 7.4 6.6  ALBUMIN 4.2 3.8   Recent Labs  Lab 12/26/22 2308  LIPASE 61*   No results for input(s): "AMMONIA" in the last 168 hours.  Coagulation Profile: Recent Labs  Lab 12/27/22 0045  INR 1.4*    Cardiac Enzymes: Recent Labs  Lab 12/27/22 0412  CKTOTAL 3,672*    BNP (last 3 results) No results for input(s): "PROBNP" in the last 8760 hours.  Lipid Profile: No results for input(s): "CHOL", "HDL", "LDLCALC", "TRIG", "CHOLHDL", "LDLDIRECT" in the last 72 hours.  Thyroid Function Tests: No results for input(s): "TSH", "T4TOTAL", "FREET4", "T3FREE", "THYROIDAB" in the last  72 hours.  Anemia Panel: No results for input(s): "VITAMINB12", "FOLATE", "FERRITIN", "TIBC", "IRON", "RETICCTPCT" in the last 72 hours.  Urine analysis:    Component Value Date/Time   COLORURINE YELLOW 12/27/2022 0051   APPEARANCEUR HAZY (A) 12/27/2022 0051   LABSPEC 1.023 12/27/2022 0051   PHURINE 6.0 12/27/2022 0051   GLUCOSEU >=500 (A) 12/27/2022 0051   HGBUR LARGE (A) 12/27/2022 0051   BILIRUBINUR NEGATIVE 12/27/2022 0051   KETONESUR 20 (A) 12/27/2022 0051   PROTEINUR 30 (A) 12/27/2022 0051   UROBILINOGEN 0.2 01/13/2015 1201   NITRITE NEGATIVE 12/27/2022 0051   LEUKOCYTESUR MODERATE (A) 12/27/2022 0051    Sepsis Labs: Lactic Acid, Venous    Component Value Date/Time   LATICACIDVEN 1.4 12/27/2022 0805    MICROBIOLOGY: No results found for this or any previous visit (from the past 240 hour(s)).  RADIOLOGY STUDIES/RESULTS: US Abdomen Limited RUQ (LIVER/GB)  Result Date: 12/27/2022 CLINICAL DATA:  Elevated liver function tests EXAM: ULTRASOUND ABDOMEN LIMITED RIGHT UPPER QUADRANT COMPARISON:  12/27/2022 CT chest abdomen pelvis FINDINGS: Gallbladder: Surgically absent Common bile duct: Diameter: 3 mm Liver: Parenchymal echogenicity: Within normal limits Contours: Normal Lesions: None. The lesion seen in  the right hepatic lobe on the CT is not well visualized on ultrasound. Portal vein: Patent.  Hepatopetal flow Other: None. IMPRESSION: No significant sonographic abnormality of the liver. Electronically Signed   By: Acquanetta Belling M.D.   On: 12/27/2022 08:07   CT Angio Chest/Abd/Pel for Dissection W and/or W/WO  Result Date: 12/27/2022 CLINICAL DATA:  Abdominal pain EXAM: CT ANGIOGRAPHY CHEST, ABDOMEN AND PELVIS TECHNIQUE: Non-contrast CT of the chest was initially obtained. Multidetector CT imaging through the chest, abdomen and pelvis was performed using the standard protocol during bolus administration of intravenous contrast. Multiplanar reconstructed images and MIPs were obtained and reviewed to evaluate the vascular anatomy. RADIATION DOSE REDUCTION: This exam was performed according to the departmental dose-optimization program which includes automated exposure control, adjustment of the mA and/or kV according to patient size and/or use of iterative reconstruction technique. CONTRAST:  75mL OMNIPAQUE IOHEXOL 350 MG/ML SOLN COMPARISON:  CT abdomen/pelvis dated 01/27/2022 FINDINGS: CTA CHEST FINDINGS Cardiovascular: On unenhanced CT, there is no evidence of intramural hematoma. Following contrast administration, there is no evidence of thoracic aortic aneurysm or dissection. Mild atherosclerotic calcifications of the aortic arch. Although not tailored for evaluation of the pulmonary arteries, there is no evidence of pulmonary embolism to the lobar level. The heart is normal in size.  No pericardial effusion. Mediastinum/Nodes: No suspicious mediastinal lymphadenopathy. Visualized thyroid is unremarkable. Lungs/Pleura: Lungs are essentially clear. Very mild scarring/atelectasis in the inferomedial right middle lobe and inferior lingula. No focal consolidation. No suspicious pulmonary nodules. No pleural effusion or pneumothorax. Musculoskeletal: Visualized osseous structures are within normal limits. Review of  the MIP images confirms the above findings. CTA ABDOMEN AND PELVIS FINDINGS VASCULAR Aorta: No evidence of abdominal aortic aneurysm or dissection. Patent. Atherosclerotic calcifications. Celiac: Patent. SMA: Patent. Renals: Patent bilaterally. Atherosclerotic calcifications at the origin. IMA: Patent. Inflow: Patent bilaterally. Veins: Unremarkable. Review of the MIP images confirms the above findings. 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 Status post cholecystectomy. No intrahepatic or extrahepatic duct dilatation. Pancreas: Within normal limits. Spleen: Within normal limits. Adrenals/Urinary Tract: Adrenal glands are within normal limits. Bilateral renal cortical atrophy.  No hydronephrosis. Bladder is within normal limits. Stomach/Bowel: Stomach is within normal  limits. No evidence of bowel obstruction. Appendix is not discretely visualized. Left colonic diverticulosis, without evidence of diverticulitis. Lymphatic: No suspicious abdominopelvic lymphadenopathy. Reproductive: Status post hysterectomy. Bilateral ovaries are within normal limits. Other: No abdominopelvic ascites. Musculoskeletal: Degenerative changes of the lumbar spine. Review of the MIP images confirms the above findings. IMPRESSION: No evidence of thoracoabdominal aortic aneurysm or dissection. No evidence of pulmonary embolism. No CT findings to account for the patient's abdominal pain. Additional ancillary findings as above. Electronically Signed   By: Charline Bills M.D.   On: 12/27/2022 00:49     LOS: 0 days   Jeoffrey Massed, MD  [facility]    To contact the attending provider between 7A-7P or the covering provider during after hours 7P-7A, please log into the web site [email or web address] and access using universal  password for that web site. If you do not  have the password, please call the hospital operator.  12/27/2022, 11:50 AM

## 2022-12-27 NOTE — H&P (Signed)
PCP:   Donita Brooks, MD   Chief Complaint:  Nausea, vomiting, abdominal pain  HPI: This is a 77 year old female with PMH of HTN, HLD, anxiety, T2DM, paroxysmal atrial fibrillation on Eliquis, breast cancer with lumpectomy and radiation BRCA negative.  Patient presents with complaints of nausea, vomiting, diarrhea and severe abdominal pain.  Per patient her stomach hurt all night periumbilical and suprapubic in location and she had continuous nausea vomiting.  She denies hematemesis..  She endorses subjective low-grade fevers and chills.  Symptoms started yesterday.  This is at least patient's third occurrence with the same.  Per chart review 2/20 and 4/4.  Per patient she has a annual colonoscopy.  Last colonoscopy was 01/11/2022 done by [facility] Dr Bosie Clos. I am unable to see report.  Patient is unable to clearly state why she needs annual colonoscopies.    In the ER patient is extremely anxious. Initial lactic acid 4.9, repeat 3.5, CK 3,672, AST 88, ALT 86, T. bili 2.2, WBCs 22.  CTA chest, abdomen and pelvis no evidence of aneurysm or direction, PE.  Benign findings. C. difficile and GI pathogen ordered by EDP. During my interview, patient's writhing, and in constant uncomfortable movement and agitation on the bed.  Chart review revealed she was on Valium which has been weaned off secondary to overmedication falls.  Patient complaining of uncontrolled pain poorly responsive to pain meds.  Patient given Ativan 1 mg and calm down.  Patient is a relatively poor or scant historian.  Not sure of reliability of history  Review of Systems:  Per HPI  Past Medical History: Past Medical History:  Diagnosis Date   Anxiety    Breast cancer (HCC) 06/29/1995   AGE 84, BRCA 1 NEGATIVE 2. 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    CKD (chronic kidney disease), stage III (HCC)    Cognitive deficits    mild   Depression    Diabetes mellitus    TYPE II   High cholesterol     Hypertension    PAF (paroxysmal atrial fibrillation) (HCC)    Past Surgical History:  Procedure Laterality Date   ABDOMINAL HYSTERECTOMY  06/29/1983   TAH   ANKLE SURGERY Right    APPLICATION OF WOUND VAC Left 01/14/2022   Procedure: APPLICATION OF WOUND VAC;  Surgeon: Peggye Form, DO;  Location: [facility] OR;  Service: Plastics;  Laterality: Left;   BREAST SURGERY  06/29/1995   RIGHT BREAST LUMPECTOMY   CHOLECYSTECTOMY  06/28/1978   COLONOSCOPY      Medications: Prior to Admission medications   Medication Sig Start Date End Date Taking? Authorizing Provider  acetaminophen (TYLENOL) 325 MG tablet Take 2 tablets (650 mg total) by mouth every 6 (six) hours as needed for mild pain (or Fever >/= 101). 01/05/21   Rhetta Mura, MD  amLODipine (NORVASC) 5 MG tablet TAKE 1 TABLET BY MOUTH ONCE A DAY 04/23/22   Donita Brooks, MD  atorvastatin (LIPITOR) 40 MG tablet TAKE ONE TABLET BY MOUTH ONCE A DAY 11/02/22   Donita Brooks, MD  calcium carbonate (OS-CAL - DOSED IN MG OF ELEMENTAL CALCIUM) 1250 (500 Ca) MG tablet Take 1 tablet (1,250 mg total) by mouth 2 (two) times daily with a meal. 03/27/22   Lonia Blood, MD  cyanocobalamin (VITAMIN B12) 1000 MCG tablet Take 1 tablet (1,000 mcg total) by mouth daily. 03/27/22   Lonia Blood, MD  ELIQUIS 5 MG TABS tablet TAKE 1 TABLET BY MOUTH  TWICE A DAY 04/23/22   Donita Brooks, MD  JARDIANCE 25 MG TABS tablet TAKE 1 TABLET BY MOUTH ONCE A DAY 04/23/22   Donita Brooks, MD  losartan (COZAAR) 50 MG tablet TAKE ONE TABLET BY MOUTH ONCE A DAY 11/02/22   Donita Brooks, MD  metFORMIN (GLUCOPHAGE-XR) 500 MG 24 hr tablet TAKE 4 TABLETS BY MOUTH ONCE DAILY WITH BREAKFAST. 04/23/22   Donita Brooks, MD  metoprolol succinate (TOPROL-XL) 25 MG 24 hr tablet TAKE ONE TABLET BY MOUTH DAILY 12/13/22   Donita Brooks, MD  MYRBETRIQ 25 MG TB24 tablet TAKE ONE TABLET BY MOUTH DAILY 09/13/22   Donita Brooks, MD  pantoprazole  (PROTONIX) 40 MG tablet TAKE ONE TABLET BY MOUTH EVERY MORNING 11/09/22   Donita Brooks, MD  PARoxetine (PAXIL) 20 MG tablet TAKE ONE TABLET BY MOUTH ONCE A DAY 11/09/22   Donita Brooks, MD  QUEtiapine (SEROQUEL) 50 MG tablet TAKE ONE TABLET BY MOUTH EVERY NIGHT AT BEDTIME 09/13/22   Donita Brooks, MD  venlafaxine XR (EFFEXOR-XR) 75 MG 24 hr capsule TAKE ONE CAPSULE BY MOUTH ONCE DAILY WITH BREAKFAST 11/09/22   Donita Brooks, MD    Allergies:   Allergies  Allergen Reactions   Ambien [Zolpidem Tartrate] Other (See Comments)    Can not tolerate, causes sleepwalking   Cymbalta [Duloxetine Hcl] Other (See Comments)    Unknown Reaction   Other     SENSITIVE TO ANTIBIOTICS   Ciprofloxacin Nausea Only    Nausea    Doxazosin Nausea And Vomiting   Percocet [Oxycodone-Acetaminophen] Nausea And Vomiting    Social History:  reports that she has never smoked. She has never used smokeless tobacco. She reports that she does not drink alcohol and does not use drugs.  Family History: Family History  Problem Relation Age of Onset   Cancer Mother        COLON   Hypertension Father    Heart disease Father     Physical Exam: Vitals:   12/26/22 2345 12/27/22 0000 12/27/22 0015 12/27/22 0211  BP: (!) 161/82 (!) 158/83 (!) 158/72   Pulse: 84 85 87   Resp:      Temp:    98.4 F (36.9 C)  TempSrc:    Oral  SpO2: 97% 96% 95%   Weight:      Height:        General:  Alert and oriented times three, well developed and nourished, no acute distress Eyes: Pink conjunctiva, no scleral icterus ENT: Moist oral mucosa, neck supple, no thyromegaly Lungs: clear to ascultation, no wheeze, no crackles, no use of accessory muscles Cardiovascular: regular rate and rhythm, no regurgitation, no gallops, no murmurs. No carotid bruits, no JVD Abdomen: soft, positive BS, positive TTP,  not an acute abdomen GU: not examined Neuro: CN II - XII grossly intact, sensation intact Musculoskeletal:  strength 5/5 all extremities, no clubbing, cyanosis or edema Skin: no rash, no subcutaneous crepitation, no decubitus Psych: Very anxious patient  Labs on Admission:  Recent Labs    12/26/22 2308  NA 139  K 4.2  CL 107  CO2 13*  GLUCOSE 187*  BUN 17  CREATININE 1.25*  CALCIUM 9.0   Recent Labs    12/26/22 2308  AST 88*  ALT 86*  ALKPHOS 84  BILITOT 2.2*  PROT 7.4  ALBUMIN 4.2   Recent Labs    12/26/22 2308  LIPASE 61*   Recent Labs  12/26/22 2308  WBC 22.0*  NEUTROABS 19.1*  HGB 14.4  HCT 42.7  MCV 89.5  PLT 312    Radiological Exams on Admission: CT Angio Chest/Abd/Pel for Dissection W and/or W/WO  Result Date: 12/27/2022 CLINICAL DATA:  Abdominal pain EXAM: CT ANGIOGRAPHY CHEST, ABDOMEN AND PELVIS TECHNIQUE: Non-contrast CT of the chest was initially obtained. Multidetector CT imaging through the chest, abdomen and pelvis was performed using the standard protocol during bolus administration of intravenous contrast. Multiplanar reconstructed images and MIPs were obtained and reviewed to evaluate the vascular anatomy. RADIATION DOSE REDUCTION: This exam was performed according to the departmental dose-optimization program which includes automated exposure control, adjustment of the mA and/or kV according to patient size and/or use of iterative reconstruction technique. CONTRAST:  75mL OMNIPAQUE IOHEXOL 350 MG/ML SOLN COMPARISON:  CT abdomen/pelvis dated 01/27/2022 FINDINGS: CTA CHEST FINDINGS Cardiovascular: On unenhanced CT, there is no evidence of intramural hematoma. Following contrast administration, there is no evidence of thoracic aortic aneurysm or dissection. Mild atherosclerotic calcifications of the aortic arch. Although not tailored for evaluation of the pulmonary arteries, there is no evidence of pulmonary embolism to the lobar level. The heart is normal in size.  No pericardial effusion. Mediastinum/Nodes: No suspicious mediastinal lymphadenopathy.  Visualized thyroid is unremarkable. Lungs/Pleura: Lungs are essentially clear. Very mild scarring/atelectasis in the inferomedial right middle lobe and inferior lingula. No focal consolidation. No suspicious pulmonary nodules. No pleural effusion or pneumothorax. Musculoskeletal: Visualized osseous structures are within normal limits. Review of the MIP images confirms the above findings. CTA ABDOMEN AND PELVIS FINDINGS VASCULAR Aorta: No evidence of abdominal aortic aneurysm or dissection. Patent. Atherosclerotic calcifications. Celiac: Patent. SMA: Patent. Renals: Patent bilaterally. Atherosclerotic calcifications at the origin. IMA: Patent. Inflow: Patent bilaterally. Veins: Unremarkable. Review of the MIP images confirms the above findings. 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 Status post cholecystectomy. No intrahepatic or extrahepatic duct dilatation. Pancreas: Within normal limits. Spleen: Within normal limits. Adrenals/Urinary Tract: Adrenal glands are within normal limits. Bilateral renal cortical atrophy.  No hydronephrosis. Bladder is within normal limits. Stomach/Bowel: Stomach is within normal limits. No evidence of bowel obstruction. Appendix is not discretely visualized. Left colonic diverticulosis, without evidence of diverticulitis. Lymphatic: No suspicious abdominopelvic lymphadenopathy. Reproductive: Status post hysterectomy. Bilateral ovaries are within normal limits. Other: No abdominopelvic ascites. Musculoskeletal: Degenerative changes of the lumbar spine. Review of the MIP images confirms the above findings. IMPRESSION: No evidence of thoracoabdominal aortic aneurysm or dissection. No evidence of pulmonary embolism. No CT findings to account for the patient's abdominal pain. Additional ancillary findings as above. Electronically  Signed   By: Charline Bills M.D.   On: 12/27/2022 00:49    Assessment/Plan Present on Admission:  Gastroenteritis // lactic acidosis //abdominal pain -N.p.o., IV fluid hydration -Follow lactic acid level until normalized -Concern for ischemia.  CTA abdomen and pelvis without evidence of blockage. -C. difficile, GI pathogen panel ordered -Blood cultures x 2 ordered. -Patient with leukocytosis, but no evidence of infection.  Empiric cefepime, vancomycin and Flagyl given in ER.  Continue with IV Zosyn.  P.o. vancomycin.   Hyperbilirubinemia //elevated LFTs -Right upper quadrant ultrasound ordered. -CMP in a.m. -Hepatitis panel -GI consult placed -?  MRCP   Anxiety -As needed Ativan ordered -UDS ordered   Possible UTI -UA and culture collected, contaminated -Will order recollection -IV Zosyn initiated   Rhabdomyolysis -CK 3,672.  IV  fluid hydration at 125 cc an hour given patient's age.  Follow-up CK in a.m.   Chronic kidney disease, stage 3a (HCC) -Stable stable, avoid nephrotoxic medication, avoid nephrotoxic medication   Essential hypertension, benign -Home medications of Norvasc, metoprolol, losartan resumed   Hypercholesteremia -Statin on hold   T2DM -Sliding scale insulin every 4 hours   PAF -Metoprolol resumed.  Eliquis on hold.   Lumpectomy and radiation BRCA negative   Kathryn Wall 12/27/2022, 2:59 AM

## 2022-12-27 NOTE — Evaluation (Signed)
Physical Therapy Evaluation Patient Details Name: Kathryn Wall MRN: [id number] DOB: Jul 27, 1945 Today's Date: 12/27/2022  History of Present Illness  This is a 77 year old female with PMH of HTN, HLD, anxiety, T2DM, paroxysmal atrial fibrillation on Eliquis, breast cancer with lumpectomy and radiation BRCA negative.  Patient presents with complaints of nausea, vomiting, diarrhea and severe abdominal pain.  Per patient her stomach hurt all night periumbilical and suprapubic in location and she had continuous nausea vomiting.  She denies hematemesis..  She endorses subjective low-grade fevers and chills.  Symptoms started yesterday 12/26/22.   Clinical Impression  Patient received awake, confused. Oriented only to self. She is restless and anxious in bed. Patient is able to go from supine to sit with min A. Spontaneously returned herself to supine after 1-2 min. Declined further mobility at this time.  Patient will continue to benefit from skilled PT to improve safety and independence with mobility.          Assistance Recommended at Discharge Frequent or constant Supervision/Assistance  If plan is discharge home, recommend the following:  Can travel by private vehicle  A lot of help with walking and/or transfers;A little help with bathing/dressing/bathroom;Assistance with feeding;Assistance with cooking/housework;Help with stairs or ramp for entrance;Direct supervision/assist for financial management;Direct supervision/assist for medications management        Equipment Recommendations Rolling walker (2 wheels)  Recommendations for Other Services       Functional Status Assessment Patient has had a recent decline in their functional status and demonstrates the ability to make significant improvements in function in a reasonable and predictable amount of time.     Precautions / Restrictions Precautions Precautions: Fall Restrictions Weight Bearing Restrictions: No      Mobility   Bed Mobility Overal bed mobility: Needs Assistance Bed Mobility: Supine to Sit, Sit to Supine     Supine to sit: Min assist Sit to supine: Independent   General bed mobility comments: patient reached out for assist to sit up. Then self returned to supine. Could not get her to do any additional mobility at this time    Transfers                   General transfer comment: declined    Ambulation/Gait               General Gait Details: declined/confused  Stairs            Wheelchair Mobility     Tilt Bed    Modified Rankin (Stroke Patients Only)       Balance Overall balance assessment: Needs assistance Sitting-balance support: Feet supported, Bilateral upper extremity supported Sitting balance-Leahy Scale: Fair                                       Pertinent Vitals/Pain Pain Assessment Pain Assessment: Faces Faces Pain Scale: Hurts a little bit Pain Location: stomach Pain Descriptors / Indicators: Discomfort Pain Intervention(s): Monitored during session    Home Living Family/patient expects to be discharged to:: Unsure                   Additional Comments: patient is very confused and unable to give history    Prior Function                       Hand Dominance  Extremity/Trunk Assessment   Upper Extremity Assessment Upper Extremity Assessment: Defer to OT evaluation    Lower Extremity Assessment Lower Extremity Assessment: Difficult to assess due to impaired cognition       Communication   Communication: No difficulties  Cognition Arousal/Alertness: Awake/alert Behavior During Therapy: Anxious, Restless Overall Cognitive Status: No family/caregiver present to determine baseline cognitive functioning Area of Impairment: Orientation, Following commands, Safety/judgement, Awareness, Problem solving, Attention                 Orientation Level: Disoriented to, Place, Time,  Situation Current Attention Level: Focused   Following Commands: Follows one step commands inconsistently     Problem Solving: Requires verbal cues, Requires tactile cues          General Comments      Exercises     Assessment/Plan    PT Assessment Patient needs continued PT services  PT Problem List Decreased activity tolerance;Decreased balance;Pain;Decreased cognition;Decreased mobility;Decreased safety awareness       PT Treatment Interventions DME instruction;Gait training;Stair training;Functional mobility training;Neuromuscular re-education;Balance training;Therapeutic exercise;Therapeutic activities;Cognitive remediation;Patient/family education    PT Goals (Current goals can be found in the Care Plan section)  Acute Rehab PT Goals Patient Stated Goal: none stated PT Goal Formulation: Patient unable to participate in goal setting Time For Goal Achievement: 01/10/23    Frequency Min 3X/week     Co-evaluation               AM-PAC PT "6 Clicks" Mobility  Outcome Measure Help needed turning from your back to your side while in a flat bed without using bedrails?: None Help needed moving from lying on your back to sitting on the side of a flat bed without using bedrails?: A Little Help needed moving to and from a bed to a chair (including a wheelchair)?: A Lot Help needed standing up from a chair using your arms (e.g., wheelchair or bedside chair)?: A Lot Help needed to walk in hospital room?: Total Help needed climbing 3-5 steps with a railing? : Total 6 Click Score: 13    End of Session   Activity Tolerance: Other (comment) (limited by confusion) Patient left: in bed;with call bell/phone within reach;with bed alarm set Nurse Communication: Mobility status PT Visit Diagnosis: Other abnormalities of gait and mobility (R26.89);Pain Pain - part of body:  (stomach)    Time: 1610-9604 PT Time Calculation (min) (ACUTE ONLY): 8 min   Charges:   PT  Evaluation $PT Eval Low Complexity: 1 Low   PT General Charges $$ ACUTE PT VISIT: 1 Visit         Deseree Zemaitis, PT, GCS 12/27/22,12:51 PM

## 2022-12-28 DIAGNOSIS — E7800 Pure hypercholesterolemia, unspecified: Secondary | ICD-10-CM

## 2022-12-28 DIAGNOSIS — I10 Essential (primary) hypertension: Secondary | ICD-10-CM

## 2022-12-28 DIAGNOSIS — N1831 Chronic kidney disease, stage 3a: Secondary | ICD-10-CM

## 2022-12-28 LAB — COMPREHENSIVE METABOLIC PANEL
ALT: 82 U/L — ABNORMAL HIGH (ref 0–44)
AST: 160 U/L — ABNORMAL HIGH (ref 15–41)
Albumin: 3.4 g/dL — ABNORMAL LOW (ref 3.5–5.0)
Alkaline Phosphatase: 65 U/L (ref 38–126)
Anion gap: 13 (ref 5–15)
BUN: 15 mg/dL (ref 8–23)
CO2: 17 mmol/L — ABNORMAL LOW (ref 22–32)
Calcium: 8.5 mg/dL — ABNORMAL LOW (ref 8.9–10.3)
Chloride: 108 mmol/L (ref 98–111)
Creatinine, Ser: 0.95 mg/dL (ref 0.44–1.00)
GFR, Estimated: >60 mL/min (ref >60)
Glucose, Bld: 89 mg/dL (ref 70–99)
Potassium: 3.5 mmol/L (ref 3.5–5.1)
Sodium: 138 mmol/L (ref 135–145)
Total Bilirubin: 0.9 mg/dL (ref 0.3–1.2)
Total Protein: 6.1 g/dL — ABNORMAL LOW (ref 6.5–8.1)

## 2022-12-28 LAB — GLUCOSE, CAPILLARY
Glucose-Capillary: 92 mg/dL (ref 70–99)
Glucose-Capillary: 124 mg/dL — ABNORMAL HIGH (ref 70–99)
Glucose-Capillary: 105 mg/dL — ABNORMAL HIGH (ref 70–99)
Glucose-Capillary: 96 mg/dL (ref 70–99)
Glucose-Capillary: 114 mg/dL — ABNORMAL HIGH (ref 70–99)

## 2022-12-28 LAB — CBC
HCT: 40.2 % (ref 36.0–46.0)
Hemoglobin: 12.9 g/dL (ref 12.0–15.0)
MCH: 30.7 pg (ref 26.0–34.0)
MCHC: 32.1 g/dL (ref 30.0–36.0)
MCV: 95.7 fL (ref 80.0–100.0)
Platelets: 223 10*3/uL (ref 150–400)
RBC: 4.2 MIL/uL (ref 3.87–5.11)
RDW: 14.5 % (ref 11.5–15.5)
WBC: 18.4 10*3/uL — ABNORMAL HIGH (ref 4.0–10.5)
nRBC: 0 % (ref 0.0–0.2)

## 2022-12-28 LAB — CK: Total CK: 3143 U/L — ABNORMAL HIGH (ref 38–234)

## 2022-12-28 NOTE — TOC Initial Note (Signed)
Transition of Care [facility]) - Initial/Assessment Note    Patient Details  Name: Kathryn Wall MRN: [id number] Date of Birth: 1946/05/16  Transition of Care [facility]) CM/SW Contact:    Mearl Latin, LCSW Phone Number: 12/28/2022, 3:13 PM  Clinical Narrative:                 CSW received consult for possible SNF placement at time of discharge. CSW spoke with patient's daughter, Lynden Ang. She reported that patient lives alone and family is currently unable to care for patient given patient's current physical needs and fall risk. She expressed understanding of PT recommendation and is agreeable to SNF placement at time of discharge. She reports preference for [facility] as patient has been there before. CSW discussed insurance authorization process and will provide Medicare SNF ratings list. CSW will send out referrals for review and provide bed offers as available.   [facility] is checking to see if they will have a bed opening.   Skilled Nursing Rehab Facilities-   [email or web address]   Ratings out of 5 stars (5 the highest)   Name Address  Phone # Quality Care Staffing Health Inspection Overall  [facility] [address] [phone] 2 1 5 4   [facility] [address], [place] [phone] 4 1 3 2   [facility] [address], [place]in[place] Otto [phone] [facility] [address] [place] [phone] 4 1 3 2   [facility]  [address], [place] [phone] 3 2 5 5   [facility] [address] [facility] [phone] 2 1 2 1   [facility] [address], [place] [phone] 4 1 2 1   [facility] [address] [place] [phone] 2 4 3 3   [address] ([facility]) [address], [place] [phone] 3 2 2 2   [facility] [address] [place], [place] [phone] 1 2 1 1   [facility] ([place] [address], [place] [phone] 3 1  1 1   [facility] [address] Liliane Shi [phone] 4 3 4 4   [facility] [address], [place] [phone] 3 4 3 3           [facility] [address] [place] [phone]      [facility], [place] [address], [place] [phone] 1 1 2 1   [facility] [address], [place] [phone] 2 2 4 4   [facility] [address] [phone] 2 1 4 3   [facility] [address] [place] [phone] 3 3 3 3           [address] (no [facility]) [address], [place] [phone] 4 4 5 5   [facility] (No Hum[employer]) [address] Lavera Guise [phone] 2 2 4 4   [facility] [address], [place] [phone] 2 1 2 1   [facility] (No [employer]) [address], [place] [phone] 5 5 5 5   [facility] [address], [facility] [phone] 2 3 5 5   [facility] [address] [place] [phone] 2 1 1 1   [facility] [address] Liliane Shi [phone] 5 2 5 5   [facility]  [address] [place] [phone] 2 2 2 2   [facility] [address] [place] [phone] 4 2 1 1   [facility] [address] [place], [place] [phone] 2 2 3 3           [facility] [address], [place] [phone] 1 1 1 1   [facility] [address], Evlyn Clines  [phone] 2 3 3 3   [facility] (No Humana) [address], [place] [phone] 2 1 3 2   [facility]) [address], [place] Levan [phone] 1 1 1 1   [facility] [address] [place] Levan [phone] 3 2 5 5   [facility] [address], [place] [phone] 2 1 1 1           [facility] [address] [place], [place] [phone] 4 4 5 5   [facility])  [address], [place] [phone] 2 1 2 1   [facility]) [address] [place] [phone]  1 4 3   [facility] [address] [place] [phone] 3 5 4 5   [address] [place], [place] [phone] 3 2 2 2   [facility] [address] [facility]) [address] [place] [phone] 2 1 3 2      Expected Discharge Plan: Skilled Nursing Facility Barriers to Discharge: Continued Medical Work up, English as a second language teacher, SNF Pending bed offer   Patient Goals and CMS Choice Patient states their goals for this hospitalization and ongoing recovery are:: Rehab CMS Medicare.gov Compare Post Acute Care list provided to:: Patient Represenative (must comment) Choice offered to / list presented to : Adult Children [place] ownership interest in [facility].provided to:: Adult Children    Expected Discharge Plan and Services In-house Referral: Clinical Social Work   Post Acute Care Choice: Skilled Nursing Facility Living arrangements for the past 2 months: Single Family Home                                      Prior Living Arrangements/Services Living arrangements for the past 2 months: Single Family Home Lives with:: Self Patient language and need for interpreter reviewed:: Yes Do you feel safe going back to the place where you live?: Yes      Need for Family Participation in Patient Care: Yes (Comment) Care giver support system in place?: Yes (comment)   Criminal Activity/Legal Involvement Pertinent to Current Situation/Hospitalization: No - Comment as needed  Activities of Daily Living      Permission Sought/Granted Permission sought to share information with : Family Supports Permission granted to share information with : Yes, Verbal Permission Granted  Share Information with NAME: Lynden Ang  Permission granted to share info w AGENCY: SNFs  Permission granted to share info w Relationship: Daughter  Permission granted to share info w Contact Information: [phone]  Emotional Assessment Appearance:: Appears stated age Attitude/Demeanor/Rapport: Unable to  Assess Affect (typically observed): Unable to Assess Orientation: : Oriented to Self, Oriented to Place, Oriented to Situation Alcohol / Substance Use: Not Applicable Psych Involvement: No (comment)  Admission diagnosis:  Lactic acidosis [E87.20] Gastroenteritis [K52.9] High anion gap metabolic acidosis [E87.29] Vomiting and diarrhea [R11.10, R19.7] Patient Active Problem List   Diagnosis Date Noted   Gastroenteritis 12/27/2022   T2DM (type 2 diabetes mellitus) (HCC) 12/27/2022   History of breast cancer 12/27/2022   Post concussion syndrome 03/23/2022   Open wound of foot 01/09/2022   Generalized weakness 12/31/2020   Fall at home, initial encounter 12/31/2020   Pneumonia of right lower lobe due to infectious organism 12/31/2020   Mixed diabetic hyperlipidemia associated with type 2 diabetes mellitus (HCC) 12/31/2020   GERD without  esophagitis 12/31/2020   SIRS (systemic inflammatory response syndrome) (HCC) 12/31/2020   Acute diverticulitis 12/31/2020   Weakness 12/31/2020   Osteopenia of femoral neck, bilateral 10/15/2016   Breast cancer of lower-outer quadrant of right female breast (HCC) 04/02/2014   Chronic kidney disease, stage 3a (HCC)    Essential hypertension, benign 08/14/2012   Hypercholesteremia 08/14/2012   Type 2 diabetes mellitus with stage 3a chronic kidney disease, without long-term current use of insulin (HCC) 08/14/2012   Pyelonephritis 05/29/2011   Constipation 05/29/2011   AKI (acute kidney injury) (HCC) 05/29/2011   PCP:  Donita Brooks, MD Pharmacy:   [facility] [place], [place] - [address] [place] [address] Phone: [phone] Fax: [phone]     Social Determinants of Health (SDOH) Social History: SDOH Screenings   Food Insecurity: No Food Insecurity (03/24/2022)  Housing: Low Risk  (03/24/2022)  Transportation Needs: No Transportation Needs (03/24/2022)  Utilities: Not At Risk (03/24/2022)   Alcohol Screen: Low Risk  (11/22/2019)  Depression (PHQ2-9): Low Risk  (08/05/2022)  Tobacco Use: Low Risk  (12/26/2022)   SDOH Interventions:     Readmission Risk Interventions     No data to display

## 2022-12-28 NOTE — [place] FL2 (Signed)
[place] MEDICAID FL2 LEVEL OF CARE FORM     IDENTIFICATION  Patient Name: Kathryn Wall Birthdate: June 19, 1946 Sex: female Admission Date (Current Location): 12/26/2022  [facility] and [place] Number:  Producer, television/film/video and Address:  [facility] [place], [address], [place], [place] [address]      Provider Number: [id number]  Attending Physician Name and Address:  Maretta Bees, MD  Relative Name and Phone Number:       Current Level of Care: Hospital Recommended Level of Care: Skilled Nursing Facility Prior Approval Number:    Date Approved/Denied:   PASRR Number: [id number]  Discharge Plan: SNF    Current Diagnoses: Patient Active Problem List   Diagnosis Date Noted   Gastroenteritis 12/27/2022   T2DM (type 2 diabetes mellitus) (HCC) 12/27/2022   History of breast cancer 12/27/2022   Post concussion syndrome 03/23/2022   Open wound of foot 01/09/2022   Generalized weakness 12/31/2020   Fall at home, initial encounter 12/31/2020   Pneumonia of right lower lobe due to infectious organism 12/31/2020   Mixed diabetic hyperlipidemia associated with type 2 diabetes mellitus (HCC) 12/31/2020   GERD without esophagitis 12/31/2020   SIRS (systemic inflammatory response syndrome) (HCC) 12/31/2020   Acute diverticulitis 12/31/2020   Weakness 12/31/2020   Osteopenia of femoral neck, bilateral 10/15/2016   Breast cancer of lower-outer quadrant of right female breast (HCC) 04/02/2014   Chronic kidney disease, stage 3a (HCC)    Essential hypertension, benign 08/14/2012   Hypercholesteremia 08/14/2012   Type 2 diabetes mellitus with stage 3a chronic kidney disease, without long-term current use of insulin (HCC) 08/14/2012   Pyelonephritis 05/29/2011   Constipation 05/29/2011   AKI (acute kidney injury) (HCC) 05/29/2011    Orientation RESPIRATION BLADDER Height & Weight     Self, Situation  Normal Incontinent Weight: 165 lb 5.5 oz (75  kg) Height:  5\' 5"  (165.1 cm)  BEHAVIORAL SYMPTOMS/MOOD NEUROLOGICAL BOWEL NUTRITION STATUS      Continent Diet (See dc summary)  AMBULATORY STATUS COMMUNICATION OF NEEDS Skin   Extensive Assist Verbally Normal                       Personal Care Assistance Level of Assistance  Bathing, Feeding, Dressing Bathing Assistance: Maximum assistance Feeding assistance: Limited assistance Dressing Assistance: Limited assistance     Functional Limitations Info  Sight Sight Info: Impaired        SPECIAL CARE FACTORS FREQUENCY  PT (By licensed PT), OT (By licensed OT)     PT Frequency: 5x/week OT Frequency: 5x/week            Contractures Contractures Info: Not present    Additional Factors Info  Code Status, Allergies, Psychotropic Code Status Info: Full Allergies Info: Ambien (Zolpidem Tartrate), Cymbalta (Duloxetine Hcl), Other, Ciprofloxacin, Doxazosin, Percocet (Oxycodone-acetaminophen) Psychotropic Info: See dc summary         Current Medications (12/28/2022):  This is the current hospital active medication list Current Facility-Administered Medications  Medication Dose Route Frequency Provider Last Rate Last Admin   0.9 %  sodium chloride infusion   Intravenous Continuous Ghimire, Werner Lean, MD 75 mL/hr at 12/28/22 0800 Rate Verify at 12/28/22 0800   amLODipine (NORVASC) tablet 5 mg  5 mg Oral Daily Crosley, Debby, MD   5 mg at 12/28/22 0841   apixaban (ELIQUIS) tablet 5 mg  5 mg Oral BID Maretta Bees, MD   5 mg at 12/28/22 [id number]  cyanocobalamin (VITAMIN B12) tablet 1,000 mcg  1,000 mcg Oral Daily Maretta Bees, MD   1,000 mcg at 12/28/22 0841   hydrALAZINE (APRESOLINE) injection 5 mg  5 mg Intravenous Q4H PRN Crosley, Debby, MD       HYDROmorphone (DILAUDID) injection 1 mg  1 mg Intravenous Q4H PRN Crosley, Debby, MD   1 mg at 12/27/22 2114   insulin aspart (novoLOG) injection 0-9 Units  0-9 Units Subcutaneous TID WC Maretta Bees, MD   1 Units at  12/27/22 1729   LORazepam (ATIVAN) injection 0.5 mg  0.5 mg Intravenous Q6H PRN Crosley, Debby, MD   0.5 mg at 12/28/22 0331   losartan (COZAAR) tablet 50 mg  50 mg Oral Daily Crosley, Debby, MD   50 mg at 12/28/22 0841   metoprolol succinate (TOPROL-XL) 24 hr tablet 25 mg  25 mg Oral Daily Crosley, Debby, MD   25 mg at 12/28/22 0841   mirabegron ER (MYRBETRIQ) tablet 25 mg  25 mg Oral Daily Maretta Bees, MD   25 mg at 12/28/22 0845   ondansetron (ZOFRAN) tablet 4 mg  4 mg Oral Q6H PRN Crosley, Debby, MD       Or   ondansetron (ZOFRAN) injection 4 mg  4 mg Intravenous Q6H PRN Crosley, Debby, MD   4 mg at 12/27/22 1319   pantoprazole (PROTONIX) EC tablet 40 mg  40 mg Oral q morning Ghimire, Werner Lean, MD   40 mg at 12/28/22 0840   PARoxetine (PAXIL) tablet 20 mg  20 mg Oral Daily Crosley, Debby, MD   20 mg at 12/28/22 0845   QUEtiapine (SEROQUEL) tablet 25 mg  25 mg Oral QHS Crosley, Debby, MD   25 mg at 12/27/22 2114   venlafaxine XR (EFFEXOR-XR) 24 hr capsule 75 mg  75 mg Oral Q breakfast Gery Pray, MD   75 mg at 12/28/22 6962     Discharge Medications: Please see discharge summary for a list of discharge medications.  Relevant Imaging Results:  Relevant Lab Results:   Additional Information SSN: [id number].  Mearl Latin, LCSW

## 2022-12-28 NOTE — Evaluation (Signed)
Occupational Therapy Evaluation Patient Details Name: Kathryn Wall MRN: [id number] DOB: September 14, 1945 Today's Date: 12/28/2022   History of Present Illness This is a 77 year old female with PMH of HTN, HLD, anxiety, T2DM, paroxysmal atrial fibrillation on Eliquis, breast cancer with lumpectomy and radiation BRCA negative.  Patient presents with complaints of nausea, vomiting, diarrhea and severe abdominal pain.  Per patient her stomach hurt all night periumbilical and suprapubic in location and she had continuous nausea vomiting.  She denies hematemesis..  She endorses subjective low-grade fevers and chills.  Symptoms started yesterday   Clinical Impression   Pt s/p above diagnosis. Pt currently A/O x2, oriented to situation/self, asked about date/location at beginning/end of session, significant difficulty with date, still believes she is at [facility] after repeated reminders. Pt states she lives alone and does not have consistent support at home. Pt currently requires significant assistance for all OOB activities, not able to transfer or ambulate without min A for balance, several instances of LOB requiring physical assistance to recover. Overall, Pt strength, endurance, activity tolerance seems good, would benefit greatly from postacute intensive therapy to quickly improve function and continue to monitor cognition for further DC needs, but is currently not able to return home safely without 24/7 support due to high fall risk and poor safety awareness, poor knowledge of DME use, and decreased cognition. Pt to be seen acutely during stay to maximize function and monitor progress.     Recommendations for follow up therapy are one component of a multi-disciplinary discharge planning process, led by the attending physician.  Recommendations may be updated based on patient status, additional functional criteria and insurance authorization.   Assistance Recommended at Discharge Frequent or  constant Supervision/Assistance  Patient can return home with the following A lot of help with walking and/or transfers;A lot of help with bathing/dressing/bathroom;Assistance with cooking/housework;Direct supervision/assist for medications management;Direct supervision/assist for financial management;Assist for transportation;Help with stairs or ramp for entrance    Functional Status Assessment  Patient has had a recent decline in their functional status and demonstrates the ability to make significant improvements in function in a reasonable and predictable amount of time.  Equipment Recommendations  Other (comment) (defer)    Recommendations for Other Services Rehab consult     Precautions / Restrictions Precautions Precautions: Fall Precaution Comments: impulsive, poor standing balance even with RW Restrictions Weight Bearing Restrictions: No      Mobility Bed Mobility Overal bed mobility: Needs Assistance Bed Mobility: Supine to Sit, Sit to Supine     Supine to sit: Min assist Sit to supine: Supervision   General bed mobility comments: requires HHA supine to sit, supervision for safety with return to supine due to impulsive and decreased safety awareness    Transfers Overall transfer level: Needs assistance Equipment used: Rolling walker (2 wheels) Transfers: Sit to/from Stand, Bed to chair/wheelchair/BSC Sit to Stand: Min assist     Step pivot transfers: Min assist     General transfer comment: min A to recover balance with STS and transfer      Balance Overall balance assessment: Needs assistance Sitting-balance support: Feet supported Sitting balance-Leahy Scale: Fair Sitting balance - Comments: able to perform EOB ADLs, supervision for safety   Standing balance support: During functional activity, Reliant on assistive device for balance Standing balance-Leahy Scale: Poor Standing balance comment: poor balance, multiple instances of LOB, requires min A for  balance  ADL either performed or assessed with clinical judgement   ADL Overall ADL's : Needs assistance/impaired Eating/Feeding: Independent   Grooming: Set up;Sitting   Upper Body Bathing: Min guard;Sitting   Lower Body Bathing: Min guard;Sitting/lateral leans   Upper Body Dressing : Min guard;Sitting   Lower Body Dressing: Minimal assistance;Sit to/from stand   Toilet Transfer: Minimal assistance;Regular Toilet;Rolling walker (2 wheels);Grab bars Toilet Transfer Details (indicate cue type and reason): poor balance with standing, not able to control descent to sitting. Toileting- Architect and Hygiene: Min guard;Sitting/lateral lean   Tub/ Shower Transfer: Minimal assistance;Shower seat;Rolling walker (2 wheels);Grab bars   Functional mobility during ADLs: Min guard General ADL Comments: requires min guard - min A due to poor balance with standing/transfers and frequent LOB, not able to recover balance without physical assistance     Vision Baseline Vision/History: 1 Wears glasses Ability to See in Adequate Light: 0 Adequate Patient Visual Report: No change from baseline       Perception     Praxis      Pertinent Vitals/Pain Pain Assessment Pain Assessment: No/denies pain     Hand Dominance Right   Extremity/Trunk Assessment Upper Extremity Assessment Upper Extremity Assessment: Overall WFL for tasks assessed           Communication Communication Communication: No difficulties   Cognition Arousal/Alertness: Awake/alert Behavior During Therapy: Impulsive Overall Cognitive Status: No family/caregiver present to determine baseline cognitive functioning Area of Impairment: Orientation, Following commands, Safety/judgement, Awareness, Problem solving, Attention                 Orientation Level: Place, Time Current Attention Level: Focused   Following Commands: Follows one step commands  inconsistently Safety/Judgement: Decreased awareness of safety, Decreased awareness of deficits Awareness: Emergent Problem Solving: Requires verbal cues, Requires tactile cues General Comments: Pt impulsive to stand, decreased safety awarenss for hand placement with trasnfers, on RW, poor balance, not oriented to place/time.     General Comments       Exercises     Shoulder Instructions      Home Living                                   Additional Comments: Pt is confused, A/O x2, poor historian, states she lives alone and has two daughters who can help after work. States she lives in 1 story home, ramp, uses RW, has walk in shower w/seat.      Prior Functioning/Environment Prior Level of Function : Patient poor historian/Family not available             Mobility Comments: Pt states uses Rollator vs cane ADLs Comments: Pt states MOD I for ADLs, IADLs, driving and grocery shopping        OT Problem List: Decreased strength;Decreased activity tolerance;Impaired balance (sitting and/or standing);Decreased safety awareness;Decreased knowledge of use of DME or AE;Decreased knowledge of precautions;Decreased cognition      OT Treatment/Interventions: Self-care/ADL training;Therapeutic exercise;Energy conservation;DME and/or AE instruction;Therapeutic activities;Patient/family education;Cognitive remediation/compensation    OT Goals(Current goals can be found in the care plan section) Acute Rehab OT Goals Patient Stated Goal: Pt has difficulty with goal setting due to increased confusion OT Goal Formulation: With patient Time For Goal Achievement: 01/11/23 Potential to Achieve Goals: Good  OT Frequency: Min 2X/week    Co-evaluation              AM-PAC OT "6 Clicks" Daily  Activity     Outcome Measure Help from another person eating meals?: None Help from another person taking care of personal grooming?: A Little Help from another person toileting,  which includes using toliet, bedpan, or urinal?: A Little Help from another person bathing (including washing, rinsing, drying)?: A Little Help from another person to put on and taking off regular upper body clothing?: A Little Help from another person to put on and taking off regular lower body clothing?: A Little 6 Click Score: 19   End of Session Equipment Utilized During Treatment: Rolling walker (2 wheels);Gait belt Nurse Communication: Mobility status  Activity Tolerance: Patient tolerated treatment well Patient left: in bed;with call bell/phone within reach;with bed alarm set  OT Visit Diagnosis: Unsteadiness on feet (R26.81);Other abnormalities of gait and mobility (R26.89);Muscle weakness (generalized) (M62.81);History of falling (Z91.81);Pain;Other symptoms and signs involving cognitive function Pain - part of body:  (abd pain)                Time: 1610-9604 OT Time Calculation (min): 27 min Charges:  OT General Charges $OT Visit: 1 Visit OT Evaluation $OT Eval Moderate Complexity: 1 Mod OT Treatments $Self Care/Home Management : 8-22 mins  [place], OTR/L   Alexis Goodell 12/28/2022, 11:30 AM

## 2022-12-28 NOTE — Progress Notes (Signed)
PROGRESS NOTE        PATIENT DETAILS Name: Kathryn Wall Age: 77 y.o. Sex: female Date of Birth: 1946-06-18 Admit Date: 12/26/2022 Admitting Physician Dewayne Shorter Levora Dredge, MD [id number], Priscille Heidelberg, MD  Brief Summary: Patient is a 77 y.o.  female with history of HTN, HLD, DM-2, PAF on Eliquis, breast cancer-s/p lumpectomy/radiation-presented with abdominal pain, along with nausea, vomiting and diarrhea.  Significant events: 6/30>> admit to [facility]  Significant studies: 7/1>> CT angio chest abdomen/pelvis: No evidence of aneurysm/dissection/PE.  No acute findings. 7/1>> RUQ ultrasound: No significant abnormality 7/1>> acute hepatitis serology: Negative  Significant microbiology data: 7/1>> blood culture: Pending 7/1>> urine culture: Gram-negative rod  Procedures: None  Consults: GI  Subjective: No diarrhea-no abdominal pain-no vomiting.  Answering almost all my questions appropriately.  Apparently had some confusion overnight-seems relatively with it this morning.  Objective: Vitals: Blood pressure 128/67, pulse 62, temperature 98.1 F (36.7 C), temperature source Oral, resp. rate 17, height 5\' 5"  (1.651 m), weight 75 kg, SpO2 93 %.   Exam: Gen Exam:Alert awake-not in any distress HEENT:atraumatic, normocephalic Chest: B/L clear to auscultation anteriorly CVS:S1S2 regular Abdomen:soft non tender, non distended Extremities:no edema Neurology: Non focal Skin: no rash  Pertinent Labs/Radiology:    Latest Ref Rng & Units 12/28/2022    3:34 AM 12/27/2022    4:12 AM 12/26/2022   11:08 PM  CBC  WBC 4.0 - 10.5 K/uL 18.4  17.1  22.0   Hemoglobin 12.0 - 15.0 g/dL 62.1  30.8  65.7   Hematocrit 36.0 - 46.0 % 40.2  39.3  42.7   Platelets 150 - 400 K/uL 223  253  312     Lab Results  Component Value Date   NA 138 12/28/2022   K 3.5 12/28/2022   CL 108 12/28/2022   CO2 17 (L) 12/28/2022    Assessment/Plan: Abdominal pain with  nausea/vomiting/diarrhea ?  Viral gastroenteritis Abdominal pain/nausea/vomiting and diarrhea have all resolved. CT imaging nonacute  Transaminitis Probably reflective of viral gastroenteritis-mild rhabdomyolysis CT abdomen/RUQ ultrasound nonacute (s/p cholecystectomy) Continue to trend LFTs.  Leukocytosis Continues to have persistent leukocytosis-?  Reactive to gastroenteritis  Repeat blood cultures today Unclear significance of urine cultures-as she really does not have any symptoms at this point.  Rhabdomyolysis Mild Nontraumatic Slowly downtrending-continue to trend CK-remains on IVF.  Delirium Reviewed prior notes-has seen outpatient neurology-suspicion for some chronic cognitive issues at baseline Per family-she was significantly confused yesterday-and less so today.  Long conversation with daughter Marzetta Merino that she probably has an delirium-low suspicion for any other etiology at this point.  Per daughter-her confusion is significantly less today. Plan is to monitor closely-and pursue further workup if she does not improve.   CKD stage IIIa At baseline  Asymptomatic bacteriuria Does not have any symptoms consistent with UTI See above.  HLD Statin on hold until rhabdomyolysis improves further  HTN BP stable Norvasc/metoprolol/losartan  PAF Telemetry monitoring Metoprolol Continue Eliquis.  Type II DM CBG stable with SSI Resume oral hypoglycemic agents on discharge  Recent Labs    12/27/22 1640 12/27/22 2120 12/28/22 0816  GLUCAP 127* 120* 92     Chronic anxiety/depression Continue Paxil/Seroquel/venlafaxine  GERD PPI  BMI: Estimated body mass index is 27.51 kg/m as calculated from the following:   Height as of this encounter: 5\' 5"  (1.651 m).   Weight as of  this encounter: 75 kg.   Code status:   Code Status: Full Code   DVT Prophylaxis: apixaban (ELIQUIS) tablet 5 mg    Family Communication: Spoke with daughter Olegario Messier [phone]  7/2   Disposition Plan: Status is: Observation The patient will require care spanning > 2 midnights and should be moved to inpatient because: Severity of illness   Planned Discharge Destination:Home   Diet: Diet Order             Diet full liquid Room service appropriate? Yes with Assist; Fluid consistency: Thin  Diet effective now                     Antimicrobial agents: Anti-infectives (From admission, onward)    Start     Dose/Rate Route Frequency Ordered Stop   12/28/22 0045  vancomycin (VANCOCIN) IVPB 1000 mg/200 mL premix  Status:  Discontinued       See Hyperspace for full Linked Orders Report.   1,000 mg 200 mL/hr over 60 Minutes Intravenous Every 24 hours 12/27/22 0036 12/27/22 0641   12/27/22 0745  piperacillin-tazobactam (ZOSYN) IVPB 3.375 g  Status:  Discontinued        3.375 g 12.5 mL/hr over 240 Minutes Intravenous Every 8 hours 12/27/22 0648 12/27/22 1149   12/27/22 0045  vancomycin (VANCOREADY) IVPB 1500 mg/300 mL       See Hyperspace for full Linked Orders Report.   1,500 mg 150 mL/hr over 120 Minutes Intravenous  Once 12/27/22 0036 12/27/22 0435   12/27/22 0045  ceFEPIme (MAXIPIME) 2 g in sodium chloride 0.9 % 100 mL IVPB  Status:  Discontinued        2 g 200 mL/hr over 30 Minutes Intravenous Every 12 hours 12/27/22 0036 12/27/22 0641   12/27/22 0030  metroNIDAZOLE (FLAGYL) IVPB 500 mg  Status:  Discontinued        500 mg 100 mL/hr over 60 Minutes Intravenous Every 12 hours 12/27/22 0029 12/27/22 0641        MEDICATIONS: Scheduled Meds:  amLODipine  5 mg Oral Daily   apixaban  5 mg Oral BID   cyanocobalamin  1,000 mcg Oral Daily   insulin aspart  0-9 Units Subcutaneous TID WC   losartan  50 mg Oral Daily   metoprolol succinate  25 mg Oral Daily   mirabegron ER  25 mg Oral Daily   pantoprazole  40 mg Oral q morning   PARoxetine  20 mg Oral Daily   QUEtiapine  25 mg Oral QHS   venlafaxine XR  75 mg Oral Q breakfast   Continuous  Infusions:  sodium chloride 75 mL/hr at 12/27/22 1229   PRN Meds:.hydrALAZINE, HYDROmorphone (DILAUDID) injection, LORazepam, ondansetron **OR** ondansetron (ZOFRAN) IV   I have personally reviewed following labs and imaging studies  LABORATORY DATA: CBC: Recent Labs  Lab 12/26/22 2308 12/27/22 0412 12/28/22 0334  WBC 22.0* 17.1* 18.4*  NEUTROABS 19.1* 14.4*  --   HGB 14.4 12.9 12.9  HCT 42.7 39.3 40.2  MCV 89.5 91.2 95.7  PLT 312 253 223     Basic Metabolic Panel: Recent Labs  Lab 12/26/22 2308 12/27/22 0412 12/28/22 0334  NA 139 140 138  K 4.2 3.9 3.5  CL 107 107 108  CO2 13* 15* 17*  GLUCOSE 187* 168* 89  BUN 17 15 15   CREATININE 1.25* 1.05* 0.95  CALCIUM 9.0 8.6* 8.5*     GFR: Estimated Creatinine Clearance: 49.5 mL/min (by C-G formula based on SCr of  0.95 mg/dL).  Liver Function Tests: Recent Labs  Lab 12/26/22 2308 12/27/22 0412 12/28/22 0334  AST 88* 116* 160*  ALT 86* 85* 82*  ALKPHOS 84 77 65  BILITOT 2.2* 2.0* 0.9  PROT 7.4 6.6 6.1*  ALBUMIN 4.2 3.8 3.4*    Recent Labs  Lab 12/26/22 2308  LIPASE 61*    No results for input(s): "AMMONIA" in the last 168 hours.  Coagulation Profile: Recent Labs  Lab 12/27/22 0045  INR 1.4*     Cardiac Enzymes: Recent Labs  Lab 12/27/22 0412 12/28/22 0334  CKTOTAL 3,672* 3,143*     BNP (last 3 results) No results for input(s): "PROBNP" in the last 8760 hours.  Lipid Profile: No results for input(s): "CHOL", "HDL", "LDLCALC", "TRIG", "CHOLHDL", "LDLDIRECT" in the last 72 hours.  Thyroid Function Tests: No results for input(s): "TSH", "T4TOTAL", "FREET4", "T3FREE", "THYROIDAB" in the last 72 hours.  Anemia Panel: No results for input(s): "VITAMINB12", "FOLATE", "FERRITIN", "TIBC", "IRON", "RETICCTPCT" in the last 72 hours.  Urine analysis:    Component Value Date/Time   COLORURINE YELLOW 12/27/2022 0051   APPEARANCEUR HAZY (A) 12/27/2022 0051   LABSPEC 1.023 12/27/2022 0051    PHURINE 6.0 12/27/2022 0051   GLUCOSEU >=500 (A) 12/27/2022 0051   HGBUR LARGE (A) 12/27/2022 0051   BILIRUBINUR NEGATIVE 12/27/2022 0051   KETONESUR 20 (A) 12/27/2022 0051   PROTEINUR 30 (A) 12/27/2022 0051   UROBILINOGEN 0.2 01/13/2015 1201   NITRITE NEGATIVE 12/27/2022 0051   LEUKOCYTESUR MODERATE (A) 12/27/2022 0051    Sepsis Labs: Lactic Acid, Venous    Component Value Date/Time   LATICACIDVEN 1.4 12/27/2022 0805    MICROBIOLOGY: Recent Results (from the past 240 hour(s))  Urine Culture     Status: Abnormal (Preliminary result)   Collection Time: 12/27/22 12:51 AM   Specimen: Urine, Clean Catch  Result Value Ref Range Status   Specimen Description URINE, CLEAN CATCH  Final   Special Requests   Final    NONE Performed at [facility], [address], [place], [place] [address]    Culture >=100,000 COLONIES/mL GRAM NEGATIVE RODS (A)  Final   Report Status PENDING  Incomplete    RADIOLOGY STUDIES/RESULTS: US Abdomen Limited RUQ (LIVER/GB)  Result Date: 12/27/2022 CLINICAL DATA:  Elevated liver function tests EXAM: ULTRASOUND ABDOMEN LIMITED RIGHT UPPER QUADRANT COMPARISON:  12/27/2022 CT chest abdomen pelvis FINDINGS: Gallbladder: Surgically absent Common bile duct: Diameter: 3 mm Liver: Parenchymal echogenicity: Within normal limits Contours: Normal Lesions: None. The lesion seen in the right hepatic lobe on the CT is not well visualized on ultrasound. Portal vein: Patent.  Hepatopetal flow Other: None. IMPRESSION: No significant sonographic abnormality of the liver. Electronically Signed   By: Acquanetta Belling M.D.   On: 12/27/2022 08:07   CT Angio Chest/Abd/Pel for Dissection W and/or W/WO  Result Date: 12/27/2022 CLINICAL DATA:  Abdominal pain EXAM: CT ANGIOGRAPHY CHEST, ABDOMEN AND PELVIS TECHNIQUE: Non-contrast CT of the chest was initially obtained. Multidetector CT imaging through the chest, abdomen and pelvis was performed using the standard protocol during bolus  administration of intravenous contrast. Multiplanar reconstructed images and MIPs were obtained and reviewed to evaluate the vascular anatomy. RADIATION DOSE REDUCTION: This exam was performed according to the departmental dose-optimization program which includes automated exposure control, adjustment of the mA and/or kV according to patient size and/or use of iterative reconstruction technique. CONTRAST:  75mL OMNIPAQUE IOHEXOL 350 MG/ML SOLN COMPARISON:  CT abdomen/pelvis dated 01/27/2022 FINDINGS: CTA CHEST FINDINGS Cardiovascular:  On unenhanced CT, there is no evidence of intramural hematoma. Following contrast administration, there is no evidence of thoracic aortic aneurysm or dissection. Mild atherosclerotic calcifications of the aortic arch. Although not tailored for evaluation of the pulmonary arteries, there is no evidence of pulmonary embolism to the lobar level. The heart is normal in size.  No pericardial effusion. Mediastinum/Nodes: No suspicious mediastinal lymphadenopathy. Visualized thyroid is unremarkable. Lungs/Pleura: Lungs are essentially clear. Very mild scarring/atelectasis in the inferomedial right middle lobe and inferior lingula. No focal consolidation. No suspicious pulmonary nodules. No pleural effusion or pneumothorax. Musculoskeletal: Visualized osseous structures are within normal limits. Review of the MIP images confirms the above findings. CTA ABDOMEN AND PELVIS FINDINGS VASCULAR Aorta: No evidence of abdominal aortic aneurysm or dissection. Patent. Atherosclerotic calcifications. Celiac: Patent. SMA: Patent. Renals: Patent bilaterally. Atherosclerotic calcifications at the origin. IMA: Patent. Inflow: Patent bilaterally. Veins: Unremarkable. Review of the MIP images confirms the above findings. 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 Status post cholecystectomy. No intrahepatic or extrahepatic duct dilatation. Pancreas: Within normal limits. Spleen: Within normal limits. Adrenals/Urinary Tract: Adrenal glands are within normal limits. Bilateral renal cortical atrophy.  No hydronephrosis. Bladder is within normal limits. Stomach/Bowel: Stomach is within normal limits. No evidence of bowel obstruction. Appendix is not discretely visualized. Left colonic diverticulosis, without evidence of diverticulitis. Lymphatic: No suspicious abdominopelvic lymphadenopathy. Reproductive: Status post hysterectomy. Bilateral ovaries are within normal limits. Other: No abdominopelvic ascites. Musculoskeletal: Degenerative changes of the lumbar spine. Review of the MIP images confirms the above findings. IMPRESSION: No evidence of thoracoabdominal aortic aneurysm or dissection. No evidence of pulmonary embolism. No CT findings to account for the patient's abdominal pain. Additional ancillary findings as above. Electronically Signed   By: Charline Bills M.D.   On: 12/27/2022 00:49     LOS: 1 day   Jeoffrey Massed, MD  [facility]    To contact the attending provider between 7A-7P or the covering provider during after hours 7P-7A, please log into the web site [email or web address] and access using universal [place] password for that web site. If you do not have the password, please call the hospital operator.  12/28/2022, 10:58 AM

## 2022-12-29 LAB — BASIC METABOLIC PANEL
Anion gap: 10 (ref 5–15)
BUN: 7 mg/dL — ABNORMAL LOW (ref 8–23)
CO2: 16 mmol/L — ABNORMAL LOW (ref 22–32)
Calcium: 8.1 mg/dL — ABNORMAL LOW (ref 8.9–10.3)
Chloride: 107 mmol/L (ref 98–111)
Creatinine, Ser: 0.92 mg/dL (ref 0.44–1.00)
GFR, Estimated: >60 mL/min (ref >60)
Glucose, Bld: 167 mg/dL — ABNORMAL HIGH (ref 70–99)
Potassium: 3.3 mmol/L — ABNORMAL LOW (ref 3.5–5.1)
Sodium: 133 mmol/L — ABNORMAL LOW (ref 135–145)

## 2022-12-29 LAB — GLUCOSE, CAPILLARY
Glucose-Capillary: 105 mg/dL — ABNORMAL HIGH (ref 70–99)
Glucose-Capillary: 173 mg/dL — ABNORMAL HIGH (ref 70–99)
Glucose-Capillary: 126 mg/dL — ABNORMAL HIGH (ref 70–99)
Glucose-Capillary: 109 mg/dL — ABNORMAL HIGH (ref 70–99)

## 2022-12-29 LAB — COMPREHENSIVE METABOLIC PANEL
ALT: 73 U/L — ABNORMAL HIGH (ref 0–44)
AST: 120 U/L — ABNORMAL HIGH (ref 15–41)
Albumin: 3.1 g/dL — ABNORMAL LOW (ref 3.5–5.0)
Alkaline Phosphatase: 55 U/L (ref 38–126)
Anion gap: 11 (ref 5–15)
BUN: 10 mg/dL (ref 8–23)
CO2: 18 mmol/L — ABNORMAL LOW (ref 22–32)
Calcium: 8.3 mg/dL — ABNORMAL LOW (ref 8.9–10.3)
Chloride: 109 mmol/L (ref 98–111)
Creatinine, Ser: 0.89 mg/dL (ref 0.44–1.00)
GFR, Estimated: >60 mL/min (ref >60)
Glucose, Bld: 93 mg/dL (ref 70–99)
Potassium: 2.5 mmol/L — CL (ref 3.5–5.1)
Sodium: 138 mmol/L (ref 135–145)
Total Bilirubin: 0.9 mg/dL (ref 0.3–1.2)
Total Protein: 5.7 g/dL — ABNORMAL LOW (ref 6.5–8.1)

## 2022-12-29 LAB — CBC WITH DIFFERENTIAL/PLATELET
Abs Immature Granulocytes: 0.05 10*3/uL (ref 0.00–0.07)
Basophils Absolute: 0.1 10*3/uL (ref 0.0–0.1)
Basophils Relative: 1 %
Eosinophils Absolute: 0.3 10*3/uL (ref 0.0–0.5)
Eosinophils Relative: 3 %
HCT: 38.7 % (ref 36.0–46.0)
Hemoglobin: 12.7 g/dL (ref 12.0–15.0)
Immature Granulocytes: 0 %
Lymphocytes Relative: 29 %
Lymphs Abs: 3.2 10*3/uL (ref 0.7–4.0)
MCH: 30 pg (ref 26.0–34.0)
MCHC: 32.8 g/dL (ref 30.0–36.0)
MCV: 91.5 fL (ref 80.0–100.0)
Monocytes Absolute: 0.9 10*3/uL (ref 0.1–1.0)
Monocytes Relative: 8 %
Neutro Abs: 6.7 10*3/uL (ref 1.7–7.7)
Neutrophils Relative %: 59 %
Platelets: 208 10*3/uL (ref 150–400)
RBC: 4.23 MIL/uL (ref 3.87–5.11)
RDW: 14.2 % (ref 11.5–15.5)
WBC: 11.2 10*3/uL — ABNORMAL HIGH (ref 4.0–10.5)
nRBC: 0 % (ref 0.0–0.2)

## 2022-12-29 LAB — MAGNESIUM: Magnesium: 1.7 mg/dL (ref 1.7–2.4)

## 2022-12-29 LAB — URINE CULTURE: Culture: >=100000 — AB

## 2022-12-29 LAB — CK: Total CK: 981 U/L — ABNORMAL HIGH (ref 38–234)

## 2022-12-29 LAB — CULTURE, BLOOD (ROUTINE X 2)

## 2022-12-29 MED ORDER — POTASSIUM CHLORIDE CRYS ER 20 MEQ PO TBCR
20.0000 meq | EXTENDED_RELEASE_TABLET | Freq: Once | ORAL | Status: AC
  Administered 2022-12-29: 20 meq via ORAL
  Filled 2022-12-29: qty 1

## 2022-12-29 MED ORDER — POTASSIUM CHLORIDE CRYS ER 20 MEQ PO TBCR
40.0000 meq | EXTENDED_RELEASE_TABLET | Freq: Once | ORAL | Status: AC
  Administered 2022-12-29: 40 meq via ORAL
  Filled 2022-12-29: qty 2

## 2022-12-29 MED ORDER — POTASSIUM CHLORIDE 10 MEQ/100ML IV SOLN
10.0000 meq | INTRAVENOUS | Status: AC
  Administered 2022-12-29 (×2): 10 meq via INTRAVENOUS
  Filled 2022-12-29 (×2): qty 100

## 2022-12-29 MED ORDER — MAGNESIUM SULFATE IN D5W 1-5 GM/100ML-% IV SOLN
1.0000 g | Freq: Once | INTRAVENOUS | Status: AC
  Administered 2022-12-29: 1 g via INTRAVENOUS
  Filled 2022-12-29: qty 100

## 2022-12-29 MED ORDER — MAGNESIUM SULFATE 2 GM/50ML IV SOLN
2.0000 g | Freq: Once | INTRAVENOUS | Status: AC
  Administered 2022-12-29: 2 g via INTRAVENOUS
  Filled 2022-12-29: qty 50

## 2022-12-29 NOTE — Progress Notes (Signed)
Inpatient Rehab Admissions Coordinator:   Per therapy recommendations, patient was screened for CIR candidacy by Leviticus Harton, MS, CCC-SLP. At this time, Pt. does not appear to demonstrate medical necessity to justify in hospital rehabilitation/CIR. I  will not pursue a rehab consult for this Pt.   Recommend other rehab venues to be pursued.  Please contact me with any questions.  Yanni Quiroa, MS, CCC-SLP Rehab Admissions Coordinator  [phone] (celll) [phone] (office)  

## 2022-12-29 NOTE — TOC Progression Note (Addendum)
Transition of Care [facility]) - Progression Note    Patient Details  Name: Kathryn Wall MRN: [id number] Date of Birth: [birth year]  [facility] CM/SW Contact  Mearl Latin, LCSW Phone Number: 12/29/2022, 1:29 PM  Clinical Narrative:    1:29pm-[facility] able to accept patient tomorrow if stable. CSW started insurance authorization, Ref# [id number]. Daughter, Lynden Ang, updated.   2:45pm-Insurance approval received for [employer], effective 12/30/2022-01/03/2023.   Expected Discharge Plan: Skilled Nursing Facility Barriers to Discharge: Continued Medical Work up, English as a second language teacher, SNF Pending bed offer  Expected Discharge Plan and Services In-house Referral: Clinical Social Work   Post Acute Care Choice: Skilled Nursing Facility Living arrangements for the past 2 months: Single Family Home                                       Social Determinants of Health (SDOH) Interventions SDOH Screenings   Food Insecurity: No Food Insecurity (03/24/2022)  Housing: Low Risk  (03/24/2022)  Transportation Needs: No Transportation Needs (03/24/2022)  Utilities: Not At Risk (03/24/2022)  Alcohol Screen: Low Risk  (11/22/2019)  Depression (PHQ2-9): Low Risk  (08/05/2022)  Tobacco Use: Low Risk  (12/26/2022)    Readmission Risk Interventions     No data to display

## 2022-12-29 NOTE — Progress Notes (Signed)
Physical Therapy Treatment Patient Details Name: Kathryn Wall MRN: [id number] DOB: 11-06-1945 Today's Date: 12/29/2022   History of Present Illness This is a 77 year old female with PMH of HTN, HLD, anxiety, T2DM, paroxysmal atrial fibrillation on Eliquis, breast cancer with lumpectomy and radiation BRCA negative.  Patient presents with complaints of nausea, vomiting, diarrhea and severe abdominal pain.  Per patient her stomach hurt all night periumbilical and suprapubic in location and she had continuous nausea vomiting.  She denies hematemesis..  She endorses subjective low-grade fevers and chills.  Symptoms started yesterday    PT Comments  Pt tolerated today's session well but remains limited by balance, safety awareness, and cognition. Pt able to progress to hallway ambulation but requires constant cueing for forward gaze and maintaining BUE on the RW for safety and balance, requiring minA-modA to maintain balance. Pt with one major LOB standing at the edge of bed reaching behind her to adjust her gown, requiring modA to correct and education on safety. Pt able to complete sit<>stand trials from EOB but continues to need consistent cueing for proper hand placement to control descent. Acute PT will continue to follow up with pt as appropriate, discharge recommendations updated to subacute PT in a facility setting to progress safety and pt back to her PLOF prior to returning home.      Assistance Recommended at Discharge Frequent or constant Supervision/Assistance  If plan is discharge home, recommend the following:  Can travel by private vehicle    A lot of help with walking and/or transfers;A little help with bathing/dressing/bathroom;Assistance with feeding;Assistance with cooking/housework;Help with stairs or ramp for entrance;Direct supervision/assist for financial management;Direct supervision/assist for medications management;Assist for transportation   No  Equipment Recommendations   Rolling walker (2 wheels)    Recommendations for Other Services       Precautions / Restrictions Precautions Precautions: Fall Precaution Comments: impulsive Restrictions Weight Bearing Restrictions: No     Mobility  Bed Mobility Overal bed mobility: Needs Assistance Bed Mobility: Supine to Sit, Sit to Supine     Supine to sit: Min assist, HOB elevated Sit to supine: Min guard   General bed mobility comments: minA to assist trunk into sitting, close guard to return to supine for safety and balance    Transfers Overall transfer level: Needs assistance Equipment used: Rolling walker (2 wheels) Transfers: Sit to/from Stand Sit to Stand: Min assist           General transfer comment: minA to power up and steady with stands, as well as controlling descent into sitting. Pt requiring constant cueing for proper hand placement    Ambulation/Gait Ambulation/Gait assistance: Min assist, Mod assist Gait Distance (Feet): 50 Feet Assistive device: Rolling walker (2 wheels) Gait Pattern/deviations: Step-through pattern, Decreased stride length, Wide base of support, Drifts right/left Gait velocity: decreased     General Gait Details: minA-modA for balance with ambulation with increased lateral sway and moderate imbalance. Increased cueing for forward gaze and maintaining UE on RW to decrease imbalance and promote safety   Stairs             Wheelchair Mobility     Tilt Bed    Modified Rankin (Stroke Patients Only)       Balance Overall balance assessment: Needs assistance Sitting-balance support: Feet supported, Bilateral upper extremity supported Sitting balance-Leahy Scale: Fair     Standing balance support: During functional activity, Reliant on assistive device for balance, Bilateral upper extremity supported Standing balance-Leahy Scale: Poor Standing balance comment:  requiring BUE support on RW, one major LOB when reaching behind to adjust gown,  requiring modA to correct                            Cognition Arousal/Alertness: Awake/alert Behavior During Therapy: Impulsive Overall Cognitive Status: No family/caregiver present to determine baseline cognitive functioning Area of Impairment: Following commands, Safety/judgement, Awareness, Problem solving, Attention                   Current Attention Level: Focused   Following Commands: Follows one step commands inconsistently, Follows one step commands with increased time Safety/Judgement: Decreased awareness of safety, Decreased awareness of deficits Awareness: Emergent Problem Solving: Requires verbal cues, Requires tactile cues General Comments: pt impulsive throughout session, requiring frequent cueing. Poor insight into safety and deficits.        Exercises Other Exercises Other Exercises: sit<>stand x5 reps    General Comments        Pertinent Vitals/Pain Pain Assessment Pain Assessment: Faces Faces Pain Scale: No hurt    Home Living                          Prior Function            PT Goals (current goals can now be found in the care plan section) Acute Rehab PT Goals Patient Stated Goal: none stated PT Goal Formulation: Patient unable to participate in goal setting Time For Goal Achievement: 01/10/23 Progress towards PT goals: Progressing toward goals    Frequency    Min 3X/week      PT Plan Discharge plan needs to be updated    Co-evaluation              AM-PAC PT "6 Clicks" Mobility   Outcome Measure  Help needed turning from your back to your side while in a flat bed without using bedrails?: A Little Help needed moving from lying on your back to sitting on the side of a flat bed without using bedrails?: A Little Help needed moving to and from a bed to a chair (including a wheelchair)?: A Little Help needed standing up from a chair using your arms (e.g., wheelchair or bedside chair)?: A Little Help  needed to walk in hospital room?: A Lot Help needed climbing 3-5 steps with a railing? : Total 6 Click Score: 15    End of Session Equipment Utilized During Treatment: Gait belt Activity Tolerance: Patient tolerated treatment well Patient left: in bed;with call bell/phone within reach;with bed alarm set Nurse Communication: Mobility status PT Visit Diagnosis: Other abnormalities of gait and mobility (R26.89);Pain Pain - part of body:  (stomach)     Time: 1610-9604 PT Time Calculation (min) (ACUTE ONLY): 14 min  Charges:    $Therapeutic Activity: 8-22 mins PT General Charges $$ ACUTE PT VISIT: 1 Visit                     Lindalou Hose, PT DPT Acute Rehabilitation Services Office [phone]    Leonie Man 12/29/2022, 1:10 PM

## 2022-12-29 NOTE — Progress Notes (Addendum)
PROGRESS NOTE        PATIENT DETAILS Name: Kathryn Wall Age: 77 y.o. Sex: female Date of Birth: 29-Nov-1945 Admit Date: 12/26/2022 Admitting Physician Dewayne Shorter Levora Dredge, MD [id number], Priscille Heidelberg, MD  Brief Summary: Patient is a 77 y.o.  female with history of HTN, HLD, DM-2, PAF on Eliquis, breast cancer-s/p lumpectomy/radiation-presented with abdominal pain, along with nausea, vomiting and diarrhea.  Significant events: 6/30>> admit to [facility]  Significant studies: 7/1>> CT angio chest abdomen/pelvis: No evidence of aneurysm/dissection/PE.  No acute findings. 7/1>> RUQ ultrasound: No significant abnormality 7/1>> acute hepatitis serology: Negative  Significant microbiology data: 7/1>> blood culture: No growth 7/1>> urine culture: E. coli 7/2>> blood culture: No growth  Procedures: None  Consults: GI  Subjective: No major issues overnight-lying comfortably in bed-Per nursing staff-uneventful night.  Denies any nausea, vomiting, diarrhea abdominal pain or headache.  Objective: Vitals: Blood pressure (!) 159/90, pulse (!) 52, temperature 98.1 F (36.7 C), temperature source Oral, resp. rate 20, height 5\' 5"  (1.651 m), weight 75 kg, SpO2 97 %.   Exam: Gen Exam:Alert awake-not in any distress HEENT:atraumatic, normocephalic Chest: B/L clear to auscultation anteriorly CVS:S1S2 regular Abdomen:soft non tender, non distended Extremities:no edema Neurology: Non focal Skin: no rash  Pertinent Labs/Radiology:    Latest Ref Rng & Units 12/29/2022    3:40 AM 12/28/2022    3:34 AM 12/27/2022    4:12 AM  CBC  WBC 4.0 - 10.5 K/uL 11.2  18.4  17.1   Hemoglobin 12.0 - 15.0 g/dL 09.8  11.9  14.7   Hematocrit 36.0 - 46.0 % 38.7  40.2  39.3   Platelets 150 - 400 K/uL 208  223  253     Lab Results  Component Value Date   NA 138 12/29/2022   K 2.5 (LL) 12/29/2022   CL 109 12/29/2022   CO2 18 (L) 12/29/2022    Assessment/Plan: Abdominal pain with  nausea/vomiting/diarrhea ?  Viral gastroenteritis Abdominal pain/nausea/vomiting and diarrhea have all resolved. CT imaging nonacute  Transaminitis Probably reflective of viral gastroenteritis-mild rhabdomyolysis CT abdomen/RUQ ultrasound nonacute (s/p cholecystectomy) LFTs improving-continue to trend LFTs.  Leukocytosis Likely secondary to gastroenteritis WBC has essentially normalized without the use of any antibiotics Cultures negative so far.    Rhabdomyolysis Mild Nontraumatic CK levels have improved significantly.  Delirium Chronic cognitive dysfunction-probably dementia Reviewed prior notes-has seen outpatient neurology-suspicion for some chronic cognitive issues at baseline Suspect had some delirium related to acute illness over the past several days-but much better-hardly any confusion on my evaluation this morning-answering most of my questions appropriately. Delirium precautions Suspect as LFTs/leukocytosis continues to improve-she will likely improve further.  CKD stage IIIa At baseline  Hypokalemia 4 rounds of IV KCl and 40 mEq x 1 oral Recheck electrolytes tomorrow  Asymptomatic bacteriuria Does not have any symptoms consistent with UTI See above.  HLD Statin on hold until rhabdomyolysis improves further  HTN BP stable Norvasc/metoprolol/losartan  PAF Telemetry monitoring Metoprolol Continue Eliquis.  Type II DM CBG stable with SSI Resume oral hypoglycemic agents on discharge  Recent Labs    12/28/22 2010 12/28/22 2059 12/29/22 0829  GLUCAP 96 114* 105*     Chronic anxiety/depression Continue Paxil/Seroquel/venlafaxine  GERD PPI  BMI: Estimated body mass index is 27.51 kg/m as calculated from the following:   Height as of this encounter: 5\' 5"  (1.651 m).  Weight as of this encounter: 75 kg.   Code status:   Code Status: Full Code   DVT Prophylaxis: apixaban (ELIQUIS) tablet 5 mg    Family Communication: Spoke with daughter  Olegario Messier [phone] left voicemail on 7/3   Disposition Plan: Status is: Observation The patient will require care spanning > 2 midnights and should be moved to inpatient because: Severity of illness   Planned Discharge Destination:Home   Diet: Diet Order             Diet heart healthy/carb modified Room service appropriate? Yes with Assist; Fluid consistency: Thin  Diet effective now                     Antimicrobial agents: Anti-infectives (From admission, onward)    Start     Dose/Rate Route Frequency Ordered Stop   12/28/22 0045  vancomycin (VANCOCIN) IVPB 1000 mg/200 mL premix  Status:  Discontinued       See Hyperspace for full Linked Orders Report.   1,000 mg 200 mL/hr over 60 Minutes Intravenous Every 24 hours 12/27/22 0036 12/27/22 0641   12/27/22 0745  piperacillin-tazobactam (ZOSYN) IVPB 3.375 g  Status:  Discontinued        3.375 g 12.5 mL/hr over 240 Minutes Intravenous Every 8 hours 12/27/22 0648 12/27/22 1149   12/27/22 0045  vancomycin (VANCOREADY) IVPB 1500 mg/300 mL       See Hyperspace for full Linked Orders Report.   1,500 mg 150 mL/hr over 120 Minutes Intravenous  Once 12/27/22 0036 12/27/22 0435   12/27/22 0045  ceFEPIme (MAXIPIME) 2 g in sodium chloride 0.9 % 100 mL IVPB  Status:  Discontinued        2 g 200 mL/hr over 30 Minutes Intravenous Every 12 hours 12/27/22 0036 12/27/22 0641   12/27/22 0030  metroNIDAZOLE (FLAGYL) IVPB 500 mg  Status:  Discontinued        500 mg 100 mL/hr over 60 Minutes Intravenous Every 12 hours 12/27/22 0029 12/27/22 0641        MEDICATIONS: Scheduled Meds:  amLODipine  5 mg Oral Daily   apixaban  5 mg Oral BID   cyanocobalamin  1,000 mcg Oral Daily   insulin aspart  0-9 Units Subcutaneous TID WC   losartan  50 mg Oral Daily   metoprolol succinate  25 mg Oral Daily   mirabegron ER  25 mg Oral Daily   pantoprazole  40 mg Oral q morning   PARoxetine  20 mg Oral Daily   QUEtiapine  25 mg Oral QHS    venlafaxine XR  75 mg Oral Q breakfast   Continuous Infusions:  sodium chloride 75 mL/hr at 12/29/22 0844   PRN Meds:.hydrALAZINE, HYDROmorphone (DILAUDID) injection, LORazepam, ondansetron **OR** ondansetron (ZOFRAN) IV   I have personally reviewed following labs and imaging studies  LABORATORY DATA: CBC: Recent Labs  Lab 12/26/22 2308 12/27/22 0412 12/28/22 0334 12/29/22 0340  WBC 22.0* 17.1* 18.4* 11.2*  NEUTROABS 19.1* 14.4*  --  6.7  HGB 14.4 12.9 12.9 12.7  HCT 42.7 39.3 40.2 38.7  MCV 89.5 91.2 95.7 91.5  PLT 312 253 223 208     Basic Metabolic Panel: Recent Labs  Lab 12/26/22 2308 12/27/22 0412 12/28/22 0334 12/29/22 0340  NA 139 140 138 138  K 4.2 3.9 3.5 2.5*  CL 107 107 108 109  CO2 13* 15* 17* 18*  GLUCOSE 187* 168* 89 93  BUN 17 15 15 10   CREATININE 1.25* 1.05*  0.95 0.89  CALCIUM 9.0 8.6* 8.5* 8.3*     GFR: Estimated Creatinine Clearance: 52.8 mL/min (by C-G formula based on SCr of 0.89 mg/dL).  Liver Function Tests: Recent Labs  Lab 12/26/22 2308 12/27/22 0412 12/28/22 0334 12/29/22 0340  AST 88* 116* 160* 120*  ALT 86* 85* 82* 73*  ALKPHOS 84 77 65 55  BILITOT 2.2* 2.0* 0.9 0.9  PROT 7.4 6.6 6.1* 5.7*  ALBUMIN 4.2 3.8 3.4* 3.1*    Recent Labs  Lab 12/26/22 2308  LIPASE 61*    No results for input(s): "AMMONIA" in the last 168 hours.  Coagulation Profile: Recent Labs  Lab 12/27/22 0045  INR 1.4*     Cardiac Enzymes: Recent Labs  Lab 12/27/22 0412 12/28/22 0334 12/29/22 0340  CKTOTAL 3,672* 3,143* 981*     BNP (last 3 results) No results for input(s): "PROBNP" in the last 8760 hours.  Lipid Profile: No results for input(s): "CHOL", "HDL", "LDLCALC", "TRIG", "CHOLHDL", "LDLDIRECT" in the last 72 hours.  Thyroid Function Tests: No results for input(s): "TSH", "T4TOTAL", "FREET4", "T3FREE", "THYROIDAB" in the last 72 hours.  Anemia Panel: No results for input(s): "VITAMINB12", "FOLATE", "FERRITIN", "TIBC",  "IRON", "RETICCTPCT" in the last 72 hours.  Urine analysis:    Component Value Date/Time   COLORURINE YELLOW 12/27/2022 0051   APPEARANCEUR HAZY (A) 12/27/2022 0051   LABSPEC 1.023 12/27/2022 0051   PHURINE 6.0 12/27/2022 0051   GLUCOSEU >=500 (A) 12/27/2022 0051   HGBUR LARGE (A) 12/27/2022 0051   BILIRUBINUR NEGATIVE 12/27/2022 0051   KETONESUR 20 (A) 12/27/2022 0051   PROTEINUR 30 (A) 12/27/2022 0051   UROBILINOGEN 0.2 01/13/2015 1201   NITRITE NEGATIVE 12/27/2022 0051   LEUKOCYTESUR MODERATE (A) 12/27/2022 0051    Sepsis Labs: Lactic Acid, Venous    Component Value Date/Time   LATICACIDVEN 1.4 12/27/2022 0805    MICROBIOLOGY: Recent Results (from the past 240 hour(s))  Blood Culture (routine x 2)     Status: None (Preliminary result)   Collection Time: 12/27/22 12:28 AM   Specimen: BLOOD  Result Value Ref Range Status   Specimen Description BLOOD RIGHT ANTECUBITAL  Final   Special Requests   Final    BOTTLES DRAWN AEROBIC ONLY Blood Culture results may not be optimal due to an inadequate volume of blood received in culture bottles   Culture   Final    NO GROWTH 2 DAYS Performed at [facility], [address], [place], [place] [address]    Report Status PENDING  Incomplete  Urine Culture     Status: Abnormal   Collection Time: 12/27/22 12:51 AM   Specimen: Urine, Clean Catch  Result Value Ref Range Status   Specimen Description URINE, CLEAN CATCH  Final   Special Requests   Final    NONE Performed at [facility], [address] [place], [place] [address]    Culture >=100,000 COLONIES/mL ESCHERICHIA COLI (A)  Final   Report Status 12/29/2022 FINAL  Final   Organism ID, Bacteria ESCHERICHIA COLI (A)  Final      Susceptibility   Escherichia coli - MIC*    AMPICILLIN 4 SENSITIVE Sensitive     CEFAZOLIN <=4 SENSITIVE Sensitive     CEFEPIME <=0.12 SENSITIVE Sensitive     CEFTRIAXONE <=0.25 SENSITIVE Sensitive     CIPROFLOXACIN <=0.25 SENSITIVE  Sensitive     GENTAMICIN <=1 SENSITIVE Sensitive     IMIPENEM <=0.25 SENSITIVE Sensitive     NITROFURANTOIN <=16 SENSITIVE Sensitive  TRIMETH/SULFA <=20 SENSITIVE Sensitive     AMPICILLIN/SULBACTAM <=2 SENSITIVE Sensitive     PIP/TAZO <=4 SENSITIVE Sensitive     * >=100,000 COLONIES/mL ESCHERICHIA COLI  Culture, blood (Routine X 2) w Reflex to ID Panel     Status: None (Preliminary result)   Collection Time: 12/28/22  1:18 PM   Specimen: BLOOD  Result Value Ref Range Status   Specimen Description BLOOD SITE NOT SPECIFIED  Final   Special Requests   Final    BOTTLES DRAWN AEROBIC ONLY Blood Culture results may not be optimal due to an inadequate volume of blood received in culture bottles   Culture   Final    NO GROWTH < 24 HOURS Performed at [facility], [address] [place], [place] [address]    Report Status PENDING  Incomplete  Culture, blood (Routine X 2) w Reflex to ID Panel     Status: None (Preliminary result)   Collection Time: 12/28/22  1:22 PM   Specimen: BLOOD  Result Value Ref Range Status   Specimen Description BLOOD SITE NOT SPECIFIED  Final   Special Requests   Final    BOTTLES DRAWN AEROBIC ONLY Blood Culture results may not be optimal due to an inadequate volume of blood received in culture bottles   Culture   Final    NO GROWTH < 24 HOURS Performed at [facility], [address], [place], [place] [address]    Report Status PENDING  Incomplete    RADIOLOGY STUDIES/RESULTS: No results found.   LOS: 2 days   Jeoffrey Massed, MD  [facility]    To contact the attending provider between 7A-7P or the covering provider during after hours 7P-7A, please log into the web site [email or web address] and access using universal [place] password for that web site. If you do not have the password, please call the hospital operator.  12/29/2022, 11:28 AM

## 2022-12-30 DIAGNOSIS — Z853 Personal history of malignant neoplasm of breast: Secondary | ICD-10-CM

## 2022-12-30 DIAGNOSIS — E1169 Type 2 diabetes mellitus with other specified complication: Secondary | ICD-10-CM

## 2022-12-30 LAB — COMPREHENSIVE METABOLIC PANEL
ALT: 68 U/L — ABNORMAL HIGH (ref 0–44)
AST: 73 U/L — ABNORMAL HIGH (ref 15–41)
Albumin: 3.2 g/dL — ABNORMAL LOW (ref 3.5–5.0)
Alkaline Phosphatase: 57 U/L (ref 38–126)
Anion gap: 9 (ref 5–15)
BUN: 6 mg/dL — ABNORMAL LOW (ref 8–23)
CO2: 19 mmol/L — ABNORMAL LOW (ref 22–32)
Calcium: 8.5 mg/dL — ABNORMAL LOW (ref 8.9–10.3)
Chloride: 110 mmol/L (ref 98–111)
Creatinine, Ser: 0.75 mg/dL (ref 0.44–1.00)
GFR, Estimated: >60 mL/min (ref >60)
Glucose, Bld: 99 mg/dL (ref 70–99)
Potassium: 3 mmol/L — ABNORMAL LOW (ref 3.5–5.1)
Sodium: 138 mmol/L (ref 135–145)
Total Bilirubin: 0.6 mg/dL (ref 0.3–1.2)
Total Protein: 5.8 g/dL — ABNORMAL LOW (ref 6.5–8.1)

## 2022-12-30 LAB — GLUCOSE, CAPILLARY: Glucose-Capillary: 109 mg/dL — ABNORMAL HIGH (ref 70–99)

## 2022-12-30 LAB — MAGNESIUM: Magnesium: 2 mg/dL (ref 1.7–2.4)

## 2022-12-30 MED ORDER — POTASSIUM CHLORIDE CRYS ER 20 MEQ PO TBCR
40.0000 meq | EXTENDED_RELEASE_TABLET | Freq: Once | ORAL | Status: AC
  Administered 2022-12-30: 40 meq via ORAL
  Filled 2022-12-30: qty 2

## 2022-12-30 NOTE — TOC Transition Note (Signed)
Transition of Care [facility]) - CM/SW Discharge Note   Patient Details  Name: Kathryn Wall MRN: [id number] Date of Birth: 12/08/1945  Transition of Care [facility]) CM/SW Contact:  Ralene Bathe, LCSW Phone Number: 12/30/2022, 10:41 AM   Clinical Narrative:    Patient will DC to:  [facility]  Anticipated DC date: 12/30/2022 Family notified: Yes Transport by:  Sharin Mons   Per MD patient ready for DC to SNF. RN to call report prior to discharge [phone] room 114). RN, patient's family, and facility notified of DC. Discharge Summary sent to facility. DC packet on chart. Ambulance transport will be requested for patient.   CSW will sign off for now as social work intervention is no longer needed. Please consult [place] again if new needs arise.    Final next level of care: Skilled Nursing Facility Barriers to Discharge: Barriers Resolved   Patient Goals and CMS Choice CMS Medicare.gov Compare Post Acute Care list provided to:: Patient Represenative (must comment) Choice offered to / list presented to : Adult Children  Discharge Placement                Patient chooses bed at: [facility] Patient to be transferred to facility by: [facility] Name of family member notified: [name] (Daughter) [phone] Patient and family notified of of transfer: 12/30/22  Discharge Plan and Services Additional resources added to the After Visit Summary for   In-house Referral: Clinical Social Work   Post Acute Care Choice: Skilled Nursing Facility                               Social Determinants of Health (SDOH) Interventions SDOH Screenings   Food Insecurity: No Food Insecurity (12/29/2022)  Housing: Low Risk  (12/29/2022)  Transportation Needs: No Transportation Needs (12/29/2022)  Utilities: Not At Risk (12/29/2022)  Alcohol Screen: Low Risk  (11/22/2019)  Depression (PHQ2-9): Low Risk  (08/05/2022)  Tobacco Use: Low Risk  (12/26/2022)     Readmission Risk  Interventions     No data to display

## 2022-12-30 NOTE — Care Management Important Message (Signed)
Important Message  Patient Details  Name: Kathryn Wall MRN: [id number] Date of Birth: 1945/11/19   Medicare Important Message Given:  Yes     Mickle Campton Stefan Church 12/30/2022, 12:09 PM

## 2022-12-30 NOTE — Discharge Summary (Signed)
PATIENT DETAILS Name: Kathryn Wall Age: 77 y.o. Sex: female Date of Birth: [birth year] MRN: [id number]. Admitting Physician: Maretta Bees, MD [id number], Priscille Heidelberg, MD  Admit Date: 12/26/2022 Discharge date: 12/30/2022  Recommendations for Outpatient Follow-up:  Follow up with PCP in 1-2 weeks Please obtain CBC/LFT/BMET/CK in 1 week-resume statins when able.  Admitted From:  Home  Disposition: Skilled nursing facility   Discharge Condition: good  CODE STATUS:   Code Status: Full Code   Diet recommendation:  Diet Order             Diet - low sodium heart healthy           Diet Carb Modified           Diet heart healthy/carb modified Room service appropriate? Yes with Assist; Fluid consistency: Thin  Diet effective now                    Brief Summary: Patient is a 77 y.o.  female with history of HTN, HLD, DM-2, PAF on Eliquis, breast cancer-s/p lumpectomy/radiation-presented with abdominal pain, along with nausea, vomiting and diarrhea.   Significant events: 6/30>> admit to [facility]   Significant studies: 7/1>> CT angio chest abdomen/pelvis: No evidence of aneurysm/dissection/PE.  No acute findings. 7/1>> RUQ ultrasound: No significant abnormality 7/1>> acute hepatitis serology: Negative   Significant microbiology data: 7/1>> blood culture: No growth 7/1>> urine culture: E. coli 7/2>> blood culture: No growth   Procedures: None   Consults: GI  Brief Hospital Course: Abdominal pain with nausea/vomiting/diarrhea ?  Viral gastroenteritis Abdominal pain/nausea/vomiting and diarrhea have all resolved. CT imaging nonacute   Transaminitis Probably reflective of viral gastroenteritis-mild rhabdomyolysis CT abdomen/RUQ ultrasound nonacute (s/p cholecystectomy) LFTs improving-continue to trend LFTs as an outpatient-suggest repeat in 1 week   Leukocytosis Likely secondary to gastroenteritis WBC has essentially normalized without the use of any  antibiotics Cultures negative so far.     Rhabdomyolysis Mild Nontraumatic CK levels have improved significantly.   Delirium Chronic cognitive dysfunction-probably dementia Reviewed prior notes-has seen outpatient neurology-suspicion for some chronic cognitive issues at baseline Suspect had some delirium related to acute illness over the past several days-but much better-hardly any confusion on my evaluation this morning-answering most of my questions appropriately. Delirium precautions Suspect as LFTs/leukocytosis continues to improve-she will likely improve further. Spoke with daughter-catheter this morning-per her-patient's mentation much improved and close to baseline.   CKD stage IIIa At baseline   Hypokalemia Improving-replete prior to discharge today.  Asymptomatic bacteriuria Does not have any symptoms consistent with UTI.  No indication to treat at this point. See above.   HLD Statin held due to transaminitis/rhabdomyolysis-okay to resume once LFTs improve further-recheck LFTs in 1 week.   HTN BP stable Norvasc/metoprolol/losartan   PAF Telemetry monitoring Metoprolol Continue Eliquis.   Type II DM CBG stable with SSI while inpatient Resume oral hypoglycemic agents on discharge  Chronic anxiety/depression Continue Paxil/Seroquel/venlafaxine   GERD PPI  Debility/deconditioning PT/OT eval-SNF recommended.  Since medically stable-improved-stable to transfer today.  Family-daughter Olegario Messier aware.   BMI: Estimated body mass index is 27.51 kg/m as calculated from the following:   Height as of this encounter: 5\' 5"  (1.651 m).   Weight as of this encounter: 75 kg.   Discharge Diagnoses:  Principal Problem:   Gastroenteritis Active Problems:   Essential hypertension, benign   Hypercholesteremia   Chronic kidney disease, stage 3a (HCC)   T2DM (type 2 diabetes mellitus) (HCC)   History of breast  cancer   Discharge Instructions:  Activity:  As  tolerated with Full fall precautions use walker/cane & assistance as needed  Discharge Instructions     Call MD for:  difficulty breathing, headache or visual disturbances   Complete by: As directed    Call MD for:  extreme fatigue   Complete by: As directed    Call MD for:  persistant dizziness or light-headedness   Complete by: As directed    Diet - low sodium heart healthy   Complete by: As directed    Diet Carb Modified   Complete by: As directed    Discharge instructions   Complete by: As directed    Follow with Primary MD  Donita Brooks, MD in 1-2 weeks  Please get a complete blood count and chemistry panel checked by your Primary MD at your next visit, and again as instructed by your Primary MD.  Get Medicines reviewed and adjusted: Please take all your medications with you for your next visit with your Primary MD  Laboratory/radiological data: Please request your Primary MD to go over all hospital tests and procedure/radiological results at the follow up, please ask your Primary MD to get all Hospital records sent to his/her office.  In some cases, they will be blood work, cultures and biopsy results pending at the time of your discharge. Please request that your primary care M.D. follows up on these results.  Also Note the following: If you experience worsening of your admission symptoms, develop shortness of breath, life threatening emergency, suicidal or homicidal thoughts you must seek medical attention immediately by calling 911 or calling your MD immediately  if symptoms less severe.  You must read complete instructions/literature along with all the possible adverse reactions/side effects for all the Medicines you take and that have been prescribed to you. Take any new Medicines after you have completely understood and accpet all the possible adverse reactions/side effects.   Do not drive when taking Pain medications or sleeping medications (Benzodaizepines)  Do  not take more than prescribed Pain, Sleep and Anxiety Medications. It is not advisable to combine anxiety,sleep and pain medications without talking with your primary care practitioner  Special Instructions: If you have smoked or chewed Tobacco  in the last 2 yrs please stop smoking, stop any regular Alcohol  and or any Recreational drug use.  Wear Seat belts while driving.  Please note: You were cared for by a hospitalist during your hospital stay. Once you are discharged, your primary care physician will handle any further medical issues. Please note that NO REFILLS for any discharge medications will be authorized once you are discharged, as it is imperative that you return to your primary care physician (or establish a relationship with a primary care physician if you do not have one) for your post hospital discharge needs so that they can reassess your need for medications and monitor your lab values.   Check CBGs ACHS.   Increase activity slowly   Complete by: As directed       Allergies as of 12/30/2022       Reactions   Ambien [zolpidem Tartrate] Other (See Comments)   Sleep walking   Cipro [ciprofloxacin Hcl] Nausea Only   Cymbalta [duloxetine Hcl] Other (See Comments)   Unknown Reaction   Other    SENSITIVE TO ANTIBIOTICS   Cardura [doxazosin] Nausea And Vomiting   Percocet [oxycodone-acetaminophen] Nausea And Vomiting        Medication List  TAKE these medications    acetaminophen 500 MG tablet Commonly known as: TYLENOL Take 1,000 mg by mouth daily as needed for moderate pain, fever or headache.   amLODipine 5 MG tablet Commonly known as: NORVASC TAKE 1 TABLET BY MOUTH ONCE A DAY   atorvastatin 40 MG tablet Commonly known as: LIPITOR TAKE ONE TABLET BY MOUTH ONCE A DAY   calcium carbonate 1250 (500 Ca) MG tablet Commonly known as: OS-CAL - dosed in mg of elemental calcium Take 1 tablet (1,250 mg total) by mouth 2 (two) times daily with a meal.    cyanocobalamin 1000 MCG tablet Commonly known as: VITAMIN B12 Take 1 tablet (1,000 mcg total) by mouth daily.   Eliquis 5 MG Tabs tablet Generic drug: apixaban TAKE 1 TABLET BY MOUTH TWICE A DAY What changed: how much to take   Jardiance 25 MG Tabs tablet Generic drug: empagliflozin TAKE 1 TABLET BY MOUTH ONCE A DAY What changed: how much to take   losartan 50 MG tablet Commonly known as: COZAAR TAKE ONE TABLET BY MOUTH ONCE A DAY   metFORMIN 500 MG 24 hr tablet Commonly known as: GLUCOPHAGE-XR TAKE 4 TABLETS BY MOUTH ONCE DAILY WITH BREAKFAST. What changed: See the new instructions.   metoprolol succinate 25 MG 24 hr tablet Commonly known as: TOPROL-XL TAKE ONE TABLET BY MOUTH DAILY   Myrbetriq 25 MG Tb24 tablet Generic drug: mirabegron ER TAKE ONE TABLET BY MOUTH DAILY   ondansetron 4 MG disintegrating tablet Commonly known as: ZOFRAN-ODT Take 4 mg by mouth every 8 (eight) hours as needed for nausea or vomiting.   One A Day Women 50 Plus Chew Chew 1 each by mouth daily.   pantoprazole 40 MG tablet Commonly known as: PROTONIX TAKE ONE TABLET BY MOUTH EVERY MORNING What changed: when to take this   PARoxetine 20 MG tablet Commonly known as: PAXIL TAKE ONE TABLET BY MOUTH ONCE A DAY   QUEtiapine 50 MG tablet Commonly known as: SEROQUEL TAKE ONE TABLET BY MOUTH EVERY NIGHT AT BEDTIME   venlafaxine XR 75 MG 24 hr capsule Commonly known as: EFFEXOR-XR TAKE ONE CAPSULE BY MOUTH ONCE DAILY WITH BREAKFAST What changed: See the new instructions.        Follow-up Information     Donita Brooks, MD. Schedule an appointment as soon as possible for a visit in 1 week(s).   Specialty: Family Medicine Contact information: [address] [place] [address] [phone]                Allergies  Allergen Reactions   Ambien [Zolpidem Tartrate] Other (See Comments)    Sleep walking   Cipro [Ciprofloxacin Hcl] Nausea Only   Cymbalta  [Duloxetine Hcl] Other (See Comments)    Unknown Reaction   Other     SENSITIVE TO ANTIBIOTICS   Cardura [Doxazosin] Nausea And Vomiting   Percocet [Oxycodone-Acetaminophen] Nausea And Vomiting     Other Procedures/Studies: US Abdomen Limited RUQ (LIVER/GB)  Result Date: 12/27/2022 CLINICAL DATA:  Elevated liver function tests EXAM: ULTRASOUND ABDOMEN LIMITED RIGHT UPPER QUADRANT COMPARISON:  12/27/2022 CT chest abdomen pelvis FINDINGS: Gallbladder: Surgically absent Common bile duct: Diameter: 3 mm Liver: Parenchymal echogenicity: Within normal limits Contours: Normal Lesions: None. The lesion seen in the right hepatic lobe on the CT is not well visualized on ultrasound. Portal vein: Patent.  Hepatopetal flow Other: None. IMPRESSION: No significant sonographic abnormality of the liver. Electronically Signed   By: Harrington Challenger.D.  On: 12/27/2022 08:07   CT Angio Chest/Abd/Pel for Dissection W and/or W/WO  Result Date: 12/27/2022 CLINICAL DATA:  Abdominal pain EXAM: CT ANGIOGRAPHY CHEST, ABDOMEN AND PELVIS TECHNIQUE: Non-contrast CT of the chest was initially obtained. Multidetector CT imaging through the chest, abdomen and pelvis was performed using the standard protocol during bolus administration of intravenous contrast. Multiplanar reconstructed images and MIPs were obtained and reviewed to evaluate the vascular anatomy. RADIATION DOSE REDUCTION: This exam was performed according to the departmental dose-optimization program which includes automated exposure control, adjustment of the mA and/or kV according to patient size and/or use of iterative reconstruction technique. CONTRAST:  75mL OMNIPAQUE IOHEXOL 350 MG/ML SOLN COMPARISON:  CT abdomen/pelvis dated 01/27/2022 FINDINGS: CTA CHEST FINDINGS Cardiovascular: On unenhanced CT, there is no evidence of intramural hematoma. Following contrast administration, there is no evidence of thoracic aortic aneurysm or dissection. Mild atherosclerotic  calcifications of the aortic arch. Although not tailored for evaluation of the pulmonary arteries, there is no evidence of pulmonary embolism to the lobar level. The heart is normal in size.  No pericardial effusion. Mediastinum/Nodes: No suspicious mediastinal lymphadenopathy. Visualized thyroid is unremarkable. Lungs/Pleura: Lungs are essentially clear. Very mild scarring/atelectasis in the inferomedial right middle lobe and inferior lingula. No focal consolidation. No suspicious pulmonary nodules. No pleural effusion or pneumothorax. Musculoskeletal: Visualized osseous structures are within normal limits. Review of the MIP images confirms the above findings. CTA ABDOMEN AND PELVIS FINDINGS VASCULAR Aorta: No evidence of abdominal aortic aneurysm or dissection. Patent. Atherosclerotic calcifications. Celiac: Patent. SMA: Patent. Renals: Patent bilaterally. Atherosclerotic calcifications at the origin. IMA: Patent. Inflow: Patent bilaterally. Veins: Unremarkable. Review of the MIP images confirms the above findings. 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 Status post cholecystectomy. No intrahepatic or extrahepatic duct dilatation. Pancreas: Within normal limits. Spleen: Within normal limits. Adrenals/Urinary Tract: Adrenal glands are within normal limits. Bilateral renal cortical atrophy.  No hydronephrosis. Bladder is within normal limits. Stomach/Bowel: Stomach is within normal limits. No evidence of bowel obstruction. Appendix is not discretely visualized. Left colonic diverticulosis, without evidence of diverticulitis. Lymphatic: No suspicious abdominopelvic lymphadenopathy. Reproductive: Status post hysterectomy. Bilateral ovaries are within normal limits. Other: No abdominopelvic ascites. Musculoskeletal: Degenerative changes of the lumbar spine.  Review of the MIP images confirms the above findings. IMPRESSION: No evidence of thoracoabdominal aortic aneurysm or dissection. No evidence of pulmonary embolism. No CT findings to account for the patient's abdominal pain. Additional ancillary findings as above. Electronically Signed   By: Charline Bills M.D.   On: 12/27/2022 00:49     TODAY-DAY OF DISCHARGE:  Subjective:   Kathryn Wall today has no headache,no chest abdominal pain,no new weakness tingling or numbness, feels much better wants to go home today.   Objective:   Blood pressure (!) 148/94, pulse (!) 44, temperature 98 F (36.7 C), temperature source Oral, resp. rate 18, height 5\' 5"  (1.651 m), weight 75 kg, SpO2 95 %.  Intake/Output Summary (Last 24 hours) at 12/30/2022 0942 Last data filed at 12/30/2022 0859 Gross per 24 hour  Intake 2782.46 ml  Output 3700 ml  Net -917.54 ml   Filed Weights   12/26/22 2207  Weight: 75 kg    Exam: Awake Alert, Oriented *3, No new F.N deficits, Normal affect [place].AT,PERRAL Supple Neck,No JVD, No cervical lymphadenopathy appriciated.  Symmetrical Chest wall movement, Good air movement bilaterally, CTAB RRR,No Gallops,Rubs or new  Murmurs, No Parasternal Heave +ve B.Sounds, Abd Soft, Non tender, No organomegaly appriciated, No rebound -guarding or rigidity. No Cyanosis, Clubbing or edema, No new Rash or bruise   PERTINENT RADIOLOGIC STUDIES: No results found.   PERTINENT LAB RESULTS: CBC: Recent Labs    12/28/22 0334 12/29/22 0340  WBC 18.4* 11.2*  HGB 12.9 12.7  HCT 40.2 38.7  PLT 223 208   CMET CMP     Component Value Date/Time   NA 138 12/30/2022 0139   NA 143 12/16/2020 1524   K 3.0 (L) 12/30/2022 0139   CL 110 12/30/2022 0139   CO2 19 (L) 12/30/2022 0139   GLUCOSE 99 12/30/2022 0139   BUN 6 (L) 12/30/2022 0139   BUN 17 12/16/2020 1524   CREATININE 0.75 12/30/2022 0139   CREATININE 1.11 (H) 08/05/2022 1521   CALCIUM 8.5 (L) 12/30/2022 0139   PROT 5.8 (L)  12/30/2022 0139   PROT 7.4 12/16/2020 1524   ALBUMIN 3.2 (L) 12/30/2022 0139   ALBUMIN 4.5 12/16/2020 1524   AST 73 (H) 12/30/2022 0139   ALT 68 (H) 12/30/2022 0139   ALKPHOS 57 12/30/2022 0139   BILITOT 0.6 12/30/2022 0139   BILITOT 0.3 12/16/2020 1524   EGFR 51 (L) 08/05/2022 1521   EGFR 50 (L) 12/16/2020 1524   GFRNONAA >60 12/30/2022 0139   GFRNONAA 48 (L) 08/11/2020 1541    GFR Estimated Creatinine Clearance: 58.7 mL/min (by C-G formula based on SCr of 0.75 mg/dL). No results for input(s): "LIPASE", "AMYLASE" in the last 72 hours. Recent Labs    12/28/22 0334 12/29/22 0340  CKTOTAL 3,143* 981*   Invalid input(s): "POCBNP" No results for input(s): "DDIMER" in the last 72 hours. No results for input(s): "HGBA1C" in the last 72 hours. No results for input(s): "CHOL", "HDL", "LDLCALC", "TRIG", "CHOLHDL", "LDLDIRECT" in the last 72 hours. No results for input(s): "TSH", "T4TOTAL", "T3FREE", "THYROIDAB" in the last 72 hours.  Invalid input(s): "FREET3" No results for input(s): "VITAMINB12", "FOLATE", "FERRITIN", "TIBC", "IRON", "RETICCTPCT" in the last 72 hours. Coags: No results for input(s): "INR" in the last 72 hours.  Invalid input(s): "PT" Microbiology: Recent Results (from the past 240 hour(s))  Blood Culture (routine x 2)     Status: None (Preliminary result)   Collection Time: 12/27/22 12:28 AM   Specimen: BLOOD  Result Value Ref Range Status   Specimen Description BLOOD RIGHT ANTECUBITAL  Final   Special Requests   Final    BOTTLES DRAWN AEROBIC ONLY Blood Culture results may not be optimal due to an inadequate volume of blood received in culture bottles   Culture   Final    NO GROWTH 2 DAYS Performed at [facility], [address], [place], [place] [address]    Report Status PENDING  Incomplete  Urine Culture     Status: Abnormal   Collection Time: 12/27/22 12:51 AM   Specimen: Urine, Clean Catch  Result Value Ref Range Status   Specimen  Description URINE, CLEAN CATCH  Final   Special Requests   Final    NONE Performed at [facility], [address], [place], [place] [address]    Culture >=100,000 COLONIES/mL ESCHERICHIA COLI (A)  Final   Report Status 12/29/2022 FINAL  Final   Organism ID, Bacteria ESCHERICHIA COLI (A)  Final      Susceptibility   Escherichia coli - MIC*    AMPICILLIN 4 SENSITIVE Sensitive     CEFAZOLIN <=4 SENSITIVE Sensitive  CEFEPIME <=0.12 SENSITIVE Sensitive     CEFTRIAXONE <=0.25 SENSITIVE Sensitive     CIPROFLOXACIN <=0.25 SENSITIVE Sensitive     GENTAMICIN <=1 SENSITIVE Sensitive     IMIPENEM <=0.25 SENSITIVE Sensitive     NITROFURANTOIN <=16 SENSITIVE Sensitive     TRIMETH/SULFA <=20 SENSITIVE Sensitive     AMPICILLIN/SULBACTAM <=2 SENSITIVE Sensitive     PIP/TAZO <=4 SENSITIVE Sensitive     * >=100,000 COLONIES/mL ESCHERICHIA COLI  Culture, blood (Routine X 2) w Reflex to ID Panel     Status: None (Preliminary result)   Collection Time: 12/28/22  1:18 PM   Specimen: BLOOD  Result Value Ref Range Status   Specimen Description BLOOD SITE NOT SPECIFIED  Final   Special Requests   Final    BOTTLES DRAWN AEROBIC ONLY Blood Culture results may not be optimal due to an inadequate volume of blood received in culture bottles   Culture   Final    NO GROWTH < 24 HOURS Performed at [facility], [address] [place], [place] [address]    Report Status PENDING  Incomplete  Culture, blood (Routine X 2) w Reflex to ID Panel     Status: None (Preliminary result)   Collection Time: 12/28/22  1:22 PM   Specimen: BLOOD  Result Value Ref Range Status   Specimen Description BLOOD SITE NOT SPECIFIED  Final   Special Requests   Final    BOTTLES DRAWN AEROBIC ONLY Blood Culture results may not be optimal due to an inadequate volume of blood received in culture bottles   Culture   Final    NO GROWTH < 24 HOURS Performed at [facility], [address] [place], [place]  [address]    Report Status PENDING  Incomplete    FURTHER DISCHARGE INSTRUCTIONS:  Get Medicines reviewed and adjusted: Please take all your medications with you for your next visit with your Primary MD  Laboratory/radiological data: Please request your Primary MD to go over all hospital tests and procedure/radiological results at the follow up, please ask your Primary MD to get all Hospital records sent to his/her office.  In some cases, they will be blood work, cultures and biopsy results pending at the time of your discharge. Please request that your primary care M.D. goes through all the records of your hospital data and follows up on these results.  Also Note the following: If you experience worsening of your admission symptoms, develop shortness of breath, life threatening emergency, suicidal or homicidal thoughts you must seek medical attention immediately by calling 911 or calling your MD immediately  if symptoms less severe.  You must read complete instructions/literature along with all the possible adverse reactions/side effects for all the Medicines you take and that have been prescribed to you. Take any new Medicines after you have completely understood and accpet all the possible adverse reactions/side effects.   Do not drive when taking Pain medications or sleeping medications (Benzodaizepines)  Do not take more than prescribed Pain, Sleep and Anxiety Medications. It is not advisable to combine anxiety,sleep and pain medications without talking with your primary care practitioner  Special Instructions: If you have smoked or chewed Tobacco  in the last 2 yrs please stop smoking, stop any regular Alcohol  and or any Recreational drug use.  Wear Seat belts while driving.  Please note: You were cared for by a hospitalist during your hospital stay. Once you are discharged, your primary care physician will handle any  further medical issues. Please note that NO REFILLS for any  discharge medications will be authorized once you are discharged, as it is imperative that you return to your primary care physician (or establish a relationship with a primary care physician if you do not have one) for your post hospital discharge needs so that they can reassess your need for medications and monitor your lab values.  Total Time spent coordinating discharge including counseling, education and face to face time equals greater than 30 minutes.  SignedJeoffrey Massed 12/30/2022 9:42 AM

## 2023-01-01 LAB — CULTURE, BLOOD (ROUTINE X 2)
Culture: NO GROWTH
Culture: NO GROWTH
Culture: NO GROWTH

## 2023-01-02 LAB — CULTURE, BLOOD (ROUTINE X 2)

## 2023-01-12 ENCOUNTER — Telehealth: Payer: Self-pay

## 2023-01-12 NOTE — Transitions of Care (Post Inpatient/ED Visit) (Signed)
   01/12/2023  Name: Kathryn Wall MRN: [id number] DOB: August 19, 1945  Today's TOC FU Call Status: Today's TOC FU Call Status:: Unsuccessul Call (1st Attempt) Unsuccessful Call (1st Attempt) Date: 01/12/23  Attempted to reach the patient regarding the most recent Inpatient/ED visit.  Follow Up Plan: Additional outreach attempts will be made to reach the patient to complete the Transitions of Care (Post Inpatient/ED visit) call.   Signature Karena Addison, LPN [facility] Nurse Health Advisor Direct Dial [phone]

## 2023-01-13 NOTE — Transitions of Care (Post Inpatient/ED Visit) (Unsigned)
   01/13/2023  Name: Kathryn Wall MRN: [id number] DOB: 08-12-1945  Today's TOC FU Call Status: Today's TOC FU Call Status:: Unsuccessful Call (2nd Attempt) Unsuccessful Call (1st Attempt) Date: 01/12/23 Unsuccessful Call (2nd Attempt) Date: 01/13/23  Attempted to reach the patient regarding the most recent Inpatient/ED visit.  Follow Up Plan: Additional outreach attempts will be made to reach the patient to complete the Transitions of Care (Post Inpatient/ED visit) call.   Signature Karena Addison, LPN [facility] Nurse Health Advisor Direct Dial [phone]

## 2023-01-17 NOTE — Transitions of Care (Post Inpatient/ED Visit) (Signed)
   01/17/2023  Name: Kathryn Wall MRN: [id number] DOB: January 06, 1946  Today's TOC FU Call Status: Today's TOC FU Call Status:: Unsuccessful Call (3rd Attempt) Unsuccessful Call (1st Attempt) Date: 01/12/23 Unsuccessful Call (2nd Attempt) Date: 01/13/23 Unsuccessful Call (3rd Attempt) Date: 01/17/23  Attempted to reach the patient regarding the most recent Inpatient/ED visit.  Follow Up Plan: No further outreach attempts will be made at this time. We have been unable to contact the patient.  Signature Karena Addison, LPN [facility] Nurse Health Advisor Direct Dial [phone]

## 2023-01-24 ENCOUNTER — Inpatient Hospital Stay: Payer: Medicare Other | Admitting: Family Medicine

## 2023-01-28 ENCOUNTER — Encounter: Payer: Self-pay | Admitting: Family Medicine

## 2023-01-28 ENCOUNTER — Ambulatory Visit: Payer: Medicare Other | Admitting: Family Medicine

## 2023-01-28 VITALS — BP 120/72 | HR 70 | Temp 98.5°F | Ht 65.0 in | Wt 145.0 lb

## 2023-01-28 DIAGNOSIS — E785 Hyperlipidemia, unspecified: Secondary | ICD-10-CM | POA: Diagnosis not present

## 2023-01-28 DIAGNOSIS — E1169 Type 2 diabetes mellitus with other specified complication: Secondary | ICD-10-CM

## 2023-01-28 DIAGNOSIS — I10 Essential (primary) hypertension: Secondary | ICD-10-CM | POA: Diagnosis not present

## 2023-01-28 DIAGNOSIS — R413 Other amnesia: Secondary | ICD-10-CM | POA: Diagnosis not present

## 2023-01-28 DIAGNOSIS — Z7984 Long term (current) use of oral hypoglycemic drugs: Secondary | ICD-10-CM

## 2023-01-28 LAB — CBC WITH DIFFERENTIAL/PLATELET
Absolute Monocytes: 408 cells/uL (ref 200–950)
Basophils Absolute: 82 cells/uL (ref 0–200)
Basophils Relative: 1.2 %
Eosinophils Absolute: 428 cells/uL (ref 15–500)
Eosinophils Relative: 6.3 %
HCT: 40.4 % (ref 35.0–45.0)
Hemoglobin: 13 g/dL (ref 11.7–15.5)
Lymphs Abs: 1659 cells/uL (ref 850–3900)
MCH: 30.4 pg (ref 27.0–33.0)
MCHC: 32.2 g/dL (ref 32.0–36.0)
MCV: 94.6 fL (ref 80.0–100.0)
MPV: 12.1 fL (ref 7.5–12.5)
Monocytes Relative: 6 %
Neutro Abs: 4223 cells/uL (ref 1500–7800)
Neutrophils Relative %: 62.1 %
Platelets: 319 10*3/uL (ref 140–400)
RBC: 4.27 10*6/uL (ref 3.80–5.10)
RDW: 12.7 % (ref 11.0–15.0)
Total Lymphocyte: 24.4 %
WBC: 6.8 10*3/uL (ref 3.8–10.8)

## 2023-01-28 NOTE — Progress Notes (Signed)
Subjective:    Patient ID: Kathryn Wall, female    DOB: [birth year], 77 y.o.   MRN: [id number] Admit Date: 12/26/2022 Discharge date: 12/30/2022    Brief Summary: Patient is a 77 y.o.  female with history of HTN, HLD, DM-2, PAF on Eliquis, breast cancer-s/p lumpectomy/radiation-presented with abdominal pain, along with nausea, vomiting and diarrhea.   Significant events: 6/30>> admit to [facility]   Significant studies: 7/1>> CT angio chest abdomen/pelvis: No evidence of aneurysm/dissection/PE.  No acute findings. 7/1>> RUQ ultrasound: No significant abnormality 7/1>> acute hepatitis serology: Negative   Significant microbiology data: 7/1>> blood culture: No growth 7/1>> urine culture: E. coli 7/2>> blood culture: No growth   Procedures: None   Consults: GI   Brief Hospital Course: Abdominal pain with nausea/vomiting/diarrhea ?  Viral gastroenteritis Abdominal pain/nausea/vomiting and diarrhea have all resolved. CT imaging nonacute   Transaminitis Probably reflective of viral gastroenteritis-mild rhabdomyolysis CT abdomen/RUQ ultrasound nonacute (s/p cholecystectomy) LFTs improving-continue to trend LFTs as an outpatient-suggest repeat in 1 week   Leukocytosis Likely secondary to gastroenteritis WBC has essentially normalized without the use of any antibiotics Cultures negative so far.     Rhabdomyolysis Mild Nontraumatic CK levels have improved significantly.   Delirium Chronic cognitive dysfunction-probably dementia Reviewed prior notes-has seen outpatient neurology-suspicion for some chronic cognitive issues at baseline Suspect had some delirium related to acute illness over the past several days-but much better-hardly any confusion on my evaluation this morning-answering most of my questions appropriately. Delirium precautions Suspect as LFTs/leukocytosis continues to improve-she will likely improve further. Spoke with daughter-catheter this morning-per  her-patient's mentation much improved and close to baseline.   CKD stage IIIa At baseline   Hypokalemia Improving-replete prior to discharge today.   Asymptomatic bacteriuria Does not have any symptoms consistent with UTI.  No indication to treat at this point. See above.   HLD Statin held due to transaminitis/rhabdomyolysis-okay to resume once LFTs improve further-recheck LFTs in 1 week.   HTN BP stable Norvasc/metoprolol/losartan   PAF Telemetry monitoring Metoprolol Continue Eliquis.   Type II DM CBG stable with SSI while inpatient Resume oral hypoglycemic agents on discharge   Chronic anxiety/depression Continue Paxil/Seroquel/venlafaxine   GERD PPI   Debility/deconditioning PT/OT eval-SNF recommended.  Since medically stable-improved-stable to transfer today.  Family-daughter Olegario Messier aware.    01/28/23 Patient states that since she has been home from the hospital she is still tired.  She denies any chest pain.  She denies any shortness of breath.  She denies any irregular heartbeats.  She just feels weak and tired at times.  She does not feel like she has enough energy.  She wants to stop metformin because she believes that may be playing a role in this.  She is taking atorvastatin.  She went back on this independently after she left the rehab facility.  No one to date has rechecked her potassium and her liver function test.  Of note she is taking paroxetine and venlafaxine.  When I last saw the patient in February I want her to stop the paroxetine and replace it with venlafaxine.  Otherwise she.  She denies any nausea or vomiting or diarrhea.  She denies any hypoglycemic episodes. Past Medical History:  Diagnosis Date   Anxiety    Breast cancer (HCC) 06/29/1995   AGE 57, BRCA 1 NEGATIVE 2. 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    CKD (chronic kidney disease), stage III (HCC)    Cognitive deficits    mild  Depression    Diabetes mellitus    TYPE II    High cholesterol    Hypertension    PAF (paroxysmal atrial fibrillation) [facility])    Past Surgical History:  Procedure Laterality Date   ABDOMINAL HYSTERECTOMY  06/29/1983   TAH   ANKLE SURGERY Right    APPLICATION OF WOUND VAC Left 01/14/2022   Procedure: APPLICATION OF WOUND VAC;  Surgeon: Peggye Form, DO;  Location: [facility] OR;  Service: Plastics;  Laterality: Left;   BREAST SURGERY  06/29/1995   RIGHT BREAST LUMPECTOMY   CHOLECYSTECTOMY  06/28/1978   COLONOSCOPY     Current Outpatient Medications on File Prior to Visit  Medication Sig Dispense Refill   acetaminophen (TYLENOL) 500 MG tablet Take 1,000 mg by mouth daily as needed for moderate pain, fever or headache.     amLODipine (NORVASC) 5 MG tablet TAKE 1 TABLET BY MOUTH ONCE A DAY (Patient taking differently: Take 5 mg by mouth daily.) 90 tablet 3   atorvastatin (LIPITOR) 40 MG tablet TAKE ONE TABLET BY MOUTH ONCE A DAY 90 tablet 2   calcium carbonate (OS-CAL - DOSED IN MG OF ELEMENTAL CALCIUM) 1250 (500 Ca) MG tablet Take 1 tablet (1,250 mg total) by mouth 2 (two) times daily with a meal. (Patient not taking: Reported on 12/29/2022)     cyanocobalamin (VITAMIN B12) 1000 MCG tablet Take 1 tablet (1,000 mcg total) by mouth daily. (Patient not taking: Reported on 12/29/2022)     ELIQUIS 5 MG TABS tablet TAKE 1 TABLET BY MOUTH TWICE A DAY (Patient taking differently: Take 5 mg by mouth 2 (two) times daily.) 180 tablet 3   JARDIANCE 25 MG TABS tablet TAKE 1 TABLET BY MOUTH ONCE A DAY (Patient taking differently: Take 25 mg by mouth daily.) 90 tablet 3   losartan (COZAAR) 50 MG tablet TAKE ONE TABLET BY MOUTH ONCE A DAY 100 tablet 0   metFORMIN (GLUCOPHAGE-XR) 500 MG 24 hr tablet TAKE 4 TABLETS BY MOUTH ONCE DAILY WITH BREAKFAST. (Patient taking differently: Take 2,000 mg by mouth daily with breakfast.) 360 tablet 3   metoprolol succinate (TOPROL-XL) 25 MG 24 hr tablet TAKE ONE TABLET BY MOUTH DAILY 90 tablet 1   Multiple  Vitamins-Minerals (ONE A DAY WOMEN 50 PLUS) CHEW Chew 1 each by mouth daily.     MYRBETRIQ 25 MG TB24 tablet TAKE ONE TABLET BY MOUTH DAILY 30 tablet 3   ondansetron (ZOFRAN-ODT) 4 MG disintegrating tablet Take 4 mg by mouth every 8 (eight) hours as needed for nausea or vomiting.     pantoprazole (PROTONIX) 40 MG tablet TAKE ONE TABLET BY MOUTH EVERY MORNING (Patient taking differently: Take 40 mg by mouth daily.) 90 tablet 2   PARoxetine (PAXIL) 20 MG tablet TAKE ONE TABLET BY MOUTH ONCE A DAY 90 tablet 2   QUEtiapine (SEROQUEL) 50 MG tablet TAKE ONE TABLET BY MOUTH EVERY NIGHT AT BEDTIME 30 tablet 0   venlafaxine XR (EFFEXOR-XR) 75 MG 24 hr capsule TAKE ONE CAPSULE BY MOUTH ONCE DAILY WITH BREAKFAST (Patient taking differently: Take 75 mg by mouth daily with breakfast.) 90 capsule 2   No current facility-administered medications on file prior to visit.   Allergies  Allergen Reactions   Ambien [Zolpidem Tartrate] Other (See Comments)    Sleep walking   Cipro [Ciprofloxacin Hcl] Nausea Only   Cymbalta [Duloxetine Hcl] Other (See Comments)    Unknown Reaction   Other     SENSITIVE TO ANTIBIOTICS   Cardura [Doxazosin]  Nausea And Vomiting   Percocet [Oxycodone-Acetaminophen] Nausea And Vomiting   Social History   Socioeconomic History   Marital status: Married    Spouse name: Not on file   Number of children: Not on file   Years of education: Not on file   Highest education level: Not on file  Occupational History   Not on file  Tobacco Use   Smoking status: Never   Smokeless tobacco: Never  Vaping Use   Vaping status: Never Used  Substance and Sexual Activity   Alcohol use: No   Drug use: No   Sexual activity: Yes    Birth control/protection: Post-menopausal, Surgical    Comment: hysterectomy  Other Topics Concern   Not on file  Social History Narrative   Not on file   Social Determinants of Health   Financial Resource Strain: Not on file  Food Insecurity: No Food  Insecurity (12/29/2022)   Hunger Vital Sign    Worried About Running Out of Food in the Last Year: Never true    Ran Out of Food in the Last Year: Never true  Transportation Needs: No Transportation Needs (12/29/2022)   PRAPARE - Administrator, Civil Service (Medical): No    Lack of Transportation (Non-Medical): No  Physical Activity: Not on file  Stress: Not on file  Social Connections: Unknown (08/17/2022)   Received from [facility]   Social Network    Social Network: Not on file  Intimate Partner Violence: Not At Risk (12/29/2022)   Humiliation, Afraid, Rape, and Kick questionnaire    Fear of Current or Ex-Partner: No    Emotionally Abused: No    Physically Abused: No    Sexually Abused: No   Family History  Problem Relation Age of Onset   Cancer Mother        COLON   Hypertension Father    Heart disease Father       Review of Systems  All other systems reviewed and are negative.      Objective:   Physical Exam Vitals reviewed.  Constitutional:      General: She is not in acute distress.    Appearance: She is well-developed. She is not diaphoretic.  HENT:     Head: Normocephalic and atraumatic.     Right Ear: External ear normal.     Left Ear: External ear normal.     Nose: Nose normal.     Mouth/Throat:     Pharynx: No oropharyngeal exudate.  Eyes:     General: No scleral icterus.       Right eye: No discharge.        Left eye: No discharge.     Conjunctiva/sclera: Conjunctivae normal.     Pupils: Pupils are equal, round, and reactive to light.  Neck:     Thyroid: No thyromegaly.     Trachea: No tracheal deviation.  Cardiovascular:     Rate and Rhythm: Normal rate and regular rhythm.     Heart sounds: Normal heart sounds. No murmur heard.    No friction rub. No gallop.  Pulmonary:     Effort: Pulmonary effort is normal. No respiratory distress.     Breath sounds: Normal breath sounds. No stridor. No wheezing or rales.  Abdominal:      General: Bowel sounds are normal. There is no distension.     Palpations: Abdomen is soft. There is no mass.     Tenderness: There is no abdominal tenderness. There is  no guarding or rebound.  Musculoskeletal:     Cervical back: Normal range of motion and neck supple.  Lymphadenopathy:     Cervical: No cervical adenopathy.  Skin:    General: Skin is warm.     Coloration: Skin is not pale.     Findings: No erythema or rash.  Neurological:     Mental Status: She is alert and oriented to person, place, and time.     Cranial Nerves: No cranial nerve deficit.     Motor: No abnormal muscle tone.     Coordination: Coordination normal.     Deep Tendon Reflexes: Reflexes normal.  Psychiatric:        Mood and Affect: Mood normal.        Speech: Speech normal.        Behavior: Behavior normal.        Thought Content: Thought content normal.        Judgment: Judgment normal.           Assessment & Plan:  Type 2 diabetes mellitus with hyperlipidemia (HCC) - Plan: Hemoglobin A1c, CBC with Differential/Platelet, COMPLETE METABOLIC PANEL WITH GFR  Memory loss  Essential hypertension, benign Patient's memory loss seems stable.  It has not progressed over the last year.  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  She seems like she is doing very well.  I am concerned that her blood pressure may be dropping causing the fatigue.  Therefore I asked her to check her blood pressure every day.  We may need to decrease some of her medication if it is low at home.  I will check an A1c today.  If well below 7 we may just discontinue metformin and not replace it with anything.  If we need to replace metformin we discussed her options and she prefers Actos due to cost.  Already taking Jardiance.  I will check a CBC and a CMP today.  If her liver function test have normalized I will have the patient resume atorvastatin.  I reviewed the ultrasound and  CT angiogram from the hospital along with a viral hepatitis panel and her CMP and CBC

## 2023-02-03 DIAGNOSIS — E860 Dehydration: Secondary | ICD-10-CM | POA: Insufficient documentation

## 2023-02-10 ENCOUNTER — Ambulatory Visit (INDEPENDENT_AMBULATORY_CARE_PROVIDER_SITE_OTHER): Payer: Medicare Other

## 2023-02-10 VITALS — Ht 65.0 in | Wt 145.0 lb

## 2023-02-10 DIAGNOSIS — Z0000 Encounter for general adult medical examination without abnormal findings: Secondary | ICD-10-CM | POA: Diagnosis not present

## 2023-02-10 NOTE — Progress Notes (Signed)
Subjective:   Kathryn Wall is a 77 y.o. female who presents for Medicare Annual (Subsequent) preventive examination.  Visit Complete: Virtual  I connected with  [name] on 02/10/23 by a audio enabled telemedicine application and verified that I am speaking with the correct person using two identifiers.  Patient Location: Home  Provider Location: Home Office  I discussed the limitations of evaluation and management by telemedicine. The patient expressed understanding and agreed to proceed.  Vital Signs: Because this visit was a virtual/telehealth visit, some criteria may be missing or patient reported. Any vitals not documented were not able to be obtained and vitals that have been documented are patient reported.   Review of Systems     Cardiac Risk Factors include: advanced age (>35men, >25 women);diabetes mellitus;dyslipidemia;hypertension     Objective:    Today's Vitals   02/10/23 1550  Weight: 145 lb (65.8 kg)  Height: 5\' 5"  (1.651 m)   Body mass index is 24.13 kg/m.     02/10/2023    3:58 PM 03/24/2022   11:08 PM 03/24/2022   11:00 PM 03/23/2022    6:12 PM 01/26/2022    9:50 PM 01/14/2022    7:28 AM 12/31/2020    4:40 PM  Advanced Directives  Does Patient Have a Medical Advance Directive? Yes Yes  Yes No Yes No  Type of Estate agent of [place];Living will  Living will;Healthcare Power of Attorney Living will;Healthcare Power of [employer] Power of [place];Living will Healthcare Power of [place];Living will   Does patient want to make changes to medical advance directive? No - Patient declined No - Patient declined    No - Patient declined   Copy of Healthcare Power of Attorney in Chart? Yes - validated most recent copy scanned in chart (See row information)  No - copy requested   No - copy requested   Would patient like information on creating a medical advance directive?       No - Patient declined    Current Medications  (verified) Outpatient Encounter Medications as of 02/10/2023  Medication Sig   acetaminophen (TYLENOL) 500 MG tablet Take 1,000 mg by mouth daily as needed for moderate pain, fever or headache.   amLODipine (NORVASC) 5 MG tablet TAKE 1 TABLET BY MOUTH ONCE A DAY (Patient taking differently: Take 5 mg by mouth daily.)   atorvastatin (LIPITOR) 40 MG tablet TAKE ONE TABLET BY MOUTH ONCE A DAY   ELIQUIS 5 MG TABS tablet TAKE 1 TABLET BY MOUTH TWICE A DAY (Patient taking differently: Take 5 mg by mouth 2 (two) times daily.)   JARDIANCE 25 MG TABS tablet TAKE 1 TABLET BY MOUTH ONCE A DAY (Patient taking differently: Take 25 mg by mouth daily.)   losartan (COZAAR) 50 MG tablet TAKE ONE TABLET BY MOUTH ONCE A DAY   metFORMIN (GLUCOPHAGE-XR) 500 MG 24 hr tablet TAKE 4 TABLETS BY MOUTH ONCE DAILY WITH BREAKFAST. (Patient taking differently: Take 2,000 mg by mouth daily with breakfast.)   metoprolol succinate (TOPROL-XL) 25 MG 24 hr tablet TAKE ONE TABLET BY MOUTH DAILY   Multiple Vitamins-Minerals (ONE A DAY WOMEN 50 PLUS) CHEW Chew 1 each by mouth daily.   MYRBETRIQ 25 MG TB24 tablet TAKE ONE TABLET BY MOUTH DAILY   ondansetron (ZOFRAN-ODT) 4 MG disintegrating tablet Take 4 mg by mouth every 8 (eight) hours as needed for nausea or vomiting.   pantoprazole (PROTONIX) 40 MG tablet TAKE ONE TABLET BY MOUTH EVERY MORNING (  Patient taking differently: Take 40 mg by mouth daily.)   QUEtiapine (SEROQUEL) 50 MG tablet TAKE ONE TABLET BY MOUTH EVERY NIGHT AT BEDTIME   venlafaxine XR (EFFEXOR-XR) 75 MG 24 hr capsule TAKE ONE CAPSULE BY MOUTH ONCE DAILY WITH BREAKFAST (Patient taking differently: Take 75 mg by mouth daily with breakfast.)   [DISCONTINUED] calcium carbonate (OS-CAL - DOSED IN MG OF ELEMENTAL CALCIUM) 1250 (500 Ca) MG tablet Take 1 tablet (1,250 mg total) by mouth 2 (two) times daily with a meal.   [DISCONTINUED] cyanocobalamin (VITAMIN B12) 1000 MCG tablet Take 1 tablet (1,000 mcg total) by mouth daily.    No facility-administered encounter medications on file as of 02/10/2023.    Allergies (verified) Ambien [zolpidem tartrate], Cipro [ciprofloxacin hcl], Cymbalta [duloxetine hcl], Other, Cardura [doxazosin], and Percocet [oxycodone-acetaminophen]   History: Past Medical History:  Diagnosis Date   Anxiety    Breast cancer (HCC) 06/29/1995   AGE 1, BRCA 1 NEGATIVE 2. 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    CKD (chronic kidney disease), stage III (HCC)    Cognitive deficits    mild   Depression    Diabetes mellitus    TYPE II   High cholesterol    Hypertension    PAF (paroxysmal atrial fibrillation) (HCC)    Past Surgical History:  Procedure Laterality Date   ABDOMINAL HYSTERECTOMY  06/29/1983   TAH   ANKLE SURGERY Right    APPLICATION OF WOUND VAC Left 01/14/2022   Procedure: APPLICATION OF WOUND VAC;  Surgeon: Peggye Form, DO;  Location: [facility] OR;  Service: Plastics;  Laterality: Left;   BREAST SURGERY  06/29/1995   RIGHT BREAST LUMPECTOMY   CHOLECYSTECTOMY  06/28/1978   COLONOSCOPY     Family History  Problem Relation Age of Onset   Cancer Mother        COLON   Hypertension Father    Heart disease Father    Social History   Socioeconomic History   Marital status: Married    Spouse name: Not on file   Number of children: Not on file   Years of education: Not on file   Highest education level: Not on file  Occupational History   Not on file  Tobacco Use   Smoking status: Never   Smokeless tobacco: Never  Vaping Use   Vaping status: Never Used  Substance and Sexual Activity   Alcohol use: No   Drug use: No   Sexual activity: Yes    Birth control/protection: Post-menopausal, Surgical    Comment: hysterectomy  Other Topics Concern   Not on file  Social History Narrative   Not on file   Social Determinants of Health   Financial Resource Strain: Low Risk  (02/10/2023)   Overall Financial Resource Strain (CARDIA)    Difficulty of  Paying Living Expenses: Not hard at all  Food Insecurity: No Food Insecurity (02/10/2023)   Hunger Vital Sign    Worried About Running Out of Food in the Last Year: Never true    Ran Out of Food in the Last Year: Never true  Transportation Needs: No Transportation Needs (02/10/2023)   PRAPARE - Administrator, Civil Service (Medical): No    Lack of Transportation (Non-Medical): No  Physical Activity: Insufficiently Active (02/10/2023)   Exercise Vital Sign    Days of Exercise per Week: 3 days    Minutes of Exercise per Session: 30 min  Stress: No Stress Concern Present (02/10/2023)   [place]  [employer] - Occupational Stress Questionnaire    Feeling of Stress : Not at all  Social Connections: Moderately Integrated (02/10/2023)   Social Connection and Isolation Panel [NHANES]    Frequency of Communication with Friends and Family: More than three times a week    Frequency of Social Gatherings with Friends and Family: Three times a week    Attends Religious Services: 1 to 4 times per year    Active Member of Clubs or Organizations: No    Attends Banker Meetings: Never    Marital Status: Married    Tobacco Counseling Counseling given: Not Answered   Clinical Intake:  Pre-visit preparation completed: Yes  Pain : No/denies pain     Diabetes: Yes CBG done?: No Did pt. bring in CBG monitor from home?: No  How often do you need to have someone help you when you read instructions, pamphlets, or other written materials from your doctor or pharmacy?: 1 - Never  Interpreter Needed?: No  Information entered by :: Kandis Fantasia LPN   Activities of Daily Living    02/10/2023    3:58 PM 03/24/2022    9:34 PM  In your present state of health, do you have any difficulty performing the following activities:  Hearing? 0 0  Vision? 0 0  Difficulty concentrating or making decisions? 0 0  Walking or climbing stairs? 0 1  Dressing or bathing?  0 0  Doing errands, shopping? 0 0  Preparing Food and eating ? N   Using the Toilet? N   In the past six months, have you accidently leaked urine? N   Do you have problems with loss of bowel control? N   Managing your Medications? N   Managing your Finances? N   Housekeeping or managing your Housekeeping? N     Patient Care Team: Donita Brooks, MD as PCP - General (Family Medicine) Meriam Sprague, MD as PCP - Cardiology (Cardiology) Center, [facility]  Indicate any recent Medical Services you may have received from other than [facility] providers in the past year (date may be approximate).     Assessment:   This is a routine wellness examination for [place].  Hearing/Vision screen Hearing Screening - Comments:: Denies hearing difficulties   Vision Screening - Comments:: Wears rx glasses - up to date with routine eye exams with [facility]    Dietary issues and exercise activities discussed:     Goals Addressed             This Visit's Progress    Remain active and independent         Depression Screen    02/10/2023    3:53 PM 08/05/2022    2:54 PM 12/15/2021    2:59 PM 02/19/2021    9:21 PM 11/22/2019    3:05 PM 08/11/2018    3:05 PM 03/17/2017   11:59 AM  PHQ 2/9 Scores  PHQ - 2 Score 0 0 0  0 0 0  PHQ- 9 Score   0         Information is confidential and restricted. Go to Review Flowsheets to unlock data.    Fall Risk    02/10/2023    3:58 PM 08/05/2022    2:54 PM 12/15/2021    2:55 PM 08/11/2020    3:11 PM 11/22/2019    3:05 PM  Fall Risk   Falls in the past year? 0 1 1 0 1  Number falls  in past yr: 0 1 1 0 0  Injury with Fall? 0 1 1 0 1  Comment   broke-left side, steel foot put in with 6 screws, 3/23, trip over her foot coming out of the bathroom  fx ankle L  Risk for fall due to : Impaired mobility History of fall(s);Impaired balance/gait;Orthopedic patient   History of fall(s)  Follow up Falls prevention discussed;Education provided;Falls  evaluation completed Education provided;Falls prevention discussed   Follow up appointment    MEDICARE RISK AT HOME:  Medicare Risk at Home - 02/10/23 1558     Any stairs in or around the home? No    If so, are there any without handrails? No    Home free of loose throw rugs in walkways, pet beds, electrical cords, etc? Yes    Adequate lighting in your home to reduce risk of falls? Yes    Life alert? No    Use of a cane, walker or w/c? No    Grab bars in the bathroom? Yes    Shower chair or bench in shower? No    Elevated toilet seat or a handicapped toilet? Yes             TIMED UP AND GO:  Was the test performed?  No    Cognitive Function:    12/16/2020    2:00 PM  MMSE - Mini Mental State Exam  Orientation to time 5  Orientation to Place 5  Registration 3  Attention/ Calculation 4  Recall 3  Language- name 2 objects 2  Language- repeat 1  Language- follow 3 step command 3  Language- read & follow direction 1  Write a sentence 1  Copy design 1  Total score 29        02/10/2023    3:58 PM 08/11/2020    3:11 PM  6CIT Screen  What Year? 0 points 0 points  What month? 0 points 0 points  What time? 0 points 0 points  Count back from 20 0 points 0 points  Months in reverse 0 points 0 points  Repeat phrase 0 points 0 points  Total Score 0 points 0 points    Immunizations Immunization History  Administered Date(s) Administered   Fluad Quad(high Dose 65+) 05/03/2019, 03/27/2022   Influenza Split 05/31/2011   Influenza, High Dose Seasonal PF 04/30/2013, 03/17/2017   Influenza,inj,Quad PF,6+ Mos 04/21/2015, 05/25/2016   PFIZER(Purple Top)SARS-COV-2 Vaccination 07/18/2019, 08/08/2019   Pneumococcal Conjugate-13 08/08/2013, 10/18/2013   Pneumococcal Polysaccharide-23 05/31/2011   Zoster Recombinant(Shingrix) 11/22/2020, 02/04/2021    TDAP status: Due, Education has been provided regarding the importance of this vaccine. Advised may receive this vaccine at  local pharmacy or Health Dept. Aware to provide a copy of the vaccination record if obtained from local pharmacy or Health Dept. Verbalized acceptance and understanding.  Flu Vaccine status: Due, Education has been provided regarding the importance of this vaccine. Advised may receive this vaccine at local pharmacy or Health Dept. Aware to provide a copy of the vaccination record if obtained from local pharmacy or Health Dept. Verbalized acceptance and understanding.  Pneumococcal vaccine status: Up to date  Covid-19 vaccine status: Information provided on how to obtain vaccines.   Qualifies for Shingles Vaccine? Yes   Zostavax completed No   Shingrix Completed?: No.    Education has been provided regarding the importance of this vaccine. Patient has been advised to call insurance company to determine out of pocket expense if they have not yet received  this vaccine. Advised may also receive vaccine at local pharmacy or Health Dept. Verbalized acceptance and understanding.  Screening Tests Health Maintenance  Topic Date Due   COVID-19 Vaccine (3 - Pfizer risk series) 09/05/2019   MAMMOGRAM  06/09/2021   OPHTHALMOLOGY EXAM  10/29/2021   FOOT EXAM  12/16/2022   INFLUENZA VACCINE  01/27/2023   HEMOGLOBIN A1C  07/31/2023   Diabetic kidney evaluation - Urine ACR  08/06/2023   Diabetic kidney evaluation - eGFR measurement  01/28/2024   Medicare Annual Wellness (AWV)  02/10/2024   Pneumonia Vaccine 69+ Years old  Completed   DEXA SCAN  Completed   Hepatitis C Screening  Completed   Zoster Vaccines- Shingrix  Completed   HPV VACCINES  Aged Out   DTaP/Tdap/Td  Discontinued   Colonoscopy  Discontinued    Health Maintenance  Health Maintenance Due  Topic Date Due   COVID-19 Vaccine (3 - Pfizer risk series) 09/05/2019   MAMMOGRAM  06/09/2021   OPHTHALMOLOGY EXAM  10/29/2021   FOOT EXAM  12/16/2022   INFLUENZA VACCINE  01/27/2023    Colorectal cancer screening: No longer required.    Mammogram status: No longer required due to age and preference.  Bone Density status: Completed 10/23/20. Results reflect: Bone density results: OSTEOPENIA. Repeat every 2 years.  Lung Cancer Screening: (Low Dose CT Chest recommended if Age 79-80 years, 20 pack-year currently smoking OR have quit w/in 15years.) does not qualify.   Lung Cancer Screening Referral: n/a  Additional Screening:  Hepatitis C Screening: does qualify; Completed 11/27/22  Vision Screening: Recommended annual ophthalmology exams for early detection of glaucoma and other disorders of the eye. Is the patient up to date with their annual eye exam?  Yes  Who is the provider or what is the name of the office in which the patient attends annual eye exams? [facility]  If pt is not established with a provider, would they like to be referred to a provider to establish care? No .   Dental Screening: Recommended annual dental exams for proper oral hygiene  Diabetic Foot Exam: Diabetic Foot Exam: Overdue, Pt has been advised about the importance in completing this exam. Pt is scheduled for diabetic foot exam on at next office visit.  Community Resource Referral / Chronic Care Management: CRR required this visit?  No   CCM required this visit?  No     Plan:     I have personally reviewed and noted the following in the patient's chart:   Medical and social history Use of alcohol, tobacco or illicit drugs  Current medications and supplements including opioid prescriptions. Patient is not currently taking opioid prescriptions. Functional ability and status Nutritional status Physical activity Advanced directives List of other physicians Hospitalizations, surgeries, and ER visits in previous 12 months Vitals Screenings to include cognitive, depression, and falls Referrals and appointments  In addition, I have reviewed and discussed with patient certain preventive protocols, quality metrics, and best  practice recommendations. A written personalized care plan for preventive services as well as general preventive health recommendations were provided to patient.     Kandis Fantasia [place], [place]   [id number]/2956   After Visit Summary: (Mail) Due to this being a telephonic visit, the after visit summary with patients personalized plan was offered to patient via mail   Nurse Notes: No concerns at this time

## 2023-02-10 NOTE — Patient Instructions (Addendum)
Kathryn Wall , Thank you for taking time to come for your Medicare Wellness Visit. I appreciate your ongoing commitment to your health goals. Please review the following plan we discussed and let me know if I can assist you in the future.   Referrals/Orders/Follow-Ups/Clinician Recommendations: Aim for 30 minutes of exercise or brisk walking, 6-8 glasses of water, and 5 servings of fruits and vegetables each day.  This is a list of the screening recommended for you and due dates:  Health Maintenance  Topic Date Due   COVID-19 Vaccine (3 - Pfizer risk series) 09/05/2019   Mammogram  06/09/2021   Eye exam for diabetics  10/29/2021   Complete foot exam   12/16/2022   Flu Shot  01/27/2023   Hemoglobin A1C  07/31/2023   Yearly kidney health urinalysis for diabetes  08/06/2023   Yearly kidney function blood test for diabetes  01/28/2024   Medicare Annual Wellness Visit  02/10/2024   Pneumonia Vaccine  Completed   DEXA scan (bone density measurement)  Completed   Hepatitis C Screening  Completed   Zoster (Shingles) Vaccine  Completed   HPV Vaccine  Aged Out   DTaP/Tdap/Td vaccine  Discontinued   Colon Cancer Screening  Discontinued    Advanced directives: (In Chart) A copy of your advanced directives are scanned into your chart should your provider ever need it.  Next Medicare Annual Wellness Visit scheduled for next year: Yes  Preventive Care 79 Years and Older, Female Preventive care refers to lifestyle choices and visits with your health care provider that can promote health and wellness. What does preventive care include? A yearly physical exam. This is also called an annual well check. Dental exams once or twice a year. Routine eye exams. Ask your health care provider how often you should have your eyes checked. Personal lifestyle choices, including: Daily care of your teeth and gums. Regular physical activity. Eating a healthy diet. Avoiding tobacco and drug use. Limiting  alcohol use. Practicing safe sex. Taking low-dose aspirin every day. Taking vitamin and mineral supplements as recommended by your health care provider. What happens during an annual well check? The services and screenings done by your health care provider during your annual well check will depend on your age, overall health, lifestyle risk factors, and family history of disease. Counseling  Your health care provider may ask you questions about your: Alcohol use. Tobacco use. Drug use. Emotional well-being. Home and relationship well-being. Sexual activity. Eating habits. History of falls. Memory and ability to understand (cognition). Work and work Astronomer. Reproductive health. Screening  You may have the following tests or measurements: Height, weight, and BMI. Blood pressure. Lipid and cholesterol levels. These may be checked every 5 years, or more frequently if you are over 24 years old. Skin check. Lung cancer screening. You may have this screening every year starting at age 70 if you have a 30-pack-year history of smoking and currently smoke or have quit within the past 15 years. Fecal occult blood test (FOBT) of the stool. You may have this test every year starting at age 50. Flexible sigmoidoscopy or colonoscopy. You may have a sigmoidoscopy every 5 years or a colonoscopy every 10 years starting at age 44. Hepatitis C blood test. Hepatitis B blood test. Sexually transmitted disease (STD) testing. Diabetes screening. This is done by checking your blood sugar (glucose) after you have not eaten for a while (fasting). You may have this done every 1-3 years. Bone density scan. This is done  to screen for osteoporosis. You may have this done starting at age 25. Mammogram. This may be done every 1-2 years. Talk to your health care provider about how often you should have regular mammograms. Talk with your health care provider about your test results, treatment options, and if  necessary, the need for more tests. Vaccines  Your health care provider may recommend certain vaccines, such as: Influenza vaccine. This is recommended every year. Tetanus, diphtheria, and acellular pertussis (Tdap, Td) vaccine. You may need a Td booster every 10 years. Zoster vaccine. You may need this after age 41. Pneumococcal 13-valent conjugate (PCV13) vaccine. One dose is recommended after age 58. Pneumococcal polysaccharide (PPSV23) vaccine. One dose is recommended after age 84. Talk to your health care provider about which screenings and vaccines you need and how often you need them. This information is not intended to replace advice given to you by your health care provider. Make sure you discuss any questions you have with your health care provider. Document Released: 07/11/2015 Document Revised: 03/03/2016 Document Reviewed: 04/15/2015 Elsevier Interactive Patient Education  2017 [employer].  Fall Prevention in the Home Falls can cause injuries. They can happen to people of all ages. There are many things you can do to make your home safe and to help prevent falls. What can I do on the outside of my home? Regularly fix the edges of walkways and driveways and fix any cracks. Remove anything that might make you trip as you walk through a door, such as a raised step or threshold. Trim any bushes or trees on the path to your home. Use bright outdoor lighting. Clear any walking paths of anything that might make someone trip, such as rocks or tools. Regularly check to see if handrails are loose or broken. Make sure that both sides of any steps have handrails. Any raised decks and porches should have guardrails on the edges. Have any leaves, snow, or ice cleared regularly. Use sand or salt on walking paths during winter. Clean up any spills in your garage right away. This includes oil or grease spills. What can I do in the bathroom? Use night lights. Install grab bars by the toilet  and in the tub and shower. Do not use towel bars as grab bars. Use non-skid mats or decals in the tub or shower. If you need to sit down in the shower, use a plastic, non-slip stool. Keep the floor dry. Clean up any water that spills on the floor as soon as it happens. Remove soap buildup in the tub or shower regularly. Attach bath mats securely with double-sided non-slip rug tape. Do not have throw rugs and other things on the floor that can make you trip. What can I do in the bedroom? Use night lights. Make sure that you have a light by your bed that is easy to reach. Do not use any sheets or blankets that are too big for your bed. They should not hang down onto the floor. Have a firm chair that has side arms. You can use this for support while you get dressed. Do not have throw rugs and other things on the floor that can make you trip. What can I do in the kitchen? Clean up any spills right away. Avoid walking on wet floors. Keep items that you use a lot in easy-to-reach places. If you need to reach something above you, use a strong step stool that has a grab bar. Keep electrical cords out of the  way. Do not use floor polish or wax that makes floors slippery. If you must use wax, use non-skid floor wax. Do not have throw rugs and other things on the floor that can make you trip. What can I do with my stairs? Do not leave any items on the stairs. Make sure that there are handrails on both sides of the stairs and use them. Fix handrails that are broken or loose. Make sure that handrails are as long as the stairways. Check any carpeting to make sure that it is firmly attached to the stairs. Fix any carpet that is loose or worn. Avoid having throw rugs at the top or bottom of the stairs. If you do have throw rugs, attach them to the floor with carpet tape. Make sure that you have a light switch at the top of the stairs and the bottom of the stairs. If you do not have them, ask someone to add  them for you. What else can I do to help prevent falls? Wear shoes that: Do not have high heels. Have rubber bottoms. Are comfortable and fit you well. Are closed at the toe. Do not wear sandals. If you use a stepladder: Make sure that it is fully opened. Do not climb a closed stepladder. Make sure that both sides of the stepladder are locked into place. Ask someone to hold it for you, if possible. Clearly mark and make sure that you can see: Any grab bars or handrails. First and last steps. Where the edge of each step is. Use tools that help you move around (mobility aids) if they are needed. These include: Canes. Walkers. Scooters. Crutches. Turn on the lights when you go into a dark area. Replace any light bulbs as soon as they burn out. Set up your furniture so you have a clear path. Avoid moving your furniture around. If any of your floors are uneven, fix them. If there are any pets around you, be aware of where they are. Review your medicines with your doctor. Some medicines can make you feel dizzy. This can increase your chance of falling. Ask your doctor what other things that you can do to help prevent falls. This information is not intended to replace advice given to you by your health care provider. Make sure you discuss any questions you have with your health care provider. Document Released: 04/10/2009 Document Revised: 11/20/2015 Document Reviewed: 07/19/2014 Elsevier Interactive Patient Education  2017 [employer].

## 2023-02-12 ENCOUNTER — Encounter (HOSPITAL_COMMUNITY): Payer: Self-pay

## 2023-02-12 ENCOUNTER — Emergency Department (HOSPITAL_COMMUNITY): Payer: Medicare Other

## 2023-02-12 ENCOUNTER — Other Ambulatory Visit: Payer: Self-pay

## 2023-02-12 ENCOUNTER — Emergency Department (HOSPITAL_COMMUNITY)
Admission: EM | Admit: 2023-02-12 | Discharge: 2023-02-13 | Disposition: A | Discharge status: Home / Self Care | Payer: Medicare Other | Attending: Emergency Medicine | Admitting: Emergency Medicine

## 2023-02-12 DIAGNOSIS — S62525A Nondisplaced fracture of distal phalanx of left thumb, initial encounter for closed fracture: Secondary | ICD-10-CM

## 2023-02-12 DIAGNOSIS — Z79899 Other long term (current) drug therapy: Secondary | ICD-10-CM | POA: Diagnosis not present

## 2023-02-12 DIAGNOSIS — W010XXA Fall on same level from slipping, tripping and stumbling without subsequent striking against object, initial encounter: Secondary | ICD-10-CM | POA: Diagnosis not present

## 2023-02-12 DIAGNOSIS — E1122 Type 2 diabetes mellitus with diabetic chronic kidney disease: Secondary | ICD-10-CM | POA: Diagnosis not present

## 2023-02-12 DIAGNOSIS — S0990XA Unspecified injury of head, initial encounter: Secondary | ICD-10-CM | POA: Insufficient documentation

## 2023-02-12 DIAGNOSIS — S62515A Nondisplaced fracture of proximal phalanx of left thumb, initial encounter for closed fracture: Secondary | ICD-10-CM

## 2023-02-12 DIAGNOSIS — Y92481 Parking lot as the place of occurrence of the external cause: Secondary | ICD-10-CM | POA: Insufficient documentation

## 2023-02-12 DIAGNOSIS — W19XXXA Unspecified fall, initial encounter: Secondary | ICD-10-CM

## 2023-02-12 DIAGNOSIS — R2232 Localized swelling, mass and lump, left upper limb: Secondary | ICD-10-CM | POA: Diagnosis not present

## 2023-02-12 DIAGNOSIS — I4891 Unspecified atrial fibrillation: Secondary | ICD-10-CM | POA: Diagnosis not present

## 2023-02-12 DIAGNOSIS — S51011A Laceration without foreign body of right elbow, initial encounter: Secondary | ICD-10-CM | POA: Diagnosis not present

## 2023-02-12 DIAGNOSIS — N189 Chronic kidney disease, unspecified: Secondary | ICD-10-CM | POA: Diagnosis not present

## 2023-02-12 DIAGNOSIS — I129 Hypertensive chronic kidney disease with stage 1 through stage 4 chronic kidney disease, or unspecified chronic kidney disease: Secondary | ICD-10-CM | POA: Insufficient documentation

## 2023-02-12 DIAGNOSIS — Z7984 Long term (current) use of oral hypoglycemic drugs: Secondary | ICD-10-CM | POA: Diagnosis not present

## 2023-02-12 DIAGNOSIS — Z7901 Long term (current) use of anticoagulants: Secondary | ICD-10-CM | POA: Diagnosis not present

## 2023-02-12 DIAGNOSIS — Z853 Personal history of malignant neoplasm of breast: Secondary | ICD-10-CM | POA: Diagnosis not present

## 2023-02-12 DIAGNOSIS — S59912A Unspecified injury of left forearm, initial encounter: Secondary | ICD-10-CM | POA: Diagnosis present

## 2023-02-12 DIAGNOSIS — S52125A Nondisplaced fracture of head of left radius, initial encounter for closed fracture: Secondary | ICD-10-CM

## 2023-02-12 DIAGNOSIS — R109 Unspecified abdominal pain: Secondary | ICD-10-CM | POA: Diagnosis not present

## 2023-02-12 LAB — I-STAT CHEM 8, ED
BUN: 20 mg/dL (ref 8–23)
Calcium, Ion: 1.17 mmol/L (ref 1.15–1.40)
Chloride: 106 mmol/L (ref 98–111)
Creatinine, Ser: 1.3 mg/dL — ABNORMAL HIGH (ref 0.44–1.00)
Glucose, Bld: 161 mg/dL — ABNORMAL HIGH (ref 70–99)
HCT: 39 % (ref 36.0–46.0)
Hemoglobin: 13.3 g/dL (ref 12.0–15.0)
Potassium: 4.3 mmol/L (ref 3.5–5.1)
Sodium: 137 mmol/L (ref 135–145)
TCO2: 19 mmol/L — ABNORMAL LOW (ref 22–32)

## 2023-02-12 LAB — URINALYSIS, ROUTINE W REFLEX MICROSCOPIC
Bacteria, UA: NONE SEEN
Bilirubin Urine: NEGATIVE
Glucose, UA: >=500 mg/dL — AB
Hgb urine dipstick: NEGATIVE
Ketones, ur: NEGATIVE mg/dL
Nitrite: NEGATIVE
Protein, ur: NEGATIVE mg/dL
Specific Gravity, Urine: 1.014 (ref 1.005–1.030)
pH: 6 (ref 5.0–8.0)

## 2023-02-12 LAB — CBC
HCT: 37 % (ref 36.0–46.0)
Hemoglobin: 11.8 g/dL — ABNORMAL LOW (ref 12.0–15.0)
MCH: 30.3 pg (ref 26.0–34.0)
MCHC: 31.9 g/dL (ref 30.0–36.0)
MCV: 95.1 fL (ref 80.0–100.0)
Platelets: 241 10*3/uL (ref 150–400)
RBC: 3.89 MIL/uL (ref 3.87–5.11)
RDW: 14.8 % (ref 11.5–15.5)
WBC: 7.7 10*3/uL (ref 4.0–10.5)
nRBC: 0 % (ref 0.0–0.2)

## 2023-02-12 LAB — COMPREHENSIVE METABOLIC PANEL
ALT: 19 U/L (ref 0–44)
AST: 23 U/L (ref 15–41)
Albumin: 3 g/dL — ABNORMAL LOW (ref 3.5–5.0)
Alkaline Phosphatase: 60 U/L (ref 38–126)
Anion gap: 12 (ref 5–15)
BUN: 19 mg/dL (ref 8–23)
CO2: 19 mmol/L — ABNORMAL LOW (ref 22–32)
Calcium: 8.3 mg/dL — ABNORMAL LOW (ref 8.9–10.3)
Chloride: 107 mmol/L (ref 98–111)
Creatinine, Ser: 1.26 mg/dL — ABNORMAL HIGH (ref 0.44–1.00)
GFR, Estimated: 43 mL/min — ABNORMAL LOW (ref >60)
Glucose, Bld: 158 mg/dL — ABNORMAL HIGH (ref 70–99)
Potassium: 4.1 mmol/L (ref 3.5–5.1)
Sodium: 138 mmol/L (ref 135–145)
Total Bilirubin: 0.5 mg/dL (ref 0.3–1.2)
Total Protein: 5.3 g/dL — ABNORMAL LOW (ref 6.5–8.1)

## 2023-02-12 LAB — PROTIME-INR
INR: 1.2 (ref 0.8–1.2)
Prothrombin Time: 15.6 seconds — ABNORMAL HIGH (ref 11.4–15.2)

## 2023-02-12 LAB — I-STAT CG4 LACTIC ACID, ED: Lactic Acid, Venous: 1.5 mmol/L (ref 0.5–1.9)

## 2023-02-12 LAB — ETHANOL: Alcohol, Ethyl (B): <10 mg/dL (ref <10)

## 2023-02-12 LAB — MAGNESIUM: Magnesium: 1.6 mg/dL — ABNORMAL LOW (ref 1.7–2.4)

## 2023-02-12 MED ORDER — LACTATED RINGERS IV BOLUS
500.0000 mL | Freq: Once | INTRAVENOUS | Status: AC
  Administered 2023-02-12: 500 mL via INTRAVENOUS

## 2023-02-12 MED ORDER — MAGNESIUM SULFATE 2 GM/50ML IV SOLN
2.0000 g | Freq: Once | INTRAVENOUS | Status: AC
  Administered 2023-02-12: 2 g via INTRAVENOUS
  Filled 2023-02-12: qty 50

## 2023-02-12 MED ORDER — HYDROCODONE-ACETAMINOPHEN 5-325 MG PO TABS: 2.0000 | ORAL_TABLET | Freq: Four times a day (QID) | ORAL | 0 refills | Status: AC | PRN

## 2023-02-12 MED ORDER — ACETAMINOPHEN 325 MG PO TABS
650.0000 mg | ORAL_TABLET | Freq: Once | ORAL | Status: AC
  Administered 2023-02-12: 650 mg via ORAL
  Filled 2023-02-12: qty 2

## 2023-02-12 MED ORDER — LACTATED RINGERS IV BOLUS
250.0000 mL | Freq: Once | INTRAVENOUS | Status: AC
  Administered 2023-02-12: 250 mL via INTRAVENOUS

## 2023-02-12 MED ORDER — FENTANYL CITRATE PF 50 MCG/ML IJ SOSY
50.0000 ug | PREFILLED_SYRINGE | Freq: Once | INTRAMUSCULAR | Status: AC
  Administered 2023-02-12: 50 ug via INTRAVENOUS
  Filled 2023-02-12: qty 1

## 2023-02-12 MED ORDER — ONDANSETRON 4 MG PO TBDP: 4.0000 mg | ORAL_TABLET | Freq: Three times a day (TID) | ORAL | 0 refills | Status: AC | PRN

## 2023-02-12 MED ORDER — ONDANSETRON HCL 4 MG/2ML IJ SOLN
4.0000 mg | Freq: Once | INTRAMUSCULAR | Status: AC
  Administered 2023-02-12: 4 mg via INTRAVENOUS
  Filled 2023-02-12: qty 2

## 2023-02-12 NOTE — ED Triage Notes (Signed)
Pt arrives via ems from parking lot store. Pt states she tripped on a curb, fell on your elbows, hit front of her face, rolled over and hit the back of her head. Pt arrives c/o left hand pain, bilateral elbow pain. No head or face pain. Abrasion to the nose, right elbow, and right knee. Pt is on eliquis for afib. VSS on arrival.

## 2023-02-12 NOTE — ED Notes (Signed)
Reached out to Ortho tech for Splint

## 2023-02-12 NOTE — Progress Notes (Signed)
Orthopedic Tech Progress Note Patient Details:  Kathryn Wall [birth year] [id number]  Level II trauma, not needed at this moment.  Patient ID: [name], female   DOB: 15-Mar-1946, 77 y.o.   MRN: [id number]  Docia Furl 02/12/2023, 7:18 PM

## 2023-02-12 NOTE — Progress Notes (Signed)
   02/12/23 1840  Spiritual Encounters  Type of Visit Initial  Care provided to: Patient  Conversation partners present during encounter Nurse  Referral source Trauma page  Reason for visit Trauma  OnCall Visit Yes  Advance Directives (For Healthcare)  Does Patient Have a Medical Advance Directive? Yes   Chaplain responded to a trauma in the ED - level II fall. Chaplain helped patient retrieve phone from her purse to call family. No other needs at this time. Chaplain introduced spiritual care services. Spiritual care services available as needed.   Alda Ponder, Chaplain 02/12/23

## 2023-02-12 NOTE — Discharge Instructions (Addendum)
You have fractures in your left elbow and left thumb.  Dr. Susa Simmonds would like to see you in his office this week.  Call the telephone number below to set up that appointment.  Take Tylenol as needed for pain and soreness.  A prescription for narcotic pain medication was sent to your pharmacy.  Take only as needed.  Elevate your left hand when possible.  You can hold your Eliquis for a day to minimize swelling and bruising to your left thumb.  Return to the emergency department for any new or worsening symptoms of concern.

## 2023-02-12 NOTE — Progress Notes (Signed)
Orthopedic Tech Progress Note Patient Details:  Kathryn Wall [birth year] [id number]  Ortho Devices Type of Ortho Device: Sugartong splint, Thumb spica splint Splint Material: Fiberglass Ortho Device/Splint Location: lue Ortho Device/Splint Interventions: Ordered, Application, Adjustment  I applied the sugartong first then the thumb spica on top of that. The patient did well and was able to hold up their arm. Post Interventions Patient Tolerated: Well Instructions Provided: Care of device, Adjustment of device  Trinna Post 02/12/2023, 10:44 PM

## 2023-02-12 NOTE — ED Provider Notes (Signed)
[facility] Provider Note   CSN: [id number] Arrival date & time: 02/12/23  1803     History  Chief Complaint  Patient presents with   Bari Mantis is a 77 y.o. female.  HPI Patient presents for fall.  Medical history includes HTN, HLD, T2DM, CKD, breast cancer, GERD, atrial fibrillation.  She is on Eliquis.  Last dose was this morning.  Patient was in her normal state of health earlier today.  While leaving the [employer] store, she tripped and had a mechanical fall.  She fell forward.  She struck the bridge of her nose, left hand, both elbows.  She reportedly had a roll and struck the back of her head as well.  She has been alert and oriented with EMS.  Vital signs been normal.  She has skin tear to the right elbow.  She endorses pain and swelling to the area of left elbow.  When EMS attempted to stand her up, she became near syncopal.  Patient denies any lower extremity pain.    Home Medications Prior to Admission medications   Medication Sig Start Date End Date Taking? Authorizing Provider  HYDROcodone-acetaminophen (NORCO/VICODIN) 5-325 MG tablet Take 2 tablets by mouth every 6 (six) hours as needed for up to 3 days. 02/12/23 02/15/23 Yes Gloris Manchester, MD  ondansetron (ZOFRAN-ODT) 4 MG disintegrating tablet Take 1 tablet (4 mg total) by mouth every 8 (eight) hours as needed for nausea or vomiting. 02/12/23  Yes Gloris Manchester, MD  acetaminophen (TYLENOL) 500 MG tablet Take 1,000 mg by mouth daily as needed for moderate pain, fever or headache.    [provider]  amLODipine (NORVASC) 5 MG tablet TAKE 1 TABLET BY MOUTH ONCE A DAY Patient taking differently: Take 5 mg by mouth daily. 04/23/22   Donita Brooks, MD  atorvastatin (LIPITOR) 40 MG tablet TAKE ONE TABLET BY MOUTH ONCE A DAY 11/02/22   Donita Brooks, MD  ELIQUIS 5 MG TABS tablet TAKE 1 TABLET BY MOUTH TWICE A DAY Patient taking differently: Take 5 mg by mouth 2 (two)  times daily. 04/23/22   Donita Brooks, MD  JARDIANCE 25 MG TABS tablet TAKE 1 TABLET BY MOUTH ONCE A DAY Patient taking differently: Take 25 mg by mouth daily. 04/23/22   Donita Brooks, MD  losartan (COZAAR) 50 MG tablet TAKE ONE TABLET BY MOUTH ONCE A DAY 11/02/22   Donita Brooks, MD  metFORMIN (GLUCOPHAGE-XR) 500 MG 24 hr tablet TAKE 4 TABLETS BY MOUTH ONCE DAILY WITH BREAKFAST. Patient taking differently: Take 2,000 mg by mouth daily with breakfast. 04/23/22   Donita Brooks, MD  metoprolol succinate (TOPROL-XL) 25 MG 24 hr tablet TAKE ONE TABLET BY MOUTH DAILY 12/13/22   Donita Brooks, MD  Multiple Vitamins-Minerals (ONE A DAY WOMEN 50 PLUS) CHEW Chew 1 each by mouth daily.    [provider]  MYRBETRIQ 25 MG TB24 tablet TAKE ONE TABLET BY MOUTH DAILY 09/13/22   Donita Brooks, MD  ondansetron (ZOFRAN-ODT) 4 MG disintegrating tablet Take 4 mg by mouth every 8 (eight) hours as needed for nausea or vomiting.    [provider]  pantoprazole (PROTONIX) 40 MG tablet TAKE ONE TABLET BY MOUTH EVERY MORNING Patient taking differently: Take 40 mg by mouth daily. 11/09/22   Donita Brooks, MD  QUEtiapine (SEROQUEL) 50 MG tablet TAKE ONE TABLET BY MOUTH EVERY NIGHT AT BEDTIME 09/13/22  Donita Brooks, MD  venlafaxine XR (EFFEXOR-XR) 75 MG 24 hr capsule TAKE ONE CAPSULE BY MOUTH ONCE DAILY WITH BREAKFAST Patient taking differently: Take 75 mg by mouth daily with breakfast. 11/09/22   Donita Brooks, MD      Allergies    Ambien [zolpidem tartrate], Cipro [ciprofloxacin hcl], Cymbalta [duloxetine hcl], Other, Cardura [doxazosin], and Percocet [oxycodone-acetaminophen]    Review of Systems   Review of Systems  Musculoskeletal:  Positive for arthralgias.  Skin:  Positive for wound.  Neurological:  Positive for dizziness and light-headedness.  All other systems reviewed and are negative.   Physical Exam Updated Vital Signs BP (!) 156/80   Pulse (!) 57    Temp 98.1 F (36.7 C) (Oral)   Resp 16   Ht 5\' 5"  (1.651 m)   Wt 64.4 kg   SpO2 99%   BMI 23.63 kg/m  Physical Exam Vitals and nursing note reviewed.  Constitutional:      General: She is not in acute distress.    Appearance: Normal appearance. She is well-developed. She is not ill-appearing, toxic-appearing or diaphoretic.  HENT:     Head: Normocephalic and atraumatic.     Right Ear: External ear normal.     Left Ear: External ear normal.     Nose:     Comments: Abrasion to bridge of nose    Mouth/Throat:     Mouth: Mucous membranes are moist.  Eyes:     Extraocular Movements: Extraocular movements intact.     Conjunctiva/sclera: Conjunctivae normal.  Cardiovascular:     Rate and Rhythm: Normal rate. Rhythm irregular.  Pulmonary:     Effort: Pulmonary effort is normal. No respiratory distress.     Breath sounds: No wheezing or rales.  Chest:     Chest wall: No tenderness.  Abdominal:     General: There is no distension.     Palpations: Abdomen is soft.     Tenderness: There is abdominal tenderness.  Musculoskeletal:        General: Swelling and tenderness present.     Cervical back: Normal range of motion and neck supple.     Right lower leg: No edema.     Left lower leg: No edema.  Skin:    General: Skin is warm and dry.     Capillary Refill: Capillary refill takes less than 2 seconds.     Coloration: Skin is not jaundiced or pale.  Neurological:     General: No focal deficit present.     Mental Status: She is alert and oriented to person, place, and time.     Cranial Nerves: No cranial nerve deficit.     Sensory: No sensory deficit.     Motor: No weakness.     Coordination: Coordination normal.  Psychiatric:        Mood and Affect: Mood normal.        Behavior: Behavior normal.     ED Results / Procedures / Treatments   Labs (all labs ordered are listed, but only abnormal results are displayed) Labs Reviewed  COMPREHENSIVE METABOLIC PANEL - Abnormal;  Notable for the following components:      Result Value   CO2 19 (*)    Glucose, Bld 158 (*)    Creatinine, Ser 1.26 (*)    Calcium 8.3 (*)    Total Protein 5.3 (*)    Albumin 3.0 (*)    GFR, Estimated 43 (*)    All other components within normal  limits  CBC - Abnormal; Notable for the following components:   Hemoglobin 11.8 (*)    All other components within normal limits  URINALYSIS, ROUTINE W REFLEX MICROSCOPIC - Abnormal; Notable for the following components:   Glucose, UA >=500 (*)    Leukocytes,Ua TRACE (*)    All other components within normal limits  PROTIME-INR - Abnormal; Notable for the following components:   Prothrombin Time 15.6 (*)    All other components within normal limits  MAGNESIUM - Abnormal; Notable for the following components:   Magnesium 1.6 (*)    All other components within normal limits  I-STAT CHEM 8, ED - Abnormal; Notable for the following components:   Creatinine, Ser 1.30 (*)    Glucose, Bld 161 (*)    TCO2 19 (*)    All other components within normal limits  ETHANOL  I-STAT CG4 LACTIC ACID, ED    EKG EKG Interpretation Date/Time:  Saturday February 12 2023 20:21:51 EDT Ventricular Rate:  57 PR Interval:  176 QRS Duration:  111 QT Interval:  456 QTC Calculation: 444 R Axis:   83  Text Interpretation: Sinus rhythm Borderline low voltage, extremity leads Consider RVH or posterior infarct Confirmed by Gloris Manchester ([id number]) on 02/12/2023 8:37:41 PM  Radiology CT CHEST ABDOMEN PELVIS WO CONTRAST  Result Date: 02/12/2023 CLINICAL DATA:  Fall, pain EXAM: CT CHEST, ABDOMEN AND PELVIS WITHOUT CONTRAST TECHNIQUE: Multidetector CT imaging of the chest, abdomen and pelvis was performed following the standard protocol without IV contrast. Multidetector CT imaging of the thoracic and lumbar spine was performed following the standard protocol without IV contrast. RADIATION DOSE REDUCTION: This exam was performed according to the departmental dose-optimization  program which includes automated exposure control, adjustment of the mA and/or kV according to patient size and/or use of iterative reconstruction technique. COMPARISON:  CT chest angiogram, 12/27/2022, CT abdomen pelvis, 01/26/2022 FINDINGS: CT CHEST FINDINGS Cardiovascular: Aortic atherosclerosis. Normal heart size. No pericardial effusion. Mediastinum/Nodes: No enlarged mediastinal, hilar, or axillary lymph nodes. Thyroid gland, trachea, and esophagus demonstrate no significant findings. Lungs/Pleura: Mild bibasilar scarring or atelectasis. No pleural effusion or pneumothorax. Musculoskeletal: No chest wall mass or suspicious osseous lesions identified. CT ABDOMEN PELVIS FINDINGS Hepatobiliary: Unchanged hypodense subcapsular lesion of the peripheral inferior right lobe of the liver, previously characterized as a hemangioma (series 3, image 56). Status post cholecystectomy. No biliary dilatation. Pancreas: Unremarkable. No pancreatic ductal dilatation or surrounding inflammatory changes. Spleen: Normal in size without significant abnormality. Adrenals/Urinary Tract: Adrenal glands are unremarkable. Lobulated bilateral renal cortical scarring. No calculi or hydronephrosis. Bladder is unremarkable. Stomach/Bowel: Stomach is within normal limits. Appendix not clearly visualized. No evidence of bowel wall thickening, distention, or inflammatory changes. Descending and sigmoid diverticulosis. Moderate burden of stool and stool balls throughout the colon and rectum. Vascular/Lymphatic: Aortic atherosclerosis. No enlarged abdominal or pelvic lymph nodes. Reproductive: Status post hysterectomy. Other: No abdominal wall hernia or abnormality. No ascites. Musculoskeletal: No acute osseous findings. CT THORACIC AND LUMBAR SPINE FINDINGS Alignment: Normal thoracic kyphosis. Normal lumbar lordosis. Vertebral bodies: No acute fracture. Unchanged superior endplate Schmorl deformity of T9. Right-sided pars defect of L5. Disc  spaces: Generally mild disc space height loss and osteophytosis throughout the thoracic spine. Severe multilevel lumbar disc degenerative throughout the lumbar spine. Paraspinous soft tissues: Unremarkable. IMPRESSION: 1. No acute noncontrast CT findings of the chest, abdomen, or pelvis. 2. No acute fracture or dislocation of the thoracic or lumbar spine. 3. Status post hysterectomy and cholecystectomy. 4. Descending and sigmoid diverticulosis  without evidence of acute diverticulitis. Aortic Atherosclerosis (ICD10-I70.0). Electronically Signed   By: Jearld Lesch M.D.   On: 02/12/2023 20:37   CT T-SPINE NO CHARGE  Result Date: 02/12/2023 CLINICAL DATA:  Fall, pain EXAM: CT CHEST, ABDOMEN AND PELVIS WITHOUT CONTRAST TECHNIQUE: Multidetector CT imaging of the chest, abdomen and pelvis was performed following the standard protocol without IV contrast. Multidetector CT imaging of the thoracic and lumbar spine was performed following the standard protocol without IV contrast. RADIATION DOSE REDUCTION: This exam was performed according to the departmental dose-optimization program which includes automated exposure control, adjustment of the mA and/or kV according to patient size and/or use of iterative reconstruction technique. COMPARISON:  CT chest angiogram, 12/27/2022, CT abdomen pelvis, 01/26/2022 FINDINGS: CT CHEST FINDINGS Cardiovascular: Aortic atherosclerosis. Normal heart size. No pericardial effusion. Mediastinum/Nodes: No enlarged mediastinal, hilar, or axillary lymph nodes. Thyroid gland, trachea, and esophagus demonstrate no significant findings. Lungs/Pleura: Mild bibasilar scarring or atelectasis. No pleural effusion or pneumothorax. Musculoskeletal: No chest wall mass or suspicious osseous lesions identified. CT ABDOMEN PELVIS FINDINGS Hepatobiliary: Unchanged hypodense subcapsular lesion of the peripheral inferior right lobe of the liver, previously characterized as a hemangioma (series 3, image 56).  Status post cholecystectomy. No biliary dilatation. Pancreas: Unremarkable. No pancreatic ductal dilatation or surrounding inflammatory changes. Spleen: Normal in size without significant abnormality. Adrenals/Urinary Tract: Adrenal glands are unremarkable. Lobulated bilateral renal cortical scarring. No calculi or hydronephrosis. Bladder is unremarkable. Stomach/Bowel: Stomach is within normal limits. Appendix not clearly visualized. No evidence of bowel wall thickening, distention, or inflammatory changes. Descending and sigmoid diverticulosis. Moderate burden of stool and stool balls throughout the colon and rectum. Vascular/Lymphatic: Aortic atherosclerosis. No enlarged abdominal or pelvic lymph nodes. Reproductive: Status post hysterectomy. Other: No abdominal wall hernia or abnormality. No ascites. Musculoskeletal: No acute osseous findings. CT THORACIC AND LUMBAR SPINE FINDINGS Alignment: Normal thoracic kyphosis. Normal lumbar lordosis. Vertebral bodies: No acute fracture. Unchanged superior endplate Schmorl deformity of T9. Right-sided pars defect of L5. Disc spaces: Generally mild disc space height loss and osteophytosis throughout the thoracic spine. Severe multilevel lumbar disc degenerative throughout the lumbar spine. Paraspinous soft tissues: Unremarkable. IMPRESSION: 1. No acute noncontrast CT findings of the chest, abdomen, or pelvis. 2. No acute fracture or dislocation of the thoracic or lumbar spine. 3. Status post hysterectomy and cholecystectomy. 4. Descending and sigmoid diverticulosis without evidence of acute diverticulitis. Aortic Atherosclerosis (ICD10-I70.0). Electronically Signed   By: Jearld Lesch M.D.   On: 02/12/2023 20:37   CT L-SPINE NO CHARGE  Result Date: 02/12/2023 CLINICAL DATA:  Fall, pain EXAM: CT CHEST, ABDOMEN AND PELVIS WITHOUT CONTRAST TECHNIQUE: Multidetector CT imaging of the chest, abdomen and pelvis was performed following the standard protocol without IV contrast.  Multidetector CT imaging of the thoracic and lumbar spine was performed following the standard protocol without IV contrast. RADIATION DOSE REDUCTION: This exam was performed according to the departmental dose-optimization program which includes automated exposure control, adjustment of the mA and/or kV according to patient size and/or use of iterative reconstruction technique. COMPARISON:  CT chest angiogram, 12/27/2022, CT abdomen pelvis, 01/26/2022 FINDINGS: CT CHEST FINDINGS Cardiovascular: Aortic atherosclerosis. Normal heart size. No pericardial effusion. Mediastinum/Nodes: No enlarged mediastinal, hilar, or axillary lymph nodes. Thyroid gland, trachea, and esophagus demonstrate no significant findings. Lungs/Pleura: Mild bibasilar scarring or atelectasis. No pleural effusion or pneumothorax. Musculoskeletal: No chest wall mass or suspicious osseous lesions identified. CT ABDOMEN PELVIS FINDINGS Hepatobiliary: Unchanged hypodense subcapsular lesion of the peripheral inferior right lobe of the  liver, previously characterized as a hemangioma (series 3, image 56). Status post cholecystectomy. No biliary dilatation. Pancreas: Unremarkable. No pancreatic ductal dilatation or surrounding inflammatory changes. Spleen: Normal in size without significant abnormality. Adrenals/Urinary Tract: Adrenal glands are unremarkable. Lobulated bilateral renal cortical scarring. No calculi or hydronephrosis. Bladder is unremarkable. Stomach/Bowel: Stomach is within normal limits. Appendix not clearly visualized. No evidence of bowel wall thickening, distention, or inflammatory changes. Descending and sigmoid diverticulosis. Moderate burden of stool and stool balls throughout the colon and rectum. Vascular/Lymphatic: Aortic atherosclerosis. No enlarged abdominal or pelvic lymph nodes. Reproductive: Status post hysterectomy. Other: No abdominal wall hernia or abnormality. No ascites. Musculoskeletal: No acute osseous findings. CT  THORACIC AND LUMBAR SPINE FINDINGS Alignment: Normal thoracic kyphosis. Normal lumbar lordosis. Vertebral bodies: No acute fracture. Unchanged superior endplate Schmorl deformity of T9. Right-sided pars defect of L5. Disc spaces: Generally mild disc space height loss and osteophytosis throughout the thoracic spine. Severe multilevel lumbar disc degenerative throughout the lumbar spine. Paraspinous soft tissues: Unremarkable. IMPRESSION: 1. No acute noncontrast CT findings of the chest, abdomen, or pelvis. 2. No acute fracture or dislocation of the thoracic or lumbar spine. 3. Status post hysterectomy and cholecystectomy. 4. Descending and sigmoid diverticulosis without evidence of acute diverticulitis. Aortic Atherosclerosis (ICD10-I70.0). Electronically Signed   By: Jearld Lesch M.D.   On: 02/12/2023 20:37   CT HEAD WO CONTRAST  Result Date: 02/12/2023 CLINICAL DATA:  Trip and fall, facial and head injury EXAM: CT HEAD WITHOUT CONTRAST CT MAXILLOFACIAL WITHOUT CONTRAST CT CERVICAL SPINE WITHOUT CONTRAST TECHNIQUE: Multidetector CT imaging of the head, cervical spine, and maxillofacial structures were performed using the standard protocol without intravenous contrast. Multiplanar CT image reconstructions of the cervical spine and maxillofacial structures were also generated. RADIATION DOSE REDUCTION: This exam was performed according to the departmental dose-optimization program which includes automated exposure control, adjustment of the mA and/or kV according to patient size and/or use of iterative reconstruction technique. COMPARISON:  03/23/2022 FINDINGS: Examination is somewhat limited by motion throughout CT HEAD FINDINGS Brain: No evidence of acute infarction, hemorrhage, hydrocephalus, extra-axial collection or mass lesion/mass effect. Mild periventricular white matter hypodensity. Vascular: No hyperdense vessel or unexpected calcification. CT FACIAL BONES FINDINGS Skull: Normal. Negative for fracture  or focal lesion. Facial bones: No displaced fractures or dislocations. Sinuses/Orbits: No acute finding. Other: None. CT CERVICAL SPINE FINDINGS Alignment: Normal. Skull base and vertebrae: No acute fracture. No primary bone lesion or focal pathologic process. Soft tissues and spinal canal: No prevertebral fluid or swelling. No visible canal hematoma. Disc levels: Severe disc space height loss and osteophytosis from C4-C7. Upper chest: Negative. Other: None. IMPRESSION: 1. Examination is somewhat limited by motion throughout. 2. Within this limitation, no acute intracranial pathology. Small-vessel white matter disease. 3. No displaced fractures or dislocations of the facial bones. 4. No fracture or static subluxation of the cervical spine. 5. 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 Electronically Signed   By: Jearld Lesch M.D.   On: 02/12/2023 20:16   CT MAXILLOFACIAL WO CONTRAST  Result Date: 02/12/2023 CLINICAL DATA:  Trip and fall, facial and head injury EXAM: CT HEAD WITHOUT CONTRAST CT MAXILLOFACIAL WITHOUT CONTRAST CT CERVICAL SPINE WITHOUT CONTRAST TECHNIQUE: Multidetector CT imaging of the head, cervical spine, and maxillofacial structures were performed using the standard protocol without intravenous contrast. Multiplanar CT image reconstructions of the cervical spine and maxillofacial structures were also generated. RADIATION DOSE REDUCTION: This exam was performed according to the departmental dose-optimization program which includes automated exposure  control, adjustment of the mA and/or kV according to patient size and/or use of iterative reconstruction technique. COMPARISON:  03/23/2022 FINDINGS: Examination is somewhat limited by motion throughout CT HEAD FINDINGS Brain: No evidence of acute infarction, hemorrhage, hydrocephalus, extra-axial collection or mass lesion/mass effect. Mild periventricular white matter hypodensity. Vascular: No hyperdense vessel or unexpected  calcification. CT FACIAL BONES FINDINGS Skull: Normal. Negative for fracture or focal lesion. Facial bones: No displaced fractures or dislocations. Sinuses/Orbits: No acute finding. Other: None. CT CERVICAL SPINE FINDINGS Alignment: Normal. Skull base and vertebrae: No acute fracture. No primary bone lesion or focal pathologic process. Soft tissues and spinal canal: No prevertebral fluid or swelling. No visible canal hematoma. Disc levels: Severe disc space height loss and osteophytosis from C4-C7. Upper chest: Negative. Other: None. IMPRESSION: 1. Examination is somewhat limited by motion throughout. 2. Within this limitation, no acute intracranial pathology. Small-vessel white matter disease. 3. No displaced fractures or dislocations of the facial bones. 4. No fracture or static subluxation of the cervical spine. 5. 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 Electronically Signed   By: Jearld Lesch M.D.   On: 02/12/2023 20:16   CT CERVICAL SPINE WO CONTRAST  Result Date: 02/12/2023 CLINICAL DATA:  Trip and fall, facial and head injury EXAM: CT HEAD WITHOUT CONTRAST CT MAXILLOFACIAL WITHOUT CONTRAST CT CERVICAL SPINE WITHOUT CONTRAST TECHNIQUE: Multidetector CT imaging of the head, cervical spine, and maxillofacial structures were performed using the standard protocol without intravenous contrast. Multiplanar CT image reconstructions of the cervical spine and maxillofacial structures were also generated. RADIATION DOSE REDUCTION: This exam was performed according to the departmental dose-optimization program which includes automated exposure control, adjustment of the mA and/or kV according to patient size and/or use of iterative reconstruction technique. COMPARISON:  03/23/2022 FINDINGS: Examination is somewhat limited by motion throughout CT HEAD FINDINGS Brain: No evidence of acute infarction, hemorrhage, hydrocephalus, extra-axial collection or mass lesion/mass effect. Mild periventricular white  matter hypodensity. Vascular: No hyperdense vessel or unexpected calcification. CT FACIAL BONES FINDINGS Skull: Normal. Negative for fracture or focal lesion. Facial bones: No displaced fractures or dislocations. Sinuses/Orbits: No acute finding. Other: None. CT CERVICAL SPINE FINDINGS Alignment: Normal. Skull base and vertebrae: No acute fracture. No primary bone lesion or focal pathologic process. Soft tissues and spinal canal: No prevertebral fluid or swelling. No visible canal hematoma. Disc levels: Severe disc space height loss and osteophytosis from C4-C7. Upper chest: Negative. Other: None. IMPRESSION: 1. Examination is somewhat limited by motion throughout. 2. Within this limitation, no acute intracranial pathology. Small-vessel white matter disease. 3. No displaced fractures or dislocations of the facial bones. 4. No fracture or static subluxation of the cervical spine. 5. 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 Electronically Signed   By: Jearld Lesch M.D.   On: 02/12/2023 20:16   DG Hand Complete Left  Result Date: 02/12/2023 CLINICAL DATA:  Trauma, hand pain EXAM: LEFT HAND - COMPLETE 3+ VIEW COMPARISON:  None Available. FINDINGS: Nondisplaced fracture of the ulnar corner of the base of the first proximal phalanx with displacement involving the articular surface. Oblique nondisplaced fracture of the first distal phalanx. No other fracture or dislocation. No aggressive osseous lesion. Normal alignment. Generalized osteopenia. Mild osteoarthritis of the first CMC joint. Soft tissue are unremarkable. No radiopaque foreign body or soft tissue emphysema. IMPRESSION: 1. Acute nondisplaced fracture of the ulnar corner of the base of the first proximal phalanx with displacement involving the articular surface. 2. Acute oblique nondisplaced fracture of the first  distal phalanx. Electronically Signed   By: Elige Ko M.D.   On: 02/12/2023 18:52   DG Elbow Complete Left  Result Date:  02/12/2023 CLINICAL DATA:  Blunt Trauma EXAM: LEFT ELBOW - COMPLETE 3+ VIEW COMPARISON:  None Available. FINDINGS: Acute fracture of the left radial head along the peripheral volar aspect involving the articular surface. No other acute fracture or dislocation. No aggressive osseous lesion. Normal alignment. Soft tissue are unremarkable. No radiopaque foreign body or soft tissue emphysema. IMPRESSION: 1. Acute fracture of the left radial head along the peripheral volar aspect involving the articular surface. Electronically Signed   By: Elige Ko M.D.   On: 02/12/2023 18:50   DG Chest Port 1 View  Result Date: 02/12/2023 CLINICAL DATA:  Trauma EXAM: PORTABLE CHEST 1 VIEW COMPARISON:  02/04/2021 FINDINGS: No focal consolidation. No pleural effusion or pneumothorax. Heart and mediastinal contours are unremarkable. No acute osseous abnormality. IMPRESSION: No active disease. Electronically Signed   By: Elige Ko M.D.   On: 02/12/2023 18:49   DG Pelvis Portable  Result Date: 02/12/2023 CLINICAL DATA:  Trauma EXAM: PORTABLE PELVIS 1-2 VIEWS COMPARISON:  None Available. FINDINGS: There is no evidence of pelvic fracture or diastasis. No pelvic bone lesions are seen. IMPRESSION: Negative. Electronically Signed   By: Elige Ko M.D.   On: 02/12/2023 18:48    Procedures Procedures    Medications Ordered in ED Medications  magnesium sulfate IVPB 2 g 50 mL (2 g Intravenous New Bag/Given 02/12/23 2038)  lactated ringers bolus 500 mL (has no administration in time range)  fentaNYL (SUBLIMAZE) injection 50 mcg (has no administration in time range)  ondansetron (ZOFRAN) injection 4 mg (has no administration in time range)  lactated ringers bolus 250 mL (0 mLs Intravenous Stopped 02/12/23 1939)  acetaminophen (TYLENOL) tablet 650 mg (650 mg Oral Given 02/12/23 1829)    ED Course/ Medical Decision Making/ A&P                                 Medical Decision Making Amount and/or Complexity of Data  Reviewed Labs: ordered. Radiology: ordered.  Risk OTC drugs. Prescription drug management.   This patient presents to the ED for concern of fall, this involves an extensive number of treatment options, and is a complaint that carries with it a high risk of complications and morbidity.  The differential diagnosis includes acute injuries   Co morbidities that complicate the patient evaluation  HTN, HLD, T2DM, CKD, breast cancer, GERD, atrial fibrillation   Additional history obtained:  Additional history obtained from EMS External records from outside source obtained and reviewed including Wall   Lab Tests:  I Ordered, and personally interpreted labs.  The pertinent results include: Creatinine mildly increased from baseline, hypomagnesemia with otherwise normal electrolytes, hemoglobin slightly decreased at baseline, no leukocytosis, no evidence of UTI   Imaging Studies ordered:  I ordered imaging studies including x-ray of chest, pelvis, left hand, left elbow; CT imaging of head, cervical spine, face, chest, abdomen, pelvis, T-spine, L-spine I independently visualized and interpreted imaging which showed x-ray imaging showed acute fracture of radial head along peripheral volar aspect; fracture at base and distal phalanx of first digit of left hand.  CT scans showed no acute findings. I agree with the radiologist interpretation   Cardiac Monitoring: / EKG:  The patient was maintained on a cardiac monitor.  I personally viewed and interpreted the cardiac monitored which  showed an underlying rhythm of: Sinus rhythm   Consultations Obtained:  I requested consultation with the orthopedic surgeon, Dr. Susa Simmonds,  and discussed lab and imaging findings as well as pertinent plan - they recommend: Splint and outpatient follow-up   Problem List / ED Course / Critical interventions / Medication management  Patient presents for fall.  On arrival in the ED, she is alert and oriented.  She  has a small abrasion to the bridge of her nose.  She has a small skin tear to her right elbow.  She has pain and swelling to left elbow in addition to left hand.  She denies any other areas of discomfort.  No chest tenderness is present.  She has mild abdominal tenderness.  She denies any lower extremity pain.  Per EMS, she did have a near syncopal episode when they went to stand her up.  Symptoms of near syncope did not precede the fall, they were only after.  Patient reports that her last tetanus shot was within the past year.  Trauma workup was initiated.  Tylenol was ordered for analgesia.  Gentle IV fluids were ordered.  On x-ray imaging, patient was found to have radial head fracture in addition to fractures of left first digit.  I discussed this with her orthopedic surgeon, Dr. Susa Simmonds, who recommends splint and outpatient follow-up.  Long-arm splint and sling were ordered.  Lab work is notable for mild increase in creatinine from baseline.  Additional IV fluids were ordered.  CT imaging did not show acute findings.  Patient is right elbow skin tear was cleaned and dressed with Xeroform and gauze.  She developed swelling and bruising to the pad of her distal left first digit.  She was advised to elevate this hand at home and to hold her Eliquis for a day.  At time of signout, patient was awaiting splint.  She will require trial of ambulation prior to discharge home.  She does live on her own.  Care of patient was signed out to oncoming ED provider. I ordered medication including IV fluids for hydration; magnesium sulfate for hypomagnesemia; Tylenol and fentanyl for analgesia Reevaluation of the patient after these medicines showed that the patient improved I have reviewed the patients home medicines and have made adjustments as needed   Social Determinants of Health:  Has access to outpatient care        Final Clinical Impression(s) / ED Diagnoses Final diagnoses:  Fall, initial encounter   Closed nondisplaced fracture of head of left radius, initial encounter  Nondisplaced fracture of distal phalanx of left thumb, initial encounter for closed fracture  Nondisplaced fracture of proximal phalanx of left thumb, initial encounter for closed fracture    Rx / DC Orders ED Discharge Orders          Ordered    HYDROcodone-acetaminophen (NORCO/VICODIN) 5-325 MG tablet  Every 6 hours PRN        02/12/23 2104    ondansetron (ZOFRAN-ODT) 4 MG disintegrating tablet  Every 8 hours PRN        02/12/23 2104              Gloris Manchester, MD 02/12/23 2105

## 2023-02-12 NOTE — ED Notes (Signed)
PT ambulate in the hall way with her cane. PT's gait normal, she walked without difficulty. PT stated her family member can pick her up from the hospital.

## 2023-02-12 NOTE — ED Provider Notes (Signed)
Care of patient received from prior provider at 9:20 PM, please see their note for complete H/P and care plan.  Received handoff per ED course.  Clinical Course as of 02/12/23 2120  Sat Feb 12, 2023  2120 Stable GLF Walk  splint [CC]    Clinical Course User Index [CC] Glyn Ade, MD    Reassessment: No acute distress.   Disposition:  I have considered need for hospitalization, however, considering all of the above, I believe this patient is stable for discharge at this time.  Patient/family educated about specific return precautions for given chief complaint and symptoms.  Patient/family educated about follow-up with PCP.     Patient/family expressed understanding of return precautions and need for follow-up. Patient spoken to regarding all imaging and laboratory results and appropriate follow up for these results. All education provided in verbal form with additional information in written form. Time was allowed for answering of patient questions. Patient discharged.    Emergency Department Medication Summary:   Medications  lactated ringers bolus 250 mL (0 mLs Intravenous Stopped 02/12/23 1939)  acetaminophen (TYLENOL) tablet 650 mg (650 mg Oral Given 02/12/23 1829)  magnesium sulfate IVPB 2 g 50 mL (0 g Intravenous Stopped 02/12/23 2148)  lactated ringers bolus 500 mL (0 mLs Intravenous Stopped 02/12/23 2149)  fentaNYL (SUBLIMAZE) injection 50 mcg (50 mcg Intravenous Given 02/12/23 2111)  ondansetron (ZOFRAN) injection 4 mg (4 mg Intravenous Given 02/12/23 2108)            Glyn Ade, MD 02/12/23 2308

## 2023-02-14 ENCOUNTER — Telehealth: Payer: Self-pay

## 2023-02-14 NOTE — Transitions of Care (Post Inpatient/ED Visit) (Unsigned)
   02/14/2023  Name: Kathryn Wall MRN: [id number] DOB: 16-Aug-1945  Today's TOC FU Call Status: Today's TOC FU Call Status:: Unsuccessful Call (1st Attempt) Unsuccessful Call (1st Attempt) Date: 02/14/23  Attempted to reach the patient regarding the most recent Inpatient/ED visit.  Follow Up Plan: Additional outreach attempts will be made to reach the patient to complete the Transitions of Care (Post Inpatient/ED visit) call.   Signature   Woodfin Ganja LPN [facility] Nurse Health Advisor Direct Dial [phone]

## 2023-02-15 NOTE — Transitions of Care (Post Inpatient/ED Visit) (Signed)
02/15/2023  Name: Kathryn Wall MRN: [id number] DOB: Sep 27, 1945  Today's TOC FU Call Status: Today's TOC FU Call Status:: Successful TOC FU Call Completed Unsuccessful Call (1st Attempt) Date: 02/14/23 [facility] FU Call Complete Date: 02/15/23  Transition Care Management Follow-up Telephone Call Date of Discharge: 02/13/23 Discharge Facility: [facility]) Type of Discharge: Emergency Department Reason for ED Visit: Other: (fall) How have you been since you were released from the hospital?: Better Any questions or concerns?: No  Items Reviewed: Did you receive and understand the discharge instructions provided?: Yes Medications obtained,verified, and reconciled?: Yes (Medications Reviewed) Any new allergies since your discharge?: No Dietary orders reviewed?: Yes Do you have support at home?: Yes  Medications Reviewed Today: Medications Reviewed Today     Reviewed by Merleen Nicely, LPN (Licensed Practical Nurse) on 02/15/23 at 1115  Med List Status: <None>   Medication Order Taking? Sig Documenting Provider Last Dose Status Informant  acetaminophen (TYLENOL) 500 MG tablet [id number] Yes Take 1,000 mg by mouth daily as needed for moderate pain, fever or headache. [provider] Taking Active Self  amLODipine (NORVASC) 5 MG tablet [id number] Yes TAKE 1 TABLET BY MOUTH ONCE A DAY  Patient taking differently: Take 5 mg by mouth daily.   Donita Brooks, MD Taking Active Self, Pharmacy Records  atorvastatin (LIPITOR) 40 MG tablet [id number] Yes TAKE ONE TABLET BY MOUTH ONCE A DAY Donita Brooks, MD Taking Active Self, Pharmacy Records  ELIQUIS 5 MG TABS tablet [id number] Yes TAKE 1 TABLET BY MOUTH TWICE A DAY  Patient taking differently: Take 5 mg by mouth 2 (two) times daily.   Donita Brooks, MD Taking Active Self, Pharmacy Records  HYDROcodone-acetaminophen (NORCO/VICODIN) 5-325 MG tablet [id number] Yes Take 2 tablets by mouth every 6 (six) hours as needed for up  to 3 days. Gloris Manchester, MD Taking Active   JARDIANCE 25 MG TABS tablet [id number] Yes TAKE 1 TABLET BY MOUTH ONCE A DAY  Patient taking differently: Take 25 mg by mouth daily.   Donita Brooks, MD Taking Active Self, Pharmacy Records  losartan (COZAAR) 50 MG tablet [id number] Yes TAKE ONE TABLET BY MOUTH ONCE A DAY Donita Brooks, MD Taking Active Self, Pharmacy Records  metFORMIN (GLUCOPHAGE-XR) 500 MG 24 hr tablet [id number] Yes TAKE 4 TABLETS BY MOUTH ONCE DAILY WITH BREAKFAST.  Patient taking differently: Take 2,000 mg by mouth daily with breakfast.   Donita Brooks, MD Taking Active Self, Pharmacy Records  metoprolol succinate (TOPROL-XL) 25 MG 24 hr tablet [id number] Yes TAKE ONE TABLET BY MOUTH DAILY Donita Brooks, MD Taking Active Self, Pharmacy Records  Multiple Vitamins-Minerals (ONE A DAY WOMEN 50 PLUS) CHEW [id number] Yes Chew 1 each by mouth daily. [provider] Taking Active Self  MYRBETRIQ 25 MG TB24 tablet [id number] Yes TAKE ONE TABLET BY MOUTH DAILY Tanya Nones Priscille Heidelberg, MD Taking Active Self, Pharmacy Records  ondansetron (ZOFRAN-ODT) 4 MG disintegrating tablet [id number] Yes Take 4 mg by mouth every 8 (eight) hours as needed for nausea or vomiting. [provider] Taking Active Self, Pharmacy Records  ondansetron (ZOFRAN-ODT) 4 MG disintegrating tablet [id number] Yes Take 1 tablet (4 mg total) by mouth every 8 (eight) hours as needed for nausea or vomiting. Gloris Manchester, MD Taking Active   pantoprazole (PROTONIX) 40 MG tablet [id number] Yes TAKE ONE TABLET BY MOUTH EVERY MORNING  Patient taking differently: Take 40 mg by mouth daily.   Donita Brooks, MD Taking Active Self, Pharmacy  Records  QUEtiapine (SEROQUEL) 50 MG tablet [id number] Yes TAKE ONE TABLET BY MOUTH EVERY NIGHT AT BEDTIME Donita Brooks, MD Taking Active Self, Pharmacy Records  venlafaxine XR (EFFEXOR-XR) 75 MG 24 hr capsule [id number] Yes TAKE ONE CAPSULE BY MOUTH ONCE DAILY WITH BREAKFAST   Patient taking differently: Take 75 mg by mouth daily with breakfast.   Donita Brooks, MD Taking Active Self, Pharmacy Records            Home Care and Equipment/Supplies: Were Home Health Services Ordered?: No Any new equipment or medical supplies ordered?: No  Functional Questionnaire: Do you need assistance with bathing/showering or dressing?: Yes Do you need assistance with meal preparation?: Yes Do you need assistance with eating?: No Do you have difficulty maintaining continence: No Do you need assistance with getting out of bed/getting out of a chair/moving?: Yes Do you have difficulty managing or taking your medications?: No  Follow up appointments reviewed: PCP Follow-up appointment confirmed?: No MD Provider Line Number:[phone] Given: Yes Specialist Hospital Follow-up appointment confirmed?: Yes Date of Specialist follow-up appointment?: 02/15/23 Follow-Up Specialty Provider:: orthopaedics Do you need transportation to your follow-up appointment?: No Do you understand care options if your condition(s) worsen?: Yes-patient verbalized understanding    SIGNATURE  Woodfin Ganja LPN [facility] Nurse Health Advisor Direct Dial [phone]

## 2023-03-04 ENCOUNTER — Other Ambulatory Visit: Payer: Self-pay | Admitting: Family Medicine

## 2023-03-11 ENCOUNTER — Telehealth: Payer: Self-pay | Admitting: Family Medicine

## 2023-03-16 NOTE — Telephone Encounter (Signed)
PA FOR MIRABEGRON SENT TO COVER MY MEDS.   Kathryn Wall (Key: [id number])   Your information has been sent to [facility].

## 2023-04-06 ENCOUNTER — Other Ambulatory Visit: Payer: Self-pay | Admitting: Family Medicine

## 2023-04-07 NOTE — Telephone Encounter (Signed)
Last OV 01/28/23 within protocol.  Requested Prescriptions  Pending Prescriptions Disp Refills   losartan (COZAAR) 50 MG tablet [Pharmacy Med Name: LOSARTAN POTASSIUM 50MG  TABLET] 100 tablet 0    Sig: TAKE ONE TABLET BY MOUTH ONCE A DAY     Cardiovascular:  Angiotensin Receptor Blockers Failed - 04/06/2023  4:09 PM      Failed - Cr in normal range and within 180 days    Creat  Date Value Ref Range Status  01/28/2023 1.12 (H) 0.60 - 1.00 mg/dL Final   Creatinine, Ser  Date Value Ref Range Status  02/12/2023 1.30 (H) 0.44 - 1.00 mg/dL Final   Creatinine, Urine  Date Value Ref Range Status  08/05/2022 58 20 - 275 mg/dL Final         Failed - Last BP in normal range    BP Readings from Last 1 Encounters:  02/12/23 (!) 143/79         Failed - Valid encounter within last 6 months    Recent Outpatient Visits           2 years ago Type 2 diabetes mellitus with hyperlipidemia (HCC)   [facility] Donita Brooks, MD   2 years ago Bibasilar crackles   [facility] Donita Brooks, MD   2 years ago Type 2 diabetes mellitus with hyperlipidemia (HCC)   [facility] Donita Brooks, MD   3 years ago Type 2 diabetes mellitus with hyperlipidemia (HCC)   [facility] Pickard, Priscille Heidelberg, MD   4 years ago Carpal tunnel syndrome of right wrist   [facility] Medicine Pickard, Priscille Heidelberg, MD              Passed - K in normal range and within 180 days    Potassium  Date Value Ref Range Status  02/12/2023 4.3 3.5 - 5.1 mmol/L Final         Passed - Patient is not pregnant      '

## 2023-04-15 ENCOUNTER — Other Ambulatory Visit: Payer: Self-pay | Admitting: Family Medicine

## 2023-04-15 NOTE — Telephone Encounter (Signed)
Last OV 01/28/23 Requested Prescriptions  Pending Prescriptions Disp Refills   metFORMIN (GLUCOPHAGE-XR) 500 MG 24 hr tablet [Pharmacy Med Name: METFORMIN HYDROCHLORIDE ER 500MG  ER TABLET ER 24HR] 360 tablet 1    Sig: TAKE FOUR TABLETS BY MOUTH ONCE DAILY WITH BREAKFAST.     Endocrinology:  Diabetes - Biguanides Failed - 04/15/2023  4:32 PM      Failed - Cr in normal range and within 360 days    Creat  Date Value Ref Range Status  01/28/2023 1.12 (H) 0.60 - 1.00 mg/dL Final   Creatinine, Ser  Date Value Ref Range Status  02/12/2023 1.30 (H) 0.44 - 1.00 mg/dL Final   Creatinine, Urine  Date Value Ref Range Status  08/05/2022 58 20 - 275 mg/dL Final         Failed - eGFR in normal range and within 360 days    GFR, Est African American  Date Value Ref Range Status  08/11/2020 56 (L) > OR = 60 mL/min/1.53m2 Final   GFR, Est Non African American  Date Value Ref Range Status  08/11/2020 48 (L) > OR = 60 mL/min/1.39m2 Final   GFR, Estimated  Date Value Ref Range Status  02/12/2023 43 (L) >60 mL/min Final    Comment:    (NOTE) Calculated using the CKD-EPI Creatinine Equation (2021)    eGFR  Date Value Ref Range Status  01/28/2023 50 (L) > OR = 60 mL/min/1.81m2 Final  12/16/2020 50 (L) >59 mL/min/1.73 Final         Failed - Valid encounter within last 6 months    Recent Outpatient Visits           2 years ago Type 2 diabetes mellitus with hyperlipidemia (HCC)   [facility] Donita Brooks, MD   2 years ago Bibasilar crackles   [facility] Donita Brooks, MD   2 years ago Type 2 diabetes mellitus with hyperlipidemia (HCC)   [facility] Pickard, Priscille Heidelberg, MD   3 years ago Type 2 diabetes mellitus with hyperlipidemia (HCC)   [facility] Pickard, Priscille Heidelberg, MD   4 years ago Carpal tunnel syndrome of right wrist   [facility] Pickard, Priscille Heidelberg, MD              Passed - HBA1C is  between 0 and 7.9 and within 180 days    Hgb A1C (fingerstick)  Date Value Ref Range Status  06/04/2016 7.1 (H) <5.7 % Final    Comment:                                                                           According to the ADA Clinical Practice Recommendations for 2011, when HbA1c is used as a screening test:     >=6.5%   Diagnostic of Diabetes Mellitus            (if abnormal result is confirmed)   5.7-6.4%   Increased risk of developing Diabetes Mellitus   References:Diagnosis and Classification of Diabetes Mellitus,Diabetes Care,2011,34(Suppl 1):S62-S69 and Standards of Medical Care in         Diabetes - 2011,Diabetes Care,2011,34 (Suppl 1):S11-S61.  Hgb A1c MFr Bld  Date Value Ref Range Status  01/28/2023 5.9 (H) <5.7 % of total Hgb Final    Comment:    For someone without known diabetes, a hemoglobin  A1c value between 5.7% and 6.4% is consistent with prediabetes and should be confirmed with a  follow-up test. . For someone with known diabetes, a value <7% indicates that their diabetes is well controlled. A1c targets should be individualized based on duration of diabetes, age, comorbid conditions, and other considerations. . This assay result is consistent with an increased risk of diabetes. . Currently, no consensus exists regarding use of hemoglobin A1c for diagnosis of diabetes for children. .          Passed - B12 Level in normal range and within 720 days    Vitamin B-12  Date Value Ref Range Status  03/25/2022 206 180 - 914 pg/mL Final    Comment:    (NOTE) This assay is not validated for testing neonatal or myeloproliferative syndrome specimens for Vitamin B12 levels. Performed at [facility], [address] [place], [place] [address]          Passed - CBC within normal limits and completed in the last 12 months    WBC  Date Value Ref Range Status  02/12/2023 7.7 4.0 - 10.5 K/uL Final   RBC  Date Value Ref Range Status   02/12/2023 3.89 3.87 - 5.11 MIL/uL Final   Hemoglobin  Date Value Ref Range Status  02/12/2023 13.3 12.0 - 15.0 g/dL Final  12/26/1599 09.3 11.1 - 15.9 g/dL Final   HCT  Date Value Ref Range Status  02/12/2023 39.0 36.0 - 46.0 % Final   Hematocrit  Date Value Ref Range Status  12/16/2020 36.5 34.0 - 46.6 % Final   MCHC  Date Value Ref Range Status  02/12/2023 31.9 30.0 - 36.0 g/dL Final   [facility]  Date Value Ref Range Status  02/12/2023 30.3 26.0 - 34.0 pg Final   MCV  Date Value Ref Range Status  02/12/2023 95.1 80.0 - 100.0 fL Final  12/16/2020 91 79 - 97 fL Final   No results found for: "PLTCOUNTKUC", "LABPLAT", "POCPLA" RDW  Date Value Ref Range Status  02/12/2023 14.8 11.5 - 15.5 % Final  12/16/2020 13.7 11.7 - 15.4 % Final          mirabegron ER (MYRBETRIQ) 25 MG TB24 tablet [Pharmacy Med Name: MYRBETRIQ 25MG  TABLET ER 24HR] 90 tablet 1    Sig: TAKE ONE TABLET BY MOUTH ONCE A DAY     Urology: Bladder Agents - mirabegron Failed - 04/15/2023  4:32 PM      Failed - Cr in normal range and within 360 days    Creat  Date Value Ref Range Status  01/28/2023 1.12 (H) 0.60 - 1.00 mg/dL Final   Creatinine, Ser  Date Value Ref Range Status  02/12/2023 1.30 (H) 0.44 - 1.00 mg/dL Final   Creatinine, Urine  Date Value Ref Range Status  08/05/2022 58 20 - 275 mg/dL Final         Failed - Last BP in normal range    BP Readings from Last 1 Encounters:  02/12/23 (!) 143/79         Failed - Valid encounter within last 12 months    Recent Outpatient Visits           2 years ago Type 2 diabetes mellitus with hyperlipidemia (HCC)   [facility]  Donita Brooks, MD   2 years ago Bibasilar crackles   [facility] Donita Brooks, MD   2 years ago Type 2 diabetes mellitus with hyperlipidemia (HCC)   [facility] Donita Brooks, MD   3 years ago Type 2 diabetes mellitus with hyperlipidemia (HCC)   [facility] Medicine Donita Brooks, MD   4 years ago Carpal tunnel syndrome of right wrist   [facility] Pickard, Priscille Heidelberg, MD              Passed - ALT in normal range and within 360 days    ALT  Date Value Ref Range Status  02/12/2023 19 0 - 44 U/L Final         Passed - AST in normal range and within 360 days    AST  Date Value Ref Range Status  02/12/2023 23 15 - 41 U/L Final         Passed - eGFR is 15 or above and within 360 days    GFR, Est African American  Date Value Ref Range Status  08/11/2020 56 (L) > OR = 60 mL/min/1.38m2 Final   GFR, Est Non African American  Date Value Ref Range Status  08/11/2020 48 (L) > OR = 60 mL/min/1.33m2 Final   GFR, Estimated  Date Value Ref Range Status  02/12/2023 43 (L) >60 mL/min Final    Comment:    (NOTE) Calculated using the CKD-EPI Creatinine Equation (2021)    eGFR  Date Value Ref Range Status  01/28/2023 50 (L) > OR = 60 mL/min/1.11m2 Final  12/16/2020 50 (L) >59 mL/min/1.73 Final          traZODone (DESYREL) 50 MG tablet [Pharmacy Med Name: TRAZODONE HYDROCHLORIDE 50MG  TABLET] 90 tablet 1    Sig: TAKE ONE TABLET BY MOUTH EVERY NIGHT AT BEDTIME     Psychiatry: Antidepressants - Serotonin Modulator Failed - 04/15/2023  4:32 PM      Failed - Valid encounter within last 6 months    Recent Outpatient Visits           2 years ago Type 2 diabetes mellitus with hyperlipidemia (HCC)   [facility] Donita Brooks, MD   2 years ago Bibasilar crackles   [facility] Donita Brooks, MD   2 years ago Type 2 diabetes mellitus with hyperlipidemia (HCC)   [facility] Donita Brooks, MD   3 years ago Type 2 diabetes mellitus with hyperlipidemia (HCC)   [facility] Pickard, Priscille Heidelberg, MD   4 years ago Carpal tunnel syndrome of right wrist   [facility] Pickard, Priscille Heidelberg, MD

## 2023-04-24 ENCOUNTER — Encounter: Payer: Self-pay | Admitting: Pharmacist

## 2023-04-24 NOTE — Progress Notes (Addendum)
Pharmacy Quality Measure Review  This patient is appearing on a report for being at risk of failing the adherence measure for hypertension (ACEi/ARB) and statin medications this calendar year.   Medication: losartan 50 mg Last fill date: 10/10 for 90 day supply  Medication: atorvastatin 40 mg Last fill date: 10/9 for 90 day supply  Insurance report was not up to date. No action needed at this time.   Jarrett Ables, PharmD PGY-1 Pharmacy Resident

## 2023-05-20 ENCOUNTER — Other Ambulatory Visit: Payer: Self-pay | Admitting: Family Medicine

## 2023-05-20 NOTE — Telephone Encounter (Signed)
Requested medications are due for refill today.  yes  Requested medications are on the active medications list.  yes  Last refill. 09/13/2022 #30 0 rf  Future visit scheduled.   no  Notes to clinic.  Refill not delegated.    Requested Prescriptions  Pending Prescriptions Disp Refills   QUEtiapine (SEROQUEL) 50 MG tablet [Pharmacy Med Name: QUETIAPINE FUMARATE 50MG  TABLET] 30 tablet 5    Sig: TAKE ONE TABLET BY MOUTH AT BEDTIME     Not Delegated - Psychiatry:  Antipsychotics - Second Generation (Atypical) - quetiapine Failed - 05/20/2023  4:55 PM      Failed - This refill cannot be delegated      Failed - TSH in normal range and within 360 days    TSH  Date Value Ref Range Status  12/15/2021 2.10 0.40 - 4.50 mIU/L Final         Failed - Last BP in normal range    BP Readings from Last 1 Encounters:  02/12/23 (!) 143/79         Failed - Valid encounter within last 6 months    Recent Outpatient Visits           2 years ago Type 2 diabetes mellitus with hyperlipidemia (HCC)   [facility] Pickard, Priscille Heidelberg, MD   2 years ago Bibasilar crackles   [facility] Donita Brooks, MD   2 years ago Type 2 diabetes mellitus with hyperlipidemia (HCC)   [facility] Donita Brooks, MD   3 years ago Type 2 diabetes mellitus with hyperlipidemia (HCC)   [facility] Pickard, Priscille Heidelberg, MD   4 years ago Carpal tunnel syndrome of right wrist   [facility] Pickard, Priscille Heidelberg, MD              Failed - Lipid Panel in normal range within the last 12 months    Cholesterol  Date Value Ref Range Status  08/05/2022 142 <200 mg/dL Final   LDL Cholesterol (Calc)  Date Value Ref Range Status  08/05/2022 64 mg/dL (calc) Final    Comment:    Reference range: <100 . Desirable range <100 mg/dL for primary prevention;   <70 mg/dL for patients with CHD or diabetic patients  with > or = 2 CHD risk  factors. Marland Kitchen LDL-C is now calculated using the Martin-Hopkins  calculation, which is a validated novel method providing  better accuracy than the Friedewald equation in the  estimation of LDL-C.  Horald Pollen et al. Lenox Ahr. 6962;952(84): 2061-2068  (http://education.[facility].com/faq/FAQ164)    Direct LDL  Date Value Ref Range Status  11/22/2019 62 <100 mg/dL Final    Comment:    Greatly elevated Triglycerides values (>1200 mg/dL) interfere with the dLDL assay. As no Triglycerides  testing was ordered, interpret results with caution. . Desirable range <100 mg/dL for primary prevention;   <70 mg/dL for patients with CHD or diabetic patients  with > or = 2 CHD risk factors. Marland Kitchen    HDL  Date Value Ref Range Status  08/05/2022 50 > OR = 50 mg/dL Final   Triglycerides  Date Value Ref Range Status  08/05/2022 228 (H) <150 mg/dL Final    Comment:    . If a non-fasting specimen was collected, consider repeat triglyceride testing on a fasting specimen if clinically indicated.  Perry Mount et al. J. of Clin. Lipidol. 2015;9:129-169. Marland Kitchen  Passed - Last Heart Rate in normal range    Pulse Readings from Last 1 Encounters:  02/12/23 (!) 53         Passed - CBC within normal limits and completed in the last 12 months    WBC  Date Value Ref Range Status  02/12/2023 7.7 4.0 - 10.5 K/uL Final   RBC  Date Value Ref Range Status  02/12/2023 3.89 3.87 - 5.11 MIL/uL Final   Hemoglobin  Date Value Ref Range Status  02/12/2023 13.3 12.0 - 15.0 g/dL Final  28/41/3244 01.0 11.1 - 15.9 g/dL Final   HCT  Date Value Ref Range Status  02/12/2023 39.0 36.0 - 46.0 % Final   Hematocrit  Date Value Ref Range Status  12/16/2020 36.5 34.0 - 46.6 % Final   MCHC  Date Value Ref Range Status  02/12/2023 31.9 30.0 - 36.0 g/dL Final   [facility]  Date Value Ref Range Status  02/12/2023 30.3 26.0 - 34.0 pg Final   MCV  Date Value Ref Range Status  02/12/2023 95.1 80.0 - 100.0 fL Final   12/16/2020 91 79 - 97 fL Final   No results found for: "PLTCOUNTKUC", "LABPLAT", "POCPLA" RDW  Date Value Ref Range Status  02/12/2023 14.8 11.5 - 15.5 % Final  12/16/2020 13.7 11.7 - 15.4 % Final         Passed - CMP within normal limits and completed in the last 12 months    Albumin  Date Value Ref Range Status  02/12/2023 3.0 (L) 3.5 - 5.0 g/dL Final  27/25/3664 4.5 3.7 - 4.7 g/dL Final   Alkaline Phosphatase  Date Value Ref Range Status  02/12/2023 60 38 - 126 U/L Final   Alkaline phosphatase (APISO)  Date Value Ref Range Status  01/28/2023 74 37 - 153 U/L Final   ALT  Date Value Ref Range Status  02/12/2023 19 0 - 44 U/L Final   AST  Date Value Ref Range Status  02/12/2023 23 15 - 41 U/L Final   BUN  Date Value Ref Range Status  02/12/2023 20 8 - 23 mg/dL Final  40/34/7425 17 8 - 27 mg/dL Final   Calcium  Date Value Ref Range Status  02/12/2023 8.3 (L) 8.9 - 10.3 mg/dL Final   Calcium, Ion  Date Value Ref Range Status  02/12/2023 1.17 1.15 - 1.40 mmol/L Final   CO2  Date Value Ref Range Status  02/12/2023 19 (L) 22 - 32 mmol/L Final   TCO2  Date Value Ref Range Status  02/12/2023 19 (L) 22 - 32 mmol/L Final   Creat  Date Value Ref Range Status  01/28/2023 1.12 (H) 0.60 - 1.00 mg/dL Final   Creatinine, Ser  Date Value Ref Range Status  02/12/2023 1.30 (H) 0.44 - 1.00 mg/dL Final   Creatinine, Urine  Date Value Ref Range Status  08/05/2022 58 20 - 275 mg/dL Final   Glucose, Bld  Date Value Ref Range Status  02/12/2023 161 (H) 70 - 99 mg/dL Final    Comment:    Glucose reference range applies only to samples taken after fasting for at least 8 hours.   Glucose-Capillary  Date Value Ref Range Status  12/30/2022 109 (H) 70 - 99 mg/dL Final    Comment:    Glucose reference range applies only to samples taken after fasting for at least 8 hours.   Potassium  Date Value Ref Range Status  02/12/2023 4.3 3.5 - 5.1 mmol/L Final   Sodium  Date Value Ref Range Status  02/12/2023 137 135 - 145 mmol/L Final  12/16/2020 143 134 - 144 mmol/L Final   Total Bilirubin  Date Value Ref Range Status  02/12/2023 0.5 0.3 - 1.2 mg/dL Final   Bilirubin Total  Date Value Ref Range Status  12/16/2020 0.3 0.0 - 1.2 mg/dL Final   Bilirubin, Direct  Date Value Ref Range Status  06/18/2016 0.1 <=0.2 mg/dL Final   Indirect Bilirubin  Date Value Ref Range Status  06/18/2016 0.4 0.2 - 1.2 mg/dL Final   Protein, ur  Date Value Ref Range Status  02/12/2023 NEGATIVE NEGATIVE mg/dL Final   Total Protein, Urine  Date Value Ref Range Status  08/05/2022 23 5 - 24 mg/dL Final   Total Protein  Date Value Ref Range Status  02/12/2023 5.3 (L) 6.5 - 8.1 g/dL Final  52/84/1324 7.4 6.0 - 8.5 g/dL Final   GFR, Est African American  Date Value Ref Range Status  08/11/2020 56 (L) > OR = 60 mL/min/1.70m2 Final   eGFR  Date Value Ref Range Status  01/28/2023 50 (L) > OR = 60 mL/min/1.81m2 Final  12/16/2020 50 (L) >59 mL/min/1.73 Final   GFR, Est Non African American  Date Value Ref Range Status  08/11/2020 48 (L) > OR = 60 mL/min/1.13m2 Final   GFR, Estimated  Date Value Ref Range Status  02/12/2023 43 (L) >60 mL/min Final    Comment:    (NOTE) Calculated using the CKD-EPI Creatinine Equation (2021)

## 2023-05-20 NOTE — Telephone Encounter (Signed)
Requested Prescriptions  Pending Prescriptions Disp Refills   JARDIANCE 25 MG TABS tablet [Pharmacy Med Name: JARDIANCE 25MG  TABLET] 90 tablet 3    Sig: TAKE ONE TABLET BY MOUTH ONCE A DAY     Endocrinology:  Diabetes - SGLT2 Inhibitors Failed - 05/20/2023  4:19 PM      Failed - Cr in normal range and within 360 days    Creat  Date Value Ref Range Status  01/28/2023 1.12 (H) 0.60 - 1.00 mg/dL Final   Creatinine, Ser  Date Value Ref Range Status  02/12/2023 1.30 (H) 0.44 - 1.00 mg/dL Final   Creatinine, Urine  Date Value Ref Range Status  08/05/2022 58 20 - 275 mg/dL Final         Failed - eGFR in normal range and within 360 days    GFR, Est African American  Date Value Ref Range Status  08/11/2020 56 (L) > OR = 60 mL/min/1.80m2 Final   GFR, Est Non African American  Date Value Ref Range Status  08/11/2020 48 (L) > OR = 60 mL/min/1.4m2 Final   GFR, Estimated  Date Value Ref Range Status  02/12/2023 43 (L) >60 mL/min Final    Comment:    (NOTE) Calculated using the CKD-EPI Creatinine Equation (2021)    eGFR  Date Value Ref Range Status  01/28/2023 50 (L) > OR = 60 mL/min/1.59m2 Final  12/16/2020 50 (L) >59 mL/min/1.73 Final         Failed - Valid encounter within last 6 months    Recent Outpatient Visits           2 years ago Type 2 diabetes mellitus with hyperlipidemia (HCC)   [facility] Donita Brooks, MD   2 years ago Bibasilar crackles   [facility] Donita Brooks, MD   2 years ago Type 2 diabetes mellitus with hyperlipidemia (HCC)   [facility] Donita Brooks, MD   3 years ago Type 2 diabetes mellitus with hyperlipidemia (HCC)   [facility] Donita Brooks, MD   4 years ago Carpal tunnel syndrome of right wrist   [facility] Pickard, Priscille Heidelberg, MD              Passed - HBA1C is between 0 and 7.9 and within 180 days    Hgb A1C (fingerstick)  Date Value  Ref Range Status  06/04/2016 7.1 (H) <5.7 % Final    Comment:                                                                           According to the ADA Clinical Practice Recommendations for 2011, when HbA1c is used as a screening test:     >=6.5%   Diagnostic of Diabetes Mellitus            (if abnormal result is confirmed)   5.7-6.4%   Increased risk of developing Diabetes Mellitus   References:Diagnosis and Classification of Diabetes Mellitus,Diabetes Care,2011,34(Suppl 1):S62-S69 and Standards of Medical Care in         Diabetes - 2011,Diabetes Care,2011,34 (Suppl 1):S11-S61.      Hgb A1c MFr Bld  Date Value Ref  Range Status  01/28/2023 5.9 (H) <5.7 % of total Hgb Final    Comment:    For someone without known diabetes, a hemoglobin  A1c value between 5.7% and 6.4% is consistent with prediabetes and should be confirmed with a  follow-up test. . For someone with known diabetes, a value <7% indicates that their diabetes is well controlled. A1c targets should be individualized based on duration of diabetes, age, comorbid conditions, and other considerations. . This assay result is consistent with an increased risk of diabetes. . Currently, no consensus exists regarding use of hemoglobin A1c for diagnosis of diabetes for children. Kathryn Wall

## 2023-05-20 NOTE — Telephone Encounter (Signed)
Requested Prescriptions  Pending Prescriptions Disp Refills   apixaban (ELIQUIS) 5 MG TABS tablet [Pharmacy Med Name: ELIQUIS 5MG  TABLET] 180 tablet 2    Sig: TAKE ONE TABLET BY MOUTH TWICE A DAY     Hematology:  Anticoagulants - apixaban Failed - 05/20/2023  4:17 PM      Failed - Cr in normal range and within 360 days    Creat  Date Value Ref Range Status  01/28/2023 1.12 (H) 0.60 - 1.00 mg/dL Final   Creatinine, Ser  Date Value Ref Range Status  02/12/2023 1.30 (H) 0.44 - 1.00 mg/dL Final   Creatinine, Urine  Date Value Ref Range Status  08/05/2022 58 20 - 275 mg/dL Final         Failed - Valid encounter within last 12 months    Recent Outpatient Visits           2 years ago Type 2 diabetes mellitus with hyperlipidemia (HCC)   [facility] Donita Brooks, MD   2 years ago Bibasilar crackles   [facility] Donita Brooks, MD   2 years ago Type 2 diabetes mellitus with hyperlipidemia (HCC)   [facility] Pickard, Priscille Heidelberg, MD   3 years ago Type 2 diabetes mellitus with hyperlipidemia (HCC)   [facility] Pickard, Priscille Heidelberg, MD   4 years ago Carpal tunnel syndrome of right wrist   [facility] Pickard, Priscille Heidelberg, MD              Passed - PLT in normal range and within 360 days    Platelets  Date Value Ref Range Status  02/12/2023 241 150 - 400 K/uL Final  12/16/2020 335 150 - 450 x10E3/uL Final         Passed - HGB in normal range and within 360 days    Hemoglobin  Date Value Ref Range Status  02/12/2023 13.3 12.0 - 15.0 g/dL Final  16/03/9603 54.0 11.1 - 15.9 g/dL Final         Passed - HCT in normal range and within 360 days    HCT  Date Value Ref Range Status  02/12/2023 39.0 36.0 - 46.0 % Final   Hematocrit  Date Value Ref Range Status  12/16/2020 36.5 34.0 - 46.6 % Final         Passed - AST in normal range and within 360 days    AST  Date Value Ref Range Status   02/12/2023 23 15 - 41 U/L Final         Passed - ALT in normal range and within 360 days    ALT  Date Value Ref Range Status  02/12/2023 19 0 - 44 U/L Final

## 2023-07-15 ENCOUNTER — Other Ambulatory Visit: Payer: Self-pay | Admitting: Family Medicine

## 2023-08-23 ENCOUNTER — Other Ambulatory Visit: Payer: Self-pay | Admitting: Family Medicine

## 2023-09-01 ENCOUNTER — Other Ambulatory Visit: Payer: Self-pay | Admitting: Family Medicine

## 2023-09-13 ENCOUNTER — Encounter: Payer: Self-pay | Admitting: Family Medicine

## 2023-09-13 ENCOUNTER — Ambulatory Visit: Admitting: Family Medicine

## 2023-09-13 VITALS — BP 130/76 | HR 93 | Temp 97.6°F | Ht 65.0 in | Wt 166.8 lb

## 2023-09-13 DIAGNOSIS — I10 Essential (primary) hypertension: Secondary | ICD-10-CM | POA: Diagnosis not present

## 2023-09-13 DIAGNOSIS — E1169 Type 2 diabetes mellitus with other specified complication: Secondary | ICD-10-CM | POA: Diagnosis not present

## 2023-09-13 DIAGNOSIS — E785 Hyperlipidemia, unspecified: Secondary | ICD-10-CM

## 2023-09-13 DIAGNOSIS — Z7984 Long term (current) use of oral hypoglycemic drugs: Secondary | ICD-10-CM

## 2023-09-13 NOTE — Progress Notes (Signed)
 Subjective:    Patient ID: Kathryn Wall, female    DOB: [birth year], 78 y.o.   MRN: [id number]  Wt Readings from Last 3 Encounters:  09/13/23 166 lb 12.8 oz (75.7 kg)  02/12/23 142 lb (64.4 kg)  02/10/23 145 lb (65.8 kg)   Since I last saw the patient, she independently discontinued Eliquis due to cost.  She has a history of paroxysmal atrial fibrillation.  She does report that she is falling occasionally.  1 time she fell and had bruising on her neck and lower head from where she hit the ground.  However she states that she has not fallen in the last 6 months.  She walks with a walker at home.  She is still driving.  She uses a cane whenever she leaves the house.  Her falls are typically due to neuropathy.  She has numbness in her left foot.  We had a long discussion today and I believe that her stroke risk is higher than her fall risk.  Therefore I would resume the Eliquis.  She also independently discontinued metformin.  She is not checking her sugars.  She believes that she is still taking Jardiance.  Her blood pressure today is well-controlled.  She denies any chest pain shortness of breath or dyspnea on exertion.  She continues to endorse trouble sleeping and she is requesting Ambien.  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    CKD (chronic kidney disease), stage III (HCC)    Cognitive deficits    mild   Depression    Diabetes mellitus    TYPE II   High cholesterol    Hypertension    PAF (paroxysmal atrial fibrillation) [facility])    Past Surgical History:  Procedure Laterality Date   ABDOMINAL HYSTERECTOMY  06/29/1983   TAH   ANKLE SURGERY Right    APPLICATION OF WOUND VAC Left 01/14/2022   Procedure: APPLICATION OF WOUND VAC;  Surgeon: Peggye Form, DO;   Location: [facility] OR;  Service: Plastics;  Laterality: Left;   BREAST SURGERY  06/29/1995   RIGHT BREAST LUMPECTOMY   CHOLECYSTECTOMY  06/28/1978   COLONOSCOPY     Current Outpatient Medications on File Prior to Visit  Medication Sig Dispense Refill   acetaminophen (TYLENOL) 500 MG tablet Take 1,000 mg by mouth daily as needed for moderate pain, fever or headache.     amLODipine (NORVASC) 5 MG tablet TAKE 1 TABLET BY MOUTH ONCE A DAY (Patient taking differently: Take 5 mg by mouth daily.) 90 tablet 3   apixaban (ELIQUIS) 5 MG TABS tablet TAKE ONE TABLET BY MOUTH TWICE A DAY 180 tablet 2   atorvastatin (LIPITOR) 40 MG tablet TAKE ONE TABLET BY MOUTH ONCE A DAY 90 tablet 2   JARDIANCE 25 MG TABS tablet TAKE ONE TABLET BY MOUTH ONCE A DAY 90 tablet 0   losartan (COZAAR) 50 MG tablet TAKE ONE TABLET BY MOUTH ONCE A DAY 100 tablet 0   metFORMIN (GLUCOPHAGE-XR) 500 MG 24 hr tablet TAKE FOUR TABLETS BY MOUTH ONCE DAILY WITH BREAKFAST. 360 tablet 1   metoprolol succinate (TOPROL-XL) 25 MG 24 hr tablet TAKE ONE TABLET BY MOUTH DAILY 90 tablet 1   mirabegron ER (MYRBETRIQ) 25 MG TB24 tablet TAKE ONE TABLET  BY MOUTH ONCE A DAY 90 tablet 1   Multiple Vitamins-Minerals (ONE A DAY WOMEN 50 PLUS) CHEW Chew 1 each by mouth daily.     ondansetron (ZOFRAN-ODT) 4 MG disintegrating tablet Take 4 mg by mouth every 8 (eight) hours as needed for nausea or vomiting.     ondansetron (ZOFRAN-ODT) 4 MG disintegrating tablet Take 1 tablet (4 mg total) by mouth every 8 (eight) hours as needed for nausea or vomiting. 12 tablet 0   pantoprazole (PROTONIX) 40 MG tablet TAKE ONE TABLET BY MOUTH EVERY MORNING (Patient taking differently: Take 40 mg by mouth daily.) 90 tablet 2   QUEtiapine (SEROQUEL) 50 MG tablet TAKE ONE TABLET BY MOUTH ONCE A DAY 30 tablet 0   traZODone (DESYREL) 50 MG tablet TAKE ONE TABLET BY MOUTH EVERY NIGHT AT BEDTIME 90 tablet 1   venlafaxine XR (EFFEXOR-XR) 75 MG 24 hr capsule TAKE ONE CAPSULE BY MOUTH  ONCE DAILY WITH BREAKFAST (Patient taking differently: Take 75 mg by mouth daily with breakfast.) 90 capsule 2   No current facility-administered medications on file prior to visit.   Allergies  Allergen Reactions   Ambien [Zolpidem Tartrate] Other (See Comments)    Sleep walking   Cipro [Ciprofloxacin Hcl] Nausea Only   Cymbalta [Duloxetine Hcl] Other (See Comments)    Unknown Reaction   Other     SENSITIVE TO ANTIBIOTICS   Cardura [Doxazosin] Nausea And Vomiting   Percocet [Oxycodone-Acetaminophen] Nausea And Vomiting   Social History   Socioeconomic History   Marital status: Married    Spouse name: Not on file   Number of children: Not on file   Years of education: Not on file   Highest education level: Not on file  Occupational History   Not on file  Tobacco Use   Smoking status: Never   Smokeless tobacco: Never  Vaping Use   Vaping status: Never Used  Substance and Sexual Activity   Alcohol use: No   Drug use: No   Sexual activity: Yes    Birth control/protection: Post-menopausal, Surgical    Comment: hysterectomy  Other Topics Concern   Not on file  Social History Narrative   Not on file   Social Drivers of Health   Financial Resource Strain: Low Risk  (02/10/2023)   Overall Financial Resource Strain (CARDIA)    Difficulty of Paying Living Expenses: Not hard at all  Food Insecurity: No Food Insecurity (02/10/2023)   Hunger Vital Sign    Worried About Running Out of Food in the Last Year: Never true    Ran Out of Food in the Last Year: Never true  Transportation Needs: No Transportation Needs (02/10/2023)   PRAPARE - Administrator, Civil Service (Medical): No    Lack of Transportation (Non-Medical): No  Physical Activity: Insufficiently Active (02/10/2023)   Exercise Vital Sign    Days of Exercise per Week: 3 days    Minutes of Exercise per Session: 30 min  Stress: No Stress Concern Present (02/10/2023)   [employer] of Occupational Health  - Occupational Stress Questionnaire    Feeling of Stress : Not at all  Social Connections: Moderately Integrated (02/10/2023)   Social Connection and Isolation Panel [NHANES]    Frequency of Communication with Friends and Family: More than three times a week    Frequency of Social Gatherings with Friends and Family: Three times a week    Attends Religious Services: 1 to 4 times per year    Active  Member of Clubs or Organizations: No    Attends Banker Meetings: Never    Marital Status: Married  Catering manager Violence: Not At Risk (02/10/2023)   Humiliation, Afraid, Rape, and Kick questionnaire    Fear of Current or Ex-Partner: No    Emotionally Abused: No    Physically Abused: No    Sexually Abused: No   Family History  Problem Relation Age of Onset   Cancer Mother        COLON   Hypertension Father    Heart disease Father       Review of Systems  All other systems reviewed and are negative.      Objective:   Physical Exam Vitals reviewed.  Constitutional:      General: She is not in acute distress.    Appearance: She is well-developed. She is not diaphoretic.  HENT:     Head: Normocephalic and atraumatic.     Right Ear: External ear normal.     Left Ear: External ear normal.     Nose: Nose normal.     Mouth/Throat:     Pharynx: No oropharyngeal exudate.  Eyes:     General: No scleral icterus.       Right eye: No discharge.        Left eye: No discharge.     Conjunctiva/sclera: Conjunctivae normal.     Pupils: Pupils are equal, round, and reactive to light.  Neck:     Thyroid: No thyromegaly.     Trachea: No tracheal deviation.  Cardiovascular:     Rate and Rhythm: Normal rate and regular rhythm.     Heart sounds: Normal heart sounds. No murmur heard.    No friction rub. No gallop.  Pulmonary:     Effort: Pulmonary effort is normal. No respiratory distress.     Breath sounds: Normal breath sounds. No stridor. No wheezing or rales.  Abdominal:      General: Bowel sounds are normal. There is no distension.     Palpations: Abdomen is soft. There is no mass.     Tenderness: There is no abdominal tenderness. There is no guarding or rebound.  Musculoskeletal:     Cervical back: Normal range of motion and neck supple.  Lymphadenopathy:     Cervical: No cervical adenopathy.  Skin:    General: Skin is warm.     Coloration: Skin is not pale.     Findings: No erythema or rash.  Neurological:     Mental Status: She is alert and oriented to person, place, and time.     Cranial Nerves: No cranial nerve deficit.     Motor: No abnormal muscle tone.     Coordination: Coordination normal.     Deep Tendon Reflexes: Reflexes normal.  Psychiatric:        Mood and Affect: Mood normal.        Speech: Speech normal.        Behavior: Behavior normal.        Thought Content: Thought content normal.        Judgment: Judgment normal.           Assessment & Plan:  Type 2 diabetes mellitus with hyperlipidemia (HCC) - Plan: Hemoglobin A1c, CBC with Differential/Platelet, COMPLETE METABOLIC PANEL WITH GFR, Microalbumin/Creatinine Ratio, Urine  Essential hypertension, benign Patient's blood pressure today is well-controlled.  She is in normal sinus rhythm today.  I did recommend that she resume Eliquis twice daily for primary  prevention of stroke due to paroxysmal atrial fibrillation.  I will check a CBC a CMP and a hemoglobin A1c today.  I cannot check a fasting lipid panel as she is not fasting.  I will also get a urine protein creatinine ratio.  Ideally I would like to see her A1c less than 7.  Hopefully the Jardiance is controlling her sugars.  The patient denies any recent urinary tract infections or yeast infections.  I recommended against adding Ambien at night to help her sleep due to the risk of falls sedation and confusion.

## 2023-09-14 ENCOUNTER — Telehealth: Payer: Self-pay

## 2023-09-14 LAB — COMPLETE METABOLIC PANEL WITH GFR
AG Ratio: 1.5 (calc) (ref 1.0–2.5)
ALT: 19 U/L (ref 6–29)
AST: 19 U/L (ref 10–35)
Albumin: 3.9 g/dL (ref 3.6–5.1)
Alkaline phosphatase (APISO): 100 U/L (ref 37–153)
BUN/Creatinine Ratio: 16 (calc) (ref 6–22)
BUN: 21 mg/dL (ref 7–25)
CO2: 20 mmol/L (ref 20–32)
Calcium: 8.7 mg/dL (ref 8.6–10.4)
Chloride: 95 mmol/L — ABNORMAL LOW (ref 98–110)
Creat: 1.33 mg/dL — ABNORMAL HIGH (ref 0.60–1.00)
Globulin: 2.6 g/dL (ref 1.9–3.7)
Glucose, Bld: 650 mg/dL (ref 65–99)
Potassium: 5.4 mmol/L — ABNORMAL HIGH (ref 3.5–5.3)
Sodium: 127 mmol/L — ABNORMAL LOW (ref 135–146)
Total Bilirubin: 0.5 mg/dL (ref 0.2–1.2)
Total Protein: 6.5 g/dL (ref 6.1–8.1)

## 2023-09-14 LAB — HEMOGLOBIN A1C
Hgb A1c MFr Bld: 11.5 %{Hb} — ABNORMAL HIGH (ref <5.7)
Mean Plasma Glucose: 283 mg/dL
eAG (mmol/L): 15.7 mmol/L

## 2023-09-14 LAB — CBC WITH DIFFERENTIAL/PLATELET
Absolute Lymphocytes: 1656 {cells}/uL (ref 850–3900)
Absolute Monocytes: 488 {cells}/uL (ref 200–950)
Basophils Absolute: 88 {cells}/uL (ref 0–200)
Basophils Relative: 1.1 %
Eosinophils Absolute: 208 {cells}/uL (ref 15–500)
Eosinophils Relative: 2.6 %
HCT: 42.9 % (ref 35.0–45.0)
Hemoglobin: 13.6 g/dL (ref 11.7–15.5)
MCH: 30.3 pg (ref 27.0–33.0)
MCHC: 31.7 g/dL — ABNORMAL LOW (ref 32.0–36.0)
MCV: 95.5 fL (ref 80.0–100.0)
MPV: 12.5 fL (ref 7.5–12.5)
Monocytes Relative: 6.1 %
Neutro Abs: 5560 {cells}/uL (ref 1500–7800)
Neutrophils Relative %: 69.5 %
Platelets: 269 10*3/uL (ref 140–400)
RBC: 4.49 10*6/uL (ref 3.80–5.10)
RDW: 13.1 % (ref 11.0–15.0)
Total Lymphocyte: 20.7 %
WBC: 8 10*3/uL (ref 3.8–10.8)

## 2023-09-14 LAB — MICROALBUMIN / CREATININE URINE RATIO
Creatinine, Urine: 23 mg/dL (ref 20–275)
Microalb Creat Ratio: 30 mg/g{creat} — ABNORMAL HIGH (ref <30)
Microalb, Ur: 0.7 mg/dL

## 2023-09-14 NOTE — Telephone Encounter (Signed)
 Call from [place] at [facility] with blood glucose result on patient from 09/13/2023, result of 650. Results in patient's chart. Thankyou.

## 2023-09-16 ENCOUNTER — Other Ambulatory Visit: Payer: Self-pay

## 2023-09-16 DIAGNOSIS — E1122 Type 2 diabetes mellitus with diabetic chronic kidney disease: Secondary | ICD-10-CM

## 2023-09-16 DIAGNOSIS — E1169 Type 2 diabetes mellitus with other specified complication: Secondary | ICD-10-CM

## 2023-09-16 MED ORDER — INSULIN GLARGINE 100 UNIT/ML ~~LOC~~ SOLN: SUBCUTANEOUS | 3 refills | Status: DC

## 2023-09-16 MED ORDER — INSULIN PEN NEEDLE 31G X 8 MM MISC: 3 refills | Status: DC

## 2023-09-16 MED ORDER — LANTUS SOLOSTAR 100 UNIT/ML ~~LOC~~ SOPN: 15.0000 [IU] | PEN_INJECTOR | Freq: Every day | SUBCUTANEOUS | 99 refills | Status: DC

## 2023-09-19 ENCOUNTER — Ambulatory Visit: Admitting: Family Medicine

## 2023-09-23 ENCOUNTER — Telehealth: Payer: Self-pay

## 2023-09-23 DIAGNOSIS — Z800 Family history of malignant neoplasm of digestive organs: Secondary | ICD-10-CM | POA: Insufficient documentation

## 2023-09-23 NOTE — Telephone Encounter (Signed)
 Copied from CRM [id number]. Topic: General - Phone/Fax/Address >> Sep 23, 2023 11:51 AM Antwanette L wrote: Bridgette from [facility] sent over a fax on 3/28 requesting office notes from the patients last two appointments. Marland Kitchen

## 2023-09-23 NOTE — Telephone Encounter (Signed)
 Copied from CRM [id number]. Topic: Clinical - Medication Question >> Sep 23, 2023 11:53 AM Antwanette L wrote: Reason for CRM: Bridgette from [facility] needs a verbal verification for the patient insulin. Bridgette needs to confirm the insulin injection frequency. Hughie Closs is requesting a callback at [phone]

## 2023-09-24 ENCOUNTER — Other Ambulatory Visit: Payer: Self-pay | Admitting: Family Medicine

## 2023-09-24 DIAGNOSIS — I10 Essential (primary) hypertension: Secondary | ICD-10-CM

## 2023-09-26 ENCOUNTER — Telehealth: Payer: Self-pay

## 2023-09-26 NOTE — Telephone Encounter (Signed)
 Copied from CRM [id number]. Topic: Clinical - Medication Question >> Sep 23, 2023 11:53 AM Antwanette L wrote: Reason for CRM: Bridgette from [facility] needs a verbal verification for the patient insulin. Bridgette needs to confirm the insulin injection frequency. Hughie Closs is requesting a callback at [phone]

## 2023-09-27 ENCOUNTER — Encounter: Payer: Self-pay | Admitting: Family Medicine

## 2023-09-27 ENCOUNTER — Telehealth: Payer: Self-pay

## 2023-09-27 ENCOUNTER — Ambulatory Visit: Admitting: Family Medicine

## 2023-09-27 ENCOUNTER — Other Ambulatory Visit: Payer: Self-pay

## 2023-09-27 DIAGNOSIS — E1122 Type 2 diabetes mellitus with diabetic chronic kidney disease: Secondary | ICD-10-CM

## 2023-09-27 DIAGNOSIS — Z7984 Long term (current) use of oral hypoglycemic drugs: Secondary | ICD-10-CM

## 2023-09-27 DIAGNOSIS — E1169 Type 2 diabetes mellitus with other specified complication: Secondary | ICD-10-CM

## 2023-09-27 DIAGNOSIS — N1831 Chronic kidney disease, stage 3a: Secondary | ICD-10-CM

## 2023-09-27 DIAGNOSIS — E785 Hyperlipidemia, unspecified: Secondary | ICD-10-CM | POA: Diagnosis not present

## 2023-09-27 MED ORDER — DEXCOM G7 SENSOR MISC: 1 refills | Status: DC

## 2023-09-27 MED ORDER — LANTUS SOLOSTAR 100 UNIT/ML ~~LOC~~ SOPN: 44.0000 [IU] | PEN_INJECTOR | Freq: Every day | SUBCUTANEOUS | 99 refills | Status: DC

## 2023-09-27 MED ORDER — DEXCOM G7 RECEIVER DEVI: 1.0000 | Freq: Once | 2 refills | Status: AC

## 2023-09-27 MED ORDER — DEXCOM G7 SENSOR MISC: 1.0000 | 11 refills | Status: DC

## 2023-09-27 NOTE — Telephone Encounter (Signed)
 Copied from CRM [id number]. Topic: Clinical - Prescription Issue >> Sep 27, 2023  4:45 PM Shelah Lewandowsky wrote: Reason for CRM: Continuous Glucose Sensor (DEXCOM G7 SENSOR) MISC- instructions need to be changed- sensor only lasts 10 days and the instructions say to change every 14 days, needs to be a quantity of 3 to last a month- Thayer Ohm with [employer] [phone]

## 2023-09-27 NOTE — Telephone Encounter (Signed)
 Requested medication (s) are due for refill today: yes  Requested medication (s) are on the active medication list: yes  Last refill:  2/25/5  Future visit scheduled: yes  Notes to clinic:  Unable to refill per protocol, cannot delegate.      Requested Prescriptions  Pending Prescriptions Disp Refills   QUEtiapine (SEROQUEL) 50 MG tablet [Pharmacy Med Name: QUETIAPINE FUMARATE 50MG  TABLET] 30 tablet 0    Sig: TAKE ONE TABLET BY MOUTH ONCE A DAY     Not Delegated - Psychiatry:  Antipsychotics - Second Generation (Atypical) - quetiapine Failed - 09/27/2023 11:16 AM      Failed - This refill cannot be delegated      Failed - TSH in normal range and within 360 days    TSH  Date Value Ref Range Status  12/15/2021 2.10 0.40 - 4.50 mIU/L Final         Failed - Valid encounter within last 6 months    Recent Outpatient Visits           2 weeks ago Type 2 diabetes mellitus with hyperlipidemia (HCC)   [place] [facility] Medicine Donita Brooks, MD   8 months ago Type 2 diabetes mellitus with hyperlipidemia [facility])   [place] [facility] Pickard, Priscille Heidelberg, MD   1 year ago Encounter for screening mammogram for malignant neoplasm of breast   [place] [facility] Family Medicine Donita Brooks, MD   1 year ago Type 2 diabetes mellitus with hyperlipidemia [facility])   [place] [facility] Medicine Donita Brooks, MD              Failed - Lipid Panel in normal range within the last 12 months    Cholesterol  Date Value Ref Range Status  08/05/2022 142 <200 mg/dL Final   LDL Cholesterol (Calc)  Date Value Ref Range Status  08/05/2022 64 mg/dL (calc) Final    Comment:    Reference range: <100 . Desirable range <100 mg/dL for primary prevention;   <70 mg/dL for patients with CHD or diabetic patients  with > or = 2 CHD risk factors. Marland Kitchen LDL-C is now calculated using the Martin-Hopkins  calculation, which is a validated novel method  providing  better accuracy than the Friedewald equation in the  estimation of LDL-C.  Horald Pollen et al. Lenox Ahr. 6045;409(81): 2061-2068  (http://education.[facility].com/faq/FAQ164)    Direct LDL  Date Value Ref Range Status  11/22/2019 62 <100 mg/dL Final    Comment:    Greatly elevated Triglycerides values (>1200 mg/dL) interfere with the dLDL assay. As no Triglycerides  testing was ordered, interpret results with caution. . Desirable range <100 mg/dL for primary prevention;   <70 mg/dL for patients with CHD or diabetic patients  with > or = 2 CHD risk factors. Marland Kitchen    HDL  Date Value Ref Range Status  08/05/2022 50 > OR = 50 mg/dL Final   Triglycerides  Date Value Ref Range Status  08/05/2022 228 (H) <150 mg/dL Final    Comment:    . If a non-fasting specimen was collected, consider repeat triglyceride testing on a fasting specimen if clinically indicated.  Perry Mount et al. J. of Clin. Lipidol. 2015;9:129-169. .          Failed - CBC within normal limits and completed in the last 12 months    WBC  Date Value Ref Range Status  09/13/2023 8.0 3.8 - 10.8 Thousand/uL Final  RBC  Date Value Ref Range Status  09/13/2023 4.49 3.80 - 5.10 Million/uL Final   Hemoglobin  Date Value Ref Range Status  09/13/2023 13.6 11.7 - 15.5 g/dL Final  16/03/9603 54.0 11.1 - 15.9 g/dL Final   HCT  Date Value Ref Range Status  09/13/2023 42.9 35.0 - 45.0 % Final   Hematocrit  Date Value Ref Range Status  12/16/2020 36.5 34.0 - 46.6 % Final   MCHC  Date Value Ref Range Status  09/13/2023 31.7 (L) 32.0 - 36.0 g/dL Final    Comment:    For adults, a slight decrease in the calculated MCHC value (in the range of 30 to 32 g/dL) is most likely not clinically significant; however, it should be interpreted with caution in correlation with other red cell parameters and the patient's clinical condition.    [facility]  Date Value Ref Range Status  09/13/2023 30.3 27.0 - 33.0 pg  Final   MCV  Date Value Ref Range Status  09/13/2023 95.5 80.0 - 100.0 fL Final  12/16/2020 91 79 - 97 fL Final   No results found for: "PLTCOUNTKUC", "LABPLAT", "POCPLA" RDW  Date Value Ref Range Status  09/13/2023 13.1 11.0 - 15.0 % Final  12/16/2020 13.7 11.7 - 15.4 % Final         Passed - Last BP in normal range    BP Readings from Last 1 Encounters:  09/13/23 130/76         Passed - Last Heart Rate in normal range    Pulse Readings from Last 1 Encounters:  09/13/23 93         Passed - CMP within normal limits and completed in the last 12 months    Albumin  Date Value Ref Range Status  02/12/2023 3.0 (L) 3.5 - 5.0 g/dL Final  98/04/9146 4.5 3.7 - 4.7 g/dL Final   Alkaline Phosphatase  Date Value Ref Range Status  02/12/2023 60 38 - 126 U/L Final   Alkaline phosphatase (APISO)  Date Value Ref Range Status  09/13/2023 100 37 - 153 U/L Final   ALT  Date Value Ref Range Status  09/13/2023 19 6 - 29 U/L Final   AST  Date Value Ref Range Status  09/13/2023 19 10 - 35 U/L Final   BUN  Date Value Ref Range Status  09/13/2023 21 7 - 25 mg/dL Final  82/95/6213 17 8 - 27 mg/dL Final   Calcium  Date Value Ref Range Status  09/13/2023 8.7 8.6 - 10.4 mg/dL Final   Calcium, Ion  Date Value Ref Range Status  02/12/2023 1.17 1.15 - 1.40 mmol/L Final   CO2  Date Value Ref Range Status  09/13/2023 20 20 - 32 mmol/L Final   TCO2  Date Value Ref Range Status  02/12/2023 19 (L) 22 - 32 mmol/L Final   Creat  Date Value Ref Range Status  09/13/2023 1.33 (H) 0.60 - 1.00 mg/dL Final   Creatinine, Urine  Date Value Ref Range Status  09/13/2023 23 20 - 275 mg/dL Final   Glucose, Bld  Date Value Ref Range Status  09/13/2023 650 (HH) 65 - 99 mg/dL Final    Comment:    Verified by repeat analysis. Marland Kitchen .            Fasting reference interval . For someone without known diabetes, a glucose value >125 mg/dL indicates that they may have diabetes and this should  be confirmed with a follow-up test. .  Glucose-Capillary  Date Value Ref Range Status  12/30/2022 109 (H) 70 - 99 mg/dL Final    Comment:    Glucose reference range applies only to samples taken after fasting for at least 8 hours.   Potassium  Date Value Ref Range Status  09/13/2023 5.4 (H) 3.5 - 5.3 mmol/L Final   Sodium  Date Value Ref Range Status  09/13/2023 127 (L) 135 - 146 mmol/L Final  12/16/2020 143 134 - 144 mmol/L Final   Total Bilirubin  Date Value Ref Range Status  09/13/2023 0.5 0.2 - 1.2 mg/dL Final   Bilirubin Total  Date Value Ref Range Status  12/16/2020 0.3 0.0 - 1.2 mg/dL Final   Bilirubin, Direct  Date Value Ref Range Status  06/18/2016 0.1 <=0.2 mg/dL Final   Indirect Bilirubin  Date Value Ref Range Status  06/18/2016 0.4 0.2 - 1.2 mg/dL Final   Protein, ur  Date Value Ref Range Status  02/12/2023 NEGATIVE NEGATIVE mg/dL Final   Total Protein, Urine  Date Value Ref Range Status  08/05/2022 23 5 - 24 mg/dL Final   Total Protein  Date Value Ref Range Status  09/13/2023 6.5 6.1 - 8.1 g/dL Final  11/91/4782 7.4 6.0 - 8.5 g/dL Final   GFR, Est African American  Date Value Ref Range Status  08/11/2020 56 (L) > OR = 60 mL/min/1.44m2 Final   eGFR  Date Value Ref Range Status  01/28/2023 50 (L) > OR = 60 mL/min/1.75m2 Final  12/16/2020 50 (L) >59 mL/min/1.73 Final   GFR, Est Non African American  Date Value Ref Range Status  08/11/2020 48 (L) > OR = 60 mL/min/1.107m2 Final   GFR, Estimated  Date Value Ref Range Status  02/12/2023 43 (L) >60 mL/min Final    Comment:    (NOTE) Calculated using the CKD-EPI Creatinine Equation (2021)           metoprolol succinate (TOPROL-XL) 25 MG 24 hr tablet [Pharmacy Med Name: METOPROLOL SUCCINATE ER 25MG  ER TABLET ER 24HR] 30 tablet 10    Sig: TAKE 1 TABLET BY MOUTH ONCE A DAY     Cardiovascular:  Beta Blockers Failed - 09/27/2023 11:16 AM      Failed - Valid encounter within last 6 months     Recent Outpatient Visits           2 weeks ago Type 2 diabetes mellitus with hyperlipidemia (HCC)   [place] [facility] Medicine Donita Brooks, MD   8 months ago Type 2 diabetes mellitus with hyperlipidemia [facility])   [place] [facility] Family Medicine Donita Brooks, MD   1 year ago Encounter for screening mammogram for malignant neoplasm of breast   [place] [facility] Medicine Donita Brooks, MD   1 year ago Type 2 diabetes mellitus with hyperlipidemia [facility])   [place] [facility] Pickard, Priscille Heidelberg, MD              Passed - Last BP in normal range    BP Readings from Last 1 Encounters:  09/13/23 130/76         Passed - Last Heart Rate in normal range    Pulse Readings from Last 1 Encounters:  09/13/23 93          amLODipine (NORVASC) 5 MG tablet [Pharmacy Med Name: AMLODIPINE BESYLATE 5MG  TABLET] 30 tablet 10    Sig: TAKE 1 TABLET BY MOUTH ONCE A DAY     Cardiovascular:  Calcium Channel Blockers 2 Failed - 09/27/2023 11:16 AM      Failed - Valid encounter within last 6 months    Recent Outpatient Visits           2 weeks ago Type 2 diabetes mellitus with hyperlipidemia [facility])   [place] [facility] Medicine Donita Brooks, MD   8 months ago Type 2 diabetes mellitus with hyperlipidemia [facility])   [place] [facility] Medicine Pickard, Priscille Heidelberg, MD   1 year ago Encounter for screening mammogram for malignant neoplasm of breast   [place] [facility] Donita Brooks, MD   1 year ago Type 2 diabetes mellitus with hyperlipidemia [facility])    [place] Medicine Pickard, Priscille Heidelberg, MD              Passed - Last BP in normal range    BP Readings from Last 1 Encounters:  09/13/23 130/76         Passed - Last Heart Rate in normal range    Pulse Readings from Last 1 Encounters:  09/13/23 93

## 2023-09-27 NOTE — Progress Notes (Signed)
 Subjective:    Patient ID: Kathryn Wall, female    DOB: [birth year], 78 y.o.   MRN: [id number]  At recent office visit, the patient's blood sugar was greater than 600 and her hemoglobin A1c was greater than 10.  We recommended starting insulin.  The patient started at 15 units a day and has increased her insulin by 1 unit every day.  She is checking her fasting blood sugars in the morning.  They have slowly trended down from 450-310.  She is currently on 22 units a day Past Medical History:  Diagnosis Date   Anxiety    Breast cancer (HCC) 06/29/1995   AGE 47, BRCA 1 NEGATIVE 2. 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    CKD (chronic kidney disease), stage III (HCC)    Cognitive deficits    mild   Depression    Diabetes mellitus    TYPE II   High cholesterol    Hypertension    PAF (paroxysmal atrial fibrillation) [facility])    Past Surgical History:  Procedure Laterality Date   ABDOMINAL HYSTERECTOMY  06/29/1983   TAH   ANKLE SURGERY Right    APPLICATION OF WOUND VAC Left 01/14/2022   Procedure: APPLICATION OF WOUND VAC;  Surgeon: Peggye Form, DO;  Location: [facility] OR;  Service: Plastics;  Laterality: Left;   BREAST SURGERY  06/29/1995   RIGHT BREAST LUMPECTOMY   CHOLECYSTECTOMY  06/28/1978   COLONOSCOPY     Current Outpatient Medications on File Prior to Visit  Medication Sig Dispense Refill   acetaminophen (TYLENOL) 500 MG tablet Take 1,000 mg by mouth daily as needed for moderate pain, fever or headache.     amLODipine (NORVASC) 5 MG tablet TAKE 1 TABLET BY MOUTH ONCE A DAY 30 tablet 10   atorvastatin (LIPITOR) 40 MG tablet TAKE ONE TABLET BY MOUTH ONCE A DAY 90 tablet 2   Insulin Pen Needle 31G X 8 MM MISC USE TO ADMINISTER INSULIN DAILY. 100 each 3   JARDIANCE 25 MG TABS tablet TAKE ONE TABLET BY MOUTH ONCE A DAY 90 tablet 0   losartan (COZAAR) 50 MG tablet TAKE ONE TABLET BY MOUTH ONCE A DAY 100 tablet 0   metFORMIN (GLUCOPHAGE-XR) 500 MG 24 hr tablet  TAKE FOUR TABLETS BY MOUTH ONCE DAILY WITH BREAKFAST. 360 tablet 1   metoprolol succinate (TOPROL-XL) 25 MG 24 hr tablet TAKE 1 TABLET BY MOUTH ONCE A DAY 30 tablet 10   mirabegron ER (MYRBETRIQ) 25 MG TB24 tablet TAKE ONE TABLET BY MOUTH ONCE A DAY 90 tablet 1   Multiple Vitamins-Minerals (ONE A DAY WOMEN 50 PLUS) CHEW Chew 1 each by mouth daily.     ondansetron (ZOFRAN-ODT) 4 MG disintegrating tablet Take 4 mg by mouth every 8 (eight) hours as needed for nausea or vomiting.     ondansetron (ZOFRAN-ODT) 4 MG disintegrating tablet Take 1 tablet (4 mg total) by mouth every 8 (eight) hours as needed for nausea or vomiting. 12 tablet 0   pantoprazole (PROTONIX) 40 MG tablet TAKE ONE TABLET BY MOUTH EVERY MORNING (Patient taking differently: Take 40 mg by mouth daily.) 90 tablet 2   QUEtiapine (SEROQUEL) 50 MG tablet TAKE ONE TABLET BY MOUTH ONCE A DAY 30 tablet 0   traZODone (DESYREL) 50 MG tablet TAKE ONE TABLET BY MOUTH EVERY NIGHT AT BEDTIME 90 tablet 1   venlafaxine XR (EFFEXOR-XR) 75 MG 24 hr capsule TAKE ONE CAPSULE BY MOUTH ONCE DAILY WITH BREAKFAST (Patient taking  differently: Take 75 mg by mouth daily with breakfast.) 90 capsule 2   apixaban (ELIQUIS) 5 MG TABS tablet TAKE ONE TABLET BY MOUTH TWICE A DAY (Patient not taking: Reported on 09/27/2023) 180 tablet 2   No current facility-administered medications on file prior to visit.   Allergies  Allergen Reactions   Ambien [Zolpidem Tartrate] Other (See Comments)    Sleep walking   Cipro [Ciprofloxacin Hcl] Nausea Only   Cymbalta [Duloxetine Hcl] Other (See Comments)    Unknown Reaction   Other     SENSITIVE TO ANTIBIOTICS   Cardura [Doxazosin] Nausea And Vomiting   Percocet [Oxycodone-Acetaminophen] Nausea And Vomiting   Social History   Socioeconomic History   Marital status: Married    Spouse name: Not on file   Number of children: Not on file   Years of education: Not on file   Highest education level: Not on file   Occupational History   Not on file  Tobacco Use   Smoking status: Never   Smokeless tobacco: Never  Vaping Use   Vaping status: Never Used  Substance and Sexual Activity   Alcohol use: No   Drug use: No   Sexual activity: Yes    Birth control/protection: Post-menopausal, Surgical    Comment: hysterectomy  Other Topics Concern   Not on file  Social History Narrative   Not on file   Social Drivers of Health   Financial Resource Strain: Low Risk  (02/10/2023)   Overall Financial Resource Strain (CARDIA)    Difficulty of Paying Living Expenses: Not hard at all  Food Insecurity: No Food Insecurity (02/10/2023)   Hunger Vital Sign    Worried About Running Out of Food in the Last Year: Never true    Ran Out of Food in the Last Year: Never true  Transportation Needs: No Transportation Needs (02/10/2023)   PRAPARE - Administrator, Civil Service (Medical): No    Lack of Transportation (Non-Medical): No  Physical Activity: Insufficiently Active (02/10/2023)   Exercise Vital Sign    Days of Exercise per Week: 3 days    Minutes of Exercise per Session: 30 min  Stress: No Stress Concern Present (02/10/2023)   [employer] of Occupational Health - Occupational Stress Questionnaire    Feeling of Stress : Not at all  Social Connections: Moderately Integrated (02/10/2023)   Social Connection and Isolation Panel [NHANES]    Frequency of Communication with Friends and Family: More than three times a week    Frequency of Social Gatherings with Friends and Family: Three times a week    Attends Religious Services: 1 to 4 times per year    Active Member of Clubs or Organizations: No    Attends Banker Meetings: Never    Marital Status: Married  Catering manager Violence: Not At Risk (02/10/2023)   Humiliation, Afraid, Rape, and Kick questionnaire    Fear of Current or Ex-Partner: No    Emotionally Abused: No    Physically Abused: No    Sexually Abused: No    Family History  Problem Relation Age of Onset   Cancer Mother        COLON   Hypertension Father    Heart disease Father       Review of Systems  All other systems reviewed and are negative.      Objective:   Physical Exam Vitals reviewed.  Constitutional:      General: She is not in acute distress.  Appearance: She is well-developed. She is not diaphoretic.  HENT:     Head: Normocephalic and atraumatic.     Right Ear: External ear normal.     Left Ear: External ear normal.     Nose: Nose normal.     Mouth/Throat:     Pharynx: No oropharyngeal exudate.  Eyes:     General: No scleral icterus.       Right eye: No discharge.        Left eye: No discharge.     Conjunctiva/sclera: Conjunctivae normal.     Pupils: Pupils are equal, round, and reactive to light.  Neck:     Thyroid: No thyromegaly.     Trachea: No tracheal deviation.  Cardiovascular:     Rate and Rhythm: Normal rate and regular rhythm.     Heart sounds: Normal heart sounds. No murmur heard.    No friction rub. No gallop.  Pulmonary:     Effort: Pulmonary effort is normal. No respiratory distress.     Breath sounds: Normal breath sounds. No stridor. No wheezing or rales.  Abdominal:     General: Bowel sounds are normal. There is no distension.     Palpations: Abdomen is soft. There is no mass.     Tenderness: There is no abdominal tenderness. There is no guarding or rebound.  Musculoskeletal:     Cervical back: Normal range of motion and neck supple.  Lymphadenopathy:     Cervical: No cervical adenopathy.  Skin:    General: Skin is warm.     Coloration: Skin is not pale.     Findings: No erythema or rash.  Neurological:     Mental Status: She is alert and oriented to person, place, and time.     Cranial Nerves: No cranial nerve deficit.     Motor: No abnormal muscle tone.     Coordination: Coordination normal.     Deep Tendon Reflexes: Reflexes normal.  Psychiatric:        Mood and Affect:  Mood normal.        Speech: Speech normal.        Behavior: Behavior normal.        Thought Content: Thought content normal.        Judgment: Judgment normal.           Assessment & Plan:  Type 2 diabetes mellitus with stage 3a chronic kidney disease, without long-term current use of insulin (HCC) - Plan: insulin glargine (LANTUS SOLOSTAR) 100 UNIT/ML Solostar Pen  Type 2 diabetes mellitus with hyperlipidemia (HCC) - Plan: insulin glargine (LANTUS SOLOSTAR) 100 UNIT/ML Solostar Pen Increase Lantus to 32 units a day and continue to uptitrate 1 unit a day until fasting blood sugars are under 150.  I anticipate that she will need 45 to 50 units of insulin to achieve this.  Will reassess in 1 week and make an adjustment again at that time.  Start continuous blood glucose monitoring.  Patient was given supplies to do so

## 2023-09-29 ENCOUNTER — Telehealth: Payer: Self-pay

## 2023-09-29 NOTE — Telephone Encounter (Signed)
 Copied from CRM [id number]. Topic: Clinical - Prescription Issue >> Sep 29, 2023  9:19 AM Shon Hale wrote: Reason for CRM: Bridgette with [facility] calling to confirm signature received for continuous glucose monitoring is Dr. Caren Macadam.  Received notes from 09/13/2023 but do not see anything where patient is on insulin/needing insulin. Needing notes where insulin is mentioned and patient's diabetes. Requesting addendum with notes from patient's visit on 09/27/2023.   Best callback number: [phone] Fax: [phone] >> Sep 29, 2023  9:31 AM Bobbye Morton wrote: Hughie Closs is calling in to see If the Dr. Evaristo Bury add CGM notation to September 27 2023 or can do addendum   Bridgette  callback number: [phone]

## 2023-09-30 ENCOUNTER — Telehealth: Payer: Self-pay

## 2023-09-30 NOTE — Telephone Encounter (Signed)
 Copied from CRM [id number]. Topic: Clinical - Medication Question >> Sep 30, 2023  2:01 PM Shelah Lewandowsky wrote: Reason for CRM: Bridgette with [facility] looking for a fax- need the date changed on the written order for cmn for Continuous Glucose Monitoring from 09/23/23 to 09/27/2023 and initial and date it- or have doctor redo and sign it in pen fax to [phone] and phone [phone] to confirm

## 2023-10-04 ENCOUNTER — Telehealth: Payer: Self-pay

## 2023-10-04 NOTE — Telephone Encounter (Signed)
 Copied from CRM [id number]. Topic: General - Other >> Sep 30, 2023  2:21 PM Yolanda T wrote: Reason for CRM: Lanora Manis from [facility] called stated they recvd a signed written order on 3/31 but need to verify if that is the physicians signature signed with a pen. Also the date was 09/23/23, need updated to 09/27/2023, please initial and date the correction. Lanora Manis is requesting a call back at [phone] >> Oct 04, 2023 10:21 AM Truddie Crumble wrote: Hughie Closs with [facility] called wanting to if we received cmn order for a glucose monitor that was faxed on 4/4 and 4/7 if so she would like the doctor to sign either one. Bridgett want the 3/28 order to be changed to 09/27/23 CB [phone] >> Sep 30, 2023  3:19 PM Nurse Gerald Leitz wrote: Addressed in original TE. Mjp,lpn

## 2023-10-05 ENCOUNTER — Ambulatory Visit: Payer: Self-pay

## 2023-10-05 ENCOUNTER — Telehealth: Payer: Self-pay

## 2023-10-05 NOTE — Telephone Encounter (Signed)
 Copied from CRM [id number]. Topic: General - Call Back - No Documentation >> Oct 05, 2023  9:36 AM Gery Pray wrote: Reason for CRM: Bridgette from [facility] calling to speak to a nurse transferred call to CAL >> Oct 05, 2023  9:53 AM Izetta Dakin wrote: Hughie Closs with [facility] requesting patient demographic face sheet be faxed.  Fax# [phone] Callback # [phone]

## 2023-10-05 NOTE — Telephone Encounter (Signed)
 Copied from CRM [id number]. Topic: Clinical - Medical Advice >> Oct 05, 2023  2:30 PM Thliyah D wrote: Patient's blood sugar up to 350 last night today 199. She wants to know if she should take more insulin to get it stabilized.   Chief Complaint: High blood sugar  Symptoms: No symptoms at this time  Frequency: Frequent  Pertinent Negatives: Patient denies any symptoms  Disposition: [] ED /[] Urgent Care (no appt availability in office) / [] Appointment(In office/virtual)/ []  [facility]/ [] Home Care/ [] Refused Recommended Disposition /[] [place] [facility]/ [x]  Follow-up with PCP Additional Notes: Patient reports she was seen last week for her diabetes and had her insulin changed. She states that her insulin is now up to 34 units daily and states she is still having blood sugar problem. She states her blood sugar this morning was 199 and that it is currently 350. She denies any symptoms and wanted to let Dr. Tanya Nones know as instructed during her last visit. Patient does not want to make an appointment at this time. Patient instructed to call back for new or worsening symptoms. Patient verbalized understanding and agreement with this plan.     Reason for Disposition  [1] Blood glucose > 300 mg/dL (04.5 mmol/L) AND [4] two or more times in a row  Answer Assessment - Initial Assessment Questions 1. BLOOD GLUCOSE: "What is your blood glucose level?"      350 2. ONSET: "When did you check the blood glucose?"     Within the last hour  3. USUAL RANGE: "What is your glucose level usually?" (e.g., usual fasting morning value, usual evening value)     Has been running high  4. KETONES: "Do you check for ketones (urine or blood test strips)?" If Yes, ask: "What does the test show now?"      No 5. TYPE 1 or 2:  "Do you know what type of diabetes you have?"  (e.g., Type 1, Type 2, Gestational; doesn't know)      Type 2 diabetes  6. INSULIN: "Do you take insulin?" "What type of insulin(s) do  you use? What is the mode of delivery? (syringe, pen; injection or pump)?"      Yes 7. DIABETES PILLS: "Do you take any pills for your diabetes?" If Yes, ask: "Have you missed taking any pills recently?"     No 8. OTHER SYMPTOMS: "Do you have any symptoms?" (e.g., fever, frequent urination, difficulty breathing, dizziness, weakness, vomiting)     No 9. PREGNANCY: "Is there any chance you are pregnant?" "When was your last menstrual period?"     No  Protocols used: Diabetes - High Blood Sugar-A-AH

## 2023-10-05 NOTE — Telephone Encounter (Signed)
 This RN made third and final attempt to triage patient. No answer, left a message. Routing to office for follow-up.

## 2023-10-05 NOTE — Telephone Encounter (Signed)
 This RN made second attempt to triage patient. No answer, unable to leave a message. Will route for additional attempts.

## 2023-10-05 NOTE — Telephone Encounter (Signed)
1st attempt left VM

## 2023-10-05 NOTE — Telephone Encounter (Signed)
 Copied from CRM [id number]. Topic: Clinical - Medical Advice >> Oct 05, 2023  2:30 PM Kathryn Wall wrote: Patient's blood sugar up to 350 last night today 199. She wants to know if she should take more insulin to get it stabilized.

## 2023-10-06 ENCOUNTER — Telehealth: Payer: Self-pay

## 2023-10-06 NOTE — Telephone Encounter (Signed)
 Copied from CRM [id number]. Topic: Clinical - Prescription Issue >> Oct 06, 2023 11:03 AM Elle L wrote: Reason for CRM: [facility] with [facility], [phone] called on behalf of the patient as she has been attempting to reach the patient regarding her blood sugar monitor but the patient believes it is a scam call. She is requesting to see if the office could reach out to the patient to let her know to reach out to them.

## 2023-10-07 ENCOUNTER — Encounter: Payer: Self-pay | Admitting: Family Medicine

## 2023-10-25 ENCOUNTER — Other Ambulatory Visit: Payer: Self-pay | Admitting: Family Medicine

## 2023-10-25 NOTE — Telephone Encounter (Signed)
 Requesting refill Last OV 09/27/23 Next OV 02/16/24  Please approve or deny. Thank you

## 2023-11-01 ENCOUNTER — Ambulatory Visit

## 2023-11-18 ENCOUNTER — Other Ambulatory Visit: Payer: Self-pay | Admitting: Family Medicine

## 2023-11-18 DIAGNOSIS — K219 Gastro-esophageal reflux disease without esophagitis: Secondary | ICD-10-CM

## 2023-11-22 NOTE — Telephone Encounter (Signed)
 Requested medication (s) are due for refill today -yes  Requested medication (s) are on the active medication list -yes  Future visit scheduled -no  Last refill: Quetiapine - 10/25/23 #30- non delegated Rx                 Atorvastatin - 11/02/22 #90 2RF- fails lab protocol- over 1 year-08/05/22  Notes to clinic: see above   Requested Prescriptions  Pending Prescriptions Disp Refills   QUEtiapine  (SEROQUEL ) 50 MG tablet [Pharmacy Med Name: QUETIAPINE  FUMARATE 50MG  TABLET] 30 tablet 0    Sig: TAKE ONE TABLET BY MOUTH ONCE A DAY     Not Delegated - Psychiatry:  Antipsychotics - Second Generation (Atypical) - quetiapine  Failed - 11/22/2023  4:27 PM      Failed - This refill cannot be delegated      Failed - TSH in normal range and within 360 days    TSH  Date Value Ref Range Status  12/15/2021 2.10 0.40 - 4.50 mIU/L Final         Failed - Valid encounter within last 6 months    Recent Outpatient Visits           1 month ago Type 2 diabetes mellitus with stage 3a chronic kidney disease, without long-term current use of insulin  (HCC)   [place] [facility] Austine Lefort, MD   2 months ago Type 2 diabetes mellitus with hyperlipidemia [facility])   [place] [facility] Austine Lefort, MD   9 months ago Type 2 diabetes mellitus with hyperlipidemia [facility])   [facility] [facility] Pickard, Cisco Crest, MD   1 year ago Encounter for screening mammogram for malignant neoplasm of breast   [place] [facility] Austine Lefort, MD   1 year ago Type 2 diabetes mellitus with hyperlipidemia [facility])    [facility] Austine Lefort, MD              Failed - Lipid Panel in normal range within the last 12 months    Cholesterol  Date Value Ref Range Status  08/05/2022 142 <200 mg/dL Final   LDL Cholesterol (Calc)  Date Value Ref Range Status  08/05/2022 64 mg/dL (calc) Final    Comment:     Reference range: <100 . Desirable range <100 mg/dL for primary prevention;   <70 mg/dL for patients with CHD or diabetic patients  with > or = 2 CHD risk factors. Aaron Aas LDL-C is now calculated using the Martin-Hopkins  calculation, which is a validated novel method providing  better accuracy than the Friedewald equation in the  estimation of LDL-C.  Melinda Sprawls et al. Erroll Heard. 7106;269(48): 2061-2068  (http://education.[facility].com/faq/FAQ164)    Direct LDL  Date Value Ref Range Status  11/22/2019 62 <100 mg/dL Final    Comment:    Greatly elevated Triglycerides values (>1200 mg/dL) interfere with the dLDL assay. As no Triglycerides  testing was ordered, interpret results with caution. . Desirable range <100 mg/dL for primary prevention;   <70 mg/dL for patients with CHD or diabetic patients  with > or = 2 CHD risk factors. Aaron Aas    HDL  Date Value Ref Range Status  08/05/2022 50 > OR = 50 mg/dL Final   Triglycerides  Date Value Ref Range Status  08/05/2022 228 (H) <150 mg/dL Final    Comment:    . If a non-fasting specimen was collected, consider repeat triglyceride testing on a  fasting specimen if clinically indicated.  Imagene Mam et al. J. of Clin. Lipidol. 2015;9:129-169. Aaron Aas          Passed - Last BP in normal range    BP Readings from Last 1 Encounters:  09/27/23 124/72         Passed - Last Heart Rate in normal range    Pulse Readings from Last 1 Encounters:  09/27/23 78         Passed - CBC within normal limits and completed in the last 12 months    WBC  Date Value Ref Range Status  09/13/2023 8.0 3.8 - 10.8 Thousand/uL Final   RBC  Date Value Ref Range Status  09/13/2023 4.49 3.80 - 5.10 Million/uL Final   Hemoglobin  Date Value Ref Range Status  09/13/2023 13.6 11.7 - 15.5 g/dL Final  09/81/1914 78.2 11.1 - 15.9 g/dL Final   HCT  Date Value Ref Range Status  09/13/2023 42.9 35.0 - 45.0 % Final   Hematocrit  Date Value Ref Range Status   12/16/2020 36.5 34.0 - 46.6 % Final   MCHC  Date Value Ref Range Status  09/13/2023 31.7 (L) 32.0 - 36.0 g/dL Final    Comment:    For adults, a slight decrease in the calculated MCHC value (in the range of 30 to 32 g/dL) is most likely not clinically significant; however, it should be interpreted with caution in correlation with other red cell parameters and the patient's clinical condition.    [facility]  Date Value Ref Range Status  09/13/2023 30.3 27.0 - 33.0 pg Final   MCV  Date Value Ref Range Status  09/13/2023 95.5 80.0 - 100.0 fL Final  12/16/2020 91 79 - 97 fL Final   No results found for: "PLTCOUNTKUC", "LABPLAT", "POCPLA" RDW  Date Value Ref Range Status  09/13/2023 13.1 11.0 - 15.0 % Final  12/16/2020 13.7 11.7 - 15.4 % Final         Passed - CMP within normal limits and completed in the last 12 months    Albumin  Date Value Ref Range Status  02/12/2023 3.0 (L) 3.5 - 5.0 g/dL Final  95/62/1308 4.5 3.7 - 4.7 g/dL Final   Alkaline Phosphatase  Date Value Ref Range Status  02/12/2023 60 38 - 126 U/L Final   Alkaline phosphatase (APISO)  Date Value Ref Range Status  09/13/2023 100 37 - 153 U/L Final   ALT  Date Value Ref Range Status  09/13/2023 19 6 - 29 U/L Final   AST  Date Value Ref Range Status  09/13/2023 19 10 - 35 U/L Final   BUN  Date Value Ref Range Status  09/13/2023 21 7 - 25 mg/dL Final  65/78/4696 17 8 - 27 mg/dL Final   Calcium   Date Value Ref Range Status  09/13/2023 8.7 8.6 - 10.4 mg/dL Final   Calcium , Ion  Date Value Ref Range Status  02/12/2023 1.17 1.15 - 1.40 mmol/L Final   CO2  Date Value Ref Range Status  09/13/2023 20 20 - 32 mmol/L Final   TCO2  Date Value Ref Range Status  02/12/2023 19 (L) 22 - 32 mmol/L Final   Creat  Date Value Ref Range Status  09/13/2023 1.33 (H) 0.60 - 1.00 mg/dL Final   Creatinine, Urine  Date Value Ref Range Status  09/13/2023 23 20 - 275 mg/dL Final   Glucose, Bld  Date Value  Ref Range Status  09/13/2023 650 (HH) 65 - 99 mg/dL Final  Comment:    Verified by repeat analysis. Aaron Aas .            Fasting reference interval . For someone without known diabetes, a glucose value >125 mg/dL indicates that they may have diabetes and this should be confirmed with a follow-up test. .    Glucose-Capillary  Date Value Ref Range Status  12/30/2022 109 (H) 70 - 99 mg/dL Final    Comment:    Glucose reference range applies only to samples taken after fasting for at least 8 hours.   Potassium  Date Value Ref Range Status  09/13/2023 5.4 (H) 3.5 - 5.3 mmol/L Final   Sodium  Date Value Ref Range Status  09/13/2023 127 (L) 135 - 146 mmol/L Final  12/16/2020 143 134 - 144 mmol/L Final   Total Bilirubin  Date Value Ref Range Status  09/13/2023 0.5 0.2 - 1.2 mg/dL Final   Bilirubin Total  Date Value Ref Range Status  12/16/2020 0.3 0.0 - 1.2 mg/dL Final   Bilirubin, Direct  Date Value Ref Range Status  06/18/2016 0.1 <=0.2 mg/dL Final   Indirect Bilirubin  Date Value Ref Range Status  06/18/2016 0.4 0.2 - 1.2 mg/dL Final   Protein, ur  Date Value Ref Range Status  02/12/2023 NEGATIVE NEGATIVE mg/dL Final   Total Protein, Urine  Date Value Ref Range Status  08/05/2022 23 5 - 24 mg/dL Final   Total Protein  Date Value Ref Range Status  09/13/2023 6.5 6.1 - 8.1 g/dL Final  84/13/2440 7.4 6.0 - 8.5 g/dL Final   GFR, Est African American  Date Value Ref Range Status  08/11/2020 56 (L) > OR = 60 mL/min/1.61m2 Final   eGFR  Date Value Ref Range Status  01/28/2023 50 (L) > OR = 60 mL/min/1.6m2 Final  12/16/2020 50 (L) >59 mL/min/1.73 Final   GFR, Est Non African American  Date Value Ref Range Status  08/11/2020 48 (L) > OR = 60 mL/min/1.52m2 Final   GFR, Estimated  Date Value Ref Range Status  02/12/2023 43 (L) >60 mL/min Final    Comment:    (NOTE) Calculated using the CKD-EPI Creatinine Equation (2021)           atorvastatin  (LIPITOR)  40 MG tablet [Pharmacy Med Name: ATORVASTATIN  CALCIUM  40MG  TABLET] 90 tablet 2    Sig: TAKE ONE TABLET BY MOUTH ONCE A DAY     Cardiovascular:  Antilipid - Statins Failed - 11/22/2023  4:27 PM      Failed - Lipid Panel in normal range within the last 12 months    Cholesterol  Date Value Ref Range Status  08/05/2022 142 <200 mg/dL Final   LDL Cholesterol (Calc)  Date Value Ref Range Status  08/05/2022 64 mg/dL (calc) Final    Comment:    Reference range: <100 . Desirable range <100 mg/dL for primary prevention;   <70 mg/dL for patients with CHD or diabetic patients  with > or = 2 CHD risk factors. Aaron Aas LDL-C is now calculated using the Martin-Hopkins  calculation, which is a validated novel method providing  better accuracy than the Friedewald equation in the  estimation of LDL-C.  Melinda Sprawls et al. Erroll Heard. 1027;253(66): 2061-2068  (http://education.[facility].com/faq/FAQ164)    Direct LDL  Date Value Ref Range Status  11/22/2019 62 <100 mg/dL Final    Comment:    Greatly elevated Triglycerides values (>1200 mg/dL) interfere with the dLDL assay. As no Triglycerides  testing was ordered, interpret results with caution. . Desirable range <  100 mg/dL for primary prevention;   <70 mg/dL for patients with CHD or diabetic patients  with > or = 2 CHD risk factors. Aaron Aas    HDL  Date Value Ref Range Status  08/05/2022 50 > OR = 50 mg/dL Final   Triglycerides  Date Value Ref Range Status  08/05/2022 228 (H) <150 mg/dL Final    Comment:    . If a non-fasting specimen was collected, consider repeat triglyceride testing on a fasting specimen if clinically indicated.  Imagene Mam et al. J. of Clin. Lipidol. 2015;9:129-169. Aaron Aas          Passed - Patient is not pregnant      Passed - Valid encounter within last 12 months    Recent Outpatient Visits           1 month ago Type 2 diabetes mellitus with stage 3a chronic kidney disease, without long-term current use of insulin  (HCC)    [place] [facility] Austine Lefort, MD   2 months ago Type 2 diabetes mellitus with hyperlipidemia [facility])   [place] [facility] Medicine Austine Lefort, MD   9 months ago Type 2 diabetes mellitus with hyperlipidemia [facility])   [place] [facility] Pickard, Cisco Crest, MD   1 year ago Encounter for screening mammogram for malignant neoplasm of breast   [place] [facility] Medicine Austine Lefort, MD   1 year ago Type 2 diabetes mellitus with hyperlipidemia [facility])   [place] [facility] Medicine Pickard, Cisco Crest, MD              Signed Prescriptions Disp Refills   Continuous Glucose Sensor (DEXCOM G7 SENSOR) MISC 3 each 1    Sig: CHANGE SENSOR EVERY TEN DAYS.     Endocrinology: Diabetes - Testing Supplies Passed - 11/22/2023  4:27 PM      Passed - Valid encounter within last 12 months    Recent Outpatient Visits           1 month ago Type 2 diabetes mellitus with stage 3a chronic kidney disease, without long-term current use of insulin  [facility])   [place] [facility] Family Medicine Austine Lefort, MD   2 months ago Type 2 diabetes mellitus with hyperlipidemia [facility])   [place] [facility] Medicine Austine Lefort, MD   9 months ago Type 2 diabetes mellitus with hyperlipidemia [facility])   [place] [facility] Medicine Pickard, Cisco Crest, MD   1 year ago Encounter for screening mammogram for malignant neoplasm of breast   [place] [facility] Medicine Austine Lefort, MD   1 year ago Type 2 diabetes mellitus with hyperlipidemia [facility])   [place] [facility] Medicine Pickard, Cisco Crest, MD               traZODone  (DESYREL ) 50 MG tablet 90 tablet 1    Sig: TAKE ONE TABLET BY MOUTH EVERY NIGHT AT BEDTIME     Psychiatry: Antidepressants - Serotonin Modulator Failed - 11/22/2023  4:27 PM      Failed - Valid encounter within last 6 months    Recent  Outpatient Visits           1 month ago Type 2 diabetes mellitus with stage 3a chronic kidney disease, without long-term current use of insulin  [facility])    [facility] Pickard, Cisco Crest, MD   2 months ago  Type 2 diabetes mellitus with hyperlipidemia [facility])   [place] [facility] Pickard, Cisco Crest, MD   9 months ago Type 2 diabetes mellitus with hyperlipidemia [facility])   [place] [facility] Family Medicine Pickard, Cisco Crest, MD   1 year ago Encounter for screening mammogram for malignant neoplasm of breast   [place] [facility] Austine Lefort, MD   1 year ago Type 2 diabetes mellitus with hyperlipidemia [facility])   [place] [facility] Medicine Pickard, Cisco Crest, MD               MYRBETRIQ  25 MG TB24 tablet 90 tablet 1    Sig: TAKE ONE TABLET BY MOUTH ONCE A DAY     Urology: Bladder Agents - mirabegron  Failed - 11/22/2023  4:27 PM      Failed - Cr in normal range and within 360 days    Creat  Date Value Ref Range Status  09/13/2023 1.33 (H) 0.60 - 1.00 mg/dL Final   Creatinine, Urine  Date Value Ref Range Status  09/13/2023 23 20 - 275 mg/dL Final         Passed - ALT in normal range and within 360 days    ALT  Date Value Ref Range Status  09/13/2023 19 6 - 29 U/L Final         Passed - AST in normal range and within 360 days    AST  Date Value Ref Range Status  09/13/2023 19 10 - 35 U/L Final         Passed - eGFR is 15 or above and within 360 days    GFR, Est African American  Date Value Ref Range Status  08/11/2020 56 (L) > OR = 60 mL/min/1.47m2 Final   GFR, Est Non African American  Date Value Ref Range Status  08/11/2020 48 (L) > OR = 60 mL/min/1.41m2 Final   GFR, Estimated  Date Value Ref Range Status  02/12/2023 43 (L) >60 mL/min Final    Comment:    (NOTE) Calculated using the CKD-EPI Creatinine Equation (2021)    eGFR  Date Value Ref Range Status  01/28/2023 50 (L) >  OR = 60 mL/min/1.57m2 Final  12/16/2020 50 (L) >59 mL/min/1.73 Final         Passed - Last BP in normal range    BP Readings from Last 1 Encounters:  09/27/23 124/72         Passed - Valid encounter within last 12 months    Recent Outpatient Visits           1 month ago Type 2 diabetes mellitus with stage 3a chronic kidney disease, without long-term current use of insulin  (HCC)   [place] [facility] Medicine Austine Lefort, MD   2 months ago Type 2 diabetes mellitus with hyperlipidemia [facility])   [place] [facility] Medicine Austine Lefort, MD   9 months ago Type 2 diabetes mellitus with hyperlipidemia [facility])   [place] [facility] Family Medicine Austine Lefort, MD   1 year ago Encounter for screening mammogram for malignant neoplasm of breast   [place] [facility] Family Medicine Austine Lefort, MD   1 year ago Type 2 diabetes mellitus with hyperlipidemia [facility])    [facility] Austine Lefort, MD               pantoprazole  (PROTONIX ) 40 MG tablet 90  tablet 2    Sig: TAKE ONE TABLET BY MOUTH EVERY MORNING     Gastroenterology: Proton Pump Inhibitors Passed - 11/22/2023  4:27 PM      Passed - Valid encounter within last 12 months    Recent Outpatient Visits           1 month ago Type 2 diabetes mellitus with stage 3a chronic kidney disease, without long-term current use of insulin  (HCC)   [place] [facility] Medicine Austine Lefort, MD   2 months ago Type 2 diabetes mellitus with hyperlipidemia [facility])   [place] [facility] Austine Lefort, MD   9 months ago Type 2 diabetes mellitus with hyperlipidemia [facility])   [place] [facility] Medicine Pickard, Cisco Crest, MD   1 year ago Encounter for screening mammogram for malignant neoplasm of breast   [place] [facility] Medicine Austine Lefort, MD   1 year ago Type 2 diabetes mellitus with  hyperlipidemia [facility])   [place] [facility] Medicine Austine Lefort, MD                 Requested Prescriptions  Pending Prescriptions Disp Refills   QUEtiapine  (SEROQUEL ) 50 MG tablet [Pharmacy Med Name: QUETIAPINE  FUMARATE 50MG  TABLET] 30 tablet 0    Sig: TAKE ONE TABLET BY MOUTH ONCE A DAY     Not Delegated - Psychiatry:  Antipsychotics - Second Generation (Atypical) - quetiapine  Failed - 11/22/2023  4:27 PM      Failed - This refill cannot be delegated      Failed - TSH in normal range and within 360 days    TSH  Date Value Ref Range Status  12/15/2021 2.10 0.40 - 4.50 mIU/L Final         Failed - Valid encounter within last 6 months    Recent Outpatient Visits           1 month ago Type 2 diabetes mellitus with stage 3a chronic kidney disease, without long-term current use of insulin  (HCC)   [place] [facility] Medicine Austine Lefort, MD   2 months ago Type 2 diabetes mellitus with hyperlipidemia [facility])   [place] [facility] Medicine Austine Lefort, MD   9 months ago Type 2 diabetes mellitus with hyperlipidemia [facility])   [place] [facility] Medicine Pickard, Cisco Crest, MD   1 year ago Encounter for screening mammogram for malignant neoplasm of breast   [place] [facility] Medicine Austine Lefort, MD   1 year ago Type 2 diabetes mellitus with hyperlipidemia [facility])   [place] [facility] Family Medicine Austine Lefort, MD              Failed - Lipid Panel in normal range within the last 12 months    Cholesterol  Date Value Ref Range Status  08/05/2022 142 <200 mg/dL Final   LDL Cholesterol (Calc)  Date Value Ref Range Status  08/05/2022 64 mg/dL (calc) Final    Comment:    Reference range: <100 . Desirable range <100 mg/dL for primary prevention;   <70 mg/dL for patients with CHD or diabetic patients  with > or = 2 CHD risk factors. Aaron Aas LDL-C is now calculated using the  Martin-Hopkins  calculation, which is a validated novel method providing  better accuracy than the Friedewald equation in the  estimation of LDL-C.  Melinda Sprawls et al.  JAMA. 3151;761(60): [phone]  (http://education.[facility].com/faq/FAQ164)    Direct LDL  Date Value Ref Range Status  11/22/2019 62 <100 mg/dL Final    Comment:    Greatly elevated Triglycerides values (>1200 mg/dL) interfere with the dLDL assay. As no Triglycerides  testing was ordered, interpret results with caution. . Desirable range <100 mg/dL for primary prevention;   <70 mg/dL for patients with CHD or diabetic patients  with > or = 2 CHD risk factors. Aaron Aas    HDL  Date Value Ref Range Status  08/05/2022 50 > OR = 50 mg/dL Final   Triglycerides  Date Value Ref Range Status  08/05/2022 228 (H) <150 mg/dL Final    Comment:    . If a non-fasting specimen was collected, consider repeat triglyceride testing on a fasting specimen if clinically indicated.  Imagene Mam et al. J. of Clin. Lipidol. 2015;9:129-169. Aaron Aas          Passed - Last BP in normal range    BP Readings from Last 1 Encounters:  09/27/23 124/72         Passed - Last Heart Rate in normal range    Pulse Readings from Last 1 Encounters:  09/27/23 78         Passed - CBC within normal limits and completed in the last 12 months    WBC  Date Value Ref Range Status  09/13/2023 8.0 3.8 - 10.8 Thousand/uL Final   RBC  Date Value Ref Range Status  09/13/2023 4.49 3.80 - 5.10 Million/uL Final   Hemoglobin  Date Value Ref Range Status  09/13/2023 13.6 11.7 - 15.5 g/dL Final  94/85/4627 03.5 11.1 - 15.9 g/dL Final   HCT  Date Value Ref Range Status  09/13/2023 42.9 35.0 - 45.0 % Final   Hematocrit  Date Value Ref Range Status  12/16/2020 36.5 34.0 - 46.6 % Final   MCHC  Date Value Ref Range Status  09/13/2023 31.7 (L) 32.0 - 36.0 g/dL Final    Comment:    For adults, a slight decrease in the calculated MCHC value (in the  range of 30 to 32 g/dL) is most likely not clinically significant; however, it should be interpreted with caution in correlation with other red cell parameters and the patient's clinical condition.    [facility]  Date Value Ref Range Status  09/13/2023 30.3 27.0 - 33.0 pg Final   MCV  Date Value Ref Range Status  09/13/2023 95.5 80.0 - 100.0 fL Final  12/16/2020 91 79 - 97 fL Final   No results found for: "PLTCOUNTKUC", "LABPLAT", "POCPLA" RDW  Date Value Ref Range Status  09/13/2023 13.1 11.0 - 15.0 % Final  12/16/2020 13.7 11.7 - 15.4 % Final         Passed - CMP within normal limits and completed in the last 12 months    Albumin  Date Value Ref Range Status  02/12/2023 3.0 (L) 3.5 - 5.0 g/dL Final  00/93/8182 4.5 3.7 - 4.7 g/dL Final   Alkaline Phosphatase  Date Value Ref Range Status  02/12/2023 60 38 - 126 U/L Final   Alkaline phosphatase (APISO)  Date Value Ref Range Status  09/13/2023 100 37 - 153 U/L Final   ALT  Date Value Ref Range Status  09/13/2023 19 6 - 29 U/L Final   AST  Date Value Ref Range Status  09/13/2023 19 10 - 35 U/L Final   BUN  Date Value Ref Range Status  09/13/2023 21 7 - 25 mg/dL  Final  12/16/2020 17 8 - 27 mg/dL Final   Calcium   Date Value Ref Range Status  09/13/2023 8.7 8.6 - 10.4 mg/dL Final   Calcium , Ion  Date Value Ref Range Status  02/12/2023 1.17 1.15 - 1.40 mmol/L Final   CO2  Date Value Ref Range Status  09/13/2023 20 20 - 32 mmol/L Final   TCO2  Date Value Ref Range Status  02/12/2023 19 (L) 22 - 32 mmol/L Final   Creat  Date Value Ref Range Status  09/13/2023 1.33 (H) 0.60 - 1.00 mg/dL Final   Creatinine, Urine  Date Value Ref Range Status  09/13/2023 23 20 - 275 mg/dL Final   Glucose, Bld  Date Value Ref Range Status  09/13/2023 650 (HH) 65 - 99 mg/dL Final    Comment:    Verified by repeat analysis. Aaron Aas .            Fasting reference interval . For someone without known diabetes, a glucose value  >125 mg/dL indicates that they may have diabetes and this should be confirmed with a follow-up test. .    Glucose-Capillary  Date Value Ref Range Status  12/30/2022 109 (H) 70 - 99 mg/dL Final    Comment:    Glucose reference range applies only to samples taken after fasting for at least 8 hours.   Potassium  Date Value Ref Range Status  09/13/2023 5.4 (H) 3.5 - 5.3 mmol/L Final   Sodium  Date Value Ref Range Status  09/13/2023 127 (L) 135 - 146 mmol/L Final  12/16/2020 143 134 - 144 mmol/L Final   Total Bilirubin  Date Value Ref Range Status  09/13/2023 0.5 0.2 - 1.2 mg/dL Final   Bilirubin Total  Date Value Ref Range Status  12/16/2020 0.3 0.0 - 1.2 mg/dL Final   Bilirubin, Direct  Date Value Ref Range Status  06/18/2016 0.1 <=0.2 mg/dL Final   Indirect Bilirubin  Date Value Ref Range Status  06/18/2016 0.4 0.2 - 1.2 mg/dL Final   Protein, ur  Date Value Ref Range Status  02/12/2023 NEGATIVE NEGATIVE mg/dL Final   Total Protein, Urine  Date Value Ref Range Status  08/05/2022 23 5 - 24 mg/dL Final   Total Protein  Date Value Ref Range Status  09/13/2023 6.5 6.1 - 8.1 g/dL Final  16/03/9603 7.4 6.0 - 8.5 g/dL Final   GFR, Est African American  Date Value Ref Range Status  08/11/2020 56 (L) > OR = 60 mL/min/1.23m2 Final   eGFR  Date Value Ref Range Status  01/28/2023 50 (L) > OR = 60 mL/min/1.76m2 Final  12/16/2020 50 (L) >59 mL/min/1.73 Final   GFR, Est Non African American  Date Value Ref Range Status  08/11/2020 48 (L) > OR = 60 mL/min/1.82m2 Final   GFR, Estimated  Date Value Ref Range Status  02/12/2023 43 (L) >60 mL/min Final    Comment:    (NOTE) Calculated using the CKD-EPI Creatinine Equation (2021)           atorvastatin  (LIPITOR) 40 MG tablet [Pharmacy Med Name: ATORVASTATIN  CALCIUM  40MG  TABLET] 90 tablet 2    Sig: TAKE ONE TABLET BY MOUTH ONCE A DAY     Cardiovascular:  Antilipid - Statins Failed - 11/22/2023  4:27 PM       Failed - Lipid Panel in normal range within the last 12 months    Cholesterol  Date Value Ref Range Status  08/05/2022 142 <200 mg/dL Final   LDL Cholesterol (Calc)  Date Value Ref Range Status  08/05/2022 64 mg/dL (calc) Final    Comment:    Reference range: <100 . Desirable range <100 mg/dL for primary prevention;   <70 mg/dL for patients with CHD or diabetic patients  with > or = 2 CHD risk factors. Aaron Aas LDL-C is now calculated using the Martin-Hopkins  calculation, which is a validated novel method providing  better accuracy than the Friedewald equation in the  estimation of LDL-C.  Melinda Sprawls et al. Erroll Heard. 0272;536(64): 2061-2068  (http://education.[facility].com/faq/FAQ164)    Direct LDL  Date Value Ref Range Status  11/22/2019 62 <100 mg/dL Final    Comment:    Greatly elevated Triglycerides values (>1200 mg/dL) interfere with the dLDL assay. As no Triglycerides  testing was ordered, interpret results with caution. . Desirable range <100 mg/dL for primary prevention;   <70 mg/dL for patients with CHD or diabetic patients  with > or = 2 CHD risk factors. Aaron Aas    HDL  Date Value Ref Range Status  08/05/2022 50 > OR = 50 mg/dL Final   Triglycerides  Date Value Ref Range Status  08/05/2022 228 (H) <150 mg/dL Final    Comment:    . If a non-fasting specimen was collected, consider repeat triglyceride testing on a fasting specimen if clinically indicated.  Imagene Mam et al. J. of Clin. Lipidol. 2015;9:129-169. Aaron Aas          Passed - Patient is not pregnant      Passed - Valid encounter within last 12 months    Recent Outpatient Visits           1 month ago Type 2 diabetes mellitus with stage 3a chronic kidney disease, without long-term current use of insulin  (HCC)   [place] [facility] Austine Lefort, MD   2 months ago Type 2 diabetes mellitus with hyperlipidemia [facility])   [place] [facility] Austine Lefort, MD    9 months ago Type 2 diabetes mellitus with hyperlipidemia [facility])   [place] [facility] Pickard, Cisco Crest, MD   1 year ago Encounter for screening mammogram for malignant neoplasm of breast   [place] [facility] Austine Lefort, MD   1 year ago Type 2 diabetes mellitus with hyperlipidemia [facility])   [facility] Pickard, Cisco Crest, MD              Signed Prescriptions Disp Refills   Continuous Glucose Sensor (DEXCOM G7 SENSOR) MISC 3 each 1    Sig: CHANGE SENSOR EVERY TEN DAYS.     Endocrinology: Diabetes - Testing Supplies Passed - 11/22/2023  4:27 PM      Passed - Valid encounter within last 12 months    Recent Outpatient Visits           1 month ago Type 2 diabetes mellitus with stage 3a chronic kidney disease, without long-term current use of insulin  [facility])   [place] [facility] Austine Lefort, MD   2 months ago Type 2 diabetes mellitus with hyperlipidemia [facility])   [place] [facility] Austine Lefort, MD   9 months ago Type 2 diabetes mellitus with hyperlipidemia [facility])   [place] [facility] Pickard, Cisco Crest, MD   1 year ago Encounter for screening mammogram for malignant neoplasm of breast    [facility] Pickard, Cisco Crest, MD   1  year ago Type 2 diabetes mellitus with hyperlipidemia (HCC)   [place] [facility] Pickard, Cisco Crest, MD               traZODone  (DESYREL ) 50 MG tablet 90 tablet 1    Sig: TAKE ONE TABLET BY MOUTH EVERY NIGHT AT BEDTIME     Psychiatry: Antidepressants - Serotonin Modulator Failed - 11/22/2023  4:27 PM      Failed - Valid encounter within last 6 months    Recent Outpatient Visits           1 month ago Type 2 diabetes mellitus with stage 3a chronic kidney disease, without long-term current use of insulin  (HCC)   [place] [facility] Pickard,  Cisco Crest, MD   2 months ago Type 2 diabetes mellitus with hyperlipidemia [facility])   [place] [facility] Austine Lefort, MD   9 months ago Type 2 diabetes mellitus with hyperlipidemia [facility])   [place] [facility] Austine Lefort, MD   1 year ago Encounter for screening mammogram for malignant neoplasm of breast   [place] [facility] Medicine Austine Lefort, MD   1 year ago Type 2 diabetes mellitus with hyperlipidemia [facility])    [facility] Pickard, Cisco Crest, MD               MYRBETRIQ  25 MG TB24 tablet 90 tablet 1    Sig: TAKE ONE TABLET BY MOUTH ONCE A DAY     Urology: Bladder Agents - mirabegron  Failed - 11/22/2023  4:27 PM      Failed - Cr in normal range and within 360 days    Creat  Date Value Ref Range Status  09/13/2023 1.33 (H) 0.60 - 1.00 mg/dL Final   Creatinine, Urine  Date Value Ref Range Status  09/13/2023 23 20 - 275 mg/dL Final         Passed - ALT in normal range and within 360 days    ALT  Date Value Ref Range Status  09/13/2023 19 6 - 29 U/L Final         Passed - AST in normal range and within 360 days    AST  Date Value Ref Range Status  09/13/2023 19 10 - 35 U/L Final         Passed - eGFR is 15 or above and within 360 days    GFR, Est African American  Date Value Ref Range Status  08/11/2020 56 (L) > OR = 60 mL/min/1.59m2 Final   GFR, Est Non African American  Date Value Ref Range Status  08/11/2020 48 (L) > OR = 60 mL/min/1.47m2 Final   GFR, Estimated  Date Value Ref Range Status  02/12/2023 43 (L) >60 mL/min Final    Comment:    (NOTE) Calculated using the CKD-EPI Creatinine Equation (2021)    eGFR  Date Value Ref Range Status  01/28/2023 50 (L) > OR = 60 mL/min/1.74m2 Final  12/16/2020 50 (L) >59 mL/min/1.73 Final         Passed - Last BP in normal range    BP Readings from Last 1 Encounters:  09/27/23 124/72         Passed - Valid encounter  within last 12 months    Recent Outpatient Visits           1 month ago Type 2 diabetes mellitus with stage 3a chronic kidney  disease, without long-term current use of insulin  [facility])   [facility] Austine Lefort, MD   2 months ago Type 2 diabetes mellitus with hyperlipidemia [facility])   [facility] Austine Lefort, MD   9 months ago Type 2 diabetes mellitus with hyperlipidemia [facility])   [place] [facility] Pickard, Cisco Crest, MD   1 year ago Encounter for screening mammogram for malignant neoplasm of breast   [place] [facility] Austine Lefort, MD   1 year ago Type 2 diabetes mellitus with hyperlipidemia [facility])   [place] [facility] Pickard, Cisco Crest, MD               pantoprazole  (PROTONIX ) 40 MG tablet 90 tablet 2    Sig: TAKE ONE TABLET BY MOUTH EVERY MORNING     Gastroenterology: Proton Pump Inhibitors Passed - 11/22/2023  4:27 PM      Passed - Valid encounter within last 12 months    Recent Outpatient Visits           1 month ago Type 2 diabetes mellitus with stage 3a chronic kidney disease, without long-term current use of insulin  (HCC)   [place] [facility] Austine Lefort, MD   2 months ago Type 2 diabetes mellitus with hyperlipidemia [facility])   [place] [facility] Austine Lefort, MD   9 months ago Type 2 diabetes mellitus with hyperlipidemia [facility])   [place] [facility] Pickard, Cisco Crest, MD   1 year ago Encounter for screening mammogram for malignant neoplasm of breast   [place] [facility] Austine Lefort, MD   1 year ago Type 2 diabetes mellitus with hyperlipidemia [facility])    [facility] Pickard, Cisco Crest, MD

## 2023-11-22 NOTE — Telephone Encounter (Signed)
 OV 09/27/23 Requested Prescriptions  Pending Prescriptions Disp Refills   QUEtiapine  (SEROQUEL ) 50 MG tablet [Pharmacy Med Name: QUETIAPINE  FUMARATE 50MG  TABLET] 30 tablet 0    Sig: TAKE ONE TABLET BY MOUTH ONCE A DAY     Not Delegated - Psychiatry:  Antipsychotics - Second Generation (Atypical) - quetiapine  Failed - 11/22/2023  4:26 PM      Failed - This refill cannot be delegated      Failed - TSH in normal range and within 360 days    TSH  Date Value Ref Range Status  12/15/2021 2.10 0.40 - 4.50 mIU/L Final         Failed - Valid encounter within last 6 months    Recent Outpatient Visits           1 month ago Type 2 diabetes mellitus with stage 3a chronic kidney disease, without long-term current use of insulin  (HCC)   [place] [facility] Austine Lefort, MD   2 months ago Type 2 diabetes mellitus with hyperlipidemia [facility]   [place] Austine Lefort, MD   9 months ago Type 2 diabetes mellitus with hyperlipidemia [facility])   [place] [facility] Pickard, Cisco Crest, MD   1 year ago Encounter for screening mammogram for malignant neoplasm of breast   [place] [facility] Austine Lefort, MD   1 year ago Type 2 diabetes mellitus with hyperlipidemia [facility])   [place] [facility] Austine Lefort, MD              Failed - Lipid Panel in normal range within the last 12 months    Cholesterol  Date Value Ref Range Status  08/05/2022 142 <200 mg/dL Final   LDL Cholesterol (Calc)  Date Value Ref Range Status  08/05/2022 64 mg/dL (calc) Final    Comment:    Reference range: <100 . Desirable range <100 mg/dL for primary prevention;   <70 mg/dL for patients with CHD or diabetic patients  with > or = 2 CHD risk factors. Aaron Aas LDL-C is now calculated using the Martin-Hopkins  calculation, which is a validated novel method providing  better accuracy than the Friedewald  equation in the  estimation of LDL-C.  Melinda Sprawls et al. Erroll Heard. 4540;981(19): 2061-2068  (http://education.[employer].com/faq/FAQ164)    Direct LDL  Date Value Ref Range Status  11/22/2019 62 <100 mg/dL Final    Comment:    Greatly elevated Triglycerides values (>1200 mg/dL) interfere with the dLDL assay. As no Triglycerides  testing was ordered, interpret results with caution. . Desirable range <100 mg/dL for primary prevention;   <70 mg/dL for patients with CHD or diabetic patients  with > or = 2 CHD risk factors. Aaron Aas    HDL  Date Value Ref Range Status  08/05/2022 50 > OR = 50 mg/dL Final   Triglycerides  Date Value Ref Range Status  08/05/2022 228 (H) <150 mg/dL Final    Comment:    . If a non-fasting specimen was collected, consider repeat triglyceride testing on a fasting specimen if clinically indicated.  Imagene Mam et al. J. of Clin. Lipidol. 2015;9:129-169. Aaron Aas          Passed - Last BP in normal range    BP Readings from Last 1 Encounters:  09/27/23 124/72         Passed - Last Heart Rate in normal range    Pulse Readings from Last  1 Encounters:  09/27/23 78         Passed - CBC within normal limits and completed in the last 12 months    WBC  Date Value Ref Range Status  09/13/2023 8.0 3.8 - 10.8 Thousand/uL Final   RBC  Date Value Ref Range Status  09/13/2023 4.49 3.80 - 5.10 Million/uL Final   Hemoglobin  Date Value Ref Range Status  09/13/2023 13.6 11.7 - 15.5 g/dL Final  82/95/6213 08.6 11.1 - 15.9 g/dL Final   HCT  Date Value Ref Range Status  09/13/2023 42.9 35.0 - 45.0 % Final   Hematocrit  Date Value Ref Range Status  12/16/2020 36.5 34.0 - 46.6 % Final   MCHC  Date Value Ref Range Status  09/13/2023 31.7 (L) 32.0 - 36.0 g/dL Final    Comment:    For adults, a slight decrease in the calculated MCHC value (in the range of 30 to 32 g/dL) is most likely not clinically significant; however, it should be interpreted with caution  in correlation with other red cell parameters and the patient's clinical condition.    [facility]  Date Value Ref Range Status  09/13/2023 30.3 27.0 - 33.0 pg Final   MCV  Date Value Ref Range Status  09/13/2023 95.5 80.0 - 100.0 fL Final  12/16/2020 91 79 - 97 fL Final   No results found for: "PLTCOUNTKUC", "LABPLAT", "POCPLA" RDW  Date Value Ref Range Status  09/13/2023 13.1 11.0 - 15.0 % Final  12/16/2020 13.7 11.7 - 15.4 % Final         Passed - CMP within normal limits and completed in the last 12 months    Albumin  Date Value Ref Range Status  02/12/2023 3.0 (L) 3.5 - 5.0 g/dL Final  57/84/6962 4.5 3.7 - 4.7 g/dL Final   Alkaline Phosphatase  Date Value Ref Range Status  02/12/2023 60 38 - 126 U/L Final   Alkaline phosphatase (APISO)  Date Value Ref Range Status  09/13/2023 100 37 - 153 U/L Final   ALT  Date Value Ref Range Status  09/13/2023 19 6 - 29 U/L Final   AST  Date Value Ref Range Status  09/13/2023 19 10 - 35 U/L Final   BUN  Date Value Ref Range Status  09/13/2023 21 7 - 25 mg/dL Final  95/28/4132 17 8 - 27 mg/dL Final   Calcium   Date Value Ref Range Status  09/13/2023 8.7 8.6 - 10.4 mg/dL Final   Calcium , Ion  Date Value Ref Range Status  02/12/2023 1.17 1.15 - 1.40 mmol/L Final   CO2  Date Value Ref Range Status  09/13/2023 20 20 - 32 mmol/L Final   TCO2  Date Value Ref Range Status  02/12/2023 19 (L) 22 - 32 mmol/L Final   Creat  Date Value Ref Range Status  09/13/2023 1.33 (H) 0.60 - 1.00 mg/dL Final   Creatinine, Urine  Date Value Ref Range Status  09/13/2023 23 20 - 275 mg/dL Final   Glucose, Bld  Date Value Ref Range Status  09/13/2023 650 (HH) 65 - 99 mg/dL Final    Comment:    Verified by repeat analysis. Aaron Aas .            Fasting reference interval . For someone without known diabetes, a glucose value >125 mg/dL indicates that they may have diabetes and this should be confirmed with a follow-up test. .     Glucose-Capillary  Date Value Ref Range Status  12/30/2022  109 (H) 70 - 99 mg/dL Final    Comment:    Glucose reference range applies only to samples taken after fasting for at least 8 hours.   Potassium  Date Value Ref Range Status  09/13/2023 5.4 (H) 3.5 - 5.3 mmol/L Final   Sodium  Date Value Ref Range Status  09/13/2023 127 (L) 135 - 146 mmol/L Final  12/16/2020 143 134 - 144 mmol/L Final   Total Bilirubin  Date Value Ref Range Status  09/13/2023 0.5 0.2 - 1.2 mg/dL Final   Bilirubin Total  Date Value Ref Range Status  12/16/2020 0.3 0.0 - 1.2 mg/dL Final   Bilirubin, Direct  Date Value Ref Range Status  06/18/2016 0.1 <=0.2 mg/dL Final   Indirect Bilirubin  Date Value Ref Range Status  06/18/2016 0.4 0.2 - 1.2 mg/dL Final   Protein, ur  Date Value Ref Range Status  02/12/2023 NEGATIVE NEGATIVE mg/dL Final   Total Protein, Urine  Date Value Ref Range Status  08/05/2022 23 5 - 24 mg/dL Final   Total Protein  Date Value Ref Range Status  09/13/2023 6.5 6.1 - 8.1 g/dL Final  78/29/5621 7.4 6.0 - 8.5 g/dL Final   GFR, Est African American  Date Value Ref Range Status  08/11/2020 56 (L) > OR = 60 mL/min/1.73m2 Final   eGFR  Date Value Ref Range Status  01/28/2023 50 (L) > OR = 60 mL/min/1.12m2 Final  12/16/2020 50 (L) >59 mL/min/1.73 Final   GFR, Est Non African American  Date Value Ref Range Status  08/11/2020 48 (L) > OR = 60 mL/min/1.19m2 Final   GFR, Estimated  Date Value Ref Range Status  02/12/2023 43 (L) >60 mL/min Final    Comment:    (NOTE) Calculated using the CKD-EPI Creatinine Equation (2021)           atorvastatin  (LIPITOR) 40 MG tablet [Pharmacy Med Name: ATORVASTATIN  CALCIUM  40MG  TABLET] 90 tablet 2    Sig: TAKE ONE TABLET BY MOUTH ONCE A DAY     Cardiovascular:  Antilipid - Statins Failed - 11/22/2023  4:26 PM      Failed - Lipid Panel in normal range within the last 12 months    Cholesterol  Date Value Ref Range Status   08/05/2022 142 <200 mg/dL Final   LDL Cholesterol (Calc)  Date Value Ref Range Status  08/05/2022 64 mg/dL (calc) Final    Comment:    Reference range: <100 . Desirable range <100 mg/dL for primary prevention;   <70 mg/dL for patients with CHD or diabetic patients  with > or = 2 CHD risk factors. Aaron Aas LDL-C is now calculated using the Martin-Hopkins  calculation, which is a validated novel method providing  better accuracy than the Friedewald equation in the  estimation of LDL-C.  Melinda Sprawls et al. Erroll Heard. 3086;578(46): 2061-2068  (http://education.[facility].com/faq/FAQ164)    Direct LDL  Date Value Ref Range Status  11/22/2019 62 <100 mg/dL Final    Comment:    Greatly elevated Triglycerides values (>1200 mg/dL) interfere with the dLDL assay. As no Triglycerides  testing was ordered, interpret results with caution. . Desirable range <100 mg/dL for primary prevention;   <70 mg/dL for patients with CHD or diabetic patients  with > or = 2 CHD risk factors. Aaron Aas    HDL  Date Value Ref Range Status  08/05/2022 50 > OR = 50 mg/dL Final   Triglycerides  Date Value Ref Range Status  08/05/2022 228 (H) <150 mg/dL Final  Comment:    . If a non-fasting specimen was collected, consider repeat triglyceride testing on a fasting specimen if clinically indicated.  Imagene Mam et al. J. of Clin. Lipidol. 2015;9:129-169. Aaron Aas          Passed - Patient is not pregnant      Passed - Valid encounter within last 12 months    Recent Outpatient Visits           1 month ago Type 2 diabetes mellitus with stage 3a chronic kidney disease, without long-term current use of insulin  (HCC)   [place] [facility] Austine Lefort, MD   2 months ago Type 2 diabetes mellitus with hyperlipidemia [facility])   [place] [facility] Austine Lefort, MD   9 months ago Type 2 diabetes mellitus with hyperlipidemia [facility])   [place] [facility]  Pickard, Cisco Crest, MD   1 year ago Encounter for screening mammogram for malignant neoplasm of breast   [place] [facility] Austine Lefort, MD   1 year ago Type 2 diabetes mellitus with hyperlipidemia [facility])   [place] [facility] Pickard, Cisco Crest, MD               Continuous Glucose Sensor (DEXCOM G7 SENSOR) MISC [Pharmacy Med Name: DEXCOM G7 SENSOR SENSOR MISC] 3 each 1    Sig: CHANGE SENSOR EVERY TEN DAYS.     Endocrinology: Diabetes - Testing Supplies Passed - 11/22/2023  4:26 PM      Passed - Valid encounter within last 12 months    Recent Outpatient Visits           1 month ago Type 2 diabetes mellitus with stage 3a chronic kidney disease, without long-term current use of insulin  (HCC)   [place] [facility] Austine Lefort, MD   2 months ago Type 2 diabetes mellitus with hyperlipidemia [facility])   [place] [facility] Austine Lefort, MD   9 months ago Type 2 diabetes mellitus with hyperlipidemia [facility])   [place] [facility] Pickard, Cisco Crest, MD   1 year ago Encounter for screening mammogram for malignant neoplasm of breast   [place] [facility] Austine Lefort, MD   1 year ago Type 2 diabetes mellitus with hyperlipidemia [facility])   [facility] Pickard, Cisco Crest, MD               traZODone  (DESYREL ) 50 MG tablet [Pharmacy Med Name: TRAZODONE  HYDROCHLORIDE 50MG  TABLET] 90 tablet 1    Sig: TAKE ONE TABLET BY MOUTH EVERY NIGHT AT BEDTIME     Psychiatry: Antidepressants - Serotonin Modulator Failed - 11/22/2023  4:26 PM      Failed - Valid encounter within last 6 months    Recent Outpatient Visits           1 month ago Type 2 diabetes mellitus with stage 3a chronic kidney disease, without long-term current use of insulin  [facility])   [place] [facility] Austine Lef[facility], MD   2 months ago Type 2  diabetes mellitus with hyperlipidemia [facility])   [place] [facility] Austine Lefort, MD   9 months ago Type 2 diabetes mellitus with hyperlipidemia [facility])   [place] [facility] Pickard, Cisco Crest, MD   1 year ago Encounter for screening mammogram for malignant  neoplasm of breast   [place] [facility] Medicine Cheril Cork, Cisco Crest, MD   1 year ago Type 2 diabetes mellitus with hyperlipidemia [facility])   [place] [facility] Medicine Pickard, Cisco Crest, MD               MYRBETRIQ  25 MG TB24 tablet [Pharmacy Med Name: MYRBETRIQ  25MG  TABLET ER 24HR] 90 tablet 1    Sig: TAKE ONE TABLET BY MOUTH ONCE A DAY     Urology: Bladder Agents - mirabegron  Failed - 11/22/2023  4:26 PM      Failed - Cr in normal range and within 360 days    Creat  Date Value Ref Range Status  09/13/2023 1.33 (H) 0.60 - 1.00 mg/dL Final   Creatinine, Urine  Date Value Ref Range Status  09/13/2023 23 20 - 275 mg/dL Final         Passed - ALT in normal range and within 360 days    ALT  Date Value Ref Range Status  09/13/2023 19 6 - 29 U/L Final         Passed - AST in normal range and within 360 days    AST  Date Value Ref Range Status  09/13/2023 19 10 - 35 U/L Final         Passed - eGFR is 15 or above and within 360 days    GFR, Est African American  Date Value Ref Range Status  08/11/2020 56 (L) > OR = 60 mL/min/1.21m2 Final   GFR, Est Non African American  Date Value Ref Range Status  08/11/2020 48 (L) > OR = 60 mL/min/1.36m2 Final   GFR, Estimated  Date Value Ref Range Status  02/12/2023 43 (L) >60 mL/min Final    Comment:    (NOTE) Calculated using the CKD-EPI Creatinine Equation (2021)    eGFR  Date Value Ref Range Status  01/28/2023 50 (L) > OR = 60 mL/min/1.49m2 Final  12/16/2020 50 (L) >59 mL/min/1.73 Final         Passed - Last BP in normal range    BP Readings from Last 1 Encounters:  09/27/23 124/72         Passed -  Valid encounter within last 12 months    Recent Outpatient Visits           1 month ago Type 2 diabetes mellitus with stage 3a chronic kidney disease, without long-term current use of insulin  (HCC)   [place] [facility] Austine Lefort, MD   2 months ago Type 2 diabetes mellitus with hyperlipidemia [facility])   [facility] [facility] Austine Lefort, MD   9 months ago Type 2 diabetes mellitus with hyperlipidemia [facility])   [place] Austine Lefort, MD   1 year ago Encounter for screening mammogram for malignant neoplasm of breast   [place] [facility] Medicine Austine Lefort, MD   1 year ago Type 2 diabetes mellitus with hyperlipidemia [facility])   [place] [facility] Pickard, Cisco Crest, MD               pantoprazole  (PROTONIX ) 40 MG tablet [Pharmacy Med Name: PANTOPRAZOLE  SODIUM 40MG  TABLET DR] 90 tablet 2    Sig: TAKE ONE TABLET BY MOUTH EVERY MORNING     Gastroenterology: Proton Pump Inhibitors Passed - 11/22/2023  4:26 PM      Passed - Valid encounter within last  12 months    Recent Outpatient Visits           1 month ago Type 2 diabetes mellitus with stage 3a chronic kidney disease, without long-term current use of insulin  (HCC)   [place] [facility] Austine Lefort, MD   2 months ago Type 2 diabetes mellitus with hyperlipidemia [facility])   [place] [facility] Medicine Austine Lefort, MD   9 months ago Type 2 diabetes mellitus with hyperlipidemia [facility])   [place] [facility] Medicine Pickard, Cisco Crest, MD   1 year ago Encounter for screening mammogram for malignant neoplasm of breast   [place] [facility] Medicine Austine Lefort, MD   1 year ago Type 2 diabetes mellitus with hyperlipidemia [facility])    [facility] Pickard, Cisco Crest, MD

## 2023-11-28 ENCOUNTER — Other Ambulatory Visit: Payer: Self-pay | Admitting: Family Medicine

## 2023-12-26 ENCOUNTER — Other Ambulatory Visit: Payer: Self-pay | Admitting: Family Medicine

## 2023-12-28 ENCOUNTER — Other Ambulatory Visit: Payer: Self-pay | Admitting: Family Medicine

## 2023-12-28 NOTE — Telephone Encounter (Signed)
 Requested medication (s) are due for refill today: routing for review  Requested medication (s) are on the active medication list: no  Last refill:  unknown  Future visit scheduled: no  Notes to clinic:  Unable to refill per protocol, Rx expired. Not on current list.      Requested Prescriptions  Pending Prescriptions Disp Refills   PARoxetine  (PAXIL ) 20 MG tablet [Pharmacy Med Name: PAROXETINE  HYDROCHLORIDE 20MG  TABLET] 30 tablet 0    Sig: TAKE 1 TABLET BY MOUTH ONCE A DAY     Psychiatry:  Antidepressants - SSRI Passed - 12/28/2023  2:00 PM      Passed - Valid encounter within last 6 months    Recent Outpatient Visits           3 months ago Type 2 diabetes mellitus with stage 3a chronic kidney disease, without long-term current use of insulin  (HCC)   [place] [facility] Medicine Duanne Butler DASEN, MD   3 months ago Type 2 diabetes mellitus with hyperlipidemia [facility])   [place] [facility] Duanne Butler DASEN, MD   11 months ago Type 2 diabetes mellitus with hyperlipidemia [facility])   [place] [facility] Pickard, Butler DASEN, MD   1 year ago Encounter for screening mammogram for malignant neoplasm of breast   [place] [facility] Medicine Duanne Butler DASEN, MD   2 years ago Type 2 diabetes mellitus with hyperlipidemia [facility])   [place] [facility] Family Medicine Pickard, Butler DASEN, MD

## 2024-01-23 ENCOUNTER — Other Ambulatory Visit: Payer: Self-pay | Admitting: Family Medicine

## 2024-01-25 ENCOUNTER — Other Ambulatory Visit: Payer: Self-pay | Admitting: Family Medicine

## 2024-01-26 ENCOUNTER — Other Ambulatory Visit: Payer: Self-pay | Admitting: Family Medicine

## 2024-02-06 ENCOUNTER — Other Ambulatory Visit: Payer: Self-pay | Admitting: Family Medicine

## 2024-02-07 ENCOUNTER — Telehealth: Payer: Self-pay

## 2024-02-07 ENCOUNTER — Other Ambulatory Visit: Payer: Self-pay

## 2024-02-07 DIAGNOSIS — N1831 Chronic kidney disease, stage 3a: Secondary | ICD-10-CM

## 2024-02-07 MED ORDER — DEXCOM G7 SENSOR MISC: 12 refills | Status: AC

## 2024-02-07 NOTE — Telephone Encounter (Signed)
 Copied from CRM [id number]. Topic: Clinical - Prescription Issue >> Feb 07, 2024 11:09 AM Delon DASEN wrote: Reason for CRM: Continuous Glucose Sensor (DEXCOM G7 SENSOR) MISC- [place] with [facility]- need new order for 3 sensors, only received for one- [phone]- patient wants to pick up by lunch

## 2024-02-07 NOTE — Telephone Encounter (Signed)
 Copied from CRM [id number]. Topic: Clinical - Prescription Issue >> Feb 07, 2024 11:09 AM Delon DASEN wrote: Reason for CRM: Continuous Glucose Sensor (DEXCOM G7 SENSOR) MISC- [place] [facility]- need new order for 3 sensors, only received for one- [phone]- patient wants to pick up by lunch

## 2024-02-07 NOTE — Telephone Encounter (Signed)
 Copied from CRM [id number]. Topic: Clinical - Medication Question >> Feb 06, 2024  4:17 PM Jayma L wrote: Reason for CRM: chris from [facility] pharmacy called asking for 3 refills for the dexcom G7, said 1 was filled and they still need 2 more, the one refill will only last 10 days . When able to please send to [facility]

## 2024-02-15 ENCOUNTER — Ambulatory Visit

## 2024-02-15 VITALS — Ht 65.0 in | Wt 156.0 lb

## 2024-02-15 DIAGNOSIS — Z0000 Encounter for general adult medical examination without abnormal findings: Secondary | ICD-10-CM | POA: Diagnosis not present

## 2024-02-15 NOTE — Progress Notes (Signed)
 Subjective:   Kathryn Wall is a 78 y.o. who presents for a Medicare Wellness preventive visit.  As a reminder, Annual Wellness Visits don't include a physical exam, and some assessments may be limited, especially if this visit is performed virtually. We may recommend an in-person follow-up visit with your provider if needed.  Visit Complete: Virtual I connected with  Kathryn Wall on 02/15/24 by a audio enabled telemedicine application and verified that I am speaking with the correct person using two identifiers.  Patient Location: Home  Provider Location: Home Office  I discussed the limitations of evaluation and management by telemedicine. The patient expressed understanding and agreed to proceed.  Vital Signs: Because this visit was a virtual/telehealth visit, some criteria may be missing or patient reported. Any vitals not documented were not able to be obtained and vitals that have been documented are patient reported.  VideoDeclined- This patient declined Librarian, academic. Therefore the visit was completed with audio only.  Persons Participating in Visit: Patient.  AWV Questionnaire: No: Patient Medicare AWV questionnaire was not completed prior to this visit.  Cardiac Risk Factors include: advanced age (>73men, >36 women);diabetes mellitus;dyslipidemia;hypertension     Objective:    Today's Vitals   02/15/24 1357  Weight: 156 lb (70.8 kg)  Height: 5' 5 (1.651 m)   Body mass index is 25.96 kg/m.     02/15/2024    2:09 PM 02/12/2023    6:40 PM 02/10/2023    3:58 PM 03/24/2022   11:08 PM 03/24/2022   11:00 PM 03/23/2022    6:12 PM 01/26/2022    9:50 PM  Advanced Directives  Does Patient Have a Medical Advance Directive? Yes Yes Yes Yes  Yes No  Type of Advance Directive Living will;Healthcare Power of [employer] Power of [place];Living will  Living will;Healthcare Power of Attorney Living will;Healthcare Power of [employer] Power of [place];Living will  Does patient want to make changes to medical advance directive? No - Patient declined  No - Patient declined No - Patient declined     Copy of Healthcare Power of Attorney in Chart? Yes - validated most recent copy scanned in chart (See row information)  Yes - validated most recent copy scanned in chart (See row information)  No - copy requested      Current Medications (verified) Outpatient Encounter Medications as of 02/15/2024  Medication Sig   acetaminophen  (TYLENOL ) 500 MG tablet Take 1,000 mg by mouth daily as needed for moderate pain, fever or headache.   amLODipine  (NORVASC ) 5 MG tablet TAKE 1 TABLET BY MOUTH ONCE A DAY   apixaban  (ELIQUIS ) 5 MG TABS tablet TAKE ONE TABLET BY MOUTH TWICE A DAY   atorvastatin  (LIPITOR) 40 MG tablet TAKE ONE TABLET BY MOUTH ONCE A DAY   Continuous Glucose Sensor (DEXCOM G7 SENSOR) MISC CHANGE SENSOR EVERY TEN DAYS.   insulin  glargine (LANTUS  SOLOSTAR) 100 UNIT/ML Solostar Pen Inject 44 Units into the skin daily. Inject 15 units starting on 09/17/23, increase by 1 unit per day until fasting blood sugars are less than 130.   Insulin  Pen Needle 31G X 8 MM MISC USE TO ADMINISTER INSULIN  DAILY.   JARDIANCE  25 MG TABS tablet TAKE ONE TABLET BY MOUTH ONCE A DAY   losartan  (COZAAR ) 50 MG tablet TAKE ONE TABLET BY MOUTH ONCE A DAY   metoprolol  succinate (TOPROL -XL) 25 MG 24 hr tablet TAKE 1 TABLET BY MOUTH ONCE A DAY   Multiple Vitamins-Minerals (  ONE A DAY WOMEN 50 PLUS) CHEW Chew 1 each by mouth daily.   MYRBETRIQ  25 MG TB24 tablet TAKE ONE TABLET BY MOUTH ONCE A DAY   ondansetron  (ZOFRAN -ODT) 4 MG disintegrating tablet Take 1 tablet (4 mg total) by mouth every 8 (eight) hours as needed for nausea or vomiting.   pantoprazole  (PROTONIX ) 40 MG tablet TAKE ONE TABLET BY MOUTH EVERY MORNING   PARoxetine  (PAXIL ) 20 MG tablet TAKE 1 TABLET BY MOUTH ONCE A DAY   QUEtiapine  (SEROQUEL ) 50 MG tablet TAKE ONE TABLET BY MOUTH ONCE A  DAY   traZODone  (DESYREL ) 50 MG tablet TAKE ONE TABLET BY MOUTH EVERY NIGHT AT BEDTIME   venlafaxine  XR (EFFEXOR -XR) 75 MG 24 hr capsule Take 1 capsule (75 mg total) by mouth daily with breakfast.   metFORMIN  (GLUCOPHAGE -XR) 500 MG 24 hr tablet TAKE FOUR TABLETS BY MOUTH ONCE DAILY WITH BREAKFAST. (Patient not taking: Reported on 02/15/2024)   ondansetron  (ZOFRAN -ODT) 4 MG disintegrating tablet Take 4 mg by mouth every 8 (eight) hours as needed for nausea or vomiting.   No facility-administered encounter medications on file as of 02/15/2024.    Allergies (verified) Ambien  [zolpidem  tartrate], Cipro  [ciprofloxacin  hcl], Cymbalta [duloxetine hcl], Other, Cardura  [doxazosin ], and Percocet [oxycodone -acetaminophen ]   History: Past Medical History:  Diagnosis Date   Anxiety    Breast cancer (HCC) 06/29/1995   AGE 41, BRCA 1 NEGATIVE 2. 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    CKD (chronic kidney disease), stage III (HCC)    Cognitive deficits    mild   Depression    Diabetes mellitus    TYPE II   High cholesterol    Hypertension    PAF (paroxysmal atrial fibrillation) (HCC)    Past Surgical History:  Procedure Laterality Date   ABDOMINAL HYSTERECTOMY  06/29/1983   TAH   ANKLE SURGERY Right    APPLICATION OF WOUND VAC Left 01/14/2022   Procedure: APPLICATION OF WOUND VAC;  Surgeon: Lowery Estefana RAMAN, DO;  Location: [facility] OR;  Service: Plastics;  Laterality: Left;   BREAST SURGERY  06/29/1995   RIGHT BREAST LUMPECTOMY   CHOLECYSTECTOMY  06/28/1978   COLONOSCOPY     Family History  Problem Relation Age of Onset   Cancer Mother        COLON   Hypertension Father    Heart disease Father    Social History   Socioeconomic History   Marital status: Married    Spouse name: Not on file   Number of children: Not on file   Years of education: Not on file   Highest education level: Not on file  Occupational History   Not on file  Tobacco Use   Smoking status: Never    Smokeless tobacco: Never  Vaping Use   Vaping status: Never Used  Substance and Sexual Activity   Alcohol use: No   Drug use: No   Sexual activity: Yes    Birth control/protection: Post-menopausal, Surgical    Comment: hysterectomy  Other Topics Concern   Not on file  Social History Narrative   Not on file   Social Drivers of Health   Financial Resource Strain: Low Risk  (02/15/2024)   Overall Financial Resource Strain (CARDIA)    Difficulty of Paying Living Expenses: Not hard at all  Food Insecurity: No Food Insecurity (02/15/2024)   Hunger Vital Sign    Worried About Running Out of Food in the Last Year: Never true    Ran Out of  Food in the Last Year: Never true  Transportation Needs: No Transportation Needs (02/15/2024)   PRAPARE - Administrator, Civil Service (Medical): No    Lack of Transportation (Non-Medical): No  Physical Activity: Insufficiently Active (02/15/2024)   Exercise Vital Sign    Days of Exercise per Week: 3 days    Minutes of Exercise per Session: 30 min  Stress: No Stress Concern Present (02/15/2024)   [employer] of Occupational Health - Occupational Stress Questionnaire    Feeling of Stress: Not at all  Social Connections: Moderately Integrated (02/15/2024)   Social Connection and Isolation Panel    Frequency of Communication with Friends and Family: More than three times a week    Frequency of Social Gatherings with Friends and Family: Three times a week    Attends Religious Services: 1 to 4 times per year    Active Member of Clubs or Organizations: No    Attends Banker Meetings: Never    Marital Status: Married    Tobacco Counseling Counseling given: Not Answered    Clinical Intake:  Pre-visit preparation completed: Yes  Pain : No/denies pain  Diabetes: Yes CBG done?: No Did pt. bring in CBG monitor from home?: No; patient states that fasting numbers have been in the 120s to150s   Lab Results  Component  Value Date   HGBA1C 11.5 (H) 09/13/2023   HGBA1C 5.9 (H) 01/28/2023   HGBA1C 6.4 (H) 08/05/2022     How often do you need to have someone help you when you read instructions, pamphlets, or other written materials from your doctor or pharmacy?: 1 - Never  Interpreter Needed?: No  Information entered by :: Charmaine Bloodgood LPN   Activities of Daily Living     02/15/2024    2:09 PM  In your present state of health, do you have any difficulty performing the following activities:  Hearing? 0  Vision? 0  Difficulty concentrating or making decisions? 0  Walking or climbing stairs? 0  Dressing or bathing? 0  Doing errands, shopping? 0  Preparing Food and eating ? N  Using the Toilet? N  In the past six months, have you accidently leaked urine? N  Do you have problems with loss of bowel control? N  Managing your Medications? N  Managing your Finances? N  Housekeeping or managing your Housekeeping? N    Patient Care Team: Duanne Butler DASEN, MD as PCP - General (Family Medicine) Hobart Powell BRAVO, MD (Inactive) as PCP - Cardiology (Cardiology) Center, [facility] Mammography, [place] (Diagnostic Radiology)  I have updated your Care Teams any recent Medical Services you may have received from other providers in the past year.     Assessment:   This is a routine wellness examination for [place].  Hearing/Vision screen Hearing Screening - Comments:: Denies hearing difficulties   Vision Screening - Comments:: Wears rx glasses - up to date with routine eye exams with [facility]    Goals Addressed             This Visit's Progress    Remain active and independent   On track      Depression Screen     02/15/2024    2:06 PM 02/10/2023    3:53 PM 08/05/2022    2:54 PM 12/15/2021    2:59 PM 02/19/2021    9:21 PM 11/22/2019    3:05 PM 08/11/2018    3:05 PM  PHQ 2/9 Scores  PHQ - 2 Score  0 0 0 0  0 0  PHQ- 9 Score    0        Information is confidential and restricted. Go  to Review Flowsheets to unlock data.    Fall Risk     02/15/2024    2:09 PM 02/10/2023    3:58 PM 08/05/2022    2:54 PM 12/15/2021    2:55 PM 08/11/2020    3:11 PM  Fall Risk   Falls in the past year? 0 0 1 1 0  Number falls in past yr: 0 0 1 1 0  Injury with Fall? 0 0 1 1 0  Comment    broke-left side, steel foot put in with 6 screws, 3/23, trip over her foot coming out of the bathroom   Risk for fall due to : No Fall Risks Impaired mobility History of fall(s);Impaired balance/gait;Orthopedic patient    Follow up Falls prevention discussed;Education provided;Falls evaluation completed Falls prevention discussed;Education provided;Falls evaluation completed Education provided;Falls prevention discussed      MEDICARE RISK AT HOME:  Medicare Risk at Home Any stairs in or around the home?: No If so, are there any without handrails?: No Home free of loose throw rugs in walkways, pet beds, electrical cords, etc?: Yes Adequate lighting in your home to reduce risk of falls?: Yes Life alert?: No Use of a cane, walker or w/c?: No Grab bars in the bathroom?: Yes Shower chair or bench in shower?: No Elevated toilet seat or a handicapped toilet?: Yes  TIMED UP AND GO:  Was the test performed?  No  Cognitive Function: Declined/Normal: No cognitive concerns noted by patient or family. Patient alert, oriented, able to answer questions appropriately and recall recent events. No signs of memory loss or confusion.    12/16/2020    2:00 PM  MMSE - Mini Mental State Exam  Orientation to time 5  Orientation to Place 5  Registration 3  Attention/ Calculation 4  Recall 3  Language- name 2 objects 2  Language- repeat 1  Language- follow 3 step command 3  Language- read & follow direction 1  Write a sentence 1  Copy design 1  Total score 29        02/10/2023    3:58 PM 08/11/2020    3:11 PM  6CIT Screen  What Year? 0 points 0 points  What month? 0 points 0 points  What time? 0 points 0  points  Count back from 20 0 points 0 points  Months in reverse 0 points 0 points  Repeat phrase 0 points 0 points  Total Score 0 points 0 points    Immunizations Immunization History  Administered Date(s) Administered   Fluad Quad(high Dose 65+) 05/03/2019, 03/27/2022   Influenza Split 05/31/2011   Influenza, High Dose Seasonal PF 04/30/2013, 03/17/2017   Influenza,inj,Quad PF,6+ Mos 04/21/2015, 05/25/2016   PFIZER(Purple Top)SARS-COV-2 Vaccination 07/18/2019, 08/08/2019   Pneumococcal Conjugate-13 08/08/2013, 10/18/2013   Pneumococcal Polysaccharide-23 05/31/2011   Zoster Recombinant(Shingrix) 11/22/2020, 02/04/2021    Screening Tests Health Maintenance  Topic Date Due   COVID-19 Vaccine (3 - Pfizer risk series) 09/05/2019   MAMMOGRAM  06/09/2021   OPHTHALMOLOGY EXAM  10/29/2021   FOOT EXAM  12/16/2022   INFLUENZA VACCINE  01/27/2024   HEMOGLOBIN A1C  03/15/2024   Diabetic kidney evaluation - eGFR measurement  09/12/2024   Diabetic kidney evaluation - Urine ACR  09/12/2024   Medicare Annual Wellness (AWV)  02/14/2025   Pneumococcal Vaccine: 50+ Years  Completed  DEXA SCAN  Completed   Zoster Vaccines- Shingrix  Completed   HPV VACCINES  Aged Out   Meningococcal B Vaccine  Aged Out   DTaP/Tdap/Td  Discontinued   Colonoscopy  Discontinued   Hepatitis C Screening  Discontinued    Health Maintenance  Health Maintenance Due  Topic Date Due   COVID-19 Vaccine (3 - Pfizer risk series) 09/05/2019   MAMMOGRAM  06/09/2021   OPHTHALMOLOGY EXAM  10/29/2021   FOOT EXAM  12/16/2022   INFLUENZA VACCINE  01/27/2024   Health Maintenance Items Addressed: Requesting records for last diabetic eye exam    Additional Screening:  Vision Screening: Recommended annual ophthalmology exams for early detection of glaucoma and other disorders of the eye. Would you like a referral to an eye doctor? No    Dental Screening: Recommended annual dental exams for proper oral  hygiene  Community Resource Referral / Chronic Care Management: CRR required this visit?  No   CCM required this visit?  No   Plan:    I have personally reviewed and noted the following in the patient's chart:   Medical and social history Use of alcohol, tobacco or illicit drugs  Current medications and supplements including opioid prescriptions. Patient is not currently taking opioid prescriptions. Functional ability and status Nutritional status Physical activity Advanced directives List of other physicians Hospitalizations, surgeries, and ER visits in previous 12 months Vitals Screenings to include cognitive, depression, and falls Referrals and appointments  In addition, I have reviewed and discussed with patient certain preventive protocols, quality metrics, and best practice recommendations. A written personalized care plan for preventive services as well as general preventive health recommendations were provided to patient.   Lavelle Pfeiffer [place], [place]   [id number]/7974   After Visit Summary: (MyChart) Due to this being a telephonic visit, the after visit summary with patients personalized plan was offered to patient via MyChart   Notes: Nothing significant to report at this time.

## 2024-02-15 NOTE — Patient Instructions (Addendum)
 Kathryn Wall , Thank you for taking time out of your busy schedule to complete your Annual Wellness Visit with me. I enjoyed our conversation and look forward to speaking with you again next year. I, as well as your care team,  appreciate your ongoing commitment to your health goals. Please review the following plan we discussed and let me know if I can assist you in the future. Your Game plan/ To Do List      Follow up Visits: We will see or speak with you next year for your Next Medicare AWV with our clinical staff Have you seen your provider in the last 6 months (3 months if uncontrolled diabetes)? Yes  Clinician Recommendations:  Aim for 30 minutes of exercise or brisk walking, 6-8 glasses of water, and 5 servings of fruits and vegetables each day.       This is a list of the screenings recommended for you:  Health Maintenance  Topic Date Due   COVID-19 Vaccine (3 - Pfizer risk series) 09/05/2019   Mammogram  06/09/2021   Eye exam for diabetics  10/29/2021   Complete foot exam   12/16/2022   Flu Shot  01/27/2024   Hemoglobin A1C  03/15/2024   Yearly kidney function blood test for diabetes  09/12/2024   Yearly kidney health urinalysis for diabetes  09/12/2024   Medicare Annual Wellness Visit  02/14/2025   Pneumococcal Vaccine for age over 51  Completed   DEXA scan (bone density measurement)  Completed   Zoster (Shingles) Vaccine  Completed   HPV Vaccine  Aged Out   Meningitis B Vaccine  Aged Out   DTaP/Tdap/Td vaccine  Discontinued   Colon Cancer Screening  Discontinued   Hepatitis C Screening  Discontinued    Advanced directives: (In Chart) A copy of your advanced directives are scanned into your chart should your provider ever need it.  Advance Care Planning is important because it:  [x]  Makes sure you receive the medical care that is consistent with your values, goals, and preferences  [x]  It provides guidance to your family and loved ones and reduces their decisional  burden about whether or not they are making the right decisions based on your wishes.  Follow the link provided in your after visit summary or read over the paperwork we have mailed to you to help you started getting your Advance Directives in place. If you need assistance in completing these, please reach out to us  so that we can help you!  See attachments for Preventive Care and Fall Prevention Tips.

## 2024-02-16 ENCOUNTER — Ambulatory Visit: Payer: Medicare Other

## 2024-02-28 ENCOUNTER — Encounter: Payer: Self-pay | Admitting: Family Medicine

## 2024-02-28 ENCOUNTER — Ambulatory Visit: Admitting: Family Medicine

## 2024-02-28 VITALS — BP 132/88 | HR 107 | Temp 98.4°F | Ht 65.0 in | Wt 174.1 lb

## 2024-02-28 DIAGNOSIS — M7989 Other specified soft tissue disorders: Secondary | ICD-10-CM

## 2024-02-28 DIAGNOSIS — I4891 Unspecified atrial fibrillation: Secondary | ICD-10-CM

## 2024-02-28 DIAGNOSIS — R0602 Shortness of breath: Secondary | ICD-10-CM

## 2024-02-28 DIAGNOSIS — R000 Tachycardia, unspecified: Secondary | ICD-10-CM | POA: Diagnosis not present

## 2024-02-28 NOTE — Progress Notes (Unsigned)
 Patient Office Visit  Assessment & Plan:  Swelling of thigh -     VAS US  LOWER EXTREMITY VENOUS (DVT); Future  Atrial fibrillation, unspecified type (HCC) -     CBC with Differential/Platelet -     Comprehensive metabolic panel with GFR -     TSH  Tachycardia -     CBC with Differential/Platelet -     Comprehensive metabolic panel with GFR -     Hemoglobin A1c -     Lipid panel -     TSH  Shortness of breath -     Brain natriuretic peptide -     DG Chest 2 View; Future   Assessment and Plan    Unilateral lower extremity swelling and pain Acute swelling and pain in the lower extremity for 48 hours. Possible DVT despite Eliquis  for atrial fibrillation. Severe pain causing nausea and sleep disruption. Sedentary lifestyle may contribute. - Order venous Doppler ultrasound of the affected leg to rule out DVT. - Advise elevating the leg to reduce swelling. - Recommend taking acetaminophen  for pain management.  Paroxysmal atrial fibrillation Managed with Eliquis  to prevent stroke. Previously stopped Eliquis  due to cost, restarted in April.  Type 2 diabetes mellitus Poorly controlled diabetes with previous A1c indicating poor control. Blood sugar improved from 650 mg/dL to 879 mg/dL. No recent blood work in six months. - Order blood work to assess current diabetes control.  Chronic dyspnea Chronic dyspnea for over two months, recently worsening. Oxygen saturation at 95%. Lungs clear, no wheezing. Possible deconditioning or pulmonary embolism if DVT confirmed. - Consider further investigation of the lungs if DVT is confirmed.     Follow up on lab work and notify patient/daughter. Will add BNP to make sure she is not in heart failure due to recent weight gain.  Will also order chest x-ray when she does her venous doppler. Patient aware to go to ED if symptoms worsen in any way.      Return if symptoms worsen or fail to improve.   Subjective:    Patient ID: Kathryn Wall, female    DOB: Feb 13, 1946  Age: 78 y.o. MRN: [id number]  Chief Complaint  Patient presents with   Leg Swelling    Swelling to the R thigh x 1 day.    HPI Discussed the use of AI scribe software for clinical note transcription with the patient, who gave verbal consent to proceed.  History of Present Illness   Kathryn Wall is an 78 year old female with parosysmal atrial fibrillation and uncontrolled type 2 diabetes who presents with right thigh leg swelling and shortness of breath.  She has been experiencing significant swelling in her right upper thigh since yesterday, causing discomfort and disrupting her sleep. The swelling is occasionally painful to the touch, though the pain has decreased since it began. No history of blood clots DVTs or PE but has Afib  She has been experiencing shortness of breath for the past month, describing it as a change in her breathing pattern. She has a history of atrial fibrillation and is currently taking Eliquis , which she restarted in April after stopping it in March due to cost concerns. No history of smoking or exposure to smoke. She has not been around anyone with COVID-19 or other infections. Occasional coughing, particularly when lying down. No fever, chills, or wheezing.  Her past medical history includes diabetes, for which she is on insulin  therapy. Her blood sugar was previously very high  at 650 mg/dL, but it has since decreased to around 120 mg/dL. She has not had blood work done in the past six months.  She has a history of breast cancer and sepsis, which she attributes to food poisoning from a barbecue. She does not recall when her atrial fibrillation was first identified, but it may have been during a hospital stay about three years ago, around the time of her father's death.  She leads a sedentary lifestyle, primarily sitting throughout the day, and acknowledges that she does not get much exercise. She lives alone and has been feeling  nauseous, which she attributes to not eating much due to the pain and discomfort from her leg swelling.     Patient does not have previous history of heart failure, last ECHO done in 2022 with normal EF. Of note patient does live alone VITALS: BP- 199/, SaO2- 95% CHEST: Lungs clear to auscultation bilaterally. Results LABS Blood glucose: 650 (09/2023) Assessment & Plan Unilateral lower extremity swelling and pain Acute swelling and pain in the lower extremity for 48 hours. Possible DVT despite Eliquis  for atrial fibrillation. Severe pain causing nausea and sleep disruption. Sedentary lifestyle may contribute. - Order venous Doppler ultrasound of the affected leg to rule out DVT. - Advise elevating the leg to reduce swelling. - Recommend taking acetaminophen  for pain management.  Paroxysmal atrial fibrillation Managed with Eliquis  to prevent stroke. Previously stopped Eliquis  due to cost, restarted in April.  Type 2 diabetes mellitus Poorly controlled diabetes with previous A1c indicating poor control. Blood sugar improved from 650 mg/dL to 879 mg/dL. No recent blood work in six months. - Order blood work to assess current diabetes control.  Chronic dyspnea Chronic dyspnea for over two months, recently worsening. Oxygen saturation at 95%. Lungs clear, no wheezing. Possible deconditioning or pulmonary embolism if DVT confirmed. - Consider further investigation of the lungs if DVT is confirmed. -due to weight gain I will order BNP (patient does not have previous history of heart failure)   The ASCVD Risk score (Arnett DK, et al., 2019) failed to calculate for the following reasons:   The 2019 ASCVD risk score is only valid for ages 2 to 34  Past Medical History:  Diagnosis Date   Anxiety    Breast cancer (HCC) 06/29/1995   AGE 69, BRCA 1 NEGATIVE 2. 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    CKD (chronic kidney disease), stage III (HCC)    Cognitive deficits    mild    Depression    Diabetes mellitus    TYPE II   High cholesterol    Hypertension    PAF (paroxysmal atrial fibrillation) (HCC)    Past Surgical History:  Procedure Laterality Date   ABDOMINAL HYSTERECTOMY  06/29/1983   TAH   ANKLE SURGERY Right    APPLICATION OF WOUND VAC Left 01/14/2022   Procedure: APPLICATION OF WOUND VAC;  Surgeon: Lowery Estefana RAMAN, DO;  Location: [facility] OR;  Service: Plastics;  Laterality: Left;   BREAST SURGERY  06/29/1995   RIGHT BREAST LUMPECTOMY   CHOLECYSTECTOMY  06/28/1978   COLONOSCOPY     Social History   Tobacco Use   Smoking status: Never   Smokeless tobacco: Never  Vaping Use   Vaping status: Never Used  Substance Use Topics   Alcohol use: No   Drug use: No   Family History  Problem Relation Age of Onset   Cancer Mother        COLON  Hypertension Father    Heart disease Father    Allergies  Allergen Reactions   Ambien  [Zolpidem  Tartrate] Other (See Comments)    Sleep walking   Cipro  [Ciprofloxacin  Hcl] Nausea Only   Cymbalta [Duloxetine Hcl] Other (See Comments)    Unknown Reaction   Other     SENSITIVE TO ANTIBIOTICS   Cardura  [Doxazosin ] Nausea And Vomiting   Percocet [Oxycodone -Acetaminophen ] Nausea And Vomiting    ROS    Objective:    BP 132/88   Pulse (!) 107   Temp 98.4 F (36.9 C)   Ht 5' 5 (1.651 m)   Wt 174 lb 2 oz (79 kg)   SpO2 95%   BMI 28.98 kg/m  BP Readings from Last 3 Encounters:  02/28/24 132/88  09/27/23 124/72  09/13/23 130/76   Wt Readings from Last 3 Encounters:  02/28/24 174 lb 2 oz (79 kg)  02/15/24 156 lb (70.8 kg)  09/27/23 156 lb (70.8 kg)    Physical Exam Vitals and nursing note reviewed.  Constitutional:      General: She is not in acute distress.    Appearance: Normal appearance.     Comments: Comes in with her daughter, using walker  HENT:     Head: Normocephalic.     Right Ear: Tympanic membrane, ear canal and external ear normal.     Left Ear: Tympanic membrane, ear  canal and external ear normal.  Eyes:     Extraocular Movements: Extraocular movements intact.     Pupils: Pupils are equal, round, and reactive to light.  Cardiovascular:     Rate and Rhythm: Normal rate and regular rhythm.     Heart sounds: Normal heart sounds.  Pulmonary:     Effort: Pulmonary effort is normal.     Breath sounds: Normal breath sounds.  Musculoskeletal:     Right lower leg: Edema present.     Left lower leg: No edema.     Comments: Right thigh swelling with increase in diameter compared to left thigh,  no redness, not warm to the touch. No calf tenderness to palpation.   Neurological:     General: No focal deficit present.     Mental Status: She is alert and oriented to person, place, and time.  Psychiatric:        Mood and Affect: Mood normal.        Behavior: Behavior normal.        Thought Content: Thought content normal.        Judgment: Judgment normal.      Results for orders placed or performed in visit on 02/28/24  CBC with Differential/Platelet  Result Value Ref Range   WBC 15.2 (H) 3.8 - 10.8 Thousand/uL   RBC 4.43 3.80 - 5.10 Million/uL   Hemoglobin 12.9 11.7 - 15.5 g/dL   HCT 60.6 64.9 - 54.9 %   MCV 88.7 80.0 - 100.0 fL   MCH 29.1 27.0 - 33.0 pg   MCHC 32.8 32.0 - 36.0 g/dL   RDW 86.0 88.9 - 84.9 %   Platelets 331 140 - 400 Thousand/uL   MPV 12.4 7.5 - 12.5 fL   Neutro Abs 11,218 (H) 1,500 - 7,800 cells/uL   Absolute Lymphocytes 2,675 850 - 3,900 cells/uL   Absolute Monocytes 1,155 (H) 200 - 950 cells/uL   Eosinophils Absolute 76 15 - 500 cells/uL   Basophils Absolute 76 0 - 200 cells/uL   Neutrophils Relative % 73.8 %   Total Lymphocyte 17.6 %  Monocytes Relative 7.6 %   Eosinophils Relative 0.5 %   Basophils Relative 0.5 %  Comprehensive metabolic panel with GFR  Result Value Ref Range   Glucose, Bld 326 (H) 65 - 99 mg/dL   BUN 21 7 - 25 mg/dL   Creat 8.78 (H) 9.39 - 0.95 mg/dL   eGFR 45 (L) > OR = 60 mL/min/1.92m2    BUN/Creatinine Ratio 17 6 - 22 (calc)   Sodium 135 135 - 146 mmol/L   Potassium 5.0 3.5 - 5.3 mmol/L   Chloride 100 98 - 110 mmol/L   CO2 22 20 - 32 mmol/L   Calcium  9.5 8.6 - 10.4 mg/dL   Total Protein 7.2 6.1 - 8.1 g/dL   Albumin 4.4 3.6 - 5.1 g/dL   Globulin 2.8 1.9 - 3.7 g/dL (calc)   AG Ratio 1.6 1.0 - 2.5 (calc)   Total Bilirubin 0.9 0.2 - 1.2 mg/dL   Alkaline phosphatase (APISO) 90 37 - 153 U/L   AST 17 10 - 35 U/L   ALT 21 6 - 29 U/L  Hemoglobin A1c  Result Value Ref Range   Hgb A1c MFr Bld 9.9 (H) <5.7 %   Mean Plasma Glucose 237 mg/dL   eAG (mmol/L) 86.7 mmol/L  Lipid panel  Result Value Ref Range   Cholesterol 167 <200 mg/dL   HDL 56 > OR = 50 mg/dL   Triglycerides 772 (H) <150 mg/dL   LDL Cholesterol (Calc) 79 mg/dL (calc)   Total CHOL/HDL Ratio 3.0 <5.0 (calc)   Non-HDL Cholesterol (Calc) 111 <130 mg/dL (calc)  TSH  Result Value Ref Range   TSH 3.35 0.40 - 4.50 mIU/L

## 2024-02-29 ENCOUNTER — Encounter: Payer: Self-pay | Admitting: Family Medicine

## 2024-02-29 ENCOUNTER — Ambulatory Visit: Payer: Self-pay | Admitting: Family Medicine

## 2024-02-29 ENCOUNTER — Ambulatory Visit: Payer: Self-pay

## 2024-02-29 ENCOUNTER — Telehealth: Payer: Self-pay

## 2024-02-29 ENCOUNTER — Ambulatory Visit
Admission: RE | Admit: 2024-02-29 | Discharge: 2024-02-29 | Disposition: A | Discharge status: Home / Self Care | Source: Ambulatory Visit | Attending: Family Medicine | Admitting: Family Medicine

## 2024-02-29 DIAGNOSIS — M7989 Other specified soft tissue disorders: Secondary | ICD-10-CM

## 2024-02-29 DIAGNOSIS — R0602 Shortness of breath: Secondary | ICD-10-CM

## 2024-02-29 LAB — HEMOGLOBIN A1C
Hgb A1c MFr Bld: 9.9 % — ABNORMAL HIGH (ref <5.7)
Mean Plasma Glucose: 237 mg/dL
eAG (mmol/L): 13.2 mmol/L

## 2024-02-29 LAB — LIPID PANEL
Cholesterol: 167 mg/dL (ref <200)
HDL: 56 mg/dL (ref >=50)
LDL Cholesterol (Calc): 79 mg/dL
Non-HDL Cholesterol (Calc): 111 mg/dL (ref <130)
Total CHOL/HDL Ratio: 3 (calc) (ref <5.0)
Triglycerides: 227 mg/dL — ABNORMAL HIGH (ref <150)

## 2024-02-29 LAB — CBC WITH DIFFERENTIAL/PLATELET
Absolute Lymphocytes: 2675 {cells}/uL (ref 850–3900)
Absolute Monocytes: 1155 {cells}/uL — ABNORMAL HIGH (ref 200–950)
Basophils Absolute: 76 {cells}/uL (ref 0–200)
Basophils Relative: 0.5 %
Eosinophils Absolute: 76 {cells}/uL (ref 15–500)
Eosinophils Relative: 0.5 %
HCT: 39.3 % (ref 35.0–45.0)
Hemoglobin: 12.9 g/dL (ref 11.7–15.5)
MCH: 29.1 pg (ref 27.0–33.0)
MCHC: 32.8 g/dL (ref 32.0–36.0)
MCV: 88.7 fL (ref 80.0–100.0)
MPV: 12.4 fL (ref 7.5–12.5)
Monocytes Relative: 7.6 %
Neutro Abs: 11218 {cells}/uL — ABNORMAL HIGH (ref 1500–7800)
Neutrophils Relative %: 73.8 %
Platelets: 331 Thousand/uL (ref 140–400)
RBC: 4.43 Million/uL (ref 3.80–5.10)
RDW: 13.9 % (ref 11.0–15.0)
Total Lymphocyte: 17.6 %
WBC: 15.2 Thousand/uL — ABNORMAL HIGH (ref 3.8–10.8)

## 2024-02-29 LAB — COMPREHENSIVE METABOLIC PANEL WITH GFR
AG Ratio: 1.6 (calc) (ref 1.0–2.5)
ALT: 21 U/L (ref 6–29)
AST: 17 U/L (ref 10–35)
Albumin: 4.4 g/dL (ref 3.6–5.1)
Alkaline phosphatase (APISO): 90 U/L (ref 37–153)
BUN/Creatinine Ratio: 17 (calc) (ref 6–22)
BUN: 21 mg/dL (ref 7–25)
CO2: 22 mmol/L (ref 20–32)
Calcium: 9.5 mg/dL (ref 8.6–10.4)
Chloride: 100 mmol/L (ref 98–110)
Creat: 1.21 mg/dL — ABNORMAL HIGH (ref 0.60–0.95)
Globulin: 2.8 g/dL (ref 1.9–3.7)
Glucose, Bld: 326 mg/dL — ABNORMAL HIGH (ref 65–99)
Potassium: 5 mmol/L (ref 3.5–5.3)
Sodium: 135 mmol/L (ref 135–146)
Total Bilirubin: 0.9 mg/dL (ref 0.2–1.2)
Total Protein: 7.2 g/dL (ref 6.1–8.1)
eGFR: 45 mL/min/1.73m2 — ABNORMAL LOW (ref >=60)

## 2024-02-29 LAB — TSH: TSH: 3.35 m[IU]/L (ref 0.40–4.50)

## 2024-02-29 LAB — BRAIN NATRIURETIC PEPTIDE: Brain Natriuretic Peptide: 21 pg/mL (ref <100)

## 2024-02-29 NOTE — Telephone Encounter (Signed)
 Copied from CRM [id number]. Topic: Clinical - Lab/Test Results >> Feb 29, 2024  3:33 PM Kathryn Wall wrote: Reason for CRM: Patient had imaging done today and is requesting that once the results are in that she be contacted by Dr Duanne or his nurse since that is her PCP.  Patient can be reached at [phone]

## 2024-02-29 NOTE — Telephone Encounter (Signed)
 FYI Only or Action Required?: FYI only for provider.  Patient was last seen in primary care on 02/28/2024 by Aletha Bene, MD.  Called Nurse Triage reporting Leg Swelling.  Symptoms began yesterday.  Interventions attempted: OTC medications: Tylenol .  Symptoms are: unchanged.  Triage Disposition: See PCP When Office is Open (Within 3 Days) (overriding Call PCP Within 24 Hours)  Patient/caregiver understands and will follow disposition?: Yes   Copied from CRM #[id number]. Topic: Clinical - Red Word Triage >> Feb 29, 2024  4:40 PM Tobias L wrote: Red Word that prompted transfer to Nurse Triage: requesting appointment due to swelling Reason for Disposition  [1] Caller has NON-URGENT question (includes prescribed medication questions) AND [2] triager unable to answer  Answer Assessment - Initial Assessment Questions Patient states was seen by Dr. Aletha yesterday and she went to have the chest x-ray and ultrasound done, all negative for a blood clot. She says the swelling is still in the right knee up to thigh and she still has pain. She would like to see Dr. Duanne tomorrow, scheduled.    1. MAIN CONCERN OR SYMPTOM:  What is your main concern right now? What question do you have? What's the main symptom you're worried about? (e.g., breathing difficulty, cough, fever, pain)     Swelling, pain in the right leg  2. ONSET: When did the  swelling  start?     Yesterday  3. BETTER-SAME-WORSE: Are you getting better, staying the same, or getting worse compared to how you felt at your last visit to the doctor (most recent medical visit)?     Same  4. VISIT DATE: When were you seen? (e.g., date)     02/28/24  5. VISIT DOCTOR: What is the name of the doctor taking care of you now?     Dr. Aletha  6. VISIT DIAGNOSIS:  What was the main symptom or problem that you were seen for? Were you given a diagnosis?      SOB, leg swelling    7. PAIN: Is there any pain? If Yes,  ask: How bad is it?  (Scale 0-10; or none, mild, moderate, severe)     6-7  8 FEVER: Do you have a fever? If Yes, ask: What is it, how was it measured  and when did it start?       No  11. OTHER SYMPTOMS: Do you have any other symptoms?       No  Protocols used: Recent Medical Visit for Illness Follow-up Call-A-[facility]

## 2024-03-01 ENCOUNTER — Encounter: Payer: Self-pay | Admitting: Family Medicine

## 2024-03-01 ENCOUNTER — Ambulatory Visit: Admitting: Family Medicine

## 2024-03-01 VITALS — BP 126/62 | HR 100 | Temp 98.7°F | Ht 65.0 in | Wt 177.2 lb

## 2024-03-01 DIAGNOSIS — M79651 Pain in right thigh: Secondary | ICD-10-CM | POA: Diagnosis not present

## 2024-03-01 DIAGNOSIS — E1122 Type 2 diabetes mellitus with diabetic chronic kidney disease: Secondary | ICD-10-CM | POA: Diagnosis not present

## 2024-03-01 DIAGNOSIS — N1831 Chronic kidney disease, stage 3a: Secondary | ICD-10-CM | POA: Diagnosis not present

## 2024-03-01 DIAGNOSIS — Z800 Family history of malignant neoplasm of digestive organs: Secondary | ICD-10-CM | POA: Insufficient documentation

## 2024-03-01 DIAGNOSIS — D72829 Elevated white blood cell count, unspecified: Secondary | ICD-10-CM | POA: Diagnosis not present

## 2024-03-01 DIAGNOSIS — Z794 Long term (current) use of insulin: Secondary | ICD-10-CM

## 2024-03-01 MED ORDER — LANTUS SOLOSTAR 100 UNIT/ML ~~LOC~~ SOPN: 30.0000 [IU] | PEN_INJECTOR | Freq: Two times a day (BID) | SUBCUTANEOUS | 99 refills | Status: DC

## 2024-03-01 MED ORDER — CEPHALEXIN 500 MG PO CAPS: 500.0000 mg | ORAL_CAPSULE | Freq: Three times a day (TID) | ORAL | 0 refills | Status: AC

## 2024-03-01 NOTE — Telephone Encounter (Signed)
Pt has an appt today with PCP.

## 2024-03-01 NOTE — Progress Notes (Signed)
 Subjective:    Patient ID: Kathryn Wall, female    DOB: 03-30-1946, 78 y.o.   MRN: [id number]  HgA1c is 9.9.  Currently on lantus  44 units daily.  Not taking metformin . Is on jardiance .  Reports pain and swelling in right upper lateral thigh.  Tense and sore.  No hematoma.  No visible cellulitis.  Venous us  negative for dvt.  CXR clear.  BNP normal.  Does have polyuria and urgency but on jardiance  with sugars >300.   Past Medical History:  Diagnosis Date   Anxiety    Breast cancer (HCC) 06/29/1995   AGE 44, BRCA 1 NEGATIVE 2. 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    CKD (chronic kidney disease), stage III (HCC)    Cognitive deficits    mild   Depression    Diabetes mellitus    TYPE II   High cholesterol    Hypertension    PAF (paroxysmal atrial fibrillation) [facility])    Past Surgical History:  Procedure Laterality Date   ABDOMINAL HYSTERECTOMY  06/29/1983   TAH   ANKLE SURGERY Right    APPLICATION OF WOUND VAC Left 01/14/2022   Procedure: APPLICATION OF WOUND VAC;  Surgeon: Lowery Estefana RAMAN, DO;  Location: [facility] OR;  Service: Plastics;  Laterality: Left;   BREAST SURGERY  06/29/1995   RIGHT BREAST LUMPECTOMY   CHOLECYSTECTOMY  06/28/1978   COLONOSCOPY     Current Outpatient Medications on File Prior to Visit  Medication Sig Dispense Refill   acetaminophen  (TYLENOL ) 500 MG tablet Take 1,000 mg by mouth daily as needed for moderate pain, fever or headache.     amLODipine  (NORVASC ) 5 MG tablet TAKE 1 TABLET BY MOUTH ONCE A DAY 30 tablet 10   apixaban  (ELIQUIS ) 5 MG TABS tablet TAKE ONE TABLET BY MOUTH TWICE A DAY 180 tablet 2   atorvastatin  (LIPITOR) 40 MG tablet TAKE ONE TABLET BY MOUTH ONCE A DAY 90 tablet 2   Continuous Glucose Sensor (DEXCOM G7 SENSOR) MISC CHANGE SENSOR EVERY TEN DAYS. 3 each 12   Insulin  Pen Needle 31G X 8 MM MISC USE TO ADMINISTER INSULIN  DAILY. 100 each 3   JARDIANCE  25 MG TABS tablet TAKE ONE TABLET BY MOUTH ONCE A DAY 90 tablet 0    losartan  (COZAAR ) 50 MG tablet TAKE ONE TABLET BY MOUTH ONCE A DAY 100 tablet 0   metoprolol  succinate (TOPROL -XL) 25 MG 24 hr tablet TAKE 1 TABLET BY MOUTH ONCE A DAY 30 tablet 10   Multiple Vitamins-Minerals (ONE A DAY WOMEN 50 PLUS) CHEW Chew 1 each by mouth daily.     MYRBETRIQ  25 MG TB24 tablet TAKE ONE TABLET BY MOUTH ONCE A DAY 90 tablet 1   ondansetron  (ZOFRAN -ODT) 4 MG disintegrating tablet Take 4 mg by mouth every 8 (eight) hours as needed for nausea or vomiting.     ondansetron  (ZOFRAN -ODT) 4 MG disintegrating tablet Take 1 tablet (4 mg total) by mouth every 8 (eight) hours as needed for nausea or vomiting. 12 tablet 0   pantoprazole  (PROTONIX ) 40 MG tablet TAKE ONE TABLET BY MOUTH EVERY MORNING 90 tablet 2   PARoxetine  (PAXIL ) 20 MG tablet TAKE 1 TABLET BY MOUTH ONCE A DAY 90 tablet 1   QUEtiapine  (SEROQUEL ) 50 MG tablet TAKE ONE TABLET BY MOUTH ONCE A DAY 30 tablet 0   traZODone  (DESYREL ) 50 MG tablet TAKE ONE TABLET BY MOUTH EVERY NIGHT AT BEDTIME 90 tablet 1   venlafaxine  XR (EFFEXOR -XR) 75 MG  24 hr capsule Take 1 capsule (75 mg total) by mouth daily with breakfast. 90 capsule 1   metFORMIN  (GLUCOPHAGE -XR) 500 MG 24 hr tablet TAKE FOUR TABLETS BY MOUTH ONCE DAILY WITH BREAKFAST. (Patient not taking: Reported on 02/28/2024) 360 tablet 1   No current facility-administered medications on file prior to visit.   Allergies  Allergen Reactions   Ambien  [Zolpidem  Tartrate] Other (See Comments)    Sleep walking   Cipro  [Ciprofloxacin  Hcl] Nausea Only   Cymbalta [Duloxetine Hcl] Other (See Comments)    Unknown Reaction   Other     SENSITIVE TO ANTIBIOTICS   Cardura  [Doxazosin ] Nausea And Vomiting   Percocet [Oxycodone -Acetaminophen ] Nausea And Vomiting   Social History   Socioeconomic History   Marital status: Married    Spouse name: Not on file   Number of children: Not on file   Years of education: Not on file   Highest education level: Not on file  Occupational History   Not  on file  Tobacco Use   Smoking status: Never   Smokeless tobacco: Never  Vaping Use   Vaping status: Never Used  Substance and Sexual Activity   Alcohol use: No   Drug use: No   Sexual activity: Yes    Birth control/protection: Post-menopausal, Surgical    Comment: hysterectomy  Other Topics Concern   Not on file  Social History Narrative   Not on file   Social Drivers of Health   Financial Resource Strain: Low Risk  (02/15/2024)   Overall Financial Resource Strain (CARDIA)    Difficulty of Paying Living Expenses: Not hard at all  Food Insecurity: No Food Insecurity (02/15/2024)   Hunger Vital Sign    Worried About Running Out of Food in the Last Year: Never true    Ran Out of Food in the Last Year: Never true  Transportation Needs: No Transportation Needs (02/15/2024)   PRAPARE - Administrator, Civil Service (Medical): No    Lack of Transportation (Non-Medical): No  Physical Activity: Insufficiently Active (02/15/2024)   Exercise Vital Sign    Days of Exercise per Week: 3 days    Minutes of Exercise per Session: 30 min  Stress: No Stress Concern Present (02/15/2024)   [employer] of Occupational Health - Occupational Stress Questionnaire    Feeling of Stress: Not at all  Social Connections: Moderately Integrated (02/15/2024)   Social Connection and Isolation Panel    Frequency of Communication with Friends and Family: More than three times a week    Frequency of Social Gatherings with Friends and Family: Three times a week    Attends Religious Services: 1 to 4 times per year    Active Member of Clubs or Organizations: No    Attends Banker Meetings: Never    Marital Status: Married  Catering manager Violence: Not At Risk (02/15/2024)   Humiliation, Afraid, Rape, and Kick questionnaire    Fear of Current or Ex-Partner: No    Emotionally Abused: No    Physically Abused: No    Sexually Abused: No   Family History  Problem Relation Age of  Onset   Cancer Mother        COLON   Hypertension Father    Heart disease Father       Review of Systems  All other systems reviewed and are negative.      Objective:   Physical Exam Vitals reviewed.  Constitutional:      General: She is  not in acute distress.    Appearance: She is well-developed. She is not diaphoretic.  HENT:     Head: Normocephalic and atraumatic.     Right Ear: External ear normal.     Left Ear: External ear normal.     Nose: Nose normal.     Mouth/Throat:     Pharynx: No oropharyngeal exudate.  Eyes:     General: No scleral icterus.       Right eye: No discharge.        Left eye: No discharge.     Conjunctiva/sclera: Conjunctivae normal.     Pupils: Pupils are equal, round, and reactive to light.  Neck:     Thyroid: No thyromegaly.     Trachea: No tracheal deviation.  Cardiovascular:     Rate and Rhythm: Normal rate and regular rhythm.     Heart sounds: Normal heart sounds. No murmur heard.    No friction rub. No gallop.  Pulmonary:     Effort: Pulmonary effort is normal. No respiratory distress.     Breath sounds: Normal breath sounds. No stridor. No wheezing or rales.  Abdominal:     General: Bowel sounds are normal. There is no distension.     Palpations: Abdomen is soft. There is no mass.     Tenderness: There is no abdominal tenderness. There is no guarding or rebound.  Musculoskeletal:     Cervical back: Normal range of motion and neck supple.       Legs:  Lymphadenopathy:     Cervical: No cervical adenopathy.  Skin:    General: Skin is warm.     Coloration: Skin is not pale.     Findings: No erythema or rash.  Neurological:     Mental Status: She is alert and oriented to person, place, and time.     Cranial Nerves: No cranial nerve deficit.     Motor: No abnormal muscle tone.     Coordination: Coordination normal.     Deep Tendon Reflexes: Reflexes normal.  Psychiatric:        Mood and Affect: Mood normal.        Speech:  Speech normal.        Behavior: Behavior normal.        Thought Content: Thought content normal.        Judgment: Judgment normal.           Assessment & Plan:  Leukocytosis, unspecified type  Type 2 diabetes mellitus with stage 3a chronic kidney disease, without long-term current use of insulin  (HCC) - Plan: insulin  glargine (LANTUS  SOLOSTAR) 100 UNIT/ML Solostar Pen  Pain of right thigh  Increase Lantus  to 30 units bid (60) and uptitrate in 1 weekuntil fbs <130 and 2 hr pps <180.  Given wbc >15 and polyuria and jardiance , concerned about uti.  Also concerned about possible infection in right thigh subcutaneous fat.  Start keflex  500 tid for 7 days and recheck next week.

## 2024-03-08 ENCOUNTER — Other Ambulatory Visit: Payer: Self-pay | Admitting: Family Medicine

## 2024-03-12 ENCOUNTER — Other Ambulatory Visit: Payer: Self-pay | Admitting: Family Medicine

## 2024-03-12 ENCOUNTER — Other Ambulatory Visit: Payer: Self-pay

## 2024-03-12 ENCOUNTER — Telehealth: Payer: Self-pay

## 2024-03-12 DIAGNOSIS — N1831 Chronic kidney disease, stage 3a: Secondary | ICD-10-CM

## 2024-03-12 MED ORDER — LANTUS SOLOSTAR 100 UNIT/ML ~~LOC~~ SOPN: 30.0000 [IU] | PEN_INJECTOR | Freq: Two times a day (BID) | SUBCUTANEOUS | 99 refills | Status: DC

## 2024-03-12 MED ORDER — INSULIN ASPART 100 UNIT/ML IJ SOLN: INTRAMUSCULAR | 99 refills | Status: DC

## 2024-03-12 NOTE — Telephone Encounter (Signed)
 Copied from CRM [id number]. Topic: Clinical - Prescription Issue >> Mar 12, 2024  3:26 PM Rea ORN wrote: Reason for CRM: PT stated PCP told her to increase her insulin  glargine (LANTUS  SOLOSTAR) 100 UNIT/ML Solostar Pen. She takes it twice daily now and needs a refill sent to [facility]. She will leaving to go to beach on Wednesday and needs to pick as soon as possible.

## 2024-03-12 NOTE — Telephone Encounter (Signed)
 Copied from CRM [id number]. Topic: General - Call Back - No Documentation >> Mar 12, 2024  3:23 PM Rea ORN wrote: Reason for CRM: Pt called to speak nurse regarding insuline intake. Please call back [phone]

## 2024-03-13 ENCOUNTER — Other Ambulatory Visit: Payer: Self-pay

## 2024-03-13 ENCOUNTER — Telehealth: Payer: Self-pay

## 2024-03-13 DIAGNOSIS — N1831 Type 2 diabetes mellitus with diabetic chronic kidney disease: Secondary | ICD-10-CM

## 2024-03-13 MED ORDER — NOVOLOG FLEXPEN RELION 100 UNIT/ML ~~LOC~~ SOPN: 5.0000 [IU] | PEN_INJECTOR | Freq: Every day | SUBCUTANEOUS | 11 refills | Status: DC

## 2024-03-13 MED ORDER — PEN NEEDLES 31G X 8 MM MISC: 3 refills | Status: AC

## 2024-03-13 NOTE — Telephone Encounter (Signed)
 Novolog  needs to be pen form and not vial. Pen needles also needed for 3x per day. Mjp,lpn

## 2024-03-27 ENCOUNTER — Other Ambulatory Visit: Payer: Self-pay | Admitting: Family Medicine

## 2024-04-03 ENCOUNTER — Other Ambulatory Visit: Payer: Self-pay | Admitting: Family Medicine

## 2024-04-03 ENCOUNTER — Ambulatory Visit: Admitting: Family Medicine

## 2024-04-03 ENCOUNTER — Other Ambulatory Visit: Payer: Self-pay

## 2024-04-03 MED ORDER — APIXABAN 5 MG PO TABS: 5.0000 mg | ORAL_TABLET | Freq: Two times a day (BID) | ORAL | 2 refills | Status: AC

## 2024-04-03 MED ORDER — QUETIAPINE FUMARATE 50 MG PO TABS: 50.0000 mg | ORAL_TABLET | Freq: Every day | ORAL | 0 refills | Status: DC

## 2024-05-09 ENCOUNTER — Telehealth: Payer: Self-pay

## 2024-05-09 ENCOUNTER — Other Ambulatory Visit: Payer: Self-pay | Admitting: Family Medicine

## 2024-05-09 NOTE — Telephone Encounter (Signed)
 Copied from CRM [id number]. Topic: General - Other >> May 09, 2024  2:23 PM Fonda T wrote: Reason for CRM: Received call from Jordan with [facility], ph [phone], calling to inquire on status of requested office notes on behalf of patient.  States request was sent to office on 04/20/24.  Requesting notes be faxed to [phone] In regards to continuous glucose monitoring.   Per caller, will be faxing another request to office regarding previously requested notes.

## 2024-05-11 ENCOUNTER — Telehealth: Payer: Self-pay | Admitting: Family Medicine

## 2024-05-11 ENCOUNTER — Other Ambulatory Visit: Payer: Self-pay | Admitting: Family Medicine

## 2024-05-11 ENCOUNTER — Other Ambulatory Visit: Payer: Self-pay

## 2024-05-11 ENCOUNTER — Telehealth: Payer: Self-pay

## 2024-05-11 DIAGNOSIS — N1831 Chronic kidney disease, stage 3a: Secondary | ICD-10-CM

## 2024-05-11 MED ORDER — LANTUS SOLOSTAR 100 UNIT/ML ~~LOC~~ SOPN: 30.0000 [IU] | PEN_INJECTOR | Freq: Two times a day (BID) | SUBCUTANEOUS | 99 refills | Status: DC

## 2024-05-11 MED ORDER — NOVOLOG FLEXPEN RELION 100 UNIT/ML ~~LOC~~ SOPN: 5.0000 [IU] | PEN_INJECTOR | Freq: Every day | SUBCUTANEOUS | 11 refills | Status: AC

## 2024-05-11 NOTE — Telephone Encounter (Signed)
 Fax received from [facility] regarding pt's CGM supplies. Completed and faxed back to Jordan at [phone]. Mjp,lpn

## 2024-05-11 NOTE — Telephone Encounter (Signed)
 Copied from CRM #[id number]. Topic: Clinical - Medical Advice >> May 11, 2024 10:02 AM Delon DASEN wrote: Reason for CRM: patient's blood sugar was 157 last night, need to know if she needs to move up the dosage, will need a refill of insulin , may need 2 boxes every month- [phone]

## 2024-05-11 NOTE — Telephone Encounter (Signed)
 Copied from CRM #[id number]. Topic: Clinical - Medication Refill >> May 11, 2024 10:03 AM Delon T wrote: Medication: insulin  glargine (LANTUS  SOLOSTAR) 100 UNIT/ML Solostar Pen  Has the patient contacted their pharmacy? No (Agent: If no, request that the patient contact the pharmacy for the refill. If patient does not wish to contact the pharmacy document the reason why and proceed with request.) (Agent: If yes, when and what did the pharmacy advise?)  This is the patient's preferred pharmacy:  [facility] - [place], [place] - [address] [place] [address] Phone: [phone] Fax: [phone]  Is this the correct pharmacy for this prescription? Yes If no, delete pharmacy and type the correct one.   Has the prescription been filled recently? Yes  Is the patient out of the medication? No  Has the patient been seen for an appointment in the last year OR does the patient have an upcoming appointment? Yes  Can we respond through MyChart? Yes- would like a phone call when it is called in  Agent: Please be advised that Rx refills may take up to 3 business days. We ask that you follow-up with your pharmacy.

## 2024-05-13 NOTE — Telephone Encounter (Signed)
 Requested Prescriptions  Refused Prescriptions Disp Refills   insulin  glargine (LANTUS  SOLOSTAR) 100 UNIT/ML Solostar Pen 15 mL PRN    Sig: Inject 30 Units into the skin 2 (two) times daily.     Endocrinology:  Diabetes - Insulins Failed - 05/13/2024  2:46 PM      Failed - HBA1C is between 0 and 7.9 and within 180 days    Hgb A1C (fingerstick)  Date Value Ref Range Status  06/04/2016 7.1 (H) <5.7 % Final    Comment:                                                                           According to the ADA Clinical Practice Recommendations for 2011, when HbA1c is used as a screening test:     >=6.5%   Diagnostic of Diabetes Mellitus            (if abnormal result is confirmed)   5.7-6.4%   Increased risk of developing Diabetes Mellitus   References:Diagnosis and Classification of Diabetes Mellitus,Diabetes Care,2011,34(Suppl 1):S62-S69 and Standards of Medical Care in         Diabetes - 2011,Diabetes Care,2011,34 (Suppl 1):S11-S61.      Hgb A1c MFr Bld  Date Value Ref Range Status  02/28/2024 9.9 (H) <5.7 % Final    Comment:    For someone without known diabetes, a hemoglobin A1c value of 6.5% or greater indicates that they may have  diabetes and this should be confirmed with a follow-up  test. . For someone with known diabetes, a value <7% indicates  that their diabetes is well controlled and a value  greater than or equal to 7% indicates suboptimal  control. A1c targets should be individualized based on  duration of diabetes, age, comorbid conditions, and  other considerations. . Currently, no consensus exists regarding use of hemoglobin A1c for diagnosis of diabetes for children. SABRA Amy - Valid encounter within last 6 months    Recent Outpatient Visits           2 months ago Leukocytosis, unspecified type   [place] [facility] Duanne, Butler DASEN, MD   2 months ago Swelling of thigh   [place] [facility]  Aletha Bene, MD   7 months ago Type 2 diabetes mellitus with stage 3a chronic kidney disease, without long-term current use of insulin  [facility])   [place] [facility] Duanne Butler DASEN, MD   8 months ago Type 2 diabetes mellitus with hyperlipidemia [facility])   [facility] Duanne Butler DASEN, MD   1 year ago Type 2 diabetes mellitus with hyperlipidemia [facility])   [place] [facility] Pickard, Butler DASEN, MD

## 2024-05-15 ENCOUNTER — Ambulatory Visit: Admitting: Family Medicine

## 2024-05-28 ENCOUNTER — Other Ambulatory Visit: Payer: Self-pay | Admitting: Family Medicine

## 2024-05-28 ENCOUNTER — Telehealth: Payer: Self-pay

## 2024-05-28 DIAGNOSIS — E1122 Type 2 diabetes mellitus with diabetic chronic kidney disease: Secondary | ICD-10-CM

## 2024-05-28 NOTE — Telephone Encounter (Signed)
 Copied from CRM #[id number]. Topic: Clinical - Prescription Issue >> May 28, 2024 12:35 PM Delon T wrote: Reason for CRM: insulin  glargine (LANTUS  SOLOSTAR) 100 UNIT/ML Solostar Pen- Rosina calling about dosage- patient states her dosage was changed to 40 units and has been taking 40 units- please call Rosina with [facility] to verify [phone]

## 2024-05-28 NOTE — Telephone Encounter (Signed)
 Copied from CRM #[id number]. Topic: Clinical - Prescription Issue >> May 28, 2024  3:20 PM Kevelyn M wrote: Reason for CRM: patient calling in because she needs ANTUS SOLOSTAR 100 UNIT/ML Solostar Pen [Pharmacy Med Name: LANTUS  SOLOSTAR 100/ML SOLN PEN-INJ]  by tonight.  Instead of 40 units she needs 45 units. This is has to be a new prescription sent to the pharmacy. Pharmacy is waiting.

## 2024-05-29 ENCOUNTER — Other Ambulatory Visit: Payer: Self-pay | Admitting: Family Medicine

## 2024-05-29 DIAGNOSIS — E1122 Type 2 diabetes mellitus with diabetic chronic kidney disease: Secondary | ICD-10-CM

## 2024-05-29 MED ORDER — LANTUS SOLOSTAR 100 UNIT/ML ~~LOC~~ SOPN: 45.0000 [IU] | PEN_INJECTOR | Freq: Every day | SUBCUTANEOUS | 5 refills | Status: DC

## 2024-05-31 ENCOUNTER — Encounter: Payer: Self-pay | Admitting: Family Medicine

## 2024-05-31 ENCOUNTER — Ambulatory Visit: Admitting: Family Medicine

## 2024-05-31 VITALS — BP 132/76 | HR 82 | Temp 97.9°F | Ht 65.0 in | Wt 183.0 lb

## 2024-05-31 DIAGNOSIS — E1122 Type 2 diabetes mellitus with diabetic chronic kidney disease: Secondary | ICD-10-CM

## 2024-05-31 DIAGNOSIS — Z23 Encounter for immunization: Secondary | ICD-10-CM

## 2024-05-31 MED ORDER — LANTUS SOLOSTAR 100 UNIT/ML ~~LOC~~ SOPN: 45.0000 [IU] | PEN_INJECTOR | Freq: Two times a day (BID) | SUBCUTANEOUS | 5 refills | Status: AC

## 2024-05-31 NOTE — Progress Notes (Signed)
 Subjective:    Patient ID: Kathryn Wall, female    DOB: March 02, 1946, 78 y.o.   MRN: [id number] 03/01/24 HgA1c is 9.9.  Currently on lantus  44 units daily.  Not taking metformin . Is on jardiance .  Reports pain and swelling in right upper lateral thigh.  Tense and sore.  No hematoma.  No visible cellulitis.  Venous us  negative for dvt.  CXR clear.  BNP normal.  Does have polyuria and urgency but on jardiance  with sugars >300.  At that time, my plan was: ncrease Lantus  to 30 units bid (60) and uptitrate in 1 weekuntil fbs <130 and 2 hr pps <180.  Given wbc >15 and polyuria and jardiance , concerned about uti.  Also concerned about possible infection in right thigh subcutaneous fat.  Start keflex  500 tid for 7 days and recheck next week.   05/31/24 Patient has not been seen since.  Patient told the nurse that she was taking 45 units of Lantus  once a day.  However when I asked the patient to verify the medicine list, she states that she is taking 45 units twice a day.  I asked her multiple times to confirm this.  She is adamant that she is taking 45 units of Lantus  twice a day.  She provides 3 days worth of sugar readings.  Her fasting blood sugars are typically between 98 and 145.  Her daily blood sugars are typically 160 in the early afternoon and around suppertime.  Her nighttime blood sugar is around 200.  Essentially she is between 100 and 200 throughout the day.  While certainly not perfect, this is acceptable given her age and mild cognitive decline.  She denies any chest pain or shortness of breath.  She denies any swelling of her legs.  Diabetic foot exam was performed today.  She is due for a flu shot  Past Medical History:  Diagnosis Date   Anxiety    Breast cancer (HCC) 06/29/1995   AGE 28, BRCA 1 NEGATIVE 2. 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    CKD (chronic kidney disease), stage III (HCC)    Cognitive deficits    mild   Depression    Diabetes mellitus    TYPE II    High cholesterol    Hypertension    PAF (paroxysmal atrial fibrillation) [facility])    Past Surgical History:  Procedure Laterality Date   ABDOMINAL HYSTERECTOMY  06/29/1983   TAH   ANKLE SURGERY Right    APPLICATION OF WOUND VAC Left 01/14/2022   Procedure: APPLICATION OF WOUND VAC;  Surgeon: Lowery Estefana RAMAN, DO;  Location: [facility] OR;  Service: Plastics;  Laterality: Left;   BREAST SURGERY  06/29/1995   RIGHT BREAST LUMPECTOMY   CHOLECYSTECTOMY  06/28/1978   COLONOSCOPY     Current Outpatient Medications on File Prior to Visit  Medication Sig Dispense Refill   acetaminophen  (TYLENOL ) 500 MG tablet Take 1,000 mg by mouth daily as needed for moderate pain, fever or headache.     amLODipine  (NORVASC ) 5 MG tablet TAKE 1 TABLET BY MOUTH ONCE A DAY 30 tablet 10   apixaban  (ELIQUIS ) 5 MG TABS tablet Take 1 tablet (5 mg total) by mouth 2 (two) times daily. 180 tablet 2   atorvastatin  (LIPITOR) 40 MG tablet TAKE ONE TABLET BY MOUTH ONCE A DAY 90 tablet 2   cephALEXin  (KEFLEX ) 500 MG capsule Take 1 capsule (500 mg total) by mouth 3 (three) times daily. 21 capsule 0   Continuous  Glucose Sensor (DEXCOM G7 SENSOR) MISC CHANGE SENSOR EVERY TEN DAYS. 3 each 12   insulin  aspart (NOVOLOG  FLEXPEN) 100 UNIT/ML FlexPen Inject 5 Units into the skin daily. Use at suppertime. 15 mL 11   insulin  glargine (LANTUS  SOLOSTAR) 100 UNIT/ML Solostar Pen Inject 45 Units into the skin daily. 15 mL 5   Insulin  Pen Needle (PEN NEEDLES) 31G X 8 MM MISC Use to administer insulin  3 times per day. 100 each 3   JARDIANCE  25 MG TABS tablet TAKE ONE TABLET BY MOUTH ONCE A DAY 90 tablet 0   losartan  (COZAAR ) 50 MG tablet TAKE ONE TABLET BY MOUTH ONCE A DAY 100 tablet 1   metoprolol  succinate (TOPROL -XL) 25 MG 24 hr tablet TAKE 1 TABLET BY MOUTH ONCE A DAY 30 tablet 10   Multiple Vitamins-Minerals (ONE A DAY WOMEN 50 PLUS) CHEW Chew 1 each by mouth daily.     MYRBETRIQ  25 MG TB24 tablet TAKE ONE TABLET BY MOUTH ONCE A DAY 90 tablet  1   ondansetron  (ZOFRAN -ODT) 4 MG disintegrating tablet Take 4 mg by mouth every 8 (eight) hours as needed for nausea or vomiting.     ondansetron  (ZOFRAN -ODT) 4 MG disintegrating tablet Take 1 tablet (4 mg total) by mouth every 8 (eight) hours as needed for nausea or vomiting. 12 tablet 0   pantoprazole  (PROTONIX ) 40 MG tablet TAKE ONE TABLET BY MOUTH EVERY MORNING 90 tablet 2   PARoxetine  (PAXIL ) 20 MG tablet TAKE 1 TABLET BY MOUTH ONCE A DAY 90 tablet 1   QUEtiapine  (SEROQUEL ) 50 MG tablet TAKE ONE TABLET (50 MG TOTAL) BY MOUTH DAILY. 30 tablet 0   traZODone  (DESYREL ) 50 MG tablet TAKE ONE TABLET BY MOUTH EVERY NIGHT AT BEDTIME 90 tablet 1   venlafaxine  XR (EFFEXOR -XR) 75 MG 24 hr capsule Take 1 capsule (75 mg total) by mouth daily with breakfast. 90 capsule 1   metFORMIN  (GLUCOPHAGE -XR) 500 MG 24 hr tablet TAKE FOUR TABLETS BY MOUTH ONCE DAILY WITH BREAKFAST. (Patient not taking: Reported on 05/31/2024) 360 tablet 1   No current facility-administered medications on file prior to visit.   Allergies  Allergen Reactions   Ambien  [Zolpidem  Tartrate] Other (See Comments)    Sleep walking   Cipro  [Ciprofloxacin  Hcl] Nausea Only   Cymbalta [Duloxetine Hcl] Other (See Comments)    Unknown Reaction   Other     SENSITIVE TO ANTIBIOTICS   Cardura  [Doxazosin ] Nausea And Vomiting   Percocet [Oxycodone -Acetaminophen ] Nausea And Vomiting   Social History   Socioeconomic History   Marital status: Married    Spouse name: Not on file   Number of children: Not on file   Years of education: Not on file   Highest education level: Not on file  Occupational History   Not on file  Tobacco Use   Smoking status: Never   Smokeless tobacco: Never  Vaping Use   Vaping status: Never Used  Substance and Sexual Activity   Alcohol use: No   Drug use: No   Sexual activity: Yes    Birth control/protection: Post-menopausal, Surgical    Comment: hysterectomy  Other Topics Concern   Not on file  Social  History Narrative   Not on file   Social Drivers of Health   Financial Resource Strain: Low Risk  (02/15/2024)   Overall Financial Resource Strain (CARDIA)    Difficulty of Paying Living Expenses: Not hard at all  Food Insecurity: No Food Insecurity (02/15/2024)   Hunger Vital Sign  Worried About Programme Researcher, Broadcasting/film/video in the Last Year: Never true    Ran Out of Food in the Last Year: Never true  Transportation Needs: No Transportation Needs (02/15/2024)   PRAPARE - Administrator, Civil Service (Medical): No    Lack of Transportation (Non-Medical): No  Physical Activity: Insufficiently Active (02/15/2024)   Exercise Vital Sign    Days of Exercise per Week: 3 days    Minutes of Exercise per Session: 30 min  Stress: No Stress Concern Present (02/15/2024)   [employer] of Occupational Health - Occupational Stress Questionnaire    Feeling of Stress: Not at all  Social Connections: Moderately Integrated (02/15/2024)   Social Connection and Isolation Panel    Frequency of Communication with Friends and Family: More than three times a week    Frequency of Social Gatherings with Friends and Family: Three times a week    Attends Religious Services: 1 to 4 times per year    Active Member of Clubs or Organizations: No    Attends Banker Meetings: Never    Marital Status: Married  Catering Manager Violence: Not At Risk (02/15/2024)   Humiliation, Afraid, Rape, and Kick questionnaire    Fear of Current or Ex-Partner: No    Emotionally Abused: No    Physically Abused: No    Sexually Abused: No   Family History  Problem Relation Age of Onset   Cancer Mother        COLON   Hypertension Father    Heart disease Father       Review of Systems  All other systems reviewed and are negative.      Objective:   Physical Exam Vitals reviewed.  Constitutional:      General: She is not in acute distress.    Appearance: She is well-developed. She is not diaphoretic.   HENT:     Head: Normocephalic and atraumatic.     Right Ear: External ear normal.     Left Ear: External ear normal.     Nose: Nose normal.     Mouth/Throat:     Pharynx: No oropharyngeal exudate.  Eyes:     General: No scleral icterus.       Right eye: No discharge.        Left eye: No discharge.     Conjunctiva/sclera: Conjunctivae normal.     Pupils: Pupils are equal, round, and reactive to light.  Neck:     Thyroid: No thyromegaly.     Trachea: No tracheal deviation.  Cardiovascular:     Rate and Rhythm: Normal rate and regular rhythm.     Heart sounds: Normal heart sounds. No murmur heard.    No friction rub. No gallop.  Pulmonary:     Effort: Pulmonary effort is normal. No respiratory distress.     Breath sounds: Normal breath sounds. No stridor. No wheezing or rales.  Abdominal:     General: Bowel sounds are normal. There is no distension.     Palpations: Abdomen is soft. There is no mass.     Tenderness: There is no abdominal tenderness. There is no guarding or rebound.  Musculoskeletal:     Cervical back: Normal range of motion and neck supple.  Lymphadenopathy:     Cervical: No cervical adenopathy.  Skin:    General: Skin is warm.     Coloration: Skin is not pale.     Findings: No erythema or rash.  Neurological:  Mental Status: She is alert and oriented to person, place, and time.     Cranial Nerves: No cranial nerve deficit.     Motor: No abnormal muscle tone.     Coordination: Coordination normal.     Deep Tendon Reflexes: Reflexes normal.  Psychiatric:        Mood and Affect: Mood normal.        Speech: Speech normal.        Behavior: Behavior normal.        Thought Content: Thought content normal.        Judgment: Judgment normal.           Assessment & Plan:  Type 2 diabetes mellitus with stage 3a chronic kidney disease, with long-term current use of insulin  (HCC) - Plan: insulin  glargine (LANTUS  SOLOSTAR) 100 UNIT/ML Solostar Pen, CBC with  Differential/Platelet, Comprehensive metabolic panel with GFR, Hemoglobin A1c Blood pressure today is excellent.  The 3 days worth of sugar values that the patient presents to me are acceptable.  Given her situation I am happy with fasting blood sugars between 100 and 130 and 2-hour postprandial sugars between 160 and 200.  I would rather this patient have a margin of safety.  I will check her hemoglobin A1c to ensure that these values are consistent anticipate the A1c should be 7-7.5 if the sugar values are a true representation of her average sugars.  Patient received her flu shot today.  Monitor her renal function with a CMP.  Follow-up previous leukocytosis by checking a CBC.  Patient is not fasting so I cannot check her cholesterol.  I have updated her medicine list to indicate that she is taking 45 units of Lantus  twice a day

## 2024-05-31 NOTE — Addendum Note (Signed)
 Addended by: ANGELENA RONAL BRADLEY K on: 05/31/2024 04:33 PM   Modules accepted: Orders

## 2024-06-05 ENCOUNTER — Other Ambulatory Visit: Payer: Self-pay | Admitting: Family Medicine

## 2024-06-12 ENCOUNTER — Other Ambulatory Visit

## 2024-06-12 ENCOUNTER — Other Ambulatory Visit: Payer: Self-pay | Admitting: Family Medicine

## 2024-06-12 DIAGNOSIS — N1831 Chronic kidney disease, stage 3a: Secondary | ICD-10-CM

## 2024-06-13 LAB — HEMOGLOBIN A1C
Hgb A1c MFr Bld: 8.2 % — ABNORMAL HIGH (ref <5.7)
Mean Plasma Glucose: 189 mg/dL
eAG (mmol/L): 10.4 mmol/L

## 2024-06-13 LAB — CBC WITH DIFFERENTIAL/PLATELET
Absolute Lymphocytes: 3258 {cells}/uL (ref 850–3900)
Absolute Monocytes: 764 {cells}/uL (ref 200–950)
Basophils Absolute: 100 {cells}/uL (ref 0–200)
Basophils Relative: 1.1 %
Eosinophils Absolute: 501 {cells}/uL — ABNORMAL HIGH (ref 15–500)
Eosinophils Relative: 5.5 %
HCT: 43.6 % (ref 35.9–46.0)
Hemoglobin: 13.8 g/dL (ref 11.7–15.5)
MCH: 28.2 pg (ref 27.0–33.0)
MCHC: 31.7 g/dL (ref 31.6–35.4)
MCV: 89.2 fL (ref 81.4–101.7)
MPV: 12.5 fL (ref 7.5–12.5)
Monocytes Relative: 8.4 %
Neutro Abs: 4477 {cells}/uL (ref 1500–7800)
Neutrophils Relative %: 49.2 %
Platelets: 350 Thousand/uL (ref 140–400)
RBC: 4.89 Million/uL (ref 3.80–5.10)
RDW: 13.4 % (ref 11.0–15.0)
Total Lymphocyte: 35.8 %
WBC: 9.1 Thousand/uL (ref 3.8–10.8)

## 2024-06-13 LAB — COMPREHENSIVE METABOLIC PANEL WITH GFR
AG Ratio: 1.3 (calc) (ref 1.0–2.5)
ALT: 29 U/L (ref 6–29)
AST: 28 U/L (ref 10–35)
Albumin: 4.1 g/dL (ref 3.6–5.1)
Alkaline phosphatase (APISO): 104 U/L (ref 37–153)
BUN/Creatinine Ratio: 14 (calc) (ref 6–22)
BUN: 15 mg/dL (ref 7–25)
CO2: 22 mmol/L (ref 20–32)
Calcium: 9.1 mg/dL (ref 8.6–10.4)
Chloride: 104 mmol/L (ref 98–110)
Creat: 1.11 mg/dL — ABNORMAL HIGH (ref 0.60–0.95)
Globulin: 3.1 g/dL (ref 1.9–3.7)
Glucose, Bld: 151 mg/dL — ABNORMAL HIGH (ref 65–99)
Potassium: 4.7 mmol/L (ref 3.5–5.3)
Sodium: 137 mmol/L (ref 135–146)
Total Bilirubin: 0.4 mg/dL (ref 0.2–1.2)
Total Protein: 7.2 g/dL (ref 6.1–8.1)
eGFR: 50 mL/min/1.73m2 — ABNORMAL LOW (ref >=60)

## 2024-06-14 ENCOUNTER — Other Ambulatory Visit: Payer: Self-pay

## 2024-06-14 DIAGNOSIS — Z794 Long term (current) use of insulin: Secondary | ICD-10-CM

## 2024-06-14 MED ORDER — PIOGLITAZONE HCL 30 MG PO TABS: 30.0000 mg | ORAL_TABLET | Freq: Every day | ORAL | 1 refills | Status: AC

## 2024-06-15 NOTE — Telephone Encounter (Signed)
 Patient called and reviewed lab results explaining what Actos  is for, she agrees to take this. Advised it's at [facility], sent on 06/14/24. Scheduled f/u lab test for 09/17/24.   Copied from CRM #[id number]. Topic: Clinical - Medication Question >> Jun 15, 2024 10:02 AM Victoria B wrote: Reason for CRM: patient wants to know what Actos  is and for. I gave her lab results and it was mentioned for patient to take this med, Actos  to help with Blood sugar,but she doesn't want to take it. Pease cn to discuss

## 2024-06-15 NOTE — Telephone Encounter (Signed)
 Copied from CRM #[id number]. Topic: Clinical - Lab/Test Results >> Jun 15, 2024 10:03 AM Victoria B wrote: Reason for CRM: gave patient lab results

## 2024-07-11 ENCOUNTER — Other Ambulatory Visit: Payer: Self-pay | Admitting: Family Medicine

## 2024-09-17 ENCOUNTER — Other Ambulatory Visit

## 2025-02-20 ENCOUNTER — Ambulatory Visit
